# Patient Record
Sex: Female | Born: 1970 | Race: White | Hispanic: No | Smoking: Former smoker
Health system: Southern US, Community
[De-identification: ages and names within clinical notes are randomized; demographics above are authoritative.]

## PROBLEM LIST (undated history)

## (undated) DIAGNOSIS — Z8709 Personal history of other diseases of the respiratory system: Secondary | ICD-10-CM

## (undated) DIAGNOSIS — J309 Allergic rhinitis, unspecified: Secondary | ICD-10-CM

## (undated) DIAGNOSIS — E162 Hypoglycemia, unspecified: Secondary | ICD-10-CM

## (undated) DIAGNOSIS — L309 Dermatitis, unspecified: Secondary | ICD-10-CM

## (undated) DIAGNOSIS — M4310 Spondylolisthesis, site unspecified: Secondary | ICD-10-CM

## (undated) DIAGNOSIS — M1612 Unilateral primary osteoarthritis, left hip: Secondary | ICD-10-CM

## (undated) DIAGNOSIS — M2550 Pain in unspecified joint: Secondary | ICD-10-CM

## (undated) DIAGNOSIS — G4700 Insomnia, unspecified: Secondary | ICD-10-CM

## (undated) DIAGNOSIS — L405 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, unspecified: Secondary | ICD-10-CM

## (undated) DIAGNOSIS — T7840XA Allergy, unspecified, initial encounter: Secondary | ICD-10-CM

## (undated) DIAGNOSIS — M1711 Unilateral primary osteoarthritis, right knee: Secondary | ICD-10-CM

## (undated) DIAGNOSIS — M48061 Spinal stenosis, lumbar region without neurogenic claudication: Secondary | ICD-10-CM

## (undated) DIAGNOSIS — Z973 Presence of spectacles and contact lenses: Secondary | ICD-10-CM

## (undated) DIAGNOSIS — G8929 Other chronic pain: Secondary | ICD-10-CM

## (undated) DIAGNOSIS — F32A Depression, unspecified: Secondary | ICD-10-CM

## (undated) DIAGNOSIS — F410 Panic disorder [episodic paroxysmal anxiety] without agoraphobia: Secondary | ICD-10-CM

## (undated) DIAGNOSIS — S32009K Unspecified fracture of unspecified lumbar vertebra, subsequent encounter for fracture with nonunion: Secondary | ICD-10-CM

## (undated) DIAGNOSIS — I700 Atherosclerosis of aorta: Secondary | ICD-10-CM

## (undated) DIAGNOSIS — Z981 Arthrodesis status: Secondary | ICD-10-CM

## (undated) DIAGNOSIS — J302 Other seasonal allergic rhinitis: Secondary | ICD-10-CM

## (undated) DIAGNOSIS — F332 Major depressive disorder, recurrent severe without psychotic features: Secondary | ICD-10-CM

## (undated) DIAGNOSIS — M51369 Other intervertebral disc degeneration, lumbar region without mention of lumbar back pain or lower extremity pain: Secondary | ICD-10-CM

## (undated) DIAGNOSIS — F329 Major depressive disorder, single episode, unspecified: Secondary | ICD-10-CM

## (undated) DIAGNOSIS — F419 Anxiety disorder, unspecified: Secondary | ICD-10-CM

## (undated) DIAGNOSIS — F411 Generalized anxiety disorder: Secondary | ICD-10-CM

## (undated) DIAGNOSIS — Z9189 Other specified personal risk factors, not elsewhere classified: Secondary | ICD-10-CM

## (undated) DIAGNOSIS — M75100 Unspecified rotator cuff tear or rupture of unspecified shoulder, not specified as traumatic: Secondary | ICD-10-CM

## (undated) DIAGNOSIS — R112 Nausea with vomiting, unspecified: Secondary | ICD-10-CM

## (undated) DIAGNOSIS — M5126 Other intervertebral disc displacement, lumbar region: Secondary | ICD-10-CM

## (undated) DIAGNOSIS — E559 Vitamin D deficiency, unspecified: Secondary | ICD-10-CM

## (undated) DIAGNOSIS — M1990 Unspecified osteoarthritis, unspecified site: Secondary | ICD-10-CM

## (undated) DIAGNOSIS — Z8489 Family history of other specified conditions: Secondary | ICD-10-CM

## (undated) DIAGNOSIS — M5136 Other intervertebral disc degeneration, lumbar region: Secondary | ICD-10-CM

## (undated) DIAGNOSIS — R52 Pain, unspecified: Secondary | ICD-10-CM

## (undated) DIAGNOSIS — M549 Dorsalgia, unspecified: Secondary | ICD-10-CM

## (undated) DIAGNOSIS — Z9889 Other specified postprocedural states: Secondary | ICD-10-CM

## (undated) HISTORY — DX: Other intervertebral disc degeneration, lumbar region: M51.36

## (undated) HISTORY — DX: Allergy, unspecified, initial encounter: T78.40XA

## (undated) HISTORY — PX: BACK SURGERY: SHX140

## (undated) HISTORY — DX: Unspecified rotator cuff tear or rupture of unspecified shoulder, not specified as traumatic: M75.100

## (undated) MED FILL — Medication: Fill #0 | Status: CN

---

## 2013-03-29 ENCOUNTER — Emergency Department: Payer: Self-pay | Admitting: Emergency Medicine

## 2014-07-29 ENCOUNTER — Ambulatory Visit: Payer: Self-pay | Admitting: Neurosurgery

## 2014-08-03 ENCOUNTER — Other Ambulatory Visit (HOSPITAL_COMMUNITY): Payer: Self-pay | Admitting: Neurosurgery

## 2014-08-03 DIAGNOSIS — M4310 Spondylolisthesis, site unspecified: Secondary | ICD-10-CM | POA: Insufficient documentation

## 2014-08-03 DIAGNOSIS — M5126 Other intervertebral disc displacement, lumbar region: Secondary | ICD-10-CM | POA: Insufficient documentation

## 2014-08-26 ENCOUNTER — Encounter (HOSPITAL_COMMUNITY)
Admission: RE | Admit: 2014-08-26 | Discharge: 2014-08-26 | Disposition: A | Discharge status: Home / Self Care | Payer: BLUE CROSS/BLUE SHIELD | Source: Ambulatory Visit | Attending: Hospitalist | Admitting: Hospitalist

## 2014-08-26 ENCOUNTER — Encounter (HOSPITAL_COMMUNITY): Payer: Self-pay

## 2014-08-26 DIAGNOSIS — Z01812 Encounter for preprocedural laboratory examination: Secondary | ICD-10-CM | POA: Diagnosis present

## 2014-08-26 HISTORY — DX: Major depressive disorder, single episode, unspecified: F32.9

## 2014-08-26 HISTORY — DX: Family history of other specified conditions: Z84.89

## 2014-08-26 HISTORY — DX: Hypoglycemia, unspecified: E16.2

## 2014-08-26 HISTORY — DX: Unspecified osteoarthritis, unspecified site: M19.90

## 2014-08-26 HISTORY — DX: Nausea with vomiting, unspecified: R11.2

## 2014-08-26 HISTORY — DX: Insomnia, unspecified: G47.00

## 2014-08-26 HISTORY — DX: Personal history of other diseases of the respiratory system: Z87.09

## 2014-08-26 HISTORY — DX: Vitamin D deficiency, unspecified: E55.9

## 2014-08-26 HISTORY — DX: Dermatitis, unspecified: L30.9

## 2014-08-26 HISTORY — DX: Pain in unspecified joint: M25.50

## 2014-08-26 HISTORY — DX: Anxiety disorder, unspecified: F41.9

## 2014-08-26 HISTORY — DX: Other chronic pain: G89.29

## 2014-08-26 HISTORY — DX: Other seasonal allergic rhinitis: J30.2

## 2014-08-26 HISTORY — DX: Depression, unspecified: F32.A

## 2014-08-26 HISTORY — DX: Other specified postprocedural states: Z98.890

## 2014-08-26 HISTORY — DX: Dorsalgia, unspecified: M54.9

## 2014-08-26 LAB — CBC
HEMATOCRIT: 40.1 % (ref 36.0–46.0)
HEMOGLOBIN: 13.3 g/dL (ref 12.0–15.0)
MCH: 31.9 pg (ref 26.0–34.0)
MCHC: 33.2 g/dL (ref 30.0–36.0)
MCV: 96.2 fL (ref 78.0–100.0)
Platelets: 213 10*3/uL (ref 150–400)
RBC: 4.17 MIL/uL (ref 3.87–5.11)
RDW: 13 % (ref 11.5–15.5)
WBC: 7.9 10*3/uL (ref 4.0–10.5)

## 2014-08-26 LAB — BASIC METABOLIC PANEL
ANION GAP: 8 (ref 5–15)
BUN: 6 mg/dL (ref 6–23)
CHLORIDE: 107 meq/L (ref 96–112)
CO2: 27 mmol/L (ref 19–32)
Calcium: 9.5 mg/dL (ref 8.4–10.5)
Creatinine, Ser: 0.72 mg/dL (ref 0.50–1.10)
GFR calc Af Amer: >90 mL/min (ref >90)
GFR calc non Af Amer: >90 mL/min (ref >90)
Glucose, Bld: 115 mg/dL — ABNORMAL HIGH (ref 70–99)
Potassium: 4.4 mmol/L (ref 3.5–5.1)
Sodium: 142 mmol/L (ref 135–145)

## 2014-08-26 LAB — TYPE AND SCREEN
ABO/RH(D): A POS
ANTIBODY SCREEN: NEGATIVE

## 2014-08-26 LAB — SURGICAL PCR SCREEN
MRSA, PCR: NEGATIVE
STAPHYLOCOCCUS AUREUS: POSITIVE — AB

## 2014-08-26 LAB — HCG, SERUM, QUALITATIVE: PREG SERUM: NEGATIVE

## 2014-08-26 LAB — ABO/RH: ABO/RH(D): A POS

## 2014-08-26 NOTE — Progress Notes (Signed)
Pt doesn't have a cardiologist  Denies ever having an echo/stress test/heart cath  Denies EKG or CXR in past yr  Medical MD is Dr.Mark Jeananne Rama

## 2014-08-26 NOTE — Pre-Procedure Instructions (Signed)
Jamie [name]  08/26/2014   Your procedure is scheduled on:  Wed, Jan 13 @ 9:30 AM  Report to [facility]  at  6:30 AM  Call this number if you have problems the morning of surgery: [phone]   Remember:   Do not eat food or drink liquids after midnight.   Take these medicines the morning of surgery with A SIP OF WATER: Baclofen(Lioresal),Flonase(Fluticasone),Gabapentin(Neurontin),Ativan(Lorazepam),Pain Pill(if needed),and Effexor(Venlafaxine)               No Goody's,BC's,Aleve,Aspirin,Ibuprofen,Fish Oil,or any Herbal Medications   Do not wear jewelry, make-up or nail polish.  Do not wear lotions, powders, or perfumes. You may wear deodorant.  Do not shave 48 hours prior to surgery.   Do not bring valuables to the hospital.  [facility] is not responsible                  for any belongings or valuables.               Contacts, dentures or bridgework may not be worn into surgery.  Leave suitcase in the car. After surgery it may be brought to your room.  For patients admitted to the hospital, discharge time is determined by your                treatment team.                   Special Instructions:  [facility] - Preparing for Surgery  Before surgery, you can play an important role.  Because skin is not sterile, your skin needs to be as free of germs as possible.  You can reduce the number of germs on you skin by washing with CHG (chlorahexidine gluconate) soap before surgery.  CHG is an antiseptic cleaner which kills germs and bonds with the skin to continue killing germs even after washing.  Please DO NOT use if you have an allergy to CHG or antibacterial soaps.  If your skin becomes reddened/irritated stop using the CHG and inform your nurse when you arrive at Short Stay.  Do not shave (including legs and underarms) for at least 48 hours prior to the first CHG shower.  You may shave your face.  Please follow these instructions carefully:   1.  Shower with CHG Soap  the night before surgery and the                                morning of Surgery.  2.  If you choose to wash your hair, wash your hair first as usual with your       normal shampoo.  3.  After you shampoo, rinse your hair and body thoroughly to remove the                      Shampoo.  4.  Use CHG as you would any other liquid soap.  You can apply chg directly       to the skin and wash gently with scrungie or a clean washcloth.  5.  Apply the CHG Soap to your body ONLY FROM THE NECK DOWN.        Do not use on open wounds or open sores.  Avoid contact with your eyes,       ears, mouth and genitals (private parts).  Wash genitals (private parts)  with your normal soap.  6.  Wash thoroughly, paying special attention to the area where your surgery        will be performed.  7.  Thoroughly rinse your body with warm water from the neck down.  8.  DO NOT shower/wash with your normal soap after using and rinsing off       the CHG Soap.  9.  Pat yourself dry with a clean towel.            10.  Wear clean pajamas.            11.  Place clean sheets on your bed the night of your first shower and do not        sleep with pets.  Day of Surgery  Do not apply any lotions/deoderants the morning of surgery.  Please wear clean clothes to the hospital/surgery center.     Please read over the following fact sheets that you were given: Pain Booklet, Coughing and Deep Breathing, Blood Transfusion Information, MRSA Information and Surgical Site Infection Prevention

## 2014-08-31 MED ORDER — CEFAZOLIN SODIUM-DEXTROSE 2-3 GM-% IV SOLR
2.0000 g | INTRAVENOUS | Status: AC
  Administered 2014-09-01 (×2): 2 g via INTRAVENOUS
  Filled 2014-08-31: qty 50

## 2014-08-31 NOTE — Progress Notes (Signed)
Patient notified to arrive at 06:30 verbalized understanding.

## 2014-09-01 ENCOUNTER — Encounter (HOSPITAL_COMMUNITY)
Admission: RE | Disposition: A | Discharge status: Home / Self Care | Payer: BLUE CROSS/BLUE SHIELD | Source: Ambulatory Visit | Attending: Neurosurgery

## 2014-09-01 ENCOUNTER — Inpatient Hospital Stay (HOSPITAL_COMMUNITY): Payer: BLUE CROSS/BLUE SHIELD | Admitting: Anesthesiology

## 2014-09-01 ENCOUNTER — Encounter (HOSPITAL_COMMUNITY): Payer: Self-pay | Admitting: *Deleted

## 2014-09-01 ENCOUNTER — Inpatient Hospital Stay (HOSPITAL_COMMUNITY)
Admission: RE | Admit: 2014-09-01 | Discharge: 2014-09-05 | DRG: 460 | Disposition: A | Discharge status: Home / Self Care | Payer: BLUE CROSS/BLUE SHIELD | Source: Ambulatory Visit | Attending: Neurosurgery | Admitting: Neurosurgery

## 2014-09-01 ENCOUNTER — Inpatient Hospital Stay (HOSPITAL_COMMUNITY): Payer: BLUE CROSS/BLUE SHIELD

## 2014-09-01 DIAGNOSIS — R51 Headache: Secondary | ICD-10-CM | POA: Diagnosis not present

## 2014-09-01 DIAGNOSIS — Z888 Allergy status to other drugs, medicaments and biological substances status: Secondary | ICD-10-CM

## 2014-09-01 DIAGNOSIS — L309 Dermatitis, unspecified: Secondary | ICD-10-CM | POA: Diagnosis present

## 2014-09-01 DIAGNOSIS — I959 Hypotension, unspecified: Secondary | ICD-10-CM | POA: Diagnosis not present

## 2014-09-01 DIAGNOSIS — E559 Vitamin D deficiency, unspecified: Secondary | ICD-10-CM | POA: Diagnosis present

## 2014-09-01 DIAGNOSIS — J302 Other seasonal allergic rhinitis: Secondary | ICD-10-CM | POA: Diagnosis present

## 2014-09-01 DIAGNOSIS — M4807 Spinal stenosis, lumbosacral region: Secondary | ICD-10-CM | POA: Diagnosis present

## 2014-09-01 DIAGNOSIS — M48061 Spinal stenosis, lumbar region without neurogenic claudication: Secondary | ICD-10-CM | POA: Diagnosis present

## 2014-09-01 DIAGNOSIS — F329 Major depressive disorder, single episode, unspecified: Secondary | ICD-10-CM | POA: Diagnosis present

## 2014-09-01 DIAGNOSIS — Z981 Arthrodesis status: Secondary | ICD-10-CM

## 2014-09-01 DIAGNOSIS — M5137 Other intervertebral disc degeneration, lumbosacral region: Secondary | ICD-10-CM | POA: Diagnosis present

## 2014-09-01 DIAGNOSIS — F419 Anxiety disorder, unspecified: Secondary | ICD-10-CM | POA: Diagnosis present

## 2014-09-01 DIAGNOSIS — Z91013 Allergy to seafood: Secondary | ICD-10-CM

## 2014-09-01 DIAGNOSIS — M4727 Other spondylosis with radiculopathy, lumbosacral region: Secondary | ICD-10-CM | POA: Diagnosis present

## 2014-09-01 DIAGNOSIS — M4187 Other forms of scoliosis, lumbosacral region: Secondary | ICD-10-CM | POA: Diagnosis present

## 2014-09-01 DIAGNOSIS — Z79899 Other long term (current) drug therapy: Secondary | ICD-10-CM | POA: Diagnosis not present

## 2014-09-01 DIAGNOSIS — M5136 Other intervertebral disc degeneration, lumbar region: Secondary | ICD-10-CM | POA: Diagnosis present

## 2014-09-01 DIAGNOSIS — Z720 Tobacco use: Secondary | ICD-10-CM | POA: Diagnosis not present

## 2014-09-01 DIAGNOSIS — G4700 Insomnia, unspecified: Secondary | ICD-10-CM | POA: Diagnosis present

## 2014-09-01 DIAGNOSIS — Z419 Encounter for procedure for purposes other than remedying health state, unspecified: Secondary | ICD-10-CM

## 2014-09-01 HISTORY — PX: MAXIMUM ACCESS (MAS)POSTERIOR LUMBAR INTERBODY FUSION (PLIF) 2 LEVEL: SHX6369

## 2014-09-01 SURGERY — FOR MAXIMUM ACCESS (MAS) POSTERIOR LUMBAR INTERBODY FUSION (PLIF) 2 LEVEL
Anesthesia: General | Site: Back

## 2014-09-01 MED ORDER — ACETAMINOPHEN 325 MG PO TABS
650.0000 mg | ORAL_TABLET | ORAL | Status: DC | PRN
  Administered 2014-09-03 – 2014-09-04 (×3): 650 mg via ORAL
  Filled 2014-09-01 (×3): qty 2

## 2014-09-01 MED ORDER — THROMBIN 20000 UNITS EX SOLR
CUTANEOUS | Status: DC | PRN
  Administered 2014-09-01 (×2): via TOPICAL

## 2014-09-01 MED ORDER — GLYCOPYRROLATE 0.2 MG/ML IJ SOLN
INTRAMUSCULAR | Status: AC
  Filled 2014-09-01: qty 3

## 2014-09-01 MED ORDER — ALBUMIN HUMAN 5 % IV SOLN
INTRAVENOUS | Status: DC | PRN
  Administered 2014-09-01: 11:00:00 via INTRAVENOUS

## 2014-09-01 MED ORDER — LIDOCAINE HCL (CARDIAC) 20 MG/ML IV SOLN
INTRAVENOUS | Status: AC
  Filled 2014-09-01: qty 5

## 2014-09-01 MED ORDER — HYDROMORPHONE HCL 1 MG/ML IJ SOLN
INTRAMUSCULAR | Status: AC
  Filled 2014-09-01: qty 1

## 2014-09-01 MED ORDER — MIDAZOLAM HCL 5 MG/5ML IJ SOLN
INTRAMUSCULAR | Status: DC | PRN
  Administered 2014-09-01: 2 mg via INTRAVENOUS

## 2014-09-01 MED ORDER — FENTANYL CITRATE 0.05 MG/ML IJ SOLN
INTRAMUSCULAR | Status: DC | PRN
  Administered 2014-09-01 (×2): 50 ug via INTRAVENOUS
  Administered 2014-09-01 (×2): 100 ug via INTRAVENOUS
  Administered 2014-09-01: 50 ug via INTRAVENOUS
  Administered 2014-09-01: 100 ug via INTRAVENOUS
  Administered 2014-09-01: 50 ug via INTRAVENOUS

## 2014-09-01 MED ORDER — CYCLOBENZAPRINE HCL 10 MG PO TABS: 10.0000 mg | ORAL_TABLET | Freq: Three times a day (TID) | ORAL | Status: DC | PRN

## 2014-09-01 MED ORDER — HYDROMORPHONE HCL 1 MG/ML IJ SOLN
0.2500 mg | INTRAMUSCULAR | Status: DC | PRN
  Administered 2014-09-01 (×4): 0.5 mg via INTRAVENOUS

## 2014-09-01 MED ORDER — NEOSTIGMINE METHYLSULFATE 10 MG/10ML IV SOLN
INTRAVENOUS | Status: AC
  Filled 2014-09-01: qty 1

## 2014-09-01 MED ORDER — HYDROMORPHONE HCL 1 MG/ML IJ SOLN
0.5000 mg | INTRAMUSCULAR | Status: DC | PRN
  Administered 2014-09-01 – 2014-09-04 (×12): 1 mg via INTRAVENOUS
  Filled 2014-09-01 (×12): qty 1

## 2014-09-01 MED ORDER — THROMBIN 5000 UNITS EX SOLR
CUTANEOUS | Status: DC | PRN
  Administered 2014-09-01: 10:00:00 via TOPICAL

## 2014-09-01 MED ORDER — OXYCODONE-ACETAMINOPHEN 5-325 MG PO TABS
1.0000 | ORAL_TABLET | ORAL | Status: DC | PRN
  Administered 2014-09-01 – 2014-09-05 (×5): 2 via ORAL
  Filled 2014-09-01 (×8): qty 2

## 2014-09-01 MED ORDER — GABAPENTIN 800 MG PO TABS
800.0000 mg | ORAL_TABLET | Freq: Three times a day (TID) | ORAL | Status: DC
  Filled 2014-09-01 (×2): qty 1

## 2014-09-01 MED ORDER — GABAPENTIN 400 MG PO CAPS
800.0000 mg | ORAL_CAPSULE | Freq: Three times a day (TID) | ORAL | Status: DC
  Administered 2014-09-01 – 2014-09-05 (×12): 800 mg via ORAL
  Filled 2014-09-01 (×12): qty 2

## 2014-09-01 MED ORDER — ROCURONIUM BROMIDE 100 MG/10ML IV SOLN
INTRAVENOUS | Status: DC | PRN
  Administered 2014-09-01: 5 mg via INTRAVENOUS
  Administered 2014-09-01: 50 mg via INTRAVENOUS
  Administered 2014-09-01 (×2): 5 mg via INTRAVENOUS
  Administered 2014-09-01: 10 mg via INTRAVENOUS
  Administered 2014-09-01: 5 mg via INTRAVENOUS

## 2014-09-01 MED ORDER — ROCURONIUM BROMIDE 50 MG/5ML IV SOLN
INTRAVENOUS | Status: AC
  Filled 2014-09-01: qty 1

## 2014-09-01 MED ORDER — LIDOCAINE-EPINEPHRINE 1 %-1:100000 IJ SOLN
INTRAMUSCULAR | Status: DC | PRN
Start: 2014-09-01 — End: 2014-09-01
  Administered 2014-09-01: 7 mL

## 2014-09-01 MED ORDER — PROPOFOL 10 MG/ML IV BOLUS
INTRAVENOUS | Status: DC | PRN
  Administered 2014-09-01: 200 mg via INTRAVENOUS

## 2014-09-01 MED ORDER — SODIUM CHLORIDE 0.9 % IR SOLN
Status: DC | PRN
  Administered 2014-09-01: 10:00:00

## 2014-09-01 MED ORDER — MORPHINE SULFATE 30 MG PO TABS: 30.0000 mg | ORAL_TABLET | Freq: Two times a day (BID) | ORAL | Status: DC

## 2014-09-01 MED ORDER — CYCLOBENZAPRINE HCL 10 MG PO TABS
ORAL_TABLET | ORAL | Status: AC
  Administered 2014-09-01: 10 mg
  Filled 2014-09-01: qty 1

## 2014-09-01 MED ORDER — OXYCODONE HCL 5 MG PO TABS
15.0000 mg | ORAL_TABLET | ORAL | Status: DC | PRN
  Administered 2014-09-02 – 2014-09-04 (×6): 15 mg via ORAL
  Filled 2014-09-01 (×6): qty 3

## 2014-09-01 MED ORDER — ONDANSETRON HCL 4 MG/2ML IJ SOLN
INTRAMUSCULAR | Status: DC | PRN
  Administered 2014-09-01: 4 mg via INTRAVENOUS

## 2014-09-01 MED ORDER — PNEUMOCOCCAL VAC POLYVALENT 25 MCG/0.5ML IJ INJ: 0.5000 mL | INJECTION | INTRAMUSCULAR | Status: DC | PRN

## 2014-09-01 MED ORDER — MORPHINE SULFATE 15 MG PO TABS
30.0000 mg | ORAL_TABLET | Freq: Two times a day (BID) | ORAL | Status: DC
Start: 2014-09-01 — End: 2014-09-05
  Administered 2014-09-01 – 2014-09-05 (×8): 30 mg via ORAL
  Filled 2014-09-01 (×8): qty 2

## 2014-09-01 MED ORDER — CEFAZOLIN SODIUM 1-5 GM-% IV SOLN
1.0000 g | Freq: Three times a day (TID) | INTRAVENOUS | Status: AC
  Administered 2014-09-01 – 2014-09-04 (×8): 1 g via INTRAVENOUS
  Filled 2014-09-01 (×8): qty 50

## 2014-09-01 MED ORDER — LIDOCAINE HCL (CARDIAC) 20 MG/ML IV SOLN
INTRAVENOUS | Status: DC | PRN
  Administered 2014-09-01: 60 mg via INTRAVENOUS

## 2014-09-01 MED ORDER — OXYCODONE HCL 5 MG PO TABS
15.0000 mg | ORAL_TABLET | ORAL | Status: DC | PRN
  Administered 2014-09-01: 15 mg via ORAL

## 2014-09-01 MED ORDER — FLUTICASONE PROPIONATE 50 MCG/ACT NA SUSP
1.0000 | Freq: Every day | NASAL | Status: DC
  Administered 2014-09-01 – 2014-09-03 (×3): 1 via NASAL
  Filled 2014-09-01: qty 16

## 2014-09-01 MED ORDER — OXYCODONE HCL 5 MG PO TABS
ORAL_TABLET | ORAL | Status: AC
  Filled 2014-09-01: qty 3

## 2014-09-01 MED ORDER — MENTHOL 3 MG MT LOZG: 1.0000 | LOZENGE | OROMUCOSAL | Status: DC | PRN

## 2014-09-01 MED ORDER — NEOSTIGMINE METHYLSULFATE 10 MG/10ML IV SOLN
INTRAVENOUS | Status: DC | PRN
  Administered 2014-09-01: 4 mg via INTRAVENOUS

## 2014-09-01 MED ORDER — PHENOL 1.4 % MT LIQD: 1.0000 | OROMUCOSAL | Status: DC | PRN

## 2014-09-01 MED ORDER — PROPOFOL 10 MG/ML IV BOLUS
INTRAVENOUS | Status: AC
  Filled 2014-09-01: qty 20

## 2014-09-01 MED ORDER — SODIUM CHLORIDE 0.9 % IJ SOLN
3.0000 mL | INTRAMUSCULAR | Status: DC | PRN
Start: 2014-09-01 — End: 2014-09-05

## 2014-09-01 MED ORDER — 0.9 % SODIUM CHLORIDE (POUR BTL) OPTIME
TOPICAL | Status: DC | PRN
  Administered 2014-09-01: 1000 mL

## 2014-09-01 MED ORDER — SODIUM CHLORIDE 0.9 % IV SOLN
10.0000 mg | INTRAVENOUS | Status: DC | PRN
  Administered 2014-09-01: 25 ug/min via INTRAVENOUS

## 2014-09-01 MED ORDER — GLYCOPYRROLATE 0.2 MG/ML IJ SOLN
INTRAMUSCULAR | Status: DC | PRN
  Administered 2014-09-01: .6 mg via INTRAVENOUS

## 2014-09-01 MED ORDER — OXYCODONE-ACETAMINOPHEN 5-325 MG PO TABS: 1.0000 | ORAL_TABLET | ORAL | Status: DC | PRN

## 2014-09-01 MED ORDER — SODIUM CHLORIDE 0.9 % IV SOLN
250.0000 mL | INTRAVENOUS | Status: DC
  Administered 2014-09-01 – 2014-09-04 (×4): 250 mL via INTRAVENOUS

## 2014-09-01 MED ORDER — MIDAZOLAM HCL 2 MG/2ML IJ SOLN
INTRAMUSCULAR | Status: AC
  Filled 2014-09-01: qty 2

## 2014-09-01 MED ORDER — ONDANSETRON HCL 4 MG/2ML IJ SOLN: 4.0000 mg | INTRAMUSCULAR | Status: DC | PRN

## 2014-09-01 MED ORDER — SODIUM CHLORIDE 0.9 % IJ SOLN
3.0000 mL | Freq: Two times a day (BID) | INTRAMUSCULAR | Status: DC
  Administered 2014-09-01 – 2014-09-03 (×4): 3 mL via INTRAVENOUS

## 2014-09-01 MED ORDER — ONDANSETRON HCL 4 MG/2ML IJ SOLN
INTRAMUSCULAR | Status: AC
  Filled 2014-09-01: qty 2

## 2014-09-01 MED ORDER — VENLAFAXINE HCL ER 75 MG PO CP24
225.0000 mg | ORAL_CAPSULE | Freq: Every day | ORAL | Status: DC
  Administered 2014-09-02 – 2014-09-05 (×4): 225 mg via ORAL
  Filled 2014-09-01 (×4): qty 3

## 2014-09-01 MED ORDER — SCOPOLAMINE 1 MG/3DAYS TD PT72
MEDICATED_PATCH | TRANSDERMAL | Status: AC
  Administered 2014-09-01: 1 via TRANSDERMAL
  Filled 2014-09-01: qty 1

## 2014-09-01 MED ORDER — ONDANSETRON HCL 4 MG/2ML IJ SOLN: 4.0000 mg | Freq: Four times a day (QID) | INTRAMUSCULAR | Status: DC | PRN

## 2014-09-01 MED ORDER — FENTANYL CITRATE 0.05 MG/ML IJ SOLN
INTRAMUSCULAR | Status: AC
  Filled 2014-09-01: qty 5

## 2014-09-01 MED ORDER — BACLOFEN 10 MG PO TABS
10.0000 mg | ORAL_TABLET | Freq: Three times a day (TID) | ORAL | Status: DC
  Administered 2014-09-01 – 2014-09-05 (×12): 10 mg via ORAL
  Filled 2014-09-01 (×12): qty 1

## 2014-09-01 MED ORDER — OXYCODONE HCL 5 MG PO TABS: 15.0000 mg | ORAL_TABLET | ORAL | Status: DC | PRN

## 2014-09-01 MED ORDER — VITAMIN D 1000 UNITS PO TABS
1000.0000 [IU] | ORAL_TABLET | Freq: Every day | ORAL | Status: DC
  Administered 2014-09-01 – 2014-09-05 (×5): 1000 [IU] via ORAL
  Filled 2014-09-01 (×5): qty 1

## 2014-09-01 MED ORDER — LORAZEPAM 0.5 MG PO TABS
0.5000 mg | ORAL_TABLET | Freq: Two times a day (BID) | ORAL | Status: DC | PRN
  Administered 2014-09-02 – 2014-09-04 (×3): 0.5 mg via ORAL
  Filled 2014-09-01 (×3): qty 1

## 2014-09-01 MED ORDER — BUPIVACAINE HCL (PF) 0.25 % IJ SOLN
INTRAMUSCULAR | Status: DC | PRN
  Administered 2014-09-01: 9 mL

## 2014-09-01 MED ORDER — ACETAMINOPHEN 650 MG RE SUPP: 650.0000 mg | RECTAL | Status: DC | PRN

## 2014-09-01 MED ORDER — HYDROMORPHONE HCL 1 MG/ML IJ SOLN
0.5000 mg | INTRAMUSCULAR | Status: DC | PRN
  Administered 2014-09-01 (×2): 0.5 mg via INTRAVENOUS

## 2014-09-01 MED ORDER — VENLAFAXINE HCL 37.5 MG PO TABS: 75.0000 mg | ORAL_TABLET | Freq: Every day | ORAL | Status: DC

## 2014-09-01 MED ORDER — DOCUSATE SODIUM 100 MG PO CAPS
100.0000 mg | ORAL_CAPSULE | Freq: Two times a day (BID) | ORAL | Status: DC
Start: 2014-09-01 — End: 2014-09-05
  Administered 2014-09-01 – 2014-09-05 (×9): 100 mg via ORAL
  Filled 2014-09-01 (×9): qty 1

## 2014-09-01 MED ORDER — LACTATED RINGERS IV SOLN
INTRAVENOUS | Status: DC | PRN
  Administered 2014-09-01 (×3): via INTRAVENOUS

## 2014-09-01 SURGICAL SUPPLY — 73 items
ALLOSTEM STRIP 20MMX50MM (Tissue) ×2 IMPLANT
BAG DECANTER FOR FLEXI CONT (MISCELLANEOUS) ×2 IMPLANT
BENZOIN TINCTURE PRP APPL 2/3 (GAUZE/BANDAGES/DRESSINGS) ×2 IMPLANT
BLADE CLIPPER SURG (BLADE) IMPLANT
BLADE SURG 11 STRL SS (BLADE) ×2 IMPLANT
BONE ALLOSTEM MORSELIZED 5CC (Bone Implant) ×2 IMPLANT
BUR MATCHSTICK NEURO 3.0 LAGG (BURR) ×2 IMPLANT
CANISTER SUCT 3000ML (MISCELLANEOUS) ×2 IMPLANT
CAP LOCKING (Cap) ×6 IMPLANT
CAP LOCKING 5.5 CREO (Cap) ×6 IMPLANT
CONT SPEC 4OZ CLIKSEAL STRL BL (MISCELLANEOUS) ×4 IMPLANT
COVER BACK TABLE 24X17X13 BIG (DRAPES) IMPLANT
COVER BACK TABLE 60X90IN (DRAPES) ×2 IMPLANT
DRAPE C-ARM 42X72 X-RAY (DRAPES) ×2 IMPLANT
DRAPE C-ARMOR (DRAPES) ×2 IMPLANT
DRAPE LAPAROTOMY 100X72X124 (DRAPES) ×2 IMPLANT
DRAPE POUCH INSTRU U-SHP 10X18 (DRAPES) ×2 IMPLANT
DRAPE SURG 17X23 STRL (DRAPES) ×2 IMPLANT
DRILL BIT ×2 IMPLANT
DRSG OPSITE 4X5.5 SM (GAUZE/BANDAGES/DRESSINGS) ×2 IMPLANT
DRSG OPSITE POSTOP 4X6 (GAUZE/BANDAGES/DRESSINGS) ×2 IMPLANT
DRSG TELFA 3X8 NADH (GAUZE/BANDAGES/DRESSINGS) IMPLANT
DURAPREP 26ML APPLICATOR (WOUND CARE) ×2 IMPLANT
ELECT REM PT RETURN 9FT ADLT (ELECTROSURGICAL) ×2
ELECTRODE REM PT RTRN 9FT ADLT (ELECTROSURGICAL) ×1 IMPLANT
EVACUATOR 1/8 PVC DRAIN (DRAIN) IMPLANT
EVACUATOR 3/16  PVC DRAIN (DRAIN) ×1
EVACUATOR 3/16 PVC DRAIN (DRAIN) ×1 IMPLANT
GAUZE SPONGE 4X4 12PLY STRL (GAUZE/BANDAGES/DRESSINGS) ×2 IMPLANT
GAUZE SPONGE 4X4 16PLY XRAY LF (GAUZE/BANDAGES/DRESSINGS) IMPLANT
GLOVE BIO SURGEON STRL SZ8 (GLOVE) ×4 IMPLANT
GLOVE BIOGEL PI IND STRL 7.5 (GLOVE) ×3 IMPLANT
GLOVE BIOGEL PI INDICATOR 7.5 (GLOVE) ×3
GLOVE ECLIPSE 6.5 STRL STRAW (GLOVE) ×2 IMPLANT
GLOVE INDICATOR 8.5 STRL (GLOVE) ×4 IMPLANT
GLOVE SURG SS PI 7.0 STRL IVOR (GLOVE) ×6 IMPLANT
GOWN STRL REUS W/ TWL LRG LVL3 (GOWN DISPOSABLE) ×3 IMPLANT
GOWN STRL REUS W/ TWL XL LVL3 (GOWN DISPOSABLE) ×2 IMPLANT
GOWN STRL REUS W/TWL 2XL LVL3 (GOWN DISPOSABLE) IMPLANT
GOWN STRL REUS W/TWL LRG LVL3 (GOWN DISPOSABLE) ×3
GOWN STRL REUS W/TWL XL LVL3 (GOWN DISPOSABLE) ×2
HEMOSTAT POWDER KIT SURGIFOAM (HEMOSTASIS) ×2 IMPLANT
IMPL SPINE MCS 6.5 5.5X35 (Neuro Prosthesis/Implant) ×2 IMPLANT
IMPLANT SPINE MCS 6.5 5.5X35 (Neuro Prosthesis/Implant) ×4 IMPLANT
KIT BASIN OR (CUSTOM PROCEDURE TRAY) ×2 IMPLANT
KIT ROOM TURNOVER OR (KITS) ×2 IMPLANT
LIQUID BAND (GAUZE/BANDAGES/DRESSINGS) ×2 IMPLANT
MILL MEDIUM DISP (BLADE) ×2 IMPLANT
NEEDLE HYPO 25X1 1.5 SAFETY (NEEDLE) ×2 IMPLANT
NS IRRIG 1000ML POUR BTL (IV SOLUTION) ×2 IMPLANT
PACK LAMINECTOMY NEURO (CUSTOM PROCEDURE TRAY) ×2 IMPLANT
PAD ARMBOARD 7.5X6 YLW CONV (MISCELLANEOUS) ×6 IMPLANT
PATTIES SURGICAL 1X1 (DISPOSABLE) ×2 IMPLANT
ROD 70MM SPINAL (Rod) ×4 IMPLANT
SCREW 6.5X5.5 30MM (Screw) ×2 IMPLANT
SCREW MOD 6.0-5.0X35MM (Screw) ×4 IMPLANT
SCREW PREASSEMBLY 6.0-5.0X35 (Screw) ×4 IMPLANT
SPACER RISE 10X22 8-14MM-10 (Spacer) ×4 IMPLANT
SPACER RISE 7-13MM 10X22 (Spacer) ×4 IMPLANT
SPONGE LAP 4X18 X RAY DECT (DISPOSABLE) IMPLANT
SPONGE SURGIFOAM ABS GEL 100 (HEMOSTASIS) ×4 IMPLANT
STRIP CLOSURE SKIN 1/2X4 (GAUZE/BANDAGES/DRESSINGS) ×2 IMPLANT
SUT VIC AB 0 CT1 18XCR BRD8 (SUTURE) ×1 IMPLANT
SUT VIC AB 0 CT1 8-18 (SUTURE) ×1
SUT VIC AB 2-0 CT1 18 (SUTURE) ×2 IMPLANT
SUT VICRYL 4-0 PS2 18IN ABS (SUTURE) ×2 IMPLANT
SYR 20ML ECCENTRIC (SYRINGE) ×2 IMPLANT
TAP BONE CORT 5.0/4.0 (BIT) ×2 IMPLANT
TOWEL OR 17X24 6PK STRL BLUE (TOWEL DISPOSABLE) ×2 IMPLANT
TOWEL OR 17X26 10 PK STRL BLUE (TOWEL DISPOSABLE) ×2 IMPLANT
TRAY FOLEY CATH 14FRSI W/METER (CATHETERS) ×2 IMPLANT
TULIP CREP AMP 5.5MM (Orthopedic Implant) ×4 IMPLANT
WATER STERILE IRR 1000ML POUR (IV SOLUTION) ×2 IMPLANT

## 2014-09-01 NOTE — H&P (Signed)
Jamie [name] is an 44 y.o. female.   Chief Complaint: Back and right leg pain HPI: Patient is a very pleasant 44 year old female who had previously undergone anterior lateral fusion at L3-4 remotely at an outside institution patient did fairly well however last several weeks and months and years has had progressive worsening back and bilateral leg pain worse on the right patient been working with a pain clinic out of we [place] undergone multiple trials of injections physical therapy physician directed therapy without success. 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 Due to the patient's failure conservative treatment imaging findings and progression of clinical syndrome I recommended decompression stabilization procedure at L4-5 and L5-S1. I have extensively reviewed the risks and benefits of the operation the patient as well as perioperative course expectations of outcome and alternatives of surgery and she understood and agreed to proceed forward.  Past Medical History  Diagnosis Date  . PONV (postoperative nausea and vomiting)   . Family history of adverse reaction to anesthesia     pta dad is very hard to wake up;excessive nausea  . Depression     takes Effexor daily  . History of bronchitis     > 52yr ago  . Seasonal allergies     uses Flonase daily  . Arthritis   . Joint pain   . Chronic back pain     herniated disc/stenosis/scoliosis  . Vitamin D deficiency     takes Vit D daily  . Eczema     uses a cream daily as needed  . Anxiety     takes Ativan daily  . Hypoglycemia   . Insomnia     takes Lorazepam nightly    Past Surgical History  Procedure Laterality Date  . Back surgery  2008/2012    laminectomy 1st time and 2nd time laminectomy and fusion    History reviewed. No pertinent family history. Social History:  reports that she has been smoking.  She does not have any smokeless tobacco  history on file. She reports that she does not drink alcohol. Her drug history is not on file.  Allergies:  Allergies  Allergen Reactions  . Shellfish Allergy Hives  . Flexeril [Cyclobenzaprine] Hives    RAPID HEARTBEAT    Medications Prior to Admission  Medication Sig Dispense Refill  . baclofen (LIORESAL) 10 MG tablet Take 10 mg by mouth 3 (three) times daily.    . cholecalciferol (VITAMIN D) 1000 UNITS tablet Take 1,000 Units by mouth daily.    . clobetasol cream (TEMOVATE) 5.17 % Apply 1 application topically 2 (two) times daily.    . fluticasone (FLONASE) 50 MCG/ACT nasal spray Place 1 spray into both nostrils daily.    Marland Kitchen gabapentin (NEURONTIN) 800 MG tablet Take 800 mg by mouth 3 (three) times daily.    Marland Kitchen LORazepam (ATIVAN) 0.5 MG tablet Take 0.5 mg by mouth 2 (two) times daily as needed for anxiety.    Marland Kitchen morphine (MSIR) 30 MG tablet Take 30 mg by mouth 2 (two) times daily.    Marland Kitchen oxyCODONE (ROXICODONE) 15 MG immediate release tablet Take 15 mg by mouth every 4 (four) hours as needed for pain.    Marland Kitchen venlafaxine (EFFEXOR) 75 MG tablet Take 75 mg by mouth daily. Patient takes with 150 mg to equal 225 mg    . venlafaxine XR (EFFEXOR-XR) 150 MG 24 hr capsule Take 150 mg by mouth daily with breakfast. Patient takes with  75 mg to equal 225 mg      No results found for this or any previous visit (from the past 48 hour(s)). No results found.  Review of Systems  Constitutional: Negative.   HENT: Negative.   Eyes: Negative.   Respiratory: Negative.   Cardiovascular: Negative.   Gastrointestinal: Negative.   Genitourinary: Negative.   Musculoskeletal: Positive for back pain and joint pain.  Skin: Negative.   Neurological: Positive for tingling and sensory change.  Psychiatric/Behavioral: Negative.     Blood pressure 116/72, pulse 88, temperature 98.2 F (36.8 C), temperature source Oral, resp. rate 18, height 5\' 7"  (1.702 m), weight 78.331 kg (172 lb 11 oz), last menstrual period  07/02/2014, SpO2 100 %. Physical Exam  Constitutional: She is oriented to person, place, and time. She appears well-developed and well-nourished.  HENT:  Head: Normocephalic.  Eyes: Pupils are equal, round, and reactive to light.  Neck: Normal range of motion.  Respiratory: Effort normal.  GI: Soft. Bowel sounds are normal.  Musculoskeletal: Normal range of motion.  Neurological: She is alert and oriented to person, place, and time. She has normal strength. GCS eye subscore is 4. GCS verbal subscore is 5. GCS motor subscore is 6.  Reflex Scores:      Patellar reflexes are 0 on the right side and 0 on the left side.      Achilles reflexes are 0 on the right side and 0 on the left side. Strength is 5 out of 5 in her iliopsoas, quads, hamstrings, gastrocs, into tibialis, and EHL.     Assessment/Plan 44 year old female presents for decompression stabilization procedure at L4-5 and L5-S1.  Jamie [name] P 09/01/2014, 8:04 AM

## 2014-09-01 NOTE — Transfer of Care (Signed)
Immediate Anesthesia Transfer of Care Note  Patient: Jamie [name]  Procedure(s) Performed: Procedure(s) with comments: Lumbar four-five, Lumbar five-Sacral one Maximum Access Surgery posterior lumbar interbody fusion with interbody prosthesis posterior lateral arthrodesis posterior segmental instrumentation (N/A) - Lumbar four-five, Lumbar five-Sacral one Maximum Access Surgery posterior lumbar interbody fusion with interbody prosthesis posterior lateral arthrodesis posterior segmental instrumentation  Patient Location: PACU  Anesthesia Type:General  Level of Consciousness: awake and alert   Airway & Oxygen Therapy: Patient Spontanous Breathing and Patient connected to nasal cannula oxygen  Post-op Assessment: Report given to PACU RN and Post -op Vital signs reviewed and stable  Post vital signs: Reviewed and stable  Complications: No apparent anesthesia complications

## 2014-09-01 NOTE — Progress Notes (Signed)
1313-receiving report from [place] at this time. All prior documentation done by Noe Gens RN

## 2014-09-01 NOTE — Op Note (Signed)
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 and degenerative scoliosis L4-5 L5-S1 with bilateral L4-L5 and S1 radiculopathies  Postoperative diagnosis: Same  Procedure: Decompressive lumbar laminectomies complete medial facetectomies a radical foraminotomies L4-5 and L5-S1 and excess and requiring more work than would be needed with a standard interbody fusion  #2 posterior lumbar interbody fusion L4-5 L5-S1 using the globus rise expandable peek cages packed with locally harvested autograft mixed withallostem morsels and strips  #3 pedicle screw fixation L4-S1 using the globus Creo modular pedicle screw set  Before placement of a large Hemovac drain  Surgeon Dr. Dominica Severin Talecia Sherlin  Asst.: Dr. Ashok Pall  Anesthesia: Gen.  EBL: Minimal  History of present illness: 44 year old female with long same back pain status post L3-4 anterolateral fusion who presents with progressive deterioration degeneration at L4-5 and L5-S1 with bilateral L4-L5 and S1 radiculopathies worse on the right. Patient failed all forms of conservative treatment with pain management epidural steroid injections physical therapy and positioned directed therapy. And due to her progression of clinical syndrome imaging findings and failure conservative treatment I recommended decompression stabilization procedure at L4-5 and L5-S1. I extensively went over the risks and benefits of the operation with the patient as well as perioperative course expectations of outcome and alternatives of surgery and she understood and agreed to proceed forward.   Operative procedure: Patient brought into the or was induced under general anesthesia positioned prone on flat rolls her back was prepped and draped in routine sterile fashion preoperative x-ray localize the appropriate level so after infiltration of 10 mL lidocaine with epi a midline incision was made and Bovie light cautery was used to take  down the subcutaneous tissues and subperiosteal dissections care lamina of L4-L5 and S1 bilaterally. Interoperative x-ray confirmed the location for level so than under AP and lateral fluoroscopy pilot holes were drilled in the inferomedial aspect both pedicles of 5 and 7:00 positions then hand drilled probed and tapped with a 50 tap probed again and 6050 globus cortical modular screws 35 mm in length were placed and the pedicle at L4 directing medial laterally and superiorly. After both screws were placed and position confirmed with fluoroscopy attention second the decompression spinous processes at L4 and L5 were removed central decompression was begun complete facetectomies were performed under biting of the medial gutter and the lateral lateral aspect of the superior tickling facet allowed and indication of this of the lateral disc space marked facet arthropathy was causing severe hourglass pressure thecal sac and marked foraminal stenosis of the L4 and L5 nerve roots from arthropathy in the superior lateral aspect of the superior tickling facet of both levels. This was all aggressively under been decompress and the 4 and 5 roots S1 root was also unroofed then the disc spaces were identified or veins are coagulated disc spaces were incised bilaterally cleaned out bilaterally first working at L4-5, I placed sequential distraction with a 9 mm distractor on the patient's left side the right side which had a significant scoliotic collapse at L4-5 and cleanout pituitary rongeurs opened up the disc space with the contralateral distractor and after adequate preparation endplate and cleaning of the disc inserted a 7-13 expanded the cage packed with local autograft mixed with allostem. Expanded this approximate 3 turns which opened up that disc space on that side and decompressed the L4 foramina. Detail the distractor in a similar fashion Alfredo Martinez with a 10 distractor in place and this I used an 814 expandable  10  lordotic cage and expanded L and also having been packed with the autograft mixed. Then after all the interbody and prepared on the contralateral side bone graft was packed centrally this also sponge anteriorly the contralateral cage was inserted and eccentric expanded and a similar fashion. Then tensioned taken to the screw placement at L5 and S1 using fluoroscopy to confirm the entry point and trajectory I placed the screws straight directing slightly laterally 30-35 mm in length at L5 and S1 all screws excellent purchase I did replace the L5 screw on the left side with a slightly bigger and slightly shorter screw after AP fluoroscopy confirmed that the screw was a little bit long Her AP and lateral fluoroscopy confirmed good position of all the implants I assembled the head to the L4 modular screw. 70 no matter rods were chosen anchored in place all the foramina were then reinspected to confirm patency no further stenosis on the L4-L5 or S1 nerve roots bilaterally Gelfoam was overlaid top the dura large abductor was placed in thickness and states was maintained the wound scope was irrigated was then closed with interrupted Vicryl and running 4 septic or and the skin Dermabond benzo and Steri-Strips and sterile dressing were applied and patient recovered in stable condition. At the end of case all needle counts sponge counts were correct.

## 2014-09-01 NOTE — Anesthesia Preprocedure Evaluation (Addendum)
Anesthesia Evaluation  Patient identified by MRN, date of birth, ID band Patient awake    Reviewed: Allergy & Precautions, NPO status , Patient's Chart, lab work & pertinent test results  History of Anesthesia Complications (+) PONV and history of anesthetic complications  Airway Mallampati: II   Neck ROM: full    Dental  (+) Dental Advisory Given, Chipped   Pulmonary Current Smoker,          Cardiovascular negative cardio ROS      Neuro/Psych Anxiety Depression    GI/Hepatic   Endo/Other    Renal/GU      Musculoskeletal  (+) Arthritis -,   Abdominal   Peds  Hematology   Anesthesia Other Findings   Reproductive/Obstetrics                            Anesthesia Physical Anesthesia Plan  ASA: II  Anesthesia Plan: General   Post-op Pain Management:    Induction: Intravenous  Airway Management Planned: Oral ETT  Additional Equipment:   Intra-op Plan:   Post-operative Plan: Extubation in OR  Informed Consent: I have reviewed the patients History and Physical, chart, labs and discussed the procedure including the risks, benefits and alternatives for the proposed anesthesia with the patient or authorized representative who has indicated his/her understanding and acceptance.     Plan Discussed with: CRNA, Anesthesiologist and Surgeon  Anesthesia Plan Comments:         Anesthesia Quick Evaluation

## 2014-09-01 NOTE — Anesthesia Postprocedure Evaluation (Signed)
Anesthesia Post Note  Patient: Jamie [name]  Procedure(s) Performed: Procedure(s) (LRB): Lumbar four-five, Lumbar five-Sacral one Maximum Access Surgery posterior lumbar interbody fusion with interbody prosthesis posterior lateral arthrodesis posterior segmental instrumentation (N/A)  Anesthesia type: General  Patient location: PACU  Post pain: Pain level controlled and Adequate analgesia  Post assessment: Post-op Vital signs reviewed, Patient's Cardiovascular Status Stable, Respiratory Function Stable, Patent Airway and Pain level controlled  Last Vitals:  Filed Vitals:   09/01/14 1405  BP: 92/49  Pulse: 100  Temp:   Resp: 20    Post vital signs: Reviewed and stable  Level of consciousness: awake, alert  and oriented  Complications: No apparent anesthesia complications

## 2014-09-01 NOTE — Anesthesia Procedure Notes (Addendum)
Procedure Name: Intubation Date/Time: 09/01/2014 8:33 AM Performed by: Octavio Graves Pre-anesthesia Checklist: Patient identified, Timeout performed, Emergency Drugs available, Suction available and Patient being monitored Patient Re-evaluated:Patient Re-evaluated prior to inductionOxygen Delivery Method: Circle system utilized Preoxygenation: Pre-oxygenation with 100% oxygen Intubation Type: IV induction Ventilation: Mask ventilation without difficulty Laryngoscope Size: Miller and 2 Grade View: Grade I Tube type: Oral Tube size: 7.0 mm Airway Equipment and Method: LTA kit utilized and Stylet Placement Confirmation: ETT inserted through vocal cords under direct vision,  breath sounds checked- equal and bilateral and positive ETCO2 Secured at: 22 cm Tube secured with: Tape Dental Injury: Teeth and Oropharynx as per pre-operative assessment  Comments: IV induction Hodierene- intubation AM CRNA- front teeth with irregular surfaces prior to laryngoscopy- intubation was atraumatic teeth and mouth as preop- bilat BS Hodierene   Performed by: Octavio Graves

## 2014-09-01 NOTE — Progress Notes (Signed)
Pt in room 4N02. Dressing clean, dry, intact. Hemovac, foley catheter intact, draining. VSs stable. Pt, family oriented to room and equipment. Will continue to monitor.

## 2014-09-02 MED FILL — Sodium Chloride IV Soln 0.9%: INTRAVENOUS | Qty: 1000 | Status: AC

## 2014-09-02 MED FILL — Heparin Sodium (Porcine) Inj 1000 Unit/ML: INTRAMUSCULAR | Qty: 30 | Status: AC

## 2014-09-02 NOTE — Evaluation (Signed)
Occupational Therapy Evaluation Patient Details Name: Jamie [name] MRN: [id number] DOB: March 26, 1971 Today's Date: 09/02/2014    History of Present Illness Patient is a very pleasant 44 year old female who had previously undergone anterior lateral fusion at L3-4 remotely at an outside institution patient did fairly well however last several weeks and months and years has had progressive worsening back and bilateral leg pain worse on the right patient been working with a pain clinic out of we [place] undergone multiple trials of injections physical therapy physician directed therapy without success. 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 Due to the patient's failure conservative treatment imaging findings and progression of clinical syndrome . Patient underwent decompression stabilization procedure at L4-5 and L5-S1 09/01/2014.    Clinical Impression   Patient admitted with above. Patient independent PTA. Patient currently functioning at an overall supervision level. Please see OT problem list below. Feel patient will benefit from acute OT to increase overall independence in the areas of ADLs & functional mobility and in order to safely discharge to her parents house.      Follow Up Recommendations  No OT follow up;Supervision/Assistance - 24 hour    Equipment Recommendations  None recommended by OT    Recommendations for Other Services  None at this time     Precautions / Restrictions Precautions Precautions: Fall;Back Precaution Booklet Issued: Yes (comment) Precaution Comments: administerred back precautions handout and educated patient on back precautions Required Braces or Orthoses: Spinal Brace Spinal Brace: Lumbar corset Restrictions Weight Bearing Restrictions: No      Mobility Bed Mobility Overal bed mobility: Needs Assistance Bed Mobility: Rolling;Supine to Sit Rolling: Supervision    Supine to sit: Supervision     General bed mobility comments: using bed rails, patient with increased pain  Transfers Overall transfer level: Needs assistance Equipment used: Rolling walker (2 wheeled) Transfers: Sit to/from Stand Sit to Stand: Supervision    Balance Overall balance assessment: No apparent balance deficits (not formally assessed)     ADL Overall ADL's : Needs assistance/impaired Eating/Feeding: Independent   Grooming: Supervision/safety   Upper Body Bathing: Supervision/ safety   Lower Body Bathing: Supervison/ safety   Upper Body Dressing : Supervision/safety   Lower Body Dressing: Supervision/safety   Toilet Transfer: Supervision/safety   Toileting- Clothing Manipulation and Hygiene: Supervision/safety       Functional mobility during ADLs: Supervision/safety General ADL Comments: Patient overall supervision for ADL tasks and functional mobility. Initially, post discharge, patient will benefit from 24/7 supervision. Believe patient will reach mod I level quickly. No OT follow-up recommended, but clinically think patient will benefit from acute skilled OT to reach mod I goals.      Vision  Patient wears glasses/contacts at all times, no apparent deficits   Perception Perception Perception Tested?: No   Praxis Praxis Praxis tested?: Within functional limits    Pertinent Vitals/Pain Pain Assessment: 0-10 Pain Score: 8  Pain Location: back Pain Descriptors / Indicators: Aching;Burning;Stabbing Pain Intervention(s): Monitored during session;Repositioned;Patient requesting pain meds-RN notified     Hand Dominance Right   Extremity/Trunk Assessment Upper Extremity Assessment Upper Extremity Assessment: Overall WFL for tasks assessed   Lower Extremity Assessment Lower Extremity Assessment: Defer to PT evaluation   Cervical / Trunk Assessment Cervical / Trunk Assessment: Normal   Communication Communication Communication: No  difficulties   Cognition Arousal/Alertness: Awake/alert Behavior During Therapy: WFL for tasks assessed/performed Overall Cognitive Status: Within Functional Limits for tasks assessed  Home Living Family/patient expects to be discharged to:: Private residence Living Arrangements: Parent (mom and dad) Available Help at Discharge: Family;Available 24 hours/day Type of Home: House Home Access: Stairs to enter [employer] of Steps: 2 for front entrance, 4 for back entrance Entrance Stairs-Rails: Can reach both Home Layout: Two level Alternate Level Stairs-Number of Steps: flight Alternate Level Stairs-Rails: Can reach both Bathroom Shower/Tub: Tub/shower unit Shower/tub characteristics: Architectural technologist: Standard Bathroom Accessibility: Yes How Accessible: Accessible via walker Home Equipment: Kasandra Knudsen - single point (patient reports cane is mothers)   Additional Comments: These specifics are based on patient's parents house, patient plans to discharge there.       Prior Functioning/Environment Level of Independence: Independent             OT Diagnosis: Generalized weakness   OT Problem List: Decreased strength;Decreased activity tolerance;Pain;Decreased knowledge of precautions   OT Treatment/Interventions: Self-care/ADL training;Therapeutic exercise;Energy conservation;Therapeutic activities;Patient/family education;Balance training    OT Goals(Current goals can be found in the care plan section) Acute Rehab OT Goals Patient Stated Goal: none stated OT Goal Formulation: With patient Time For Goal Achievement: 09/13/14 Potential to Achieve Goals: Good ADL Goals Pt Will Perform Grooming: Independently;standing (with back brace) Pt Will Perform Upper Body Bathing: Independently;standing Pt Will Perform Lower Body Bathing: with modified independence;sit to/from stand Pt Will Perform Upper Body Dressing: Independently;standing Pt Will Perform  Lower Body Dressing: with modified independence;sit to/from stand Pt Will Transfer to Toilet: ambulating;with modified independence;regular height toilet Pt Will Perform Toileting - Clothing Manipulation and hygiene: with modified independence;sit to/from stand Pt Will Perform Tub/Shower Transfer: with modified independence;Shower transfer;ambulating;shower seat Additional ADL Goal #1: Patient will independently verbalize and adhere to 3/3 back precautions during ADL and functional mobility  OT Frequency: Min 2X/week   Barriers to D/C:   None known at this time          End of Session Equipment Utilized During Treatment: Gait belt;Rolling walker Nurse Communication:  (need for pain medication)  Activity Tolerance: Patient tolerated treatment well Patient left: in chair;with call bell/phone within reach   Time: 3762-8315 OT Time Calculation (min): 35 min Charges:  OT General Charges $OT Visit: 1 Procedure OT Evaluation $Initial OT Evaluation Tier I: 1 Procedure OT Treatments $Self Care/Home Management : 23-37 mins  Mckyla Deckman , MS, OTR/L, CLT  Pager: [phone]  09/02/2014, 9:32 AM

## 2014-09-02 NOTE — Evaluation (Signed)
Physical Therapy Evaluation Patient Details Name: Jamie [name] MRN: [id number] DOB: 1970/09/16 Today's Date: 09/02/2014   History of Present Illness  Patient is a very pleasant 44 year old female who had previously undergone anterior lateral fusion at L3-4 remotely at an outside institution patient did fairly well however last several weeks and months and years has had progressive worsening back and bilateral leg pain worse on the right patient been working with a pain clinic out of we [place] undergone multiple trials of injections physical therapy physician directed therapy without success. 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 Due to the patient's failure conservative treatment imaging findings and progression of clinical syndrome . Patient underwent decompression stabilization procedure at L4-5 and L5-S1 09/01/2014.   Clinical Impression  Pt admitted with above diagnosis. Pt currently with functional limitations due to the deficits listed below (see PT Problem List).  Pt will benefit from skilled PT to increase their independence and safety with mobility to allow discharge to the venue listed below.  Will follow patient acutely, but do not feel she will need any PT follow up.  Discussed stairs and pt states she is probably going to sleep on the first floor for a while.  Pt seems to be primarily limited by pain at this time.     Follow Up Recommendations No PT follow up    Equipment Recommendations  Rolling walker with 5" wheels    Recommendations for Other Services       Precautions / Restrictions Precautions Precautions: Fall;Back Precaution Booklet Issued: Yes (comment) Precaution Comments: Pt with good recall of precautions from earlier OT session Required Braces or Orthoses: Spinal Brace Spinal Brace: Lumbar corset Restrictions Weight Bearing Restrictions: No      Mobility  Bed  Mobility Overal bed mobility: Needs Assistance Bed Mobility: Sit to Sidelying Rolling: Min assist   Supine to sit: Min guard   Sit to sidelying: Min assist General bed mobility comments: Pt requiring MIN A for turning hips with rolling and to get legs up on bed with getting back to bed.  Transfers Overall transfer level: Needs assistance Equipment used: Rolling walker (2 wheeled) Transfers: Sit to/from Stand Sit to Stand: Supervision         General transfer comment: Guarded due to pain. Cues for technique.  Ambulation/Gait Ambulation/Gait assistance: Supervision;Min guard Ambulation Distance (Feet): 90 Feet Assistive device: Rolling walker (2 wheeled) Gait Pattern/deviations: Step-through pattern;Decreased step length - right;Decreased step length - left Gait velocity: decreased   General Gait Details: Pt moving slowly and guarded due to pain.  Nurse gave pain meds prior to treatment and then after gait gave her some IV pain med as well.  Stairs            Wheelchair Mobility    Modified Rankin (Stroke Patients Only)       Balance Overall balance assessment: No apparent balance deficits (not formally assessed)                                           Pertinent Vitals/Pain Pain Assessment: 0-10 Pain Score: 8  Pain Location: back Pain Descriptors / Indicators: Aching;Stabbing Pain Intervention(s): Premedicated before session;Patient requesting pain meds-RN notified;Limited activity within patient's tolerance;Utilized relaxation techniques    Home Living Family/patient expects to be discharged to:: Private residence Living Arrangements: Parent (mom and dad) Available  Help at Discharge: Family;Available 24 hours/day Type of Home: House Home Access: Stairs to enter Entrance Stairs-Rails: Can reach both Entrance Stairs-Number of Steps: 2 for front entrance, 4 for back entrance Home Layout: Two level Home Equipment: Cane - single point  (patient reports cane is mothers) Additional Comments: These specifics are based on patient's parents house, patient plans to discharge there.     Prior Function Level of Independence: Independent               Hand Dominance   Dominant Hand: Right    Extremity/Trunk Assessment   Upper Extremity Assessment: Defer to OT evaluation           Lower Extremity Assessment: Overall WFL for tasks assessed      Cervical / Trunk Assessment: Normal  Communication   Communication: No difficulties  Cognition Arousal/Alertness: Awake/alert Behavior During Therapy: WFL for tasks assessed/performed Overall Cognitive Status: Within Functional Limits for tasks assessed                      General Comments      Exercises        Assessment/Plan    PT Assessment Patient needs continued PT services  PT Diagnosis Acute pain;Difficulty walking   PT Problem List Decreased balance;Decreased mobility;Pain;Decreased knowledge of precautions  PT Treatment Interventions Functional mobility training;Therapeutic activities;Patient/family education;Gait training;Stair training;DME instruction   PT Goals (Current goals can be found in the Care Plan section) Acute Rehab PT Goals Patient Stated Goal: decrease pain PT Goal Formulation: With patient Time For Goal Achievement: 09/16/14 Potential to Achieve Goals: Good    Frequency Min 5X/week   Barriers to discharge        Co-evaluation               End of Session Equipment Utilized During Treatment: Gait belt;Back brace Activity Tolerance: Patient tolerated treatment well;Patient limited by pain Patient left: in bed;with nursing/sitter in room Nurse Communication: Mobility status         Time: 1105-1130 PT Time Calculation (min) (ACUTE ONLY): 25 min   Charges:   PT Evaluation $Initial PT Evaluation Tier I: 1 Procedure PT Treatments $Gait Training: 8-22 mins   PT G Codes:        Jamie [name]  [name] 09/02/2014, 12:38 PM

## 2014-09-02 NOTE — Progress Notes (Signed)
Chaplain refered to pt for advanced directive help. Chaplain offered prayer and emotional support. Pt father, [name], at bedside. Chaplain and pt spoke about multiple facets of pt life and explored the theme of powerlessness and themes of grief. Pt very concerned that she will lose her grandmother during her hospitalization. Chaplain will continue to follow. Chaplain informed pt of chaplain presence should she need services before follow up. Page chaplain as needed.    09/02/14 1000  Clinical Encounter Type  Visited With Patient and family together  Visit Type Initial;Spiritual support  Referral From Nurse  Spiritual Encounters  Spiritual Needs Emotional;Grief support;Prayer  Stress Factors  Patient Stress Factors Loss;Family relationships;Health changes  Jamie [name], Epifanio Lesches 09/02/2014 10:21 AM

## 2014-09-02 NOTE — Progress Notes (Signed)
Subjective: Patient reports Him better with no radicular symptoms back and hip pain bilaterally  Objective: Vital signs in last 24 hours: Temp:  [98.2 F (36.8 C)-99 F (37.2 C)] 98.4 F (36.9 C) (01/14 0529) Pulse Rate:  [84-106] 91 (01/14 0529) Resp:  [6-41] 16 (01/14 0529) BP: (90-114)/(32-55) 109/49 mmHg (01/14 0529) SpO2:  [95 %-100 %] 96 % (01/14 0529)  Intake/Output from previous day: 01/13 0701 - 01/14 0700 In: 2750 [I.V.:2500; IV Piggyback:250] Out: 3280 [Urine:2700; Drains:280; Blood:300] Intake/Output this shift:    Strength out of 5 wound clean dry and intact  Lab Results: No results for input(s): WBC, HGB, HCT, PLT in the last 72 hours. BMET No results for input(s): NA, K, CL, CO2, GLUCOSE, BUN, CREATININE, CALCIUM in the last 72 hours.  Studies/Results: Dg Lumbar Spine 2-3 Views  09/01/2014   CLINICAL DATA:  L4-5 L5-S1 MAS PLIF  EXAM: DG C-ARM 61-120 MIN; LUMBAR SPINE - 2-3 VIEW  COMPARISON:  None.  FINDINGS: Fluoro time reported as 58 seconds. Three fluoro spot films are submitted. The patient has undergone posterior fusion with intradiscal device placement from L4 through S1. Radiographic positioning of the metallic appliances is good. Surgical sponge material is present on one of the images.  IMPRESSION: There is no immediate postprocedure complication following posterior lumbar fusion and intradiscal device placement at L4-5 and L5-S1.   Electronically Signed   By: David  Martinique   On: 09/01/2014 12:48   Dg C-arm 1-60 Min  09/01/2014   CLINICAL DATA:  L4-5 L5-S1 MAS PLIF  EXAM: DG C-ARM 61-120 MIN; LUMBAR SPINE - 2-3 VIEW  COMPARISON:  None.  FINDINGS: Fluoro time reported as 58 seconds. Three fluoro spot films are submitted. The patient has undergone posterior fusion with intradiscal device placement from L4 through S1. Radiographic positioning of the metallic appliances is good. Surgical sponge material is present on one of the images.  IMPRESSION: There is no  immediate postprocedure complication following posterior lumbar fusion and intradiscal device placement at L4-5 and L5-S1.   Electronically Signed   By: David  Martinique   On: 09/01/2014 12:48    Assessment/Plan: Mobilized today with physical and occupational therapy  LOS: 1 day     Jamie [name] P 09/02/2014, 7:45 AM

## 2014-09-02 NOTE — Progress Notes (Signed)
CARE MANAGEMENT NOTE 09/02/2014  Patient:  Jamie [name], Jamie [name]   Account Number:  [id number]  Date Initiated:  09/02/2014  Documentation initiated by:  Olga Coaster  Subjective/Objective Assessment:   ADMITTED FOR SURGERY     Action/Plan:   CM FOLLOWING FOR DCP   Anticipated DC Date:  09/05/2014   Anticipated DC Plan:  AWAITING FOR PT/OT EVALS FOR DISPOSITION NEEDS     DC Planning Services  CM consult         Status of service:  In process, will continue to follow Medicare Important Message given?   (If response is "NO", the following Medicare IM given date fields will be blank)  Per UR Regulation:  Reviewed for med. necessity/level of care/duration of stay  Comments:  1/14/2016Mindi Slicker RN,BSN,MHA [phone]

## 2014-09-03 NOTE — Progress Notes (Signed)
Occupational Therapy Treatment Patient Details Name: Xylia Scherger MRN: [id number] DOB: 03/05/1971 Today's Date: 09/03/2014    History of present illness  Patient underwent decompression stabilization procedure at L4-5 and L5-S1 09/01/2014.    OT comments  Pt making steady progress. Pt stating she wants to go home tonight so she can see her grandmother before she passes away. Nsg notified. Completed all education. Pt overall s to mod I level. Ready for D/C home with intermittent S when medically stable. OT signing off.   Follow Up Recommendations  No OT follow up;Supervision - Intermittent    Equipment Recommendations  None recommended by OT    Recommendations for Other Services      Precautions / Restrictions Precautions Precautions: Back Precaution Booklet Issued: Yes (comment) Precaution Comments: able to recall 3/3 precautions Required Braces or Orthoses: Spinal Brace Spinal Brace: Lumbar corset       Mobility Bed Mobility Overal bed mobility: Modified Independent                Transfers Overall transfer level: Modified independent                    Balance Overall balance assessment: No apparent balance deficits (not formally assessed)                                 ADL                           Toilet Transfer: Modified Independent   Toileting- Clothing Manipulation and Hygiene: Supervision/safety       Functional mobility during ADLs: Modified independent General ADL Comments: Completed education for ADL with compensatory techniques and use of DME                                      Cognition   Behavior During Therapy: WFL for tasks assessed/performed Overall Cognitive Status: Within Functional Limits for tasks assessed                      Maurie Boettcher, OTR/L  [phone] 09/03/2014 Pertinent Vitals/ Pain       Pain Assessment: 0-10 Pain Score: 6  Pain Location: back Pain Descriptors /  Indicators: Aching Pain Intervention(s): Limited activity within patient's tolerance;Monitored during session;Repositioned;Ice applied  Home Living                                          Prior Functioning/Environment              Frequency       Progress Toward Goals  OT Goals(current goals can now be found in the care plan section)  Progress towards OT goals: Goals met/education completed, patient discharged from OT  Acute Rehab OT Goals Patient Stated Goal: to go home and see my grandmother OT Goal Formulation: With patient Time For Goal Achievement: 09/13/14 Potential to Achieve Goals: Good ADL Goals Pt Will Perform Grooming: Independently;standing Pt Will Perform Upper Body Bathing: Independently;standing Pt Will Perform Lower Body Bathing: with modified independence;sit to/from stand Pt Will Perform Upper Body Dressing: Independently;standing Pt Will Perform Lower Body Dressing: with modified independence;sit to/from stand Pt Will Transfer to Toilet:  ambulating;with modified independence;regular height toilet Pt Will Perform Toileting - Clothing Manipulation and hygiene: with modified independence;sit to/from stand Pt Will Perform Tub/Shower Transfer: with modified independence;Shower transfer;ambulating;shower seat Additional ADL Goal #1: Patient will independently verbalize and adhere to 3/3 back precautions during ADL and functional mobility  Plan Discharge plan remains appropriate    Co-evaluation                 End of Session Equipment Utilized During Treatment: Gait belt;Rolling walker;Back brace   Activity Tolerance Patient tolerated treatment well   Patient Left in bed;with call bell/phone within reach   Nurse Communication Mobility status        Time: 2248-2500 OT Time Calculation (min): 29 min  Charges: OT General Charges $OT Visit: 1 Procedure OT Treatments $Self Care/Home Management : 23-37  mins  Alleene Stoy,HILLARY 09/03/2014, 4:09 PM

## 2014-09-03 NOTE — Progress Notes (Signed)
Physical Therapy Treatment Patient Details Name: Jamie [name] MRN: [id number] DOB: [birth year] Today's Date: 09/03/2014    History of Present Illness Patient is a very pleasant 44 year old female who had previously undergone anterior lateral fusion at L3-4 remotely at an outside institution patient did fairly well however last several weeks and months and years has had progressive worsening back and bilateral leg pain worse on the right patient been working with a pain clinic out of we [place] undergone multiple trials of injections physical therapy physician directed therapy without success. 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 Due to the patient's failure conservative treatment imaging findings and progression of clinical syndrome . Patient underwent decompression stabilization procedure at L4-5 and L5-S1 09/01/2014.     PT Comments    Pt. Limited by pain during ambulation and very guarded. Pt. Would benefit from more gait training and stair training to increase speed if pain tolerance allows.   Follow Up Recommendations  No PT follow up     Equipment Recommendations  Rolling walker with 5" wheels    Recommendations for Other Services       Precautions / Restrictions Precautions Precautions: None Precaution Booklet Issued: Yes (comment) Precaution Comments: Pt with good recall of precautions from earlier OT session Required Braces or Orthoses: Spinal Brace Spinal Brace: Lumbar corset Restrictions Weight Bearing Restrictions: No    Mobility  Bed Mobility Overal bed mobility: Needs Assistance Bed Mobility: Sit to Sidelying Rolling: Min assist     Sit to supine: Min assist Sit to sidelying: Min assist General bed mobility comments: pt. adhered to back precautions when going from sit>supine. needed min A for LEs and trunk for alignment.  Transfers Overall transfer level: Needs  assistance Equipment used: Rolling walker (2 wheeled) Transfers: Sit to/from Stand Sit to Stand: Min guard         General transfer comment: Guarded due to pain. Cues for technique.  Ambulation/Gait Ambulation/Gait assistance: Supervision;Min guard Ambulation Distance (Feet): 90 Feet Assistive device: Rolling walker (2 wheeled) Gait Pattern/deviations: Narrow base of support;Step-through pattern;Decreased step length - right;Decreased step length - left Gait velocity: decreased Gait velocity interpretation: Below normal speed for age/gender General Gait Details: Pt. moving slowly and guarded due to pain. Verbal cues to widen BOS and posture   Stairs Stairs: Yes Stairs assistance: Min guard Stair Management: Two rails;Forwards;Alternating pattern Number of Stairs: 6 General stair comments: Pt. moved slowly and verbal cues and demonstration to foot placement and which leg to lead up and down.  Wheelchair Mobility    Modified Rankin (Stroke Patients Only)       Balance Overall balance assessment: No apparent balance deficits (not formally assessed)                                  Cognition Arousal/Alertness: Awake/alert Behavior During Therapy: WFL for tasks assessed/performed Overall Cognitive Status: Within Functional Limits for tasks assessed                      Exercises      General Comments        Pertinent Vitals/Pain Pain Assessment: 0-10 Pain Score: 8  Pain Location: back Pain Descriptors / Indicators: Aching Pain Intervention(s): Limited activity within patient's tolerance;Monitored during session    Home Living  Prior Function            PT Goals (current goals can now be found in the care plan section) Progress towards PT goals: Progressing toward goals    Frequency  Min 5X/week    PT Plan Current plan remains appropriate    Co-evaluation             End of Session Equipment  Utilized During Treatment: Back brace Activity Tolerance: Patient tolerated treatment well;Patient limited by pain Patient left: in bed;with call bell/phone within reach;with bed alarm set     Time: 1042-1100 PT Time Calculation (min) (ACUTE ONLY): 18 min  Charges:                       G Codes:      Jodi Geralds, [place] 09/03/2014, 11:12 AM

## 2014-09-03 NOTE — Progress Notes (Signed)
Subjective: Patient reports Doing okay still a lot of back and hip pain radicular pain seems be improved  Objective: Vital signs in last 24 hours: Temp:  [98.4 F (36.9 C)-100 F (37.8 C)] 98.5 F (36.9 C) (01/15 0630) Pulse Rate:  [93-119] 99 (01/15 0355) Resp:  [16-18] 18 (01/15 0630) BP: (93-113)/(50-61) 99/50 mmHg (01/15 0630) SpO2:  [94 %-99 %] 96 % (01/15 0630) FiO2 (%):  [2 %] 2 % (01/15 0630)  Intake/Output from previous day: 01/14 0701 - 01/15 0700 In: 243 [P.O.:240; I.V.:3] Out: 243 [Drains:243] Intake/Output this shift:    Strength out of 5 wound clean dry and intact  Lab Results: No results for input(s): WBC, HGB, HCT, PLT in the last 72 hours. BMET No results for input(s): NA, K, CL, CO2, GLUCOSE, BUN, CREATININE, CALCIUM in the last 72 hours.  Studies/Results: Dg Lumbar Spine 2-3 Views  09/01/2014   CLINICAL DATA:  L4-5 L5-S1 MAS PLIF  EXAM: DG C-ARM 61-120 MIN; LUMBAR SPINE - 2-3 VIEW  COMPARISON:  None.  FINDINGS: Fluoro time reported as 58 seconds. Three fluoro spot films are submitted. The patient has undergone posterior fusion with intradiscal device placement from L4 through S1. Radiographic positioning of the metallic appliances is good. Surgical sponge material is present on one of the images.  IMPRESSION: There is no immediate postprocedure complication following posterior lumbar fusion and intradiscal device placement at L4-5 and L5-S1.   Electronically Signed   By: David  Martinique   On: 09/01/2014 12:48   Dg C-arm 1-60 Min  09/01/2014   CLINICAL DATA:  L4-5 L5-S1 MAS PLIF  EXAM: DG C-ARM 61-120 MIN; LUMBAR SPINE - 2-3 VIEW  COMPARISON:  None.  FINDINGS: Fluoro time reported as 58 seconds. Three fluoro spot films are submitted. The patient has undergone posterior fusion with intradiscal device placement from L4 through S1. Radiographic positioning of the metallic appliances is good. Surgical sponge material is present on one of the images.  IMPRESSION: There is  no immediate postprocedure complication following posterior lumbar fusion and intradiscal device placement at L4-5 and L5-S1.   Electronically Signed   By: David  Martinique   On: 09/01/2014 12:48    Assessment/Plan: Continue to mobilize with physical and occupational therapy we'll DC the drain later today  LOS: 2 days     Jamie [name] P 09/03/2014, 7:52 AM

## 2014-09-03 NOTE — Progress Notes (Signed)
Hemo Vac has break at Y connection I taped it up and wrapped a wash cloth around it. Seems to have stopped leaking and is holding charge. Will monitor.

## 2014-09-03 NOTE — Progress Notes (Signed)
Chaplain initiated follow up with pt. Chaplain and pt explored feeling around pt grandmother who is actively dying. Pt expressed that she would like chaplain support when she gets the news that her grandmother has passed. Chaplain informed pt to ask her nurse to page chaplain when this time comes. Pt and chaplain explored feelings of anxiety and loss. Pt pastor is at bedside now. Chaplain will continue to follow.   09/03/14 1200  Clinical Encounter Type  Visited With Patient  Visit Type Follow-up;Spiritual support  Spiritual Encounters  Spiritual Needs Emotional  Stress Factors  Patient Stress Factors Loss;Family relationships  Rolly Salter, Chaplain 09/03/2014 12:21 PM

## 2014-09-04 MED ORDER — SODIUM CHLORIDE 0.9 % IV BOLUS (SEPSIS)
500.0000 mL | Freq: Once | INTRAVENOUS | Status: AC
  Administered 2014-09-04: 500 mL via INTRAVENOUS

## 2014-09-04 MED ORDER — SODIUM CHLORIDE 0.9 % IV SOLN
INTRAVENOUS | Status: DC
  Administered 2014-09-04: 09:00:00 via INTRAVENOUS

## 2014-09-04 MED ORDER — KETOROLAC TROMETHAMINE 30 MG/ML IJ SOLN
30.0000 mg | Freq: Four times a day (QID) | INTRAMUSCULAR | Status: DC | PRN
  Administered 2014-09-04 – 2014-09-05 (×3): 30 mg via INTRAVENOUS
  Filled 2014-09-04 (×3): qty 1

## 2014-09-04 NOTE — Progress Notes (Signed)
Patient has complained of a head ache all night Percocet brought the head pain under control but does not help the surgical pain in her back. Patient ambulated this evening about 150 feet and this brought her head ache back. Gave her more Percocet. Temperature has doped back to 99.4 at 0433 was 100.6 at 0200 will continue to monitor.

## 2014-09-04 NOTE — Progress Notes (Signed)
Patient c/o headache unresolved and worse in any position she has tried to rest and prn meds;. notifiied MD on call; orders received for Toradol prn; patient has some relief of headache after first dose; encouraged to return to bed; decrease activity tonight per MD and will monitor relief by AM using toradol prn tonight.  Back dressing remains dry; flat and clean; no drainage.

## 2014-09-04 NOTE — Progress Notes (Signed)
Filed Vitals:   09/04/14 0030 09/04/14 0200 09/04/14 0433 09/04/14 0618  BP: 100/43 97/45 104/42 89/41  Pulse: 107 104 96 97  Temp: 98.4 F (36.9 C) 100.6 F (38.1 C) 99.4 F (37.4 C) 98.6 F (37 C)  TempSrc: Oral Oral Oral Oral  Resp: 19 20 19 20   Height:      Weight:      SpO2: 98% 96% 99% 100%    She is complaining of headache that began on the evening of January 14. She explains that her headache is present whether she is lying flat or is up, but it has been worsening. No nausea or vomiting. Dressing examined, clean and dry, intact, no underlying swelling or drainage. Has been up and ambulating. Nursing staff initiated IV fluids because of low blood pressure. Low-grade fever overnight. SCD in place.  Plan: Operative note reviewed, unclear that this is a low pressure headache, particularly since it does not seem to be necessarily positional. We'll give patient a normal saline bolus (500 mL), and continue with normal saline fusion 125 mL per hour. We'll continue to monitor.  Hosie Spangle, MD 09/04/2014, 8:03 AM

## 2014-09-04 NOTE — Progress Notes (Signed)
Pt ambulated in the hallway appx 100 ft with family, using brace and walker.  Pt tolerated ambulation well, steady gait.

## 2014-09-04 NOTE — Progress Notes (Signed)
PT Cancellation Note  Patient Details Name: Jamie [name] MRN: [id number] DOB: 1971-04-01   Cancelled Treatment:    Reason Eval/Treat Not Completed: Medical issues which prohibited therapy;Other (comment). Attempted to see patient again this afternoon however patient still complaining of increasing headache. Stated she had walked in hall just a while ago and laid back down due headache. Patient also emotional about finding out her grandmother passed. At this time patient requesting to hold on further PT until tomorrow   Jacqualyn Posey 09/04/2014, 12:53 PM

## 2014-09-04 NOTE — Progress Notes (Signed)
PT Cancellation Note  Patient Details Name: Shailey Butterbaugh MRN: [id number] DOB: [birth year]   Cancelled Treatment:    Reason Eval/Treat Not Completed: Medical issues which prohibited therapy. Patient with decreased BP and 10/10 headache occuring since last night. Will hold PT at this time and follow up later today as time allows   [place], Tonia Brooms 09/04/2014, 8:18 AM

## 2014-09-05 NOTE — Discharge Summary (Signed)
Physician Discharge Summary  Patient ID: Jamie [name] MRN: [id number] DOB/AGE: 46-14-1972 46 y.o.  Admit date: 09/01/2014 Discharge date: 09/05/2014  Admission Diagnoses:degenerative lumbar disc disease  Discharge Diagnoses:  Active Problems:   Spinal stenosis of lumbar region   Discharged Condition: ambulating  Hospital Course: surgery  Consults: none  Significant Diagnostic Studies: mri  Treatments: lumbar fusion  Discharge Exam: Blood pressure 95/44, pulse 78, temperature 97.9 F (36.6 C), temperature source Oral, resp. rate 20, height 5\' 7"  (1.702 m), weight 78.331 kg (172 lb 11 oz), last menstrual period 07/02/2014, SpO2 98 %. ambulating  Disposition: home     Medication List    ASK your doctor about these medications        baclofen 10 MG tablet  Commonly known as:  LIORESAL  Take 10 mg by mouth 3 (three) times daily.     cholecalciferol 1000 UNITS tablet  Commonly known as:  VITAMIN D  Take 1,000 Units by mouth daily.     clobetasol cream 0.05 %  Commonly known as:  TEMOVATE  Apply 1 application topically 2 (two) times daily.     fluticasone 50 MCG/ACT nasal spray  Commonly known as:  FLONASE  Place 1 spray into both nostrils daily.     gabapentin 800 MG tablet  Commonly known as:  NEURONTIN  Take 800 mg by mouth 3 (three) times daily.     LORazepam 0.5 MG tablet  Commonly known as:  ATIVAN  Take 0.5 mg by mouth 2 (two) times daily as needed for anxiety.     morphine 30 MG tablet  Commonly known as:  MSIR  Take 30 mg by mouth 2 (two) times daily.     oxyCODONE 15 MG immediate release tablet  Commonly known as:  ROXICODONE  Take 15 mg by mouth every 4 (four) hours as needed for pain.     venlafaxine XR 150 MG 24 hr capsule  Commonly known as:  EFFEXOR-XR  Take 150 mg by mouth daily with breakfast. Patient takes with 75 mg to equal 225 mg     venlafaxine 75 MG tablet  Commonly known as:  EFFEXOR  Take 75 mg by mouth daily. Patient takes  with 150 mg to equal 225 mg         Signed: Kaiven Vester M 09/05/2014, 10:19 AM

## 2014-09-05 NOTE — Progress Notes (Signed)
Discharge orders received, pt for discharge home today, IV D/C with dressing CDI to lower back.  D/C instructions and Rx given with verbalized understanding.  Family at bedside to assist pt with discharge. Staff brought pt downstairs via wheelchair.

## 2014-09-05 NOTE — Progress Notes (Signed)
Pt ambulated in the hallway appx 200 ft with RN, using brace and withOUT walker today.  Pt tolerated ambulation well, steady gait. Pt still c/o H/A this morning, but Toradol gives some relief.

## 2014-09-08 ENCOUNTER — Encounter (HOSPITAL_COMMUNITY): Payer: Self-pay | Admitting: Neurosurgery

## 2014-10-12 ENCOUNTER — Ambulatory Visit: Payer: Self-pay | Admitting: Neurosurgery

## 2015-01-05 DIAGNOSIS — M545 Low back pain, unspecified: Secondary | ICD-10-CM | POA: Insufficient documentation

## 2015-01-05 DIAGNOSIS — G8929 Other chronic pain: Secondary | ICD-10-CM | POA: Insufficient documentation

## 2015-01-20 ENCOUNTER — Other Ambulatory Visit: Payer: Self-pay | Admitting: Neurosurgery

## 2015-01-20 DIAGNOSIS — M4807 Spinal stenosis, lumbosacral region: Secondary | ICD-10-CM

## 2015-01-27 ENCOUNTER — Ambulatory Visit: Payer: BLUE CROSS/BLUE SHIELD | Attending: Neurosurgery

## 2015-02-01 ENCOUNTER — Ambulatory Visit
Admission: RE | Admit: 2015-02-01 | Discharge: 2015-02-01 | Disposition: A | Discharge status: Home / Self Care | Payer: BLUE CROSS/BLUE SHIELD | Source: Ambulatory Visit | Attending: Neurosurgery | Admitting: Neurosurgery

## 2015-02-01 DIAGNOSIS — M4807 Spinal stenosis, lumbosacral region: Secondary | ICD-10-CM

## 2015-02-08 ENCOUNTER — Telehealth: Payer: Self-pay | Admitting: Family Medicine

## 2015-02-08 MED ORDER — LORAZEPAM 0.5 MG PO TABS: 0.5000 mg | ORAL_TABLET | Freq: Two times a day (BID) | ORAL | Status: DC | PRN

## 2015-02-08 NOTE — Telephone Encounter (Signed)
Lorazepam .5mg  1tab BID prn

## 2015-02-08 NOTE — Telephone Encounter (Signed)
Rx called to [facility]

## 2015-02-08 NOTE — Telephone Encounter (Signed)
Pt called stated she needs a refill on Lorazepam. Pharm is [facility].  Thanks.

## 2015-02-16 ENCOUNTER — Telehealth: Payer: Self-pay | Admitting: Family Medicine

## 2015-02-16 NOTE — Telephone Encounter (Signed)
Pt came in to get info about appt because she is going to run out of medicine on the 5th. We dont have any other appts open and she wants to know if there was any way she could get a partial prescription to last until her appt on the 19th. Would like a call back at [phone] and send script to [facility]

## 2015-03-08 ENCOUNTER — Ambulatory Visit (INDEPENDENT_AMBULATORY_CARE_PROVIDER_SITE_OTHER): Payer: BLUE CROSS/BLUE SHIELD | Admitting: Family Medicine

## 2015-03-08 ENCOUNTER — Encounter: Payer: Self-pay | Admitting: Family Medicine

## 2015-03-08 VITALS — BP 108/69 | HR 91 | Temp 98.3°F | Ht 67.0 in | Wt 170.0 lb

## 2015-03-08 DIAGNOSIS — F339 Major depressive disorder, recurrent, unspecified: Secondary | ICD-10-CM | POA: Insufficient documentation

## 2015-03-08 DIAGNOSIS — F329 Major depressive disorder, single episode, unspecified: Secondary | ICD-10-CM

## 2015-03-08 DIAGNOSIS — J018 Other acute sinusitis: Secondary | ICD-10-CM

## 2015-03-08 DIAGNOSIS — F32A Depression, unspecified: Secondary | ICD-10-CM

## 2015-03-08 MED ORDER — HYDROCOD POLST-CPM POLST ER 10-8 MG/5ML PO SUER: 5.0000 mL | Freq: Two times a day (BID) | ORAL | Status: DC

## 2015-03-08 MED ORDER — LORAZEPAM 0.5 MG PO TABS: 0.5000 mg | ORAL_TABLET | Freq: Two times a day (BID) | ORAL | Status: DC | PRN

## 2015-03-08 MED ORDER — AZITHROMYCIN 250 MG PO TABS: ORAL_TABLET | ORAL | Status: DC

## 2015-03-08 NOTE — Progress Notes (Signed)
   BP 108/69 mmHg  Pulse 91  Temp(Src) 98.3 F (36.8 C)  Ht 5\' 7"  (1.702 m)  Wt 170 lb (77.111 kg)  BMI 26.62 kg/m2  SpO2 96%  LMP 03/07/2015 (Exact Date)   Subjective:    Patient ID: Jamie [name], female    DOB: 03-Aug-1971, 44 y.o.   MRN: [id number]  HPI: Jamie [name] is a 44 y.o. female  Chief Complaint  Patient presents with  . Depression  . URI   Patient with marked cough cold fever or chills been ongoing for over 2 weeks dad also with similar symptoms. No nausea vomiting or diarrhea. Has developed a productive cough. Depression nerves and anxiety stable and has appointment with psychiatry later this month. Still going through a very difficult divorce.  Relevant past medical, surgical, family and social history reviewed and updated as indicated. Interim medical history since our last visit reviewed. Allergies and medications reviewed and updated.  Review of Systems  Constitutional: Positive for fatigue. Negative for fever.  HENT: Positive for congestion, ear pain, facial swelling, rhinorrhea and sinus pressure.   Respiratory: Positive for cough.   Cardiovascular: Negative.   Gastrointestinal: Negative.     Per HPI unless specifically indicated above     Objective:    BP 108/69 mmHg  Pulse 91  Temp(Src) 98.3 F (36.8 C)  Ht 5\' 7"  (1.702 m)  Wt 170 lb (77.111 kg)  BMI 26.62 kg/m2  SpO2 96%  LMP 03/07/2015 (Exact Date)  Wt Readings from Last 3 Encounters:  03/08/15 170 lb (77.111 kg)  09/01/14 172 lb 11 oz (78.331 kg)  08/26/14 172 lb 11.2 oz (78.336 kg)    Physical Exam  Constitutional: She is oriented to person, place, and time. She appears well-developed and well-nourished. No distress.  HENT:  Head: Normocephalic and atraumatic.  Right Ear: Hearing and external ear normal.  Left Ear: Hearing and external ear normal.  Nose: Nose normal.  Mouth/Throat: Oropharyngeal exudate present.  Eyes: Conjunctivae and lids are normal. Right eye exhibits no  discharge. Left eye exhibits no discharge. No scleral icterus.  Neck: Normal range of motion. No thyromegaly present.  Cardiovascular: Normal rate, regular rhythm and normal heart sounds.   Pulmonary/Chest: Effort normal. No respiratory distress.  Rhonchi and wheezes  Musculoskeletal: Normal range of motion.  Lymphadenopathy:    She has no cervical adenopathy.  Neurological: She is alert and oriented to person, place, and time.  Skin: Skin is intact. No rash noted.  Psychiatric: She has a normal mood and affect. Her speech is normal and behavior is normal. Judgment and thought content normal. Cognition and memory are normal.        Assessment & Plan:   Problem List Items Addressed This Visit      Other   Depression   Relevant Medications   LORazepam (ATIVAN) 0.5 MG tablet    Other Visit Diagnoses    Other acute sinusitis    -  Primary    Discussed care and treatment of sinusitis use of OTC medications and prescription medications along with cough syrup limitations on using cough syrup and cautio    Relevant Medications    azithromycin (ZITHROMAX) 250 MG tablet    chlorpheniramine-HYDROcodone (TUSSIONEX PENNKINETIC ER) 10-8 MG/5ML SUER        Follow up plan: Return if symptoms worsen or fail to improve, for And regular visit.

## 2015-03-14 ENCOUNTER — Telehealth: Payer: Self-pay

## 2015-03-14 ENCOUNTER — Telehealth: Payer: Self-pay | Admitting: Family Medicine

## 2015-03-14 MED ORDER — AMOXICILLIN-POT CLAVULANATE 875-125 MG PO TABS: 1.0000 | ORAL_TABLET | Freq: Two times a day (BID) | ORAL | Status: DC

## 2015-03-14 NOTE — Telephone Encounter (Signed)
Pt called in and would like to get a refill on tussinex. Pt is aware that this has to be picked but she was unsure if she could get the refill without an appt.

## 2015-03-14 NOTE — Telephone Encounter (Signed)
Pt called stated she still feel horrible. Wants to know if more medication can be called in for her. Pt informed she would most likely need to schedule an appt. Pharm is [facility]. Please call pt with any issues. Thanks.

## 2015-03-15 MED ORDER — HYDROCOD POLST-CPM POLST ER 10-8 MG/5ML PO SUER: 5.0000 mL | Freq: Two times a day (BID) | ORAL | Status: DC

## 2015-03-15 NOTE — Telephone Encounter (Signed)
This request came in after Dr. Jeananne Rama had written another ABX and then left the office. Will you review refill of Tussionex please - [facility]

## 2015-03-15 NOTE — Telephone Encounter (Signed)
Patient will come pick up

## 2015-03-15 NOTE — Telephone Encounter (Signed)
OK for her to come pick up.

## 2015-04-18 ENCOUNTER — Telehealth: Payer: Self-pay

## 2015-04-18 ENCOUNTER — Other Ambulatory Visit: Payer: Self-pay | Admitting: Family Medicine

## 2015-04-18 MED ORDER — LORAZEPAM 0.5 MG PO TABS: 0.5000 mg | ORAL_TABLET | Freq: Two times a day (BID) | ORAL | Status: DC | PRN

## 2015-04-18 NOTE — Telephone Encounter (Signed)
Patient would like refill of Lorazepam She has appointment scheduled for 05/09/15

## 2015-05-09 ENCOUNTER — Encounter: Payer: Self-pay | Admitting: Family Medicine

## 2015-05-09 ENCOUNTER — Ambulatory Visit (INDEPENDENT_AMBULATORY_CARE_PROVIDER_SITE_OTHER): Payer: BLUE CROSS/BLUE SHIELD | Admitting: Family Medicine

## 2015-05-09 VITALS — BP 101/67 | HR 90 | Temp 98.4°F | Ht 67.7 in | Wt 178.0 lb

## 2015-05-09 DIAGNOSIS — F419 Anxiety disorder, unspecified: Secondary | ICD-10-CM | POA: Insufficient documentation

## 2015-05-09 DIAGNOSIS — Z23 Encounter for immunization: Secondary | ICD-10-CM

## 2015-05-09 DIAGNOSIS — L309 Dermatitis, unspecified: Secondary | ICD-10-CM | POA: Diagnosis not present

## 2015-05-09 MED ORDER — CLOBETASOL PROPIONATE 0.05 % EX CREA: 1.0000 "application " | TOPICAL_CREAM | Freq: Two times a day (BID) | CUTANEOUS | Status: DC

## 2015-05-09 NOTE — Progress Notes (Signed)
BP 101/67 mmHg  Pulse 90  Temp(Src) 98.4 F (36.9 C)  Ht 5' 7.7" (1.72 m)  Wt 178 lb (80.74 kg)  BMI 27.29 kg/m2  SpO2 97%  LMP 04/08/2015 (Approximate)   Subjective:    Patient ID: Jamie [name], female    DOB: 1970/12/26, 44 y.o.   MRN: [id number]  HPI: Jamie [name] is a 44 y.o. female  Chief Complaint  Patient presents with  . Follow-up    medication   patient doing much better as working a job at [employer] and doing better pain management is noticing medication taken lorazepam half a milligram twice a day most days.  For dyshidrosis also doing well with Temovate cream and wants a refill.  Relevant past medical, surgical, family and social history reviewed and updated as indicated. Interim medical history since our last visit reviewed. Allergies and medications reviewed and updated.  Review of Systems  Constitutional: Negative.   Respiratory: Negative.   Cardiovascular: Negative.     Per HPI unless specifically indicated above     Objective:    BP 101/67 mmHg  Pulse 90  Temp(Src) 98.4 F (36.9 C)  Ht 5' 7.7" (1.72 m)  Wt 178 lb (80.74 kg)  BMI 27.29 kg/m2  SpO2 97%  LMP 04/08/2015 (Approximate)  Wt Readings from Last 3 Encounters:  05/09/15 178 lb (80.74 kg)  03/08/15 170 lb (77.111 kg)  09/01/14 172 lb 11 oz (78.331 kg)    Physical Exam  Constitutional: She is oriented to person, place, and time. She appears well-developed and well-nourished. No distress.  HENT:  Head: Normocephalic and atraumatic.  Right Ear: Hearing normal.  Left Ear: Hearing normal.  Nose: Nose normal.  Eyes: Conjunctivae and lids are normal. Right eye exhibits no discharge. Left eye exhibits no discharge. No scleral icterus.  Cardiovascular: Normal rate, regular rhythm and normal heart sounds.   Pulmonary/Chest: Effort normal and breath sounds normal. No respiratory distress.  Musculoskeletal: Normal range of motion.  Neurological: She is alert and oriented to person,  place, and time.  Skin: Skin is intact. No rash noted.  Changes of dyshidrosis  Psychiatric: She has a normal mood and affect. Her speech is normal and behavior is normal. Judgment and thought content normal. Cognition and memory are normal.    Results for orders placed or performed during the hospital encounter of 08/26/14  Surgical pcr screen  Result Value Ref Range   MRSA, PCR NEGATIVE NEGATIVE   Staphylococcus aureus POSITIVE (A) NEGATIVE  Basic metabolic panel  Result Value Ref Range   Sodium 142 135 - 145 mmol/L   Potassium 4.4 3.5 - 5.1 mmol/L   Chloride 107 96 - 112 mEq/L   CO2 27 19 - 32 mmol/L   Glucose, Bld 115 (H) 70 - 99 mg/dL   BUN 6 6 - 23 mg/dL   Creatinine, Ser 0.72 0.50 - 1.10 mg/dL   Calcium 9.5 8.4 - 10.5 mg/dL   GFR calc non Af Amer >90 >90 mL/min   GFR calc Af Amer >90 >90 mL/min   Anion gap 8 5 - 15  CBC  Result Value Ref Range   WBC 7.9 4.0 - 10.5 K/uL   RBC 4.17 3.87 - 5.11 MIL/uL   Hemoglobin 13.3 12.0 - 15.0 g/dL   HCT 40.1 36.0 - 46.0 %   MCV 96.2 78.0 - 100.0 fL   MCH 31.9 26.0 - 34.0 pg   MCHC 33.2 30.0 - 36.0 g/dL   RDW 13.0 11.5 -  15.5 %   Platelets 213 150 - 400 K/uL  hCG, serum, qualitative  Result Value Ref Range   Preg, Serum NEGATIVE NEGATIVE  Type and screen  Result Value Ref Range   ABO/RH(D) A POS    Antibody Screen NEG    Sample Expiration 09/09/2014   ABO/Rh  Result Value Ref Range   ABO/RH(D) A POS       Assessment & Plan:   Problem List Items Addressed This Visit      Musculoskeletal and Integument   Eczema - Primary     Other   Anxiety    .The current medical regimen is effective;  continue present plan and medications.       Relevant Medications   citalopram (CELEXA) 40 MG tablet       Follow up plan: Return in about 3 months (around 08/08/2015), or if symptoms worsen or fail to improve, for Physical Exam.

## 2015-05-09 NOTE — Assessment & Plan Note (Signed)
The current medical regimen is effective;  continue present plan and medications.  

## 2015-05-09 NOTE — Addendum Note (Signed)
Addended by: Wynn Maudlin on: 05/09/2015 03:27 PM   Modules accepted: Orders

## 2015-06-16 ENCOUNTER — Telehealth: Payer: Self-pay | Admitting: Family Medicine

## 2015-06-16 ENCOUNTER — Other Ambulatory Visit: Payer: Self-pay | Admitting: Family Medicine

## 2015-06-16 MED ORDER — LORAZEPAM 0.5 MG PO TABS: 0.5000 mg | ORAL_TABLET | Freq: Two times a day (BID) | ORAL | Status: DC | PRN

## 2015-06-16 NOTE — Telephone Encounter (Signed)
rx printed

## 2015-06-16 NOTE — Telephone Encounter (Signed)
Pt came by stated she needs a refill on Lorazepam. Please call when RX is ready for pick up. Thanks.

## 2015-08-04 ENCOUNTER — Other Ambulatory Visit (HOSPITAL_COMMUNITY): Payer: Self-pay | Admitting: Neurosurgery

## 2015-08-05 NOTE — Pre-Procedure Instructions (Addendum)
    Jamie [name]  08/05/2015     Your procedure is scheduled on Thursday, December 22.  Report to [facility] Admitting at 2:00 PM.               Your surgery or procedure is scheduled for 4:00 PM   Call this number if you have problems the morning of surgery:[phone]                  For any other questions, please call [phone], Monday - Friday 8 AM - 4 PM.   Remember:  Do not eat food or drink liquids after midnight Wednesday, December 21.   Take these medicines the morning of surgery with A SIP OF WATER :ARIPiprazole (ABILIFY), citalopram (CELEXA), gabapentin (NEURONTIN), levETIRAcetam (KEPPRA), morphine (KADIAN), venlafaxine XR (EFFEXOR-XR).               STOP taking Vitamins and Herbal Medications, Do Not take any Aspirin, Aspirin Products, Ibuprofen ( Advil), Naproxen (Aleve).             If blood sugar less than 70 may have 1/2 cup clear juice - apple or cranberry.  Recheck CBG in 15 minutes, if it is still less than 70 drink an additional 1/2 cup of clear juice and call pre op - [phone]. Prepare to come to hospital earlier than scheduled if instructed and bring CBG machine and Glucose tablets with you. Note what time you take fluids.   Do not wear jewelry, make-up or nail polish.  Do not wear lotions, powders, or perfumes.    Do not shave 48 hours prior to surgery.    Do not bring valuables to the hospital.  [facility] is not responsible for any belongings or valuables.  Contacts, dentures or bridgework may not be worn into surgery.  Leave your suitcase in the car.  After surgery it may be brought to your room.  For patients admitted to the hospital, discharge time will be determined by your treatment team.  Special instructions:  Review  [facility] - Preparing For Surgery.  Please read over the following fact sheets that you were given. Pain Booklet, Coughing and Deep Breathing, Blood Transfusion Information and Surgical Site Infection  Prevention

## 2015-08-08 ENCOUNTER — Encounter (HOSPITAL_COMMUNITY): Payer: Self-pay

## 2015-08-08 ENCOUNTER — Encounter (HOSPITAL_COMMUNITY)
Admission: RE | Admit: 2015-08-08 | Discharge: 2015-08-08 | Disposition: A | Discharge status: Home / Self Care | Payer: BLUE CROSS/BLUE SHIELD | Source: Ambulatory Visit | Attending: Neurosurgery | Admitting: Neurosurgery

## 2015-08-08 DIAGNOSIS — Z029 Encounter for administrative examinations, unspecified: Secondary | ICD-10-CM | POA: Insufficient documentation

## 2015-08-08 LAB — BASIC METABOLIC PANEL
ANION GAP: 8 (ref 5–15)
BUN: 10 mg/dL (ref 6–20)
CO2: 24 mmol/L (ref 22–32)
Calcium: 9.5 mg/dL (ref 8.9–10.3)
Chloride: 108 mmol/L (ref 101–111)
Creatinine, Ser: 0.64 mg/dL (ref 0.44–1.00)
GFR calc Af Amer: >60 mL/min (ref >60)
GLUCOSE: 103 mg/dL — AB (ref 65–99)
POTASSIUM: 4 mmol/L (ref 3.5–5.1)
SODIUM: 140 mmol/L (ref 135–145)

## 2015-08-08 LAB — CBC
HEMATOCRIT: 43.7 % (ref 36.0–46.0)
HEMOGLOBIN: 14.6 g/dL (ref 12.0–15.0)
MCH: 32.6 pg (ref 26.0–34.0)
MCHC: 33.4 g/dL (ref 30.0–36.0)
MCV: 97.5 fL (ref 78.0–100.0)
Platelets: 293 10*3/uL (ref 150–400)
RBC: 4.48 MIL/uL (ref 3.87–5.11)
RDW: 13.7 % (ref 11.5–15.5)
WBC: 8.4 10*3/uL (ref 4.0–10.5)

## 2015-08-08 LAB — SURGICAL PCR SCREEN
MRSA, PCR: NEGATIVE
STAPHYLOCOCCUS AUREUS: NEGATIVE

## 2015-08-08 LAB — TYPE AND SCREEN
ABO/RH(D): A POS
ANTIBODY SCREEN: NEGATIVE

## 2015-08-08 LAB — HCG, SERUM, QUALITATIVE: Preg, Serum: NEGATIVE

## 2015-08-08 NOTE — Progress Notes (Signed)
Jamie [name] reports a history of Hypoglycemia.  "It hasn't been a problem in a while."  I spoke with [name] PA and was instructed to instruct patient on Hypoglycemia protocol.  Patient has access to a CBG machine and Glucose tablets.  I instructed her to eat a snack high in protein prior to going to bed. Patient states that she has "ymptoms prior to crashing."  I asked patient to check CBG and if it is less than 70 to drink 1/2 cup of apple or cranberry juice, then recheck CBG in 15 minutes.  If  CBG still less than 70 to drink additional 1/2 cup of clear juice and call the pre op number I provided to her and to be prepared to come to the hospital early. ( takes 1 hour to get to the hospital from her home.)  I instructed patient to bring CBG machine and Glucose tablets with her.  I also instructed patient to keep a record of what time she took fluid.  I verbalized understanding of information.

## 2015-08-09 ENCOUNTER — Encounter: Payer: Self-pay | Admitting: Family Medicine

## 2015-08-09 ENCOUNTER — Other Ambulatory Visit: Payer: Self-pay

## 2015-08-09 ENCOUNTER — Other Ambulatory Visit: Payer: Self-pay | Admitting: Family Medicine

## 2015-08-09 ENCOUNTER — Ambulatory Visit (INDEPENDENT_AMBULATORY_CARE_PROVIDER_SITE_OTHER): Payer: BLUE CROSS/BLUE SHIELD | Admitting: Family Medicine

## 2015-08-09 VITALS — BP 117/74 | HR 83 | Temp 98.8°F | Ht 67.0 in | Wt 172.0 lb

## 2015-08-09 DIAGNOSIS — F329 Major depressive disorder, single episode, unspecified: Secondary | ICD-10-CM | POA: Diagnosis not present

## 2015-08-09 DIAGNOSIS — L309 Dermatitis, unspecified: Secondary | ICD-10-CM | POA: Diagnosis not present

## 2015-08-09 DIAGNOSIS — Z0000 Encounter for general adult medical examination without abnormal findings: Secondary | ICD-10-CM

## 2015-08-09 DIAGNOSIS — M4806 Spinal stenosis, lumbar region: Secondary | ICD-10-CM

## 2015-08-09 DIAGNOSIS — Z124 Encounter for screening for malignant neoplasm of cervix: Secondary | ICD-10-CM

## 2015-08-09 DIAGNOSIS — Z1239 Encounter for other screening for malignant neoplasm of breast: Secondary | ICD-10-CM | POA: Diagnosis not present

## 2015-08-09 DIAGNOSIS — F32A Depression, unspecified: Secondary | ICD-10-CM

## 2015-08-09 DIAGNOSIS — M48061 Spinal stenosis, lumbar region without neurogenic claudication: Secondary | ICD-10-CM

## 2015-08-09 LAB — HM PAP SMEAR: HM Pap smear: NEGATIVE

## 2015-08-09 MED ORDER — LORAZEPAM 0.5 MG PO TABS: 0.5000 mg | ORAL_TABLET | Freq: Two times a day (BID) | ORAL | Status: DC | PRN

## 2015-08-09 NOTE — Assessment & Plan Note (Signed)
Surgery pending this week

## 2015-08-09 NOTE — Assessment & Plan Note (Signed)
The current medical regimen is effective;  continue present plan and medications.  

## 2015-08-09 NOTE — Progress Notes (Signed)
BP 117/74 mmHg  Pulse 83  Temp(Src) 98.8 F (37.1 C)  Ht 5\' 7"  (1.702 m)  Wt 172 lb (78.019 kg)  BMI 26.93 kg/m2  SpO2 99%  LMP 07/26/2015 (Approximate)   Subjective:    Patient ID: Jamie [name], female    DOB: 03-29-1971, 44 y.o.   MRN: [id number]  HPI: Jamie [name] is a 44 y.o. female  Chief Complaint  Patient presents with  . 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 Patient's dyshidrosis not getting better in spite of using clobetasol cream patient uses gloves as been to dermatology and given the same medication Nerves with Abilify and lorazepam and trazodone venlafaxine are stable at best [place] stressful with pending surgery and being in the hospital over Christmas Patient's pain control is poor but the best she can get for now especially with pending surgery.  Relevant past medical, surgical, family and social history reviewed and updated as indicated. Interim medical history since our last visit reviewed. Allergies and medications reviewed and updated.  Review of Systems  Constitutional: Negative.   HENT: Negative.   Eyes: Negative.   Respiratory: Negative.   Cardiovascular: Negative.   Gastrointestinal: Negative.   Endocrine: Negative.   Genitourinary: Negative.   Musculoskeletal: Negative.   Skin: Negative.   Allergic/Immunologic: Negative.   Neurological: Negative.   Hematological: Negative.   Psychiatric/Behavioral: Negative.     Per HPI unless specifically indicated above     Objective:    BP 117/74 mmHg  Pulse 83  Temp(Src) 98.8 F (37.1 C)  Ht 5\' 7"  (1.702 m)  Wt 172 lb (78.019 kg)  BMI 26.93 kg/m2  SpO2 99%  LMP 07/26/2015 (Approximate)  Wt Readings from Last 3 Encounters:  08/09/15 172 lb (78.019 kg)  08/08/15 175 lb 9.6 oz (79.652 kg)  05/09/15 178 lb (80.74 kg)    Physical Exam  Constitutional: She is oriented to person, place, and time. She  appears well-developed and well-nourished.  HENT:  Head: Normocephalic and atraumatic.  Right Ear: External ear normal.  Left Ear: External ear normal.  Nose: Nose normal.  Mouth/Throat: Oropharynx is clear and moist.  Eyes: Conjunctivae and EOM are normal. Pupils are equal, round, and reactive to light.  Neck: Normal range of motion. Neck supple. Carotid bruit is not present.  Cardiovascular: Normal rate, regular rhythm and normal heart sounds.   No murmur heard. Pulmonary/Chest: Effort normal and breath sounds normal. She exhibits no mass. Right breast exhibits no mass, no skin change and no tenderness. Left breast exhibits no mass, no skin change and no tenderness. Breasts are symmetrical.  Abdominal: Soft. Bowel sounds are normal. There is no hepatosplenomegaly.  Genitourinary: Vagina normal and uterus normal.  Pap smear done  Musculoskeletal: Normal range of motion.  Back pain and decreased range of motion  Neurological: She is alert and oriented to person, place, and time.  Skin: No rash noted.  Psychiatric: She has a normal mood and affect. Her behavior is normal. Judgment and thought content normal.    Results for orders placed or performed during the hospital encounter of 08/08/15  Surgical pcr screen  Result Value Ref Range   MRSA, PCR NEGATIVE NEGATIVE   Staphylococcus aureus NEGATIVE NEGATIVE  Basic metabolic panel  Result Value Ref Range   Sodium 140 135 - 145 mmol/L   Potassium 4.0 3.5 - 5.1 mmol/L   Chloride 108 101 - 111 mmol/L  CO2 24 22 - 32 mmol/L   Glucose, Bld 103 (H) 65 - 99 mg/dL   BUN 10 6 - 20 mg/dL   Creatinine, Ser 0.64 0.44 - 1.00 mg/dL   Calcium 9.5 8.9 - 10.3 mg/dL   GFR calc non Af Amer >60 >60 mL/min   GFR calc Af Amer >60 >60 mL/min   Anion gap 8 5 - 15  CBC  Result Value Ref Range   WBC 8.4 4.0 - 10.5 K/uL   RBC 4.48 3.87 - 5.11 MIL/uL   Hemoglobin 14.6 12.0 - 15.0 g/dL   HCT 43.7 36.0 - 46.0 %   MCV 97.5 78.0 - 100.0 fL   MCH 32.6  26.0 - 34.0 pg   MCHC 33.4 30.0 - 36.0 g/dL   RDW 13.7 11.5 - 15.5 %   Platelets 293 150 - 400 K/uL  hCG, serum, qualitative  Result Value Ref Range   Preg, Serum NEGATIVE NEGATIVE  Type and screen All Cardiac and thoracic surgeries, spinal fusions, myomectomies, craniotomies, colon & liver resections, total joint revisions, same day c-section with placenta previa or accreta.  Result Value Ref Range   ABO/RH(D) A POS    Antibody Screen NEG    Sample Expiration 08/22/2015    Extend sample reason NO TRANSFUSIONS OR PREGNANCY IN THE PAST 3 MONTHS       Assessment & Plan:   Problem List Items Addressed This Visit      Musculoskeletal and Integument   Eczema    Discussed care and treatment of hands will continue using cream         Other   Spinal stenosis of lumbar region    Surgery pending this week      Depression    The current medical regimen is effective;  continue present plan and medications.       Relevant Medications   LORazepam (ATIVAN) 0.5 MG tablet    Other Visit Diagnoses    Breast cancer screening    -  Primary    Relevant Orders    MM Digital Screening    PE (physical exam), annual          discuss no blood work is patient's had preoperative blood work see copy in chart also had blood work through psychiatry which is not available now but all normal.  Follow up plan: Return in about 6 months (around 02/07/2016), or if symptoms worsen or fail to improve.

## 2015-08-09 NOTE — Assessment & Plan Note (Signed)
Discussed care and treatment of hands will continue using cream

## 2015-08-10 MED ORDER — DEXAMETHASONE SODIUM PHOSPHATE 10 MG/ML IJ SOLN
10.0000 mg | INTRAMUSCULAR | Status: AC
  Administered 2015-08-11: 10 mg via INTRAVENOUS
  Filled 2015-08-10: qty 1

## 2015-08-10 MED ORDER — CEFAZOLIN SODIUM-DEXTROSE 2-3 GM-% IV SOLR
2.0000 g | INTRAVENOUS | Status: AC
  Administered 2015-08-11: 2 g via INTRAVENOUS
  Filled 2015-08-10: qty 50

## 2015-08-11 ENCOUNTER — Encounter (HOSPITAL_COMMUNITY)
Admission: RE | Disposition: A | Discharge status: Home / Self Care | Payer: Self-pay | Source: Ambulatory Visit | Attending: Neurosurgery

## 2015-08-11 ENCOUNTER — Encounter (HOSPITAL_COMMUNITY): Payer: Self-pay | Admitting: *Deleted

## 2015-08-11 ENCOUNTER — Inpatient Hospital Stay (HOSPITAL_COMMUNITY): Payer: BLUE CROSS/BLUE SHIELD

## 2015-08-11 ENCOUNTER — Inpatient Hospital Stay (HOSPITAL_COMMUNITY): Payer: BLUE CROSS/BLUE SHIELD | Admitting: Vascular Surgery

## 2015-08-11 ENCOUNTER — Inpatient Hospital Stay (HOSPITAL_COMMUNITY): Payer: BLUE CROSS/BLUE SHIELD | Admitting: Anesthesiology

## 2015-08-11 ENCOUNTER — Inpatient Hospital Stay (HOSPITAL_COMMUNITY)
Admission: RE | Admit: 2015-08-11 | Discharge: 2015-08-14 | DRG: 460 | Disposition: A | Discharge status: Home / Self Care | Payer: BLUE CROSS/BLUE SHIELD | Source: Ambulatory Visit | Attending: Neurosurgery | Admitting: Neurosurgery

## 2015-08-11 DIAGNOSIS — M960 Pseudarthrosis after fusion or arthrodesis: Secondary | ICD-10-CM | POA: Diagnosis present

## 2015-08-11 DIAGNOSIS — F17200 Nicotine dependence, unspecified, uncomplicated: Secondary | ICD-10-CM | POA: Diagnosis present

## 2015-08-11 DIAGNOSIS — G4700 Insomnia, unspecified: Secondary | ICD-10-CM | POA: Diagnosis present

## 2015-08-11 DIAGNOSIS — M549 Dorsalgia, unspecified: Secondary | ICD-10-CM | POA: Diagnosis present

## 2015-08-11 DIAGNOSIS — Z79899 Other long term (current) drug therapy: Secondary | ICD-10-CM | POA: Diagnosis not present

## 2015-08-11 DIAGNOSIS — L309 Dermatitis, unspecified: Secondary | ICD-10-CM | POA: Diagnosis present

## 2015-08-11 DIAGNOSIS — R402363 Coma scale, best motor response, obeys commands, at hospital admission: Secondary | ICD-10-CM | POA: Diagnosis present

## 2015-08-11 DIAGNOSIS — M419 Scoliosis, unspecified: Secondary | ICD-10-CM | POA: Diagnosis present

## 2015-08-11 DIAGNOSIS — E559 Vitamin D deficiency, unspecified: Secondary | ICD-10-CM | POA: Diagnosis present

## 2015-08-11 DIAGNOSIS — F410 Panic disorder [episodic paroxysmal anxiety] without agoraphobia: Secondary | ICD-10-CM | POA: Diagnosis present

## 2015-08-11 DIAGNOSIS — M1990 Unspecified osteoarthritis, unspecified site: Secondary | ICD-10-CM | POA: Diagnosis present

## 2015-08-11 DIAGNOSIS — R402143 Coma scale, eyes open, spontaneous, at hospital admission: Secondary | ICD-10-CM | POA: Diagnosis present

## 2015-08-11 DIAGNOSIS — Z888 Allergy status to other drugs, medicaments and biological substances status: Secondary | ICD-10-CM

## 2015-08-11 DIAGNOSIS — S32009K Unspecified fracture of unspecified lumbar vertebra, subsequent encounter for fracture with nonunion: Secondary | ICD-10-CM | POA: Diagnosis present

## 2015-08-11 DIAGNOSIS — R402253 Coma scale, best verbal response, oriented, at hospital admission: Secondary | ICD-10-CM | POA: Diagnosis present

## 2015-08-11 DIAGNOSIS — F329 Major depressive disorder, single episode, unspecified: Secondary | ICD-10-CM | POA: Diagnosis present

## 2015-08-11 DIAGNOSIS — Z91013 Allergy to seafood: Secondary | ICD-10-CM

## 2015-08-11 DIAGNOSIS — Z79891 Long term (current) use of opiate analgesic: Secondary | ICD-10-CM | POA: Diagnosis not present

## 2015-08-11 DIAGNOSIS — Z419 Encounter for procedure for purposes other than remedying health state, unspecified: Secondary | ICD-10-CM

## 2015-08-11 SURGERY — POSTERIOR LUMBAR FUSION 1 LEVEL
Anesthesia: General | Site: Spine Lumbar

## 2015-08-11 MED ORDER — THROMBIN 20000 UNITS EX SOLR
CUTANEOUS | Status: DC | PRN
  Administered 2015-08-11 (×2): via TOPICAL

## 2015-08-11 MED ORDER — ONDANSETRON HCL 4 MG/2ML IJ SOLN: 4.0000 mg | Freq: Four times a day (QID) | INTRAMUSCULAR | Status: DC | PRN

## 2015-08-11 MED ORDER — LORAZEPAM 0.5 MG PO TABS: 0.5000 mg | ORAL_TABLET | Freq: Two times a day (BID) | ORAL | Status: DC | PRN

## 2015-08-11 MED ORDER — MORPHINE SULFATE ER 60 MG PO CP24: 30.0000 mg | ORAL_CAPSULE | Freq: Every day | ORAL | Status: DC

## 2015-08-11 MED ORDER — LIDOCAINE HCL (CARDIAC) 20 MG/ML IV SOLN
INTRAVENOUS | Status: DC | PRN
  Administered 2015-08-11: 50 mg via INTRAVENOUS

## 2015-08-11 MED ORDER — GABAPENTIN 400 MG PO CAPS
800.0000 mg | ORAL_CAPSULE | Freq: Three times a day (TID) | ORAL | Status: DC
  Administered 2015-08-11 – 2015-08-14 (×8): 800 mg via ORAL
  Filled 2015-08-11 (×8): qty 2

## 2015-08-11 MED ORDER — BUPIVACAINE LIPOSOME 1.3 % IJ SUSP
20.0000 mL | INTRAMUSCULAR | Status: AC
  Filled 2015-08-11: qty 20

## 2015-08-11 MED ORDER — LEVETIRACETAM 250 MG PO TABS
250.0000 mg | ORAL_TABLET | Freq: Two times a day (BID) | ORAL | Status: DC
  Administered 2015-08-12 – 2015-08-14 (×5): 250 mg via ORAL
  Filled 2015-08-11 (×6): qty 1

## 2015-08-11 MED ORDER — TRAZODONE HCL 100 MG PO TABS
100.0000 mg | ORAL_TABLET | Freq: Every day | ORAL | Status: DC
  Administered 2015-08-12 – 2015-08-13 (×2): 100 mg via ORAL
  Filled 2015-08-11 (×3): qty 1

## 2015-08-11 MED ORDER — NEOSTIGMINE METHYLSULFATE 10 MG/10ML IV SOLN
INTRAVENOUS | Status: DC | PRN
  Administered 2015-08-11: 3 mg via INTRAVENOUS

## 2015-08-11 MED ORDER — VANCOMYCIN HCL 1000 MG IV SOLR
INTRAVENOUS | Status: AC
  Filled 2015-08-11: qty 1000

## 2015-08-11 MED ORDER — ACETAMINOPHEN 325 MG PO TABS: 650.0000 mg | ORAL_TABLET | ORAL | Status: DC | PRN

## 2015-08-11 MED ORDER — MIDAZOLAM HCL 2 MG/2ML IJ SOLN
INTRAMUSCULAR | Status: AC
  Filled 2015-08-11: qty 2

## 2015-08-11 MED ORDER — VENLAFAXINE HCL ER 75 MG PO CP24
150.0000 mg | ORAL_CAPSULE | Freq: Every day | ORAL | Status: DC
  Administered 2015-08-12 – 2015-08-14 (×3): 150 mg via ORAL
  Filled 2015-08-11 (×3): qty 2

## 2015-08-11 MED ORDER — ARIPIPRAZOLE 10 MG PO TABS
5.0000 mg | ORAL_TABLET | Freq: Every day | ORAL | Status: DC
  Administered 2015-08-12 – 2015-08-14 (×3): 5 mg via ORAL
  Filled 2015-08-11 (×4): qty 1

## 2015-08-11 MED ORDER — GLYCOPYRROLATE 0.2 MG/ML IJ SOLN
INTRAMUSCULAR | Status: DC | PRN
  Administered 2015-08-11: 0.6 mg via INTRAVENOUS

## 2015-08-11 MED ORDER — METHOCARBAMOL 750 MG PO TABS
750.0000 mg | ORAL_TABLET | Freq: Three times a day (TID) | ORAL | Status: DC | PRN
  Administered 2015-08-13 – 2015-08-14 (×4): 750 mg via ORAL
  Filled 2015-08-11 (×6): qty 1

## 2015-08-11 MED ORDER — PROPOFOL 10 MG/ML IV BOLUS
INTRAVENOUS | Status: DC | PRN
  Administered 2015-08-11: 40 mg via INTRAVENOUS
  Administered 2015-08-11: 160 mg via INTRAVENOUS

## 2015-08-11 MED ORDER — HYDROMORPHONE HCL 1 MG/ML IJ SOLN
INTRAMUSCULAR | Status: AC
  Filled 2015-08-11: qty 1

## 2015-08-11 MED ORDER — SODIUM CHLORIDE 0.9 % IV SOLN: 250.0000 mL | INTRAVENOUS | Status: DC

## 2015-08-11 MED ORDER — MORPHINE SULFATE ER 15 MG PO TBCR
15.0000 mg | EXTENDED_RELEASE_TABLET | Freq: Two times a day (BID) | ORAL | Status: DC
  Administered 2015-08-11 – 2015-08-14 (×6): 15 mg via ORAL
  Filled 2015-08-11 (×6): qty 1

## 2015-08-11 MED ORDER — ONDANSETRON HCL 4 MG/2ML IJ SOLN: 4.0000 mg | INTRAMUSCULAR | Status: DC | PRN

## 2015-08-11 MED ORDER — HYDROMORPHONE HCL 1 MG/ML IJ SOLN
0.5000 mg | INTRAMUSCULAR | Status: DC | PRN
  Administered 2015-08-12 – 2015-08-14 (×8): 1 mg via INTRAVENOUS
  Filled 2015-08-11 (×9): qty 1

## 2015-08-11 MED ORDER — FENTANYL CITRATE (PF) 250 MCG/5ML IJ SOLN
INTRAMUSCULAR | Status: AC
  Filled 2015-08-11: qty 5

## 2015-08-11 MED ORDER — CEFAZOLIN SODIUM-DEXTROSE 2-3 GM-% IV SOLR
2.0000 g | Freq: Three times a day (TID) | INTRAVENOUS | Status: AC
  Administered 2015-08-11 – 2015-08-13 (×5): 2 g via INTRAVENOUS
  Filled 2015-08-11 (×6): qty 50

## 2015-08-11 MED ORDER — BUPIVACAINE LIPOSOME 1.3 % IJ SUSP
INTRAMUSCULAR | Status: DC | PRN
  Administered 2015-08-11: 20 mL

## 2015-08-11 MED ORDER — OXYCODONE HCL 5 MG PO TABS
15.0000 mg | ORAL_TABLET | ORAL | Status: DC | PRN
  Administered 2015-08-12 – 2015-08-14 (×12): 15 mg via ORAL
  Filled 2015-08-11 (×13): qty 3

## 2015-08-11 MED ORDER — MIDAZOLAM HCL 5 MG/5ML IJ SOLN
INTRAMUSCULAR | Status: DC | PRN
  Administered 2015-08-11: 2 mg via INTRAVENOUS

## 2015-08-11 MED ORDER — MENTHOL 3 MG MT LOZG: 1.0000 | LOZENGE | OROMUCOSAL | Status: DC | PRN

## 2015-08-11 MED ORDER — CYCLOBENZAPRINE HCL 10 MG PO TABS
10.0000 mg | ORAL_TABLET | Freq: Three times a day (TID) | ORAL | Status: DC | PRN
Start: 2015-08-11 — End: 2015-08-14
  Filled 2015-08-11 (×2): qty 1

## 2015-08-11 MED ORDER — FENTANYL CITRATE (PF) 100 MCG/2ML IJ SOLN
INTRAMUSCULAR | Status: DC | PRN
  Administered 2015-08-11: 150 ug via INTRAVENOUS
  Administered 2015-08-11: 100 ug via INTRAVENOUS

## 2015-08-11 MED ORDER — ADULT MULTIVITAMIN W/MINERALS CH
1.0000 | ORAL_TABLET | Freq: Every day | ORAL | Status: DC
  Administered 2015-08-12 – 2015-08-14 (×3): 1 via ORAL
  Filled 2015-08-11 (×4): qty 1

## 2015-08-11 MED ORDER — VANCOMYCIN HCL 1000 MG IV SOLR
INTRAVENOUS | Status: DC | PRN
  Administered 2015-08-11: 1000 mg via TOPICAL

## 2015-08-11 MED ORDER — VITAMIN D 1000 UNITS PO TABS
1000.0000 [IU] | ORAL_TABLET | Freq: Every day | ORAL | Status: DC
  Administered 2015-08-12 – 2015-08-14 (×3): 1000 [IU] via ORAL
  Filled 2015-08-11 (×4): qty 1

## 2015-08-11 MED ORDER — SODIUM CHLORIDE 0.9 % IJ SOLN: 3.0000 mL | INTRAMUSCULAR | Status: DC | PRN

## 2015-08-11 MED ORDER — LIDOCAINE-EPINEPHRINE 1 %-1:100000 IJ SOLN
INTRAMUSCULAR | Status: DC | PRN
Start: 2015-08-11 — End: 2015-08-11
  Administered 2015-08-11: 6 mL
  Administered 2015-08-11: 10 mL

## 2015-08-11 MED ORDER — CITALOPRAM HYDROBROMIDE 40 MG PO TABS
40.0000 mg | ORAL_TABLET | Freq: Every day | ORAL | Status: DC
  Administered 2015-08-12 – 2015-08-14 (×3): 40 mg via ORAL
  Filled 2015-08-11 (×4): qty 1

## 2015-08-11 MED ORDER — SODIUM CHLORIDE 0.9 % IR SOLN
Status: DC | PRN
  Administered 2015-08-11: 18:00:00

## 2015-08-11 MED ORDER — LACTATED RINGERS IV SOLN
INTRAVENOUS | Status: DC | PRN
  Administered 2015-08-11 (×2): via INTRAVENOUS

## 2015-08-11 MED ORDER — ACETAMINOPHEN 650 MG RE SUPP: 650.0000 mg | RECTAL | Status: DC | PRN

## 2015-08-11 MED ORDER — OXYCODONE HCL 5 MG/5ML PO SOLN: 5.0000 mg | Freq: Once | ORAL | Status: AC | PRN

## 2015-08-11 MED ORDER — PHENOL 1.4 % MT LIQD: 1.0000 | OROMUCOSAL | Status: DC | PRN

## 2015-08-11 MED ORDER — FLUTICASONE PROPIONATE 50 MCG/ACT NA SUSP
1.0000 | Freq: Every day | NASAL | Status: DC | PRN
  Filled 2015-08-11: qty 16

## 2015-08-11 MED ORDER — SACCHAROMYCES BOULARDII 250 MG PO CAPS
250.0000 mg | ORAL_CAPSULE | Freq: Every day | ORAL | Status: DC
  Administered 2015-08-12 – 2015-08-14 (×3): 250 mg via ORAL
  Filled 2015-08-11 (×4): qty 1

## 2015-08-11 MED ORDER — LACTATED RINGERS IV SOLN
INTRAVENOUS | Status: DC
  Administered 2015-08-11: 15:00:00 via INTRAVENOUS

## 2015-08-11 MED ORDER — OXYCODONE HCL 5 MG PO TABS
ORAL_TABLET | ORAL | Status: AC
Start: 2015-08-11 — End: 2015-08-12
  Filled 2015-08-11: qty 1

## 2015-08-11 MED ORDER — SODIUM CHLORIDE 0.9 % IJ SOLN
3.0000 mL | Freq: Two times a day (BID) | INTRAMUSCULAR | Status: DC
  Administered 2015-08-12 – 2015-08-14 (×4): 3 mL via INTRAVENOUS

## 2015-08-11 MED ORDER — OXYCODONE HCL 5 MG PO TABS
5.0000 mg | ORAL_TABLET | Freq: Once | ORAL | Status: AC | PRN
  Administered 2015-08-11: 5 mg via ORAL

## 2015-08-11 MED ORDER — ROCURONIUM BROMIDE 100 MG/10ML IV SOLN
INTRAVENOUS | Status: DC | PRN
  Administered 2015-08-11: 50 mg via INTRAVENOUS

## 2015-08-11 MED ORDER — 0.9 % SODIUM CHLORIDE (POUR BTL) OPTIME
TOPICAL | Status: DC | PRN
  Administered 2015-08-11: 1000 mL

## 2015-08-11 MED ORDER — HYDROMORPHONE HCL 1 MG/ML IJ SOLN
INTRAMUSCULAR | Status: AC
Start: 2015-08-11 — End: 2015-08-12
  Filled 2015-08-11: qty 1

## 2015-08-11 MED ORDER — CLOBETASOL PROPIONATE 0.05 % EX CREA
1.0000 "application " | TOPICAL_CREAM | Freq: Two times a day (BID) | CUTANEOUS | Status: DC | PRN
  Filled 2015-08-11: qty 15

## 2015-08-11 MED ORDER — HYDROMORPHONE HCL 1 MG/ML IJ SOLN
0.2500 mg | INTRAMUSCULAR | Status: DC | PRN
  Administered 2015-08-11 (×4): 0.5 mg via INTRAVENOUS

## 2015-08-11 MED ORDER — ONDANSETRON HCL 4 MG/2ML IJ SOLN
INTRAMUSCULAR | Status: DC | PRN
  Administered 2015-08-11: 4 mg via INTRAVENOUS

## 2015-08-11 SURGICAL SUPPLY — 61 items
BAG DECANTER FOR FLEXI CONT (MISCELLANEOUS) ×2 IMPLANT
BENZOIN TINCTURE PRP APPL 2/3 (GAUZE/BANDAGES/DRESSINGS) ×2 IMPLANT
BLADE CLIPPER SURG (BLADE) IMPLANT
BLADE SURG 11 STRL SS (BLADE) ×2 IMPLANT
BONE ALLOSTEM MORSELIZED 5CC (Bone Implant) ×2 IMPLANT
BRUSH SCRUB EZ PLAIN DRY (MISCELLANEOUS) ×2 IMPLANT
BUR MATCHSTICK NEURO 3.0 LAGG (BURR) ×2 IMPLANT
BUR PRECISION FLUTE 6.0 (BURR) ×2 IMPLANT
CANISTER SUCT 3000ML PPV (MISCELLANEOUS) ×2 IMPLANT
CAP LOCKING (Cap) ×4 IMPLANT
CAP LOCKING 5.5 CREO (Cap) ×4 IMPLANT
CONT SPEC 4OZ CLIKSEAL STRL BL (MISCELLANEOUS) ×2 IMPLANT
COVER BACK TABLE 60X90IN (DRAPES) ×2 IMPLANT
DECANTER SPIKE VIAL GLASS SM (MISCELLANEOUS) IMPLANT
DRAPE C-ARM 42X72 X-RAY (DRAPES) ×2 IMPLANT
DRAPE C-ARMOR (DRAPES) ×2 IMPLANT
DRAPE LAPAROTOMY 100X72X124 (DRAPES) ×2 IMPLANT
DRAPE POUCH INSTRU U-SHP 10X18 (DRAPES) ×2 IMPLANT
DRAPE PROXIMA HALF (DRAPES) IMPLANT
DRAPE SURG 17X23 STRL (DRAPES) ×2 IMPLANT
DRSG OPSITE 4X5.5 SM (GAUZE/BANDAGES/DRESSINGS) ×2 IMPLANT
DRSG OPSITE POSTOP 4X6 (GAUZE/BANDAGES/DRESSINGS) ×4 IMPLANT
DURAPREP 26ML APPLICATOR (WOUND CARE) ×2 IMPLANT
ELECT REM PT RETURN 9FT ADLT (ELECTROSURGICAL) ×2
ELECTRODE REM PT RTRN 9FT ADLT (ELECTROSURGICAL) ×1 IMPLANT
EVACUATOR 3/16  PVC DRAIN (DRAIN)
EVACUATOR 3/16 PVC DRAIN (DRAIN) IMPLANT
GAUZE SPONGE 4X4 12PLY STRL (GAUZE/BANDAGES/DRESSINGS) ×2 IMPLANT
GAUZE SPONGE 4X4 16PLY XRAY LF (GAUZE/BANDAGES/DRESSINGS) IMPLANT
GLOVE BIO SURGEON STRL SZ8 (GLOVE) ×4 IMPLANT
GLOVE ECLIPSE 7.5 STRL STRAW (GLOVE) IMPLANT
GLOVE INDICATOR 8.5 STRL (GLOVE) ×4 IMPLANT
GOWN STRL REUS W/ TWL LRG LVL3 (GOWN DISPOSABLE) ×1 IMPLANT
GOWN STRL REUS W/ TWL XL LVL3 (GOWN DISPOSABLE) ×2 IMPLANT
GOWN STRL REUS W/TWL 2XL LVL3 (GOWN DISPOSABLE) IMPLANT
GOWN STRL REUS W/TWL LRG LVL3 (GOWN DISPOSABLE) ×1
GOWN STRL REUS W/TWL XL LVL3 (GOWN DISPOSABLE) ×2
KIT BASIN OR (CUSTOM PROCEDURE TRAY) ×2 IMPLANT
KIT INFUSE X SMALL 1.4CC (Orthopedic Implant) ×2 IMPLANT
KIT ROOM TURNOVER OR (KITS) ×2 IMPLANT
LIQUID BAND (GAUZE/BANDAGES/DRESSINGS) ×2 IMPLANT
NEEDLE HYPO 25X1 1.5 SAFETY (NEEDLE) ×2 IMPLANT
NS IRRIG 1000ML POUR BTL (IV SOLUTION) ×2 IMPLANT
PACK LAMINECTOMY NEURO (CUSTOM PROCEDURE TRAY) ×2 IMPLANT
PAD ARMBOARD 7.5X6 YLW CONV (MISCELLANEOUS) ×6 IMPLANT
ROD CREO 45MM SPINAL (Rod) ×2 IMPLANT
ROD CREO 50MM (Rod) ×2 IMPLANT
SCREW 7.5/6.5X40MM (Screw) ×2 IMPLANT
SCREW MOD 6.0-5.0X35MM (Screw) ×2 IMPLANT
SPONGE LAP 4X18 X RAY DECT (DISPOSABLE) IMPLANT
SPONGE SURGIFOAM ABS GEL 100 (HEMOSTASIS) ×4 IMPLANT
STRIP CLOSURE SKIN 1/2X4 (GAUZE/BANDAGES/DRESSINGS) ×2 IMPLANT
SUT VIC AB 0 CT1 18XCR BRD8 (SUTURE) ×2 IMPLANT
SUT VIC AB 0 CT1 8-18 (SUTURE) ×2
SUT VIC AB 2-0 CT1 18 (SUTURE) ×2 IMPLANT
SUT VIC AB 4-0 PS2 27 (SUTURE) ×2 IMPLANT
TOWEL OR 17X24 6PK STRL BLUE (TOWEL DISPOSABLE) ×2 IMPLANT
TOWEL OR 17X26 10 PK STRL BLUE (TOWEL DISPOSABLE) ×2 IMPLANT
TRAY FOLEY W/METER SILVER 14FR (SET/KITS/TRAYS/PACK) ×2 IMPLANT
TULIP CREP AMP 5.5MM (Orthopedic Implant) ×4 IMPLANT
WATER STERILE IRR 1000ML POUR (IV SOLUTION) ×2 IMPLANT

## 2015-08-11 NOTE — Op Note (Signed)
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  #2 replacement of left S1 cortical screw with a larger 65-75 x 40 mm cortical screw and placement of right-sided sacral alar screw 60/50 x 35  #3 through separate skin incision harvesting of left iliac crest bone graft  #4 posterior lateral arthrodesis L5-S1 utilizing the iliac crest bone graft mixed with allostem and BMP  Surgeon: Dominica Severin Elliot Simoneaux  Asst.: Leeroy Cha  Anesthesia: Gen.  EBL: Minimal  History of present illness: Patient is a very pleasant 14 of them a dizzy undergone L4-S1 fusion back last January this he did very well however over the last several months with progressively worsening low back pain workup revealed loosening of her S1 cortical screws and pseudoarthrosis at L5-S1. Due to her failure of conservative treatment imaging findings and progression of clinical syndrome I recommended reexploration of fusion we will hardware L4-S1 with revision and redo 5-1 posterior lateral fusion. I extensively went over the risks and benefits of the operation with the patient as well as perioperative course expectations of outcome and alternatives of surgery and she understands and agrees to proceed forward.  Operative procedure: Patient brought into the or was induced under general anesthesia positioned prone on the [facility] table her back was prepped and draped in routine sterile fashion the left side of her posterior superior iliac spine was exposed forefinger itself the midline incision was infiltrated and incised dissection was carried out to the posterior superior iliac spine and the posterior iliac crest was dissected free then using accommodation of chisels and gouges cancellus and cortical bone was harvested when an adequate amount of cortical cancellus bone and then harvested  from the iliac crest this was in good position irrigated meticulous in space was maintained the wounds closed in layers with after Vicryl and a running 4 subcuticular in the skin. Attention was then taken a midline incision which was opened up after infiltration of 10 mL lidocaine with epi the OR was identified and exposed the nuts were removed the rods were removed the screws at L4 were removed and the screws at S1 removed the screws at S1 were markedly loose. Then I dissected down posterior laterally exposing TPs L5 the lateral facet complexes at L5-S1 and the sacral alar. Then after I dissected free and exposed bilaterally posterior lateral space, I replaced the cortical screw on the left which had excellent purchase however the screw in the right did not so I placed a sacral alar screw 6050 by 35 mm this did have excellent purchase. I assembled the heads prior to assembling the heads I aggressively decorticated posterior lateral space from L5 down to the sacrum packed the cancellus iliac crest bone graft BMP and allostem morsels. Then placed the drain assembled the rods [place] in similar head tightened down the nuts and closed with interrupted Vicryl running 4 septic or and skin Dermabond benzo and Steri-Strips were applied and patient recovered in stable condition. At the end the case all needle counts sponge counts were correct.

## 2015-08-11 NOTE — Anesthesia Postprocedure Evaluation (Signed)
Anesthesia Post Note  Patient: Jamie [name]  Procedure(s) Performed: Procedure(s) (LRB): Lumbar Five- Sacral One Posterior Lateral Fusion Revision with iliac crest graft (N/A)  Patient location during evaluation: PACU Anesthesia Type: General Level of consciousness: awake, awake and alert, oriented and patient cooperative Pain management: pain level controlled Vital Signs Assessment: post-procedure vital signs reviewed and stable Respiratory status: spontaneous breathing and respiratory function stable Cardiovascular status: blood pressure returned to baseline Anesthetic complications: no    Last Vitals:  Filed Vitals:   08/11/15 1429 08/11/15 1905  BP: 110/60 109/64  Pulse: 97 97  Temp: 36.7 C 36.7 C  Resp: 18 14    Last Pain:  Filed Vitals:   08/11/15 1910  PainSc: 8                  Rhyland Hinderliter EDWARD

## 2015-08-11 NOTE — Anesthesia Procedure Notes (Signed)
Procedure Name: Intubation Date/Time: 08/11/2015 5:07 PM Performed by: Eligha Bridegroom Pre-anesthesia Checklist: Emergency Drugs available, Patient identified, Timeout performed and Suction available Patient Re-evaluated:Patient Re-evaluated prior to inductionOxygen Delivery Method: Circle system utilized Preoxygenation: Pre-oxygenation with 100% oxygen Intubation Type: IV induction Ventilation: Mask ventilation without difficulty Laryngoscope Size: Mac and 3 Grade View: Grade I Tube size: 7.0 mm Number of attempts: 1 Airway Equipment and Method: Stylet Tube secured with: Tape Dental Injury: Teeth and Oropharynx as per pre-operative assessment

## 2015-08-11 NOTE — Anesthesia Preprocedure Evaluation (Signed)
Anesthesia Evaluation  Patient identified by MRN, date of birth, ID band Patient awake    Reviewed: Allergy & Precautions, NPO status , Patient's Chart, lab work & pertinent test results  History of Anesthesia Complications (+) PONV  Airway Mallampati: II   Neck ROM: full    Dental   Pulmonary Current Smoker,    breath sounds clear to auscultation       Cardiovascular negative cardio ROS   Rhythm:regular Rate:Normal     Neuro/Psych Anxiety Depression    GI/Hepatic   Endo/Other    Renal/GU      Musculoskeletal  (+) Arthritis ,   Abdominal   Peds  Hematology   Anesthesia Other Findings   Reproductive/Obstetrics                             Anesthesia Physical Anesthesia Plan  ASA: II  Anesthesia Plan: General   Post-op Pain Management:    Induction: Intravenous  Airway Management Planned: Oral ETT  Additional Equipment:   Intra-op Plan:   Post-operative Plan: Extubation in OR  Informed Consent: I have reviewed the patients History and Physical, chart, labs and discussed the procedure including the risks, benefits and alternatives for the proposed anesthesia with the patient or authorized representative who has indicated his/her understanding and acceptance.     Plan Discussed with: CRNA, Anesthesiologist and Surgeon  Anesthesia Plan Comments:         Anesthesia Quick Evaluation

## 2015-08-11 NOTE — H&P (Signed)
Jamie [name] is an 44 y.o. female.   Chief Complaint: Back pain HPI: Patient is very pleasant 44 year old female has previously undergone L3-S1 fusion initially did very well however has had progressive worsening low back pain and workup has revealed loosening of her S1 screws and pseudoarthrosis at L5-S1. Due to her failure conservative treatment progression of clinical syndrome and imaging findings have recommended revision of S1 fusion utilizing iliac crest bone graft and replacement of S1 cortical screws with pedicle screws. I've extensively gone over the risks and benefits of the procedure the patient as well as perioperative course expectations of outcome and alternatives of surgery she understands and agrees to proceed forward.  Past Medical History  Diagnosis Date  . PONV (postoperative nausea and vomiting)   . Depression     takes Effexor daily  . History of bronchitis     > 64yr ago  . Seasonal allergies     uses Flonase daily  . Arthritis   . Joint pain   . Chronic back pain     herniated disc/stenosis/scoliosis  . Vitamin D deficiency     takes Vit D daily  . Eczema     uses a cream daily as needed  . Hypoglycemia   . Insomnia     takes Lorazepam nightly  . Family history of adverse reaction to anesthesia     pta dad is very hard to wake up;excessive nausea  . Anxiety     takes Ativan daily.  Panic Attack    Past Surgical History  Procedure Laterality Date  . Back surgery  2008/2012    laminectomy 1st time and 2nd time laminectomy and fusion  . Maximum access (mas)posterior lumbar interbody fusion (plif) 2 level N/A 09/01/2014    Procedure: Lumbar four-five, Lumbar five-Sacral one Maximum Access Surgery posterior lumbar interbody fusion with interbody prosthesis posterior lateral arthrodesis posterior segmental instrumentation;  Surgeon: Elaina Hoops, MD;  Location: [place] [facility];  Service: Neurosurgery;  Laterality: N/A;  Lumbar four-five, Lumbar five-Sacral one Maximum  Access Surgery posterior lumbar interbody fusion with i    Family History  Problem Relation Age of Onset  . Arthritis Mother   . Hyperlipidemia Mother   . Migraines Mother   . Arthritis Father   . Diabetes Father   . Heart disease Father   . Diabetes Brother    Social History:  reports that she has been smoking.  She has never used smokeless tobacco. She reports that she does not drink alcohol or use illicit drugs.  Allergies:  Allergies  Allergen Reactions  . Shellfish Allergy Hives  . Flexeril [Cyclobenzaprine] Hives    RAPID HEARTBEAT    Medications Prior to Admission  Medication Sig Dispense Refill  . ARIPiprazole (ABILIFY) 5 MG tablet Take 5 mg by mouth daily.    . cholecalciferol (VITAMIN D) 1000 UNITS tablet Take 1,000 Units by mouth daily.    . citalopram (CELEXA) 40 MG tablet Take 40 mg by mouth daily.   1  . clobetasol cream (TEMOVATE) [id number] % Apply 1 application topically 2 (two) times daily. (Patient taking differently: Apply 1 application topically 2 (two) times daily as needed. ) 30 g 2  . fluticasone (FLONASE) 50 MCG/ACT nasal spray Place 1 spray into both nostrils daily as needed for allergies.     Marland Kitchen gabapentin (NEURONTIN) 800 MG tablet Take 800 mg by mouth 3 (three) times daily.    Marland Kitchen levETIRAcetam (KEPPRA) 250 MG tablet Take 250 mg by mouth 2 (  two) times daily.    Marland Kitchen LORazepam (ATIVAN) 0.5 MG tablet Take 1 tablet (0.5 mg total) by mouth 2 (two) times daily as needed for anxiety. 60 tablet 1  . methocarbamol (ROBAXIN) 750 MG tablet Take 750 mg by mouth every 8 (eight) hours as needed.   1  . morphine (KADIAN) 60 MG 24 hr capsule Take 30 mg by mouth daily.     . Multiple Vitamins-Minerals (MULTIVITAMIN WITH MINERALS) tablet Take 1 tablet by mouth daily.    Marland Kitchen oxyCODONE (ROXICODONE) 15 MG immediate release tablet Take 15 mg by mouth every 4 (four) hours as needed for pain.    . Probiotic Product (PROBIOTIC DAILY PO) Take 1 tablet by mouth daily.    . traZODone  (DESYREL) 100 MG tablet Take 100-150 mg by mouth at bedtime.    Marland Kitchen venlafaxine XR (EFFEXOR-XR) 150 MG 24 hr capsule Take 150 mg by mouth daily with breakfast.       No results found for this or any previous visit (from the past 48 hour(s)). No results found.  Review of Systems  Constitutional: Negative.   HENT: Negative.   Eyes: Negative.   Respiratory: Negative.   Cardiovascular: Negative.   Gastrointestinal: Negative.   Genitourinary: Negative.   Musculoskeletal: Positive for myalgias, back pain and joint pain.  Skin: Negative.   Neurological: Negative.   Endo/Heme/Allergies: Negative.   Psychiatric/Behavioral: Negative.     Blood pressure 110/60, pulse 97, temperature 98 F (36.7 C), temperature source Oral, resp. rate 18, height 5\' 7"  (1.702 m), weight 78.019 kg (172 lb), last menstrual period 07/21/2015, SpO2 98 %. Physical Exam  Constitutional: She is oriented to person, place, and time. She appears well-developed.  HENT:  Head: Normocephalic.  Eyes: Pupils are equal, round, and reactive to light.  Neck: Normal range of motion.  Respiratory: Effort normal.  GI: Soft.  Neurological: She is alert and oriented to person, place, and time. She has normal strength. GCS eye subscore is 4. GCS verbal subscore is 5. GCS motor subscore is 6.  This 5 of 5 in her iliopsoas, quads, and she's, gastrocs, and tibialis, and EHL.  Skin: Skin is warm and dry.     Assessment/Plan 44 year old female presents for L5-S1 revision of fusion  Jamie [name] P 08/11/2015, 4:09 PM

## 2015-08-11 NOTE — Transfer of Care (Signed)
Immediate Anesthesia Transfer of Care Note  Patient: Jamie [name]  Procedure(s) Performed: Procedure(s): Lumbar Five- Sacral One Posterior Lateral Fusion Revision with iliac crest graft (N/A)  Patient Location: PACU  Anesthesia Type:General  Level of Consciousness: awake, alert  and oriented  Airway & Oxygen Therapy: Patient Spontanous Breathing and Patient connected to face mask oxygen  Post-op Assessment: Report given to RN and Post -op Vital signs reviewed and stable  Post vital signs: Reviewed and stable  Last Vitals:  Filed Vitals:   08/11/15 1429  BP: 110/60  Pulse: 97  Temp: 36.7 C  Resp: 18    Complications: No apparent anesthesia complications

## 2015-08-12 LAB — IGP, APTIMA HPV, RFX 16/18,45: PAP Smear Comment: 0

## 2015-08-12 NOTE — Evaluation (Signed)
Physical Therapy Evaluation Patient Details Name: Jamie [name] MRN: [id number] DOB: 04-03-1971 Today's Date: 08/12/2015   History of Present Illness  Adm for Pseudoarthrosis L5-S1 with loose S1 screws; underwent removal of hardware with L3-S1 fusion and bil sacral screws PMHx- 2 back surgeries '08, '12    Clinical Impression  Patient is s/p above surgery resulting in the deficits listed below (see PT Problem List). Despite previous back surgeries, pt can benefit from education re: safe use of DME. Patient will benefit from skilled PT to increase their independence and safety with mobility (while adhering to their precautions) to allow discharge to the venue listed below.     Follow Up Recommendations No PT follow up    Equipment Recommendations  Rolling walker with 5" wheels    Recommendations for Other Services OT consult     Precautions / Restrictions Precautions Precautions: Back Precaution Booklet Issued: Yes (comment) Required Braces or Orthoses: Spinal Brace Spinal Brace: Applied in sitting position (family to bring in)      Mobility  Bed Mobility Overal bed mobility: Needs Assistance Bed Mobility: Rolling;Sidelying to Sit Rolling: Supervision Sidelying to sit: Supervision       General bed mobility comments: + rail, vc for technique  Transfers Overall transfer level: Needs assistance Equipment used: Rolling walker (2 wheeled) Transfers: Sit to/from Stand Sit to Stand: Min guard         General transfer comment: for safety  Ambulation/Gait Ambulation/Gait assistance: Min guard Ambulation Distance (Feet): 12 Feet Assistive device: Rolling walker (2 wheeled) Gait Pattern/deviations: Step-through pattern;Decreased stride length   Gait velocity interpretation: Below normal speed for age/gender General Gait Details: slow, cautious  Stairs            Wheelchair Mobility    Modified Rankin (Stroke Patients Only)       Balance Overall balance  assessment: No apparent balance deficits (not formally assessed)                                           Pertinent Vitals/Pain Pain Assessment: 0-10 Pain Score: 8  Pain Location: Lt>Rt sacral region Pain Descriptors / Indicators: Operative site guarding Pain Intervention(s): Limited activity within patient's tolerance;Monitored during session;Premedicated before session;Repositioned;Relaxation    Home Living Family/patient expects to be discharged to:: Private residence Living Arrangements: Parent Available Help at Discharge: Family;Available 24 hours/day Type of Home: House Home Access: Stairs to enter Entrance Stairs-Rails: None (post) Entrance Stairs-Number of Steps: 2 Home Layout: Two level Home Equipment: Cane - single point Additional Comments: borrowed deceased grandmother's RW previously, not available    Prior Function Level of Independence: Independent               Hand Dominance        Extremity/Trunk Assessment   Upper Extremity Assessment: Overall WFL for tasks assessed           Lower Extremity Assessment: Overall WFL for tasks assessed      Cervical / Trunk Assessment: Normal  Communication   Communication: No difficulties  Cognition Arousal/Alertness: Awake/alert Behavior During Therapy: WFL for tasks assessed/performed Overall Cognitive Status: Within Functional Limits for tasks assessed                      General Comments      Exercises        Assessment/Plan    PT Assessment  Patient needs continued PT services  PT Diagnosis Difficulty walking;Acute pain   PT Problem List Decreased activity tolerance;Decreased mobility;Decreased knowledge of use of DME;Decreased knowledge of precautions;Pain  PT Treatment Interventions DME instruction;Gait training;Functional mobility training;Therapeutic activities;Patient/family education   PT Goals (Current goals can be found in the Care Plan section) Acute  Rehab PT Goals Patient Stated Goal: not overdo as probably did before PT Goal Formulation: With patient Time For Goal Achievement: 08/16/15 Potential to Achieve Goals: Good    Frequency Min 5X/week   Barriers to discharge        Co-evaluation               End of Session Equipment Utilized During Treatment:  (brace has not arrived, educated pt limited activity ) Activity Tolerance: Patient limited by pain Patient left: in chair;with call bell/phone within reach;with chair alarm set Nurse Communication: Mobility status;Other (comment) (no brace, limit time sitting to at most 1 hr)         Time: [id number] PT Time Calculation (min) (ACUTE ONLY): 28 min   Charges:   PT Evaluation $Initial PT Evaluation Tier I: 1 Procedure PT Treatments $Gait Training: 8-22 mins   PT G Codes:        Jamie [name] 09-03-2015, 10:11 AM Pager [phone]

## 2015-08-12 NOTE — Progress Notes (Signed)
Patient ID: Jamie [name], female   DOB: 1971-07-02, 44 y.o.   MRN: [id number] BP 102/53 mmHg  Pulse 74  Temp(Src) 98.2 F (36.8 C) (Oral)  Resp 20  Ht 5\' 7"  (1.702 m)  Wt 80.786 kg (178 lb 1.6 oz)  BMI 27.89 kg/m2  SpO2 99%  LMP 07/21/2015 Alert and oriented x 4 Walking well Awaiting placement

## 2015-08-12 NOTE — Evaluation (Signed)
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  This is the second back sx this calendar year.  Pt educated in back precautions related to ADL and IADL.  Pt does not wish to have a 3 in 1, has grab bars around her toilet at home.  Pt undecided as to whether she will use her parent's walk in shower with built in seat or tub in which she will stand.  Pt is aware of AE for LB ADL, but feels she will be able to cross her foot over opposite knee once her pain decreases. Limited session today due to lack of back brace here and 10/10 pain reported. Will follow.    Follow Up Recommendations  No OT follow up    Equipment Recommendations       Recommendations for Other Services       Precautions / Restrictions Precautions Precautions: Back;Fall Precaution Booklet Issued: Yes (comment) Precaution Comments: reinforced back precautions related to ADL and IADL Required Braces or Orthoses: Spinal Brace Spinal Brace: Applied in sitting position (family to bring in) Restrictions Weight Bearing Restrictions: No      Mobility Bed Mobility Overal bed mobility: Needs Assistance Bed Mobility: Sit to Sidelying Rolling: Supervision Sidelying to sit: Supervision     Sit to sidelying: Min assist (assisted LEs back into bed) General bed mobility comments: + rail, vc for technique  Transfers Overall transfer level: Needs assistance Equipment used: Rolling walker (2 wheeled) Transfers: Sit to/from Stand Sit to Stand: Min guard         General transfer comment: for safety    Balance Overall balance assessment: No apparent balance deficits  (not formally assessed)                                          ADL Overall ADL's : Needs assistance/impaired Eating/Feeding: Independent;Sitting   Grooming: Wash/dry hands;Standing;Min guard   Upper Body Bathing: Set up;Sitting   Lower Body Bathing: Moderate assistance;Sit to/from stand   Upper Body Dressing : Set up;Sitting   Lower Body Dressing: Moderate assistance;Sit to/from stand   Toilet Transfer: Min guard;RW   Toileting- Water quality scientist and Hygiene: Min guard;Sit to/from stand         General ADL Comments: Pt is aware of AE for LB bathing and dressing. Pt feels that she will be able to cross her foot over opposite knee as she did prior to admission when pain decreases.  Pt may plan to use her parent's walk in shower with built in seat initially.     Vision     Perception     Praxis      Pertinent Vitals/Pain Pain Assessment: 0-10 Pain Score: 10-Worst pain ever Pain Location: back Pain Descriptors / Indicators: Crying;Guarding;Grimacing;Moaning Pain Intervention(s): Limited activity within patient's tolerance;Monitored during session;Premedicated before session;Repositioned     Hand Dominance Right   Extremity/Trunk Assessment Upper Extremity Assessment Upper Extremity Assessment: Overall WFL for tasks assessed   Lower Extremity Assessment Lower Extremity Assessment: Defer to PT evaluation   Cervical / Trunk Assessment Cervical / Trunk Assessment: Normal  Communication Communication Communication: No difficulties   Cognition Arousal/Alertness: Awake/alert Behavior During Therapy: WFL for tasks assessed/performed Overall Cognitive Status: Within Functional Limits for tasks assessed                     General Comments       Exercises       Shoulder Instructions      Home Living Family/patient expects to be discharged to:: Private residence Living Arrangements: Parent Available Help at Discharge:  Family;Available 24 hours/day Type of Home: House Home Access: Stairs to enter [employer] of Steps: 2 Entrance Stairs-Rails: None Home Layout: Two level Alternate Level Stairs-Number of Steps: flight Alternate Level Stairs-Rails: Can reach both Bathroom Shower/Tub: Tub/shower unit;Walk-in shower (will likely use parent's walk in with built in seat )   Bathroom Toilet: Standard Bathroom Accessibility: Yes   Home Equipment: Cane - single point;Shower seat - built in;Grab bars - toilet   Additional Comments: borrowed deceased grandmother's RW previously, not available      Prior Functioning/Environment Level of Independence: Independent             OT Diagnosis: Generalized weakness;Acute pain   OT Problem List: Decreased strength;Decreased activity tolerance;Impaired balance (sitting and/or standing);Decreased knowledge of use of DME or AE;Pain   OT Treatment/Interventions: Self-care/ADL training;DME and/or AE instruction;Patient/family education    OT Goals(Current goals can be found in the care plan section) Acute Rehab OT Goals Patient Stated Goal: not overdo as probably did before OT Goal Formulation: With patient Time For Goal Achievement: 08/19/15 Potential to Achieve Goals: Good ADL Goals Pt Will Perform Grooming: with modified independence;standing Pt Will Transfer to Toilet: with modified independence;ambulating;regular height toilet;grab bars Pt Will Perform Toileting - Clothing Manipulation and hygiene: with modified independence;sit to/from stand Pt Will Perform Tub/Shower Transfer: Tub transfer;Shower transfer;ambulating;shower seat;rolling walker  OT Frequency: Min 2X/week   Barriers to D/C:            Co-evaluation              End of Session Equipment Utilized During Treatment: Gait belt  Activity Tolerance: Patient limited by pain Patient left: in bed;with call bell/phone within reach;with bed alarm set   Time: [id number] OT  Time Calculation (min): 16 min Charges:  OT General Charges $OT Visit: 1 Procedure OT Evaluation $Initial OT Evaluation Tier I: 1 Procedure G-Codes:    Malka So 08/12/2015, 10:54 AM [phone]

## 2015-08-12 NOTE — Progress Notes (Signed)
Patient ID: Jamie [name], female   DOB: 08/28/1970, 44 y.o.   MRN: [id number] C/o incisional pain, no weakness. Ambulated by PT. Still draining .

## 2015-08-12 NOTE — Care Management Note (Signed)
Case Management Note  Patient Details  Name: Jamie [name] MRN: [id number] Date of Birth: [birth year]  Subjective/Objective:    Patient admitted for L5-S1 PLIF. Patient is from home with family.               Action/Plan: Awaiting PT/OT recommendations. CM will continue to follow for discharge needs.   Expected Discharge Date:                  Expected Discharge Plan:     In-House Referral:     Discharge planning Services     Post Acute Care Choice:    Choice offered to:     DME Arranged:    DME Agency:     HH Arranged:    HH Agency:     Status of Service:  In process, will continue to follow  Medicare Important Message Given:    Date Medicare IM Given:    Medicare IM give by:    Date Additional Medicare IM Given:    Additional Medicare Important Message give by:     If discussed at [place] of Stay Meetings, dates discussed:    Additional Comments:  Pollie Friar, RN 08/12/2015, 11:10 AM

## 2015-08-13 NOTE — Progress Notes (Signed)
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  Follow Up Recommendations  No OT follow up    Equipment Recommendations  None recommended by OT       Precautions / Restrictions Precautions Precautions: Back;Fall Precaution Comments: reinforced back precautions related to ADL and IADL Required Braces or Orthoses: Spinal Brace Spinal Brace: Applied in sitting position (pt able to apply independently) Restrictions Weight Bearing Restrictions: No       Mobility Bed Mobility Overal bed mobility: Modified Independent Bed Mobility: Rolling;Sidelying to Sit Rolling: Modified independent (Device/Increase time) (with rail) Sidelying to sit: Modified independent (Device/Increase time)          Transfers Overall transfer level: Modified independent Equipment used: Rolling walker (2 wheeled) Transfers: Sit to/from Stand Sit to Stand: Modified independent (Device/Increase time)                  ADL Overall ADL's : Needs assistance/impaired     Grooming: Oral care;Modified independent;Standing Grooming Details (indicate cue type and reason): pt used two cups (one to spit and one to rinse) to avoid bending over sink                 Toilet Transfer: Modified Independent;Ambulation;RW;Regular Toilet;Grab bars. Made pt aware that it is easier to use wet wipes for back peri care to get cleaner quicker and with increased ease to avoid twisting.   Toileting- Clothing Manipulation and Hygiene: Modified independent;Sit to/from  Nurse, children's Details (indicate cue type and reason): Pt declines need to practice tub transfer                    Cognition   Behavior During Therapy: [facility] for tasks assessed/performed Overall Cognitive Status: Within Functional Limits for tasks assessed                                    Pertinent Vitals/ Pain       Pain Assessment: 0-10 Pain Score: 7  Pain Location: left SI area to half way down back of thigh Pain Descriptors / Indicators: Sharp;Sore Pain Intervention(s): Monitored during session;Repositioned (made RN aware)         Frequency Min 2X/week     Progress Toward Goals  OT Goals(current goals can now be found in the care plan section)  Progress towards OT goals: Goals met/education completed, patient discharged from [facility] Discharge plan remains appropriate       End of Session Equipment Utilized During Treatment: Rolling walker;Back brace   Activity Tolerance Patient tolerated treatment well   Patient Left in chair;with call bell/phone within reach;with chair alarm set   Nurse Communication  (pt would like to get washed up and dressed )        Time: 0349-1791 OT Time Calculation (min): 36 min  Charges: OT General Charges $OT Visit: 1 Procedure OT Treatments $Self Care/Home Management : 23-37 mins  Almon Register  [phone]  08/13/2015, 10:49 AM

## 2015-08-13 NOTE — Progress Notes (Signed)
Filed Vitals:   08/12/15 2217 08/13/15 0128 08/13/15 0545 08/13/15 1010  BP: 103/51 105/58 105/55 92/51  Pulse: 98 104 96 86  Temp: 98.4 F (36.9 C) 98.7 F (37.1 C) 98.6 F (37 C) 98.7 F (37.1 C)  TempSrc: Oral Oral Oral Oral  Resp: 20 20 20 20   Height:      Weight:      SpO2: 99% 96% 97% 100%    Patient resting in bed, significant amount of pain particularly with internal left side down to the buttock thigh and leg. Moderate drainage into Hemovac drain (90 mL with last drainage). Ambulating once in the halls yesterday and so far once today. Mobility remains somewhat limited, using a rolling walker. Continuing PT and OT.  Plan: Nursing staff to monitor Hemovac drainage, may be able to be DC'd tomorrow. Encouraged to ambulate at least another 2 or 3 more times today in the halls. Continuing PT and OT.  Hosie Spangle, MD 08/13/2015, 12:02 PM

## 2015-08-13 NOTE — Progress Notes (Signed)
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  Good recall and adherence to spine precautions, moves cautiously.  Supervision assist, except for stairs with MIN Guard.  Pain is greatest limitation, still with hemovac drain in place today.  Only able to perform 6 steps today, has full flight at home.  Patient will benefit from further PT services while in hospital, but is ok to transition home as indicated by medical status and pain.  Follow Up Recommendations  No PT follow up     Equipment Recommendations  Rolling walker with 5" wheels    Recommendations for Other Services       Precautions / Restrictions Precautions Precautions: Back;Fall Precaution Comments: reinforced back precautions Required Braces or Orthoses: Spinal Brace Spinal Brace: Applied in sitting position (pt able to apply independently) Restrictions Weight Bearing Restrictions: No    Mobility  Bed Mobility Overal bed mobility: Modified Independent Bed Mobility: Rolling;Sidelying to Sit Rolling: Modified independent (Device/Increase time) (with rail) Sidelying to sit: Modified independent (Device/Increase time)     Sit to sidelying: Min assist (assisted LEs back into bed) General bed mobility comments: + rail, vc for technique  Transfers Overall transfer level: Modified independent Equipment used: Rolling walker (2 wheeled) Transfers: Sit to/from Stand Sit to Stand: Modified independent (Device/Increase time)         General transfer comment: for safety  Ambulation/Gait Ambulation/Gait assistance: Supervision Ambulation Distance (Feet): 80 Feet Assistive  device: Rolling walker (2 wheeled) Gait Pattern/deviations: Step-through pattern   Gait velocity interpretation: Below normal speed for age/gender General Gait Details: slow, cautious   Stairs Stairs: Yes Stairs assistance: Min guard Stair Management: Two rails;Step to pattern Number of Stairs: 6 General stair comments: Needs full flight of stairs at home  Wheelchair Mobility    Modified Rankin (Stroke Patients Only)       Balance Overall balance assessment: No apparent balance deficits (not formally assessed)                                  Cognition Arousal/Alertness: Awake/alert Behavior During Therapy: WFL for tasks assessed/performed Overall Cognitive Status: Within Functional Limits for tasks assessed                      Exercises      General Comments        Pertinent Vitals/Pain Pain Assessment: 0-10 Pain Score: 8  Pain Location: Low back, and down back of Lt leg to mid thigh Pain Descriptors / Indicators: Constant;Sharp;Sore Pain Intervention(s): Premedicated before session;Limited activity within patient's tolerance;Monitored during session    Home Living                      Prior Function            PT Goals (current goals can now be found in the care plan section) Acute Rehab PT Goals Patient Stated Goal: Get home, stop pain PT Goal Formulation: With patient Time For Goal Achievement: 08/16/15 Potential to Achieve Goals: Good Progress towards PT goals: Progressing toward goals  Frequency  Min 5X/week    PT Plan Current plan remains appropriate    Co-evaluation             End of Session Equipment Utilized During Treatment: Gait belt;Back brace Activity Tolerance: Patient limited by pain;Patient tolerated treatment well;No increased pain Patient left: in chair;with call bell/phone within reach;with chair alarm set     Time: 1200-1230 PT Time Calculation (min) (ACUTE ONLY): 30 min  Charges:   $Gait Training: 8-22 mins $Therapeutic Activity: 8-22 mins                    G Codes:      Mang Hazelrigg L September 11, 2015, 1:01 PM

## 2015-08-14 MED ORDER — HYDROMORPHONE HCL 4 MG PO TABS: 4.0000 mg | ORAL_TABLET | ORAL | Status: DC | PRN

## 2015-08-14 NOTE — Discharge Summary (Signed)
Physician Discharge Summary  Patient ID: Jamie [name] MRN: [id number] DOB/AGE: 19-Oct-1970 44 y.o.  Admit date: 08/11/2015 Discharge date: 08/14/2015  Admission Diagnoses:  Discharge Diagnoses:  Active Problems:   Pseudoarthrosis of lumbar spine   Discharged Condition: good  Hospital Course: Surgery 3 days ago for pseudoarthrosis L5 S1. Did well. Slowly increrased activity. Wound clean and dry. Home pod 3, specific instruxctions given.  Consults: None  Significant Diagnostic Studies: none  Treatments: surgery: L5 S1 fusion  Discharge Exam: Blood pressure 101/50, pulse 83, temperature 98.3 F (36.8 C), temperature source Oral, resp. rate 18, height 5\' 7"  (1.702 m), weight 80.786 kg (178 lb 1.6 oz), last menstrual period 07/21/2015, SpO2 98 %. Incision/Wound:clean and dry; no new neuro issues  Disposition: 01-Home or Self Care     Medication List    ASK your doctor about these medications        ARIPiprazole 5 MG tablet  Commonly known as:  ABILIFY  Take 5 mg by mouth daily.     cholecalciferol 1000 UNITS tablet  Commonly known as:  VITAMIN D  Take 1,000 Units by mouth daily.     citalopram 40 MG tablet  Commonly known as:  CELEXA  Take 40 mg by mouth daily.     clobetasol cream 0.05 %  Commonly known as:  TEMOVATE  Apply 1 application topically 2 (two) times daily.     fluticasone 50 MCG/ACT nasal spray  Commonly known as:  FLONASE  Place 1 spray into both nostrils daily as needed for allergies.     gabapentin 800 MG tablet  Commonly known as:  NEURONTIN  Take 800 mg by mouth 3 (three) times daily.     levETIRAcetam 250 MG tablet  Commonly known as:  KEPPRA  Take 250 mg by mouth 2 (two) times daily.     LORazepam 0.5 MG tablet  Commonly known as:  ATIVAN  Take 1 tablet (0.5 mg total) by mouth 2 (two) times daily as needed for anxiety.     methocarbamol 750 MG tablet  Commonly known as:  ROBAXIN  Take 750 mg by mouth every 8 (eight) hours as  needed.     morphine 60 MG 24 hr capsule  Commonly known as:  KADIAN  Take 30 mg by mouth daily.     multivitamin with minerals tablet  Take 1 tablet by mouth daily.     oxyCODONE 15 MG immediate release tablet  Commonly known as:  ROXICODONE  Take 15 mg by mouth every 4 (four) hours as needed for pain.     PROBIOTIC DAILY PO  Take 1 tablet by mouth daily.     traZODone 100 MG tablet  Commonly known as:  DESYREL  Take 100-150 mg by mouth at bedtime.     venlafaxine XR 150 MG 24 hr capsule  Commonly known as:  EFFEXOR-XR  Take 150 mg by mouth daily with breakfast.         At home rest most of the time. Get up 9 or 10 times each day and take a 15 or 20 minute walk. No riding in the car and to your first postoperative appointment. If you have neck surgery you may shower from the chest down starting on the third postoperative day. If you had back surgery he may start showering on the third postoperative day with saran wrap wrapped around your incisional area 3 times. After the shower remove the saran wrap. Take pain medicine as needed and other medications as  instructed. Call my office for an appointment.  SignedFaythe Ghee, MD 08/14/2015, 10:52 AM

## 2015-08-14 NOTE — Progress Notes (Signed)
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 Verbalizes understanding of back precautions. Pt with no further mobility concerns at this time. Adequate for d/c from PT standpoint.  Follow Up Recommendations  No PT follow up     Equipment Recommendations  Rolling walker with 5" wheels    Recommendations for Other Services       Precautions / Restrictions Precautions Precautions: Back;Fall Precaution Comments: recalls 3/3 precautions Required Braces or Orthoses: Spinal Brace Spinal Brace: Applied in sitting position (pt able to apply independently) Restrictions Weight Bearing Restrictions: No    Mobility  Bed Mobility     Rolling:  (with rail)       Sit to sidelying:  (assisted LEs back into bed) General bed mobility comments: Standing when PT entered room  Transfers Overall transfer level: Modified independent Equipment used: None Transfers: Sit to/from Stand Sit to Stand: Modified independent (Device/Increase time)         General transfer comment: Standing when PT entered room. Good control with descent into chair.  Ambulation/Gait Ambulation/Gait assistance: Supervision Ambulation Distance (Feet): 150 Feet Assistive device: Rolling walker (2 wheeled);None Gait Pattern/deviations: Step-through pattern;Decreased stride length (knees flexed throughout gait cycle) Gait velocity: decreased Gait velocity interpretation: Below normal speed for age/gender General Gait Details: Guarded but steady without buckling or loss of balance. More confident with use of RW for support. maintains precautions. Supervision for safety.   Stairs Stairs:  Yes Stairs assistance: Supervision Stair Management: Two rails;Step to pattern;Forwards;Alternating pattern Number of Stairs: 12 General stair comments: Supervision for safety. VC for sequencing with step-to pattern for safety. no buckling or loss of balance. Uses rails appropriately. Feels confident with this task.  Wheelchair Mobility    Modified Rankin (Stroke Patients Only)       Balance                                    Cognition Arousal/Alertness: Awake/alert Behavior During Therapy: WFL for tasks assessed/performed Overall Cognitive Status: Within Functional Limits for tasks assessed                      Exercises      General Comments        Pertinent Vitals/Pain Pain Assessment: Faces Faces Pain Scale: Hurts a little bit Pain Location: back Pain Descriptors / Indicators: Constant;Tingling Pain Intervention(s): Monitored during session;Repositioned    Home Living                      Prior Function            PT Goals (current goals can now be found in the care plan section) Acute Rehab PT Goals Patient Stated Goal: Get home, stop pain PT Goal Formulation: With patient Time For Goal Achievement: 08/16/15 Potential to Achieve Goals: Good Progress towards PT goals: Progressing toward goals    Frequency  Min 5X/week    PT Plan Current plan remains appropriate    Co-evaluation             End of Session Equipment Utilized During Treatment: Back brace  Activity Tolerance: Patient tolerated treatment well;No increased pain Patient left: in chair;with call bell/phone within reach     Time: 1144-1156 PT Time Calculation (min) (ACUTE ONLY): 12 min  Charges:  $Gait Training: 8-22 mins                    G Codes:      Ellouise Newer Aug 29, 2015, 1:22 PM  Camille Bal [place], [place]

## 2015-08-14 NOTE — Progress Notes (Signed)
Discharge instructions reviewed with patient/family. RX given to patient. All questions answered at this time. Awaiting for transport from family.   Ave Filter, RN

## 2015-08-16 ENCOUNTER — Encounter: Payer: Self-pay | Admitting: Family Medicine

## 2015-08-30 ENCOUNTER — Encounter (HOSPITAL_COMMUNITY): Payer: Self-pay | Admitting: Neurosurgery

## 2015-09-12 ENCOUNTER — Other Ambulatory Visit: Payer: Self-pay | Admitting: Neurosurgery

## 2015-09-12 ENCOUNTER — Other Ambulatory Visit (HOSPITAL_COMMUNITY): Payer: Self-pay | Admitting: Neurosurgery

## 2015-09-12 DIAGNOSIS — M5137 Other intervertebral disc degeneration, lumbosacral region: Secondary | ICD-10-CM

## 2015-09-15 ENCOUNTER — Ambulatory Visit
Admission: RE | Admit: 2015-09-15 | Discharge: 2015-09-15 | Disposition: A | Discharge status: Home / Self Care | Payer: BLUE CROSS/BLUE SHIELD | Source: Ambulatory Visit

## 2015-09-15 ENCOUNTER — Ambulatory Visit
Admission: RE | Admit: 2015-09-15 | Discharge: 2015-09-15 | Disposition: A | Discharge status: Home / Self Care | Payer: BLUE CROSS/BLUE SHIELD | Source: Ambulatory Visit | Attending: Neurosurgery | Admitting: Neurosurgery

## 2015-09-15 DIAGNOSIS — Z981 Arthrodesis status: Secondary | ICD-10-CM | POA: Diagnosis not present

## 2015-09-15 DIAGNOSIS — M5137 Other intervertebral disc degeneration, lumbosacral region: Secondary | ICD-10-CM | POA: Diagnosis not present

## 2015-10-07 ENCOUNTER — Other Ambulatory Visit: Payer: Self-pay | Admitting: Family Medicine

## 2015-10-10 NOTE — Telephone Encounter (Signed)
Call in rx plz

## 2015-11-01 ENCOUNTER — Other Ambulatory Visit: Payer: Self-pay | Admitting: Neurosurgery

## 2015-11-01 DIAGNOSIS — M5137 Other intervertebral disc degeneration, lumbosacral region: Secondary | ICD-10-CM

## 2015-11-04 ENCOUNTER — Other Ambulatory Visit: Payer: Self-pay | Admitting: Family Medicine

## 2015-11-07 ENCOUNTER — Ambulatory Visit
Admission: RE | Admit: 2015-11-07 | Discharge: 2015-11-07 | Disposition: A | Discharge status: Home / Self Care | Payer: BLUE CROSS/BLUE SHIELD | Source: Ambulatory Visit | Attending: Neurosurgery | Admitting: Neurosurgery

## 2015-11-07 ENCOUNTER — Ambulatory Visit: Admission: RE | Admit: 2015-11-07 | Payer: BLUE CROSS/BLUE SHIELD | Source: Ambulatory Visit

## 2015-11-07 ENCOUNTER — Telehealth: Payer: Self-pay

## 2015-11-07 DIAGNOSIS — M5137 Other intervertebral disc degeneration, lumbosacral region: Secondary | ICD-10-CM | POA: Insufficient documentation

## 2015-11-07 DIAGNOSIS — Z9889 Other specified postprocedural states: Secondary | ICD-10-CM | POA: Insufficient documentation

## 2015-11-07 DIAGNOSIS — I700 Atherosclerosis of aorta: Secondary | ICD-10-CM | POA: Diagnosis not present

## 2015-11-07 NOTE — Telephone Encounter (Signed)
rx Lorazepam called in

## 2016-01-02 ENCOUNTER — Other Ambulatory Visit: Payer: Self-pay | Admitting: Family Medicine

## 2016-02-08 ENCOUNTER — Ambulatory Visit: Payer: Self-pay | Admitting: Family Medicine

## 2016-03-05 ENCOUNTER — Other Ambulatory Visit: Payer: Self-pay | Admitting: Family Medicine

## 2016-03-05 ENCOUNTER — Telehealth: Payer: Self-pay | Admitting: Family Medicine

## 2016-03-05 NOTE — Telephone Encounter (Signed)
Lorazepam printed and ready to fax to pharmacy

## 2016-03-05 NOTE — Telephone Encounter (Signed)
RX called into [address] [facility].  

## 2016-03-12 ENCOUNTER — Telehealth: Payer: Self-pay | Admitting: Family Medicine

## 2016-03-12 DIAGNOSIS — M545 Low back pain: Secondary | ICD-10-CM

## 2016-03-12 NOTE — Telephone Encounter (Signed)
Pt requests referral for back pain to Dr Wylene Men at [facility].

## 2016-03-12 NOTE — Telephone Encounter (Signed)
Referral placed.

## 2016-03-26 ENCOUNTER — Ambulatory Visit (INDEPENDENT_AMBULATORY_CARE_PROVIDER_SITE_OTHER): Payer: BLUE CROSS/BLUE SHIELD | Admitting: Family Medicine

## 2016-03-26 ENCOUNTER — Encounter: Payer: Self-pay | Admitting: Family Medicine

## 2016-03-26 DIAGNOSIS — F329 Major depressive disorder, single episode, unspecified: Secondary | ICD-10-CM | POA: Diagnosis not present

## 2016-03-26 DIAGNOSIS — F419 Anxiety disorder, unspecified: Secondary | ICD-10-CM

## 2016-03-26 DIAGNOSIS — F32A Depression, unspecified: Secondary | ICD-10-CM

## 2016-03-26 MED ORDER — LORAZEPAM 0.5 MG PO TABS: 0.5000 mg | ORAL_TABLET | Freq: Two times a day (BID) | ORAL | 5 refills | Status: DC

## 2016-03-26 NOTE — Assessment & Plan Note (Signed)
Stable on Ativan long-term without escalating dosing or changing doses will continue lorazepam 0.5  1 twice a day refills for 6 months.

## 2016-03-26 NOTE — Progress Notes (Signed)
BP 107/72 (BP Location: Left Arm, Patient Position: Sitting, Cuff Size: Normal)   Pulse 73   Temp 98.1 F (36.7 C)   Ht 5\' 8"  (1.727 m)   Wt 187 lb (84.8 kg)   LMP 03/12/2016 (Approximate)   SpO2 97%   BMI 28.43 kg/m    Subjective:    Patient ID: Jamie [name], female    DOB: 1971/04/09, 45 y.o.   MRN: [id number]  HPI: Jamie [name] is a 45 y.o. female  Chief Complaint  Patient presents with  . med check  Patient with a lot of issues going on. Biggest issue is continued pain after surgery is dealing with pain clinic and has appointment tomorrow. Patient's depression nerves maybe a little bit better has been able to stop Effexor without problems still having anxiety issues and taking lorazepam 0.51 twice a day without increasing dose and is been on this dose long-term without problems. Still has trazodone when necessary sleep which does okay when she takes that. Mainly needs lorazepam today.  Relevant past medical, surgical, family and social history reviewed and updated as indicated. Interim medical history since our last visit reviewed. Allergies and medications reviewed and updated.  Review of Systems  Constitutional: Negative.   Respiratory: Negative.   Cardiovascular: Negative.     Per HPI unless specifically indicated above     Objective:    BP 107/72 (BP Location: Left Arm, Patient Position: Sitting, Cuff Size: Normal)   Pulse 73   Temp 98.1 F (36.7 C)   Ht 5\' 8"  (1.727 m)   Wt 187 lb (84.8 kg)   LMP 03/12/2016 (Approximate)   SpO2 97%   BMI 28.43 kg/m   Wt Readings from Last 3 Encounters:  03/26/16 187 lb (84.8 kg)  08/11/15 178 lb 1.6 oz (80.8 kg)  08/09/15 172 lb (78 kg)    Physical Exam  Constitutional: She is oriented to person, place, and time. She appears well-developed and well-nourished. No distress.  HENT:  Head: Normocephalic and atraumatic.  Right Ear: Hearing normal.  Left Ear: Hearing normal.  Nose: Nose normal.  Eyes: Conjunctivae  and lids are normal. Right eye exhibits no discharge. Left eye exhibits no discharge. No scleral icterus.  Pulmonary/Chest: Effort normal. No respiratory distress.  Musculoskeletal: Normal range of motion.  Neurological: She is alert and oriented to person, place, and time.  Skin: Skin is intact. No rash noted.  Psychiatric: She has a normal mood and affect. Her speech is normal and behavior is normal. Judgment and thought content normal. Cognition and memory are normal.    Results for orders placed or performed in visit on 08/16/15  HM PAP SMEAR  Result Value Ref Range   HM Pap smear Negative with Neg HPV       Assessment & Plan:   Problem List Items Addressed This Visit      Other   Depression    Improved and is stopped Effexor has remained doing well with depression.      Relevant Medications   LORazepam (ATIVAN) 0.5 MG tablet   Anxiety    Stable on Ativan long-term without escalating dosing or changing doses will continue lorazepam 0.5  1 twice a day refills for 6 months.      Relevant Medications   LORazepam (ATIVAN) 0.5 MG tablet    Other Visit Diagnoses   None.      Follow up plan: Return in about 6 months (around 09/26/2016) for Physical Exam.

## 2016-03-26 NOTE — Assessment & Plan Note (Signed)
Improved and is stopped Effexor has remained doing well with depression.

## 2016-04-02 ENCOUNTER — Other Ambulatory Visit: Payer: Self-pay | Admitting: Neurosurgery

## 2016-04-02 DIAGNOSIS — S32009K Unspecified fracture of unspecified lumbar vertebra, subsequent encounter for fracture with nonunion: Secondary | ICD-10-CM

## 2016-04-06 ENCOUNTER — Ambulatory Visit
Admission: RE | Admit: 2016-04-06 | Discharge: 2016-04-06 | Disposition: A | Discharge status: Home / Self Care | Payer: BLUE CROSS/BLUE SHIELD | Source: Ambulatory Visit | Attending: Neurosurgery | Admitting: Neurosurgery

## 2016-04-06 ENCOUNTER — Ambulatory Visit: Admission: RE | Admit: 2016-04-06 | Payer: BLUE CROSS/BLUE SHIELD | Source: Ambulatory Visit

## 2016-04-06 DIAGNOSIS — S32009K Unspecified fracture of unspecified lumbar vertebra, subsequent encounter for fracture with nonunion: Secondary | ICD-10-CM

## 2016-04-06 DIAGNOSIS — Z981 Arthrodesis status: Secondary | ICD-10-CM | POA: Diagnosis not present

## 2016-04-06 DIAGNOSIS — X58XXXD Exposure to other specified factors, subsequent encounter: Secondary | ICD-10-CM | POA: Insufficient documentation

## 2016-04-06 DIAGNOSIS — I700 Atherosclerosis of aorta: Secondary | ICD-10-CM | POA: Insufficient documentation

## 2016-06-27 ENCOUNTER — Encounter: Payer: Self-pay | Admitting: Family Medicine

## 2016-06-27 ENCOUNTER — Other Ambulatory Visit: Payer: Self-pay | Admitting: Family Medicine

## 2016-06-27 ENCOUNTER — Ambulatory Visit (INDEPENDENT_AMBULATORY_CARE_PROVIDER_SITE_OTHER): Payer: BLUE CROSS/BLUE SHIELD | Admitting: Family Medicine

## 2016-06-27 VITALS — BP 100/66 | HR 86 | Temp 98.3°F | Wt 184.0 lb

## 2016-06-27 DIAGNOSIS — B9789 Other viral agents as the cause of diseases classified elsewhere: Secondary | ICD-10-CM | POA: Diagnosis not present

## 2016-06-27 DIAGNOSIS — J069 Acute upper respiratory infection, unspecified: Secondary | ICD-10-CM

## 2016-06-27 DIAGNOSIS — F17210 Nicotine dependence, cigarettes, uncomplicated: Secondary | ICD-10-CM

## 2016-06-27 MED ORDER — LIDOCAINE VISCOUS 2 % MT SOLN: 5.0000 mL | OROMUCOSAL | 0 refills | Status: DC | PRN

## 2016-06-27 MED ORDER — HYDROCOD POLST-CPM POLST ER 10-8 MG/5ML PO SUER: 5.0000 mL | Freq: Two times a day (BID) | ORAL | 0 refills | Status: DC | PRN

## 2016-06-27 MED ORDER — BENZONATATE 100 MG PO CAPS: 200.0000 mg | ORAL_CAPSULE | Freq: Three times a day (TID) | ORAL | 0 refills | Status: DC | PRN

## 2016-06-27 MED ORDER — ALBUTEROL SULFATE HFA 108 (90 BASE) MCG/ACT IN AERS: 2.0000 | INHALATION_SPRAY | Freq: Four times a day (QID) | RESPIRATORY_TRACT | 0 refills | Status: DC | PRN

## 2016-06-27 MED ORDER — AZITHROMYCIN 250 MG PO TABS: ORAL_TABLET | ORAL | 0 refills | Status: DC

## 2016-06-27 NOTE — Patient Instructions (Signed)
Follow up as needed

## 2016-06-27 NOTE — Progress Notes (Signed)
BP 100/66 (BP Location: Right Arm, Patient Position: Sitting, Cuff Size: Normal)   Pulse 86   Temp 98.3 F (36.8 C)   Wt 184 lb (83.5 kg)   LMP 06/20/2016 (Approximate)   SpO2 99%   BMI 27.98 kg/m    Subjective:    Patient ID: Jamie [name], female    DOB: 12-Sep-1970, 45 y.o.   MRN: [id number]  HPI: Jamie [name] is a 45 y.o. female  Chief Complaint  Patient presents with  . URI    pt states she has congestion, runny nose, cough, and teeth pain. states symptoms started about 2 days ago and states she feels awful   Patient presents with several days of congestion, rhinorrhea, productive cough, and sinus pain going down to her teeth. Had a fever and malaise yesterday, and today is wheezy and SOB. States she has had several back surgeries and all of the coughing has been incredibly painful. Has been trying mucinex, essential oils, and many OTC cough syrups with no relief. No sick contacts. Current every day smoker, trying to cut back.   Past Medical History:  Diagnosis Date  . Anxiety    takes Ativan daily.  Panic Attack  . Arthritis   . Chronic back pain    herniated disc/stenosis/scoliosis  . Depression    takes Effexor daily  . Eczema    uses a cream daily as needed  . Family history of adverse reaction to anesthesia    pta dad is very hard to wake up;excessive nausea  . History of bronchitis    > 40yr ago  . Hypoglycemia   . Insomnia    takes Lorazepam nightly  . Joint pain   . PONV (postoperative nausea and vomiting)   . Seasonal allergies    uses Flonase daily  . Vitamin D deficiency    takes Vit D daily   Social History   Social History  . Marital status: Legally Separated    Spouse name: N/A  . Number of children: N/A  . Years of education: N/A   Occupational History  . Not on file.   Social History Main Topics  . Smoking status: Current Every Day Smoker    Packs/day: 0.50    Years: 20.00  . Smokeless tobacco: Never Used  . Alcohol use No  .  Drug use: No  . Sexual activity: Not Currently   Other Topics Concern  . Not on file   Social History Narrative  . No narrative on file    Relevant past medical, surgical, family and social history reviewed and updated as indicated. Interim medical history since our last visit reviewed. Allergies and medications reviewed and updated.  Review of Systems  Constitutional: Positive for fever.  HENT: Positive for congestion, ear pain, postnasal drip, rhinorrhea, sinus pain, sinus pressure and sore throat.   Eyes: Negative.   Respiratory: Positive for cough and wheezing.   Cardiovascular: Negative.   Gastrointestinal: Negative.   Genitourinary: Negative.   Musculoskeletal: Positive for back pain.  Skin: Negative.   Neurological: Negative.   Psychiatric/Behavioral: Negative.     Per HPI unless specifically indicated above     Objective:    BP 100/66 (BP Location: Right Arm, Patient Position: Sitting, Cuff Size: Normal)   Pulse 86   Temp 98.3 F (36.8 C)   Wt 184 lb (83.5 kg)   LMP 06/20/2016 (Approximate)   SpO2 99%   BMI 27.98 kg/m   Wt Readings from Last 3 Encounters:  06/27/16  184 lb (83.5 kg)  03/26/16 187 lb (84.8 kg)  08/11/15 178 lb 1.6 oz (80.8 kg)    Physical Exam  Constitutional: She is oriented to person, place, and time. She appears well-developed and well-nourished. No distress.  HENT:  Head: Atraumatic.  Right Ear: External ear normal.  Left Ear: External ear normal.  Mouth/Throat: No oropharyngeal exudate.  Oropharynx moderately erythematous B/l nasal mucosa injected   Eyes: Conjunctivae are normal. No scleral icterus.  Neck: Normal range of motion. Neck supple.  Cardiovascular: Normal rate, regular rhythm and normal heart sounds.   Pulmonary/Chest: Effort normal. No respiratory distress.  Musculoskeletal: Normal range of motion.  Neurological: She is alert and oriented to person, place, and time.  Skin: Skin is warm and dry.  Psychiatric: She  has a normal mood and affect. Her behavior is normal.  Nursing note and vitals reviewed.     Assessment & Plan:   Problem List Items Addressed This Visit    None    Visit Diagnoses    Viral URI with cough    -  Primary   Tessalon, tussionex, and albuterol inhaler sent. Continue flonase, mucinex, and sinus rinses. Z-pak sent if worsening over weekend.   Relevant Medications   azithromycin (ZITHROMAX) 250 MG tablet   Cigarette nicotine dependence without complication       Discussed importance of smoking cessation to overall health but specifically to lessening severity of these URI's. Pt working on cutting back.     Pt not interested in any medication assistance for smoking cessation at this time. Will work on tapering off number of cigarettes per day.    Follow up plan: Return if symptoms worsen or fail to improve.

## 2016-07-17 ENCOUNTER — Other Ambulatory Visit: Payer: Self-pay | Admitting: Neurosurgery

## 2016-07-17 DIAGNOSIS — S32009K Unspecified fracture of unspecified lumbar vertebra, subsequent encounter for fracture with nonunion: Secondary | ICD-10-CM

## 2016-08-02 ENCOUNTER — Ambulatory Visit
Admission: RE | Admit: 2016-08-02 | Discharge: 2016-08-02 | Disposition: A | Discharge status: Home / Self Care | Payer: BLUE CROSS/BLUE SHIELD | Source: Ambulatory Visit | Attending: Neurosurgery | Admitting: Neurosurgery

## 2016-08-02 DIAGNOSIS — S32009K Unspecified fracture of unspecified lumbar vertebra, subsequent encounter for fracture with nonunion: Secondary | ICD-10-CM

## 2016-09-17 ENCOUNTER — Other Ambulatory Visit: Payer: Self-pay | Admitting: Family Medicine

## 2016-09-17 MED ORDER — LORAZEPAM 0.5 MG PO TABS: 0.5000 mg | ORAL_TABLET | Freq: Two times a day (BID) | ORAL | 0 refills | Status: DC

## 2016-09-17 NOTE — Telephone Encounter (Signed)
Patient has an appt with Dr Jeananne Rama in Feb but needs a refill on her lorazepam sent to her [place] now.  Thanks Santiago Glad

## 2016-09-17 NOTE — Telephone Encounter (Signed)
Last (acute) OV: 06/27/16 Last routine OV: 03/26/16 Next OV: 10/16/16

## 2016-09-27 ENCOUNTER — Other Ambulatory Visit: Payer: Self-pay | Admitting: Family Medicine

## 2016-10-16 ENCOUNTER — Ambulatory Visit (INDEPENDENT_AMBULATORY_CARE_PROVIDER_SITE_OTHER): Payer: BLUE CROSS/BLUE SHIELD | Admitting: Family Medicine

## 2016-10-16 ENCOUNTER — Encounter: Payer: Self-pay | Admitting: Family Medicine

## 2016-10-16 VITALS — BP 119/82 | HR 82 | Ht 67.32 in | Wt 178.0 lb

## 2016-10-16 DIAGNOSIS — M5441 Lumbago with sciatica, right side: Secondary | ICD-10-CM

## 2016-10-16 DIAGNOSIS — M5442 Lumbago with sciatica, left side: Secondary | ICD-10-CM | POA: Diagnosis not present

## 2016-10-16 DIAGNOSIS — Z114 Encounter for screening for human immunodeficiency virus [HIV]: Secondary | ICD-10-CM

## 2016-10-16 DIAGNOSIS — F419 Anxiety disorder, unspecified: Secondary | ICD-10-CM

## 2016-10-16 DIAGNOSIS — Z13220 Encounter for screening for lipoid disorders: Secondary | ICD-10-CM | POA: Diagnosis not present

## 2016-10-16 DIAGNOSIS — F3341 Major depressive disorder, recurrent, in partial remission: Secondary | ICD-10-CM | POA: Diagnosis not present

## 2016-10-16 DIAGNOSIS — Z23 Encounter for immunization: Secondary | ICD-10-CM | POA: Diagnosis not present

## 2016-10-16 DIAGNOSIS — G8929 Other chronic pain: Secondary | ICD-10-CM | POA: Insufficient documentation

## 2016-10-16 DIAGNOSIS — L309 Dermatitis, unspecified: Secondary | ICD-10-CM

## 2016-10-16 DIAGNOSIS — M549 Dorsalgia, unspecified: Secondary | ICD-10-CM

## 2016-10-16 DIAGNOSIS — Z0000 Encounter for general adult medical examination without abnormal findings: Secondary | ICD-10-CM | POA: Diagnosis not present

## 2016-10-16 DIAGNOSIS — Z1329 Encounter for screening for other suspected endocrine disorder: Secondary | ICD-10-CM | POA: Diagnosis not present

## 2016-10-16 LAB — URINALYSIS, ROUTINE W REFLEX MICROSCOPIC
Bilirubin, UA: NEGATIVE
Glucose, UA: NEGATIVE
KETONES UA: NEGATIVE
LEUKOCYTES UA: NEGATIVE
NITRITE UA: NEGATIVE
PH UA: 5 (ref 5.0–7.5)
Protein, UA: NEGATIVE
RBC UA: NEGATIVE
UUROB: 0.2 mg/dL (ref 0.2–1.0)

## 2016-10-16 MED ORDER — LORAZEPAM 0.5 MG PO TABS: 0.5000 mg | ORAL_TABLET | Freq: Two times a day (BID) | ORAL | 1 refills | Status: DC

## 2016-10-16 NOTE — Assessment & Plan Note (Signed)
The current medical regimen is effective;  continue present plan and medications.  

## 2016-10-16 NOTE — Progress Notes (Signed)
BP 119/82   Pulse 82   Ht 5' 7.32" (1.71 m)   Wt 178 lb (80.7 kg)   SpO2 98%   BMI 27.61 kg/m    Subjective:    Patient ID: Jamie [name], female    DOB: Mar 25, 1971, 46 y.o.   MRN: [id number]  HPI: Jamie [name] is a 46 y.o. female  Chief Complaint  Patient presents with  . Annual Exam  Patient all in all doing well nerve medicines have been discontinued had problems with weight gain and is been doing very good with weight loss. Patient's nerves been okay at best. Patient's chronic back pain is still problematic with chronic pain has tried the final cord stimulator with no real relief of pain. Going through considerations for removal of hardware. Working with pain clinic. Anxiety panic treated with lorazepam takes 0.5 mg most every night sometimes takes one during the day. Her gabapentin are prescribed for her legs and stable at best. Takes Temovate for hand eczema  Relevant past medical, surgical, family and social history reviewed and updated as indicated. Interim medical history since our last visit reviewed. Allergies and medications reviewed and updated.  Review of Systems  Constitutional: Negative.   HENT: Negative.   Eyes: Negative.   Respiratory: Negative.   Cardiovascular: Negative.   Gastrointestinal: Negative.   Endocrine: Negative.   Genitourinary: Negative.   Musculoskeletal: Negative.   Skin: Negative.   Allergic/Immunologic: Negative.   Neurological: Negative.   Hematological: Negative.   Psychiatric/Behavioral: Negative.     Per HPI unless specifically indicated above     Objective:    BP 119/82   Pulse 82   Ht 5' 7.32" (1.71 m)   Wt 178 lb (80.7 kg)   SpO2 98%   BMI 27.61 kg/m   Wt Readings from Last 3 Encounters:  10/16/16 178 lb (80.7 kg)  06/27/16 184 lb (83.5 kg)  03/26/16 187 lb (84.8 kg)    Physical Exam  Constitutional: She is oriented to person, place, and time. She appears well-developed and well-nourished.  HENT:  Head:  Normocephalic and atraumatic.  Right Ear: External ear normal.  Left Ear: External ear normal.  Nose: Nose normal.  Mouth/Throat: Oropharynx is clear and moist.  Eyes: Conjunctivae and EOM are normal. Pupils are equal, round, and reactive to light.  Neck: Normal range of motion. Neck supple. Carotid bruit is not present.  Cardiovascular: Normal rate, regular rhythm and normal heart sounds.   No murmur heard. Pulmonary/Chest: Effort normal and breath sounds normal. She exhibits no mass. Right breast exhibits no mass, no skin change and no tenderness. Left breast exhibits no mass, no skin change and no tenderness. Breasts are symmetrical.  Abdominal: Soft. Bowel sounds are normal. There is no hepatosplenomegaly.  Musculoskeletal: Normal range of motion.  Neurological: She is alert and oriented to person, place, and time.  Skin: No rash noted.  Psychiatric: She has a normal mood and affect. Her behavior is normal. Judgment and thought content normal.    Results for orders placed or performed in visit on 08/16/15  HM PAP SMEAR  Result Value Ref Range   HM Pap smear Negative with Neg HPV       Assessment & Plan:   Problem List Items Addressed This Visit      Musculoskeletal and Integument   Eczema    The current medical regimen is effective;  continue present plan and medications.         Other   Depression  The current medical regimen is effective;  continue present plan and medications.       Relevant Medications   LORazepam (ATIVAN) 0.5 MG tablet   Anxiety    The current medical regimen is effective;  continue present plan and medications.       Relevant Medications   LORazepam (ATIVAN) 0.5 MG tablet   Chronic back pain    Working with pain clinic and multiple options are still pending.       Other Visit Diagnoses    Annual physical exam    -  Primary   Relevant Orders   CBC with Differential/Platelet   Comprehensive metabolic panel   Lipid panel   TSH    Urinalysis, Routine w reflex microscopic   Screening cholesterol level       Relevant Orders   Lipid panel   Thyroid disorder screen       Relevant Orders   TSH   Need for influenza vaccination       Relevant Orders   Flu Vaccine QUAD 36+ mos PF IM (Fluarix & Fluzone Quad PF) (Completed)   Encounter for screening for HIV       Relevant Orders   HIV antibody       Follow up plan: Return for Follow-up anxiety lorazepam.

## 2016-10-16 NOTE — Assessment & Plan Note (Signed)
Working with pain clinic and multiple options are still pending.

## 2016-10-17 ENCOUNTER — Encounter: Payer: Self-pay | Admitting: Family Medicine

## 2016-10-17 LAB — CBC WITH DIFFERENTIAL/PLATELET
BASOS: 0 %
Basophils Absolute: 0 10*3/uL (ref 0.0–0.2)
EOS (ABSOLUTE): 0 10*3/uL (ref 0.0–0.4)
Eos: 0 %
Hematocrit: 40.5 % (ref 34.0–46.6)
Hemoglobin: 13.7 g/dL (ref 11.1–15.9)
Immature Grans (Abs): 0 10*3/uL (ref 0.0–0.1)
Immature Granulocytes: 0 %
Lymphocytes Absolute: 2.4 10*3/uL (ref 0.7–3.1)
Lymphs: 42 %
MCH: 32.1 pg (ref 26.6–33.0)
MCHC: 33.8 g/dL (ref 31.5–35.7)
MCV: 95 fL (ref 79–97)
MONOS ABS: 0.4 10*3/uL (ref 0.1–0.9)
Monocytes: 7 %
Neutrophils Absolute: 2.9 10*3/uL (ref 1.4–7.0)
Neutrophils: 51 %
PLATELETS: 225 10*3/uL (ref 150–379)
RBC: 4.27 x10E6/uL (ref 3.77–5.28)
RDW: 14 % (ref 12.3–15.4)
WBC: 5.6 10*3/uL (ref 3.4–10.8)

## 2016-10-17 LAB — COMPREHENSIVE METABOLIC PANEL
A/G RATIO: 1.8 (ref 1.2–2.2)
ALT: 17 IU/L (ref 0–32)
AST: 16 IU/L (ref 0–40)
Albumin: 4.4 g/dL (ref 3.5–5.5)
Alkaline Phosphatase: 55 IU/L (ref 39–117)
BILIRUBIN TOTAL: 0.3 mg/dL (ref 0.0–1.2)
BUN/Creatinine Ratio: 16 (ref 9–23)
BUN: 11 mg/dL (ref 6–24)
CHLORIDE: 102 mmol/L (ref 96–106)
CO2: 25 mmol/L (ref 18–29)
Calcium: 9.6 mg/dL (ref 8.7–10.2)
Creatinine, Ser: 0.67 mg/dL (ref 0.57–1.00)
GFR calc non Af Amer: 105 mL/min/{1.73_m2} (ref >59)
GFR, EST AFRICAN AMERICAN: 121 mL/min/{1.73_m2} (ref >59)
GLOBULIN, TOTAL: 2.5 g/dL (ref 1.5–4.5)
Glucose: 109 mg/dL — ABNORMAL HIGH (ref 65–99)
POTASSIUM: 4.8 mmol/L (ref 3.5–5.2)
SODIUM: 140 mmol/L (ref 134–144)
TOTAL PROTEIN: 6.9 g/dL (ref 6.0–8.5)

## 2016-10-17 LAB — LIPID PANEL
CHOL/HDL RATIO: 3.6 ratio (ref 0.0–4.4)
Cholesterol, Total: 180 mg/dL (ref 100–199)
HDL: 50 mg/dL (ref >39)
LDL Calculated: 114 mg/dL — ABNORMAL HIGH (ref 0–99)
Triglycerides: 78 mg/dL (ref 0–149)
VLDL Cholesterol Cal: 16 mg/dL (ref 5–40)

## 2016-10-17 LAB — HIV ANTIBODY (ROUTINE TESTING W REFLEX): HIV SCREEN 4TH GENERATION: NONREACTIVE

## 2016-10-17 LAB — TSH: TSH: 0.943 u[IU]/mL (ref 0.450–4.500)

## 2016-12-21 ENCOUNTER — Other Ambulatory Visit: Payer: Self-pay | Admitting: Family Medicine

## 2016-12-21 NOTE — Telephone Encounter (Signed)
  Last routine OV: 10/16/16  Next OV: 8/23//18

## 2016-12-24 ENCOUNTER — Other Ambulatory Visit: Payer: Self-pay | Admitting: Family Medicine

## 2016-12-24 MED ORDER — LORAZEPAM 0.5 MG PO TABS: ORAL_TABLET | ORAL | 0 refills | Status: DC

## 2017-01-07 ENCOUNTER — Ambulatory Visit (INDEPENDENT_AMBULATORY_CARE_PROVIDER_SITE_OTHER): Payer: BLUE CROSS/BLUE SHIELD | Admitting: Family Medicine

## 2017-01-07 VITALS — BP 101/65 | HR 70 | Ht 68.0 in | Wt 176.0 lb

## 2017-01-07 DIAGNOSIS — F419 Anxiety disorder, unspecified: Secondary | ICD-10-CM | POA: Diagnosis not present

## 2017-01-07 DIAGNOSIS — L03113 Cellulitis of right upper limb: Secondary | ICD-10-CM | POA: Diagnosis not present

## 2017-01-07 MED ORDER — MUPIROCIN 2 % EX OINT: 1.0000 "application " | TOPICAL_OINTMENT | Freq: Two times a day (BID) | CUTANEOUS | 0 refills | Status: DC

## 2017-01-07 MED ORDER — LORAZEPAM 0.5 MG PO TABS: ORAL_TABLET | ORAL | 0 refills | Status: DC

## 2017-01-07 NOTE — Assessment & Plan Note (Signed)
We will continue lorazepam

## 2017-01-07 NOTE — Progress Notes (Signed)
BP 101/65   Pulse 70   Ht 5\' 8"  (1.727 m)   Wt 176 lb (79.8 kg)   SpO2 99%   BMI 26.76 kg/m    Subjective:    Patient ID: Jamie [name], female    DOB: 23-Oct-1970, 45 y.o.   MRN: [id number]  HPI: Jamie [name] is a 46 y.o. female  Chief Complaint  Patient presents with  . Insect Bite    Ichy, Painful Noticed on Friday.    No known trauma irritation no red streaks going up arm is black in the center about 4 mm with redness in the skin around it. No fever or chills or other systemic effects. No other people with similar lesions no known tick bites.  Patient's anxiety also controlled taken lorazepam half a milligram will need another refill sooner than later will go ahead with refill today  Relevant past medical, surgical, family and social history reviewed and updated as indicated. Interim medical history since our last visit reviewed. Allergies and medications reviewed and updated.  Review of Systems  Constitutional: Negative.   Respiratory: Negative.   Cardiovascular: Negative.     Per HPI unless specifically indicated above     Objective:    BP 101/65   Pulse 70   Ht 5\' 8"  (1.727 m)   Wt 176 lb (79.8 kg)   SpO2 99%   BMI 26.76 kg/m   Wt Readings from Last 3 Encounters:  01/07/17 176 lb (79.8 kg)  10/16/16 178 lb (80.7 kg)  06/27/16 184 lb (83.5 kg)    Physical Exam  Constitutional: She is oriented to person, place, and time. She appears well-developed and well-nourished.  HENT:  Head: Normocephalic and atraumatic.  Eyes: Conjunctivae and EOM are normal.  Neck: Normal range of motion.  Cardiovascular: Normal rate, regular rhythm and normal heart sounds.   Pulmonary/Chest: Effort normal and breath sounds normal.  Musculoskeletal: Normal range of motion.  Neurological: She is alert and oriented to person, place, and time.  Skin: No erythema.  Skin lesion right superior elbow area as noted above  Psychiatric: She has a normal mood and affect. Her behavior  is normal. Judgment and thought content normal.    Results for orders placed or performed in visit on 10/16/16  CBC with Differential/Platelet  Result Value Ref Range   WBC 5.6 3.4 - 10.8 x10E3/uL   RBC 4.27 3.77 - 5.28 x10E6/uL   Hemoglobin 13.7 11.1 - 15.9 g/dL   Hematocrit 40.5 34.0 - 46.6 %   MCV 95 79 - 97 fL   MCH 32.1 26.6 - 33.0 pg   MCHC 33.8 31.5 - 35.7 g/dL   RDW 14.0 12.3 - 15.4 %   Platelets 225 150 - 379 x10E3/uL   Neutrophils 51 Not Estab. %   Lymphs 42 Not Estab. %   Monocytes 7 Not Estab. %   Eos 0 Not Estab. %   Basos 0 Not Estab. %   Neutrophils Absolute 2.9 1.4 - 7.0 x10E3/uL   Lymphocytes Absolute 2.4 0.7 - 3.1 x10E3/uL   Monocytes Absolute 0.4 0.1 - 0.9 x10E3/uL   EOS (ABSOLUTE) 0.0 0.0 - 0.4 x10E3/uL   Basophils Absolute 0.0 0.0 - 0.2 x10E3/uL   Immature Granulocytes 0 Not Estab. %   Immature Grans (Abs) 0.0 0.0 - 0.1 x10E3/uL  Comprehensive metabolic panel  Result Value Ref Range   Glucose 109 (H) 65 - 99 mg/dL   BUN 11 6 - 24 mg/dL   Creatinine, Ser 0.67 0.57 -  1.00 mg/dL   GFR calc non Af Amer 105 >59 mL/min/1.73   GFR calc Af Amer 121 >59 mL/min/1.73   BUN/Creatinine Ratio 16 9 - 23   Sodium 140 134 - 144 mmol/L   Potassium 4.8 3.5 - 5.2 mmol/L   Chloride 102 96 - 106 mmol/L   CO2 25 18 - 29 mmol/L   Calcium 9.6 8.7 - 10.2 mg/dL   Total Protein 6.9 6.0 - 8.5 g/dL   Albumin 4.4 3.5 - 5.5 g/dL   Globulin, Total 2.5 1.5 - 4.5 g/dL   Albumin/Globulin Ratio 1.8 1.2 - 2.2   Bilirubin Total 0.3 0.0 - 1.2 mg/dL   Alkaline Phosphatase 55 39 - 117 IU/L   AST 16 0 - 40 IU/L   ALT 17 0 - 32 IU/L  Lipid panel  Result Value Ref Range   Cholesterol, Total 180 100 - 199 mg/dL   Triglycerides 78 0 - 149 mg/dL   HDL 50 >39 mg/dL   VLDL Cholesterol Cal 16 5 - 40 mg/dL   LDL Calculated 114 (H) 0 - 99 mg/dL   Chol/HDL Ratio 3.6 0.0 - 4.4 ratio units  TSH  Result Value Ref Range   TSH 0.943 0.450 - 4.500 uIU/mL  Urinalysis, Routine w reflex microscopic    Result Value Ref Range   Specific Gravity, UA >1.030 (H) 1.005 - 1.030   pH, UA 5.0 5.0 - 7.5   Color, UA Yellow Yellow   Appearance Ur Cloudy (A) Clear   Leukocytes, UA Negative Negative   Protein, UA Negative Negative/Trace   Glucose, UA Negative Negative   Ketones, UA Negative Negative   RBC, UA Negative Negative   Bilirubin, UA Negative Negative   Urobilinogen, Ur 0.2 0.2 - 1.0 mg/dL   Nitrite, UA Negative Negative  HIV antibody  Result Value Ref Range   HIV Screen 4th Generation wRfx Non Reactive Non Reactive      Assessment & Plan:   Problem List Items Addressed This Visit      Other   Anxiety    We will continue lorazepam      Relevant Medications   LORazepam (ATIVAN) 0.5 MG tablet    Other Visit Diagnoses    Cellulitis of right upper extremity    -  Primary       Discuss cellulitis care and treatment use of Bactroban and protection if getting worse will notify us Follow up plan: Return for As scheduled.

## 2017-02-21 ENCOUNTER — Other Ambulatory Visit: Payer: Self-pay | Admitting: Family Medicine

## 2017-02-21 NOTE — Telephone Encounter (Signed)
Patient needs her lorazepam script sent in to [facility] [phone]

## 2017-02-22 ENCOUNTER — Other Ambulatory Visit: Payer: Self-pay | Admitting: Family Medicine

## 2017-02-22 NOTE — Telephone Encounter (Signed)
Routing to provider  

## 2017-02-25 MED ORDER — LORAZEPAM 0.5 MG PO TABS: ORAL_TABLET | ORAL | 0 refills | Status: DC

## 2017-02-25 NOTE — Telephone Encounter (Signed)
Last OV: 01/07/17 Next OV: 01/09/17   No results found for: HGBA1C

## 2017-02-25 NOTE — Telephone Encounter (Signed)
Call pt 

## 2017-02-25 NOTE — Telephone Encounter (Signed)
Patient was transferred to provider for telephone conversation.   

## 2017-02-28 ENCOUNTER — Other Ambulatory Visit: Payer: Self-pay | Admitting: Unknown Physician Specialty

## 2017-02-28 DIAGNOSIS — M25561 Pain in right knee: Secondary | ICD-10-CM

## 2017-03-12 ENCOUNTER — Ambulatory Visit: Payer: BLUE CROSS/BLUE SHIELD

## 2017-04-11 ENCOUNTER — Ambulatory Visit: Payer: BLUE CROSS/BLUE SHIELD | Admitting: Family Medicine

## 2017-04-18 ENCOUNTER — Other Ambulatory Visit: Payer: Self-pay | Admitting: Family Medicine

## 2017-04-18 MED ORDER — LORAZEPAM 0.5 MG PO TABS: ORAL_TABLET | ORAL | 0 refills | Status: DC

## 2017-04-18 NOTE — Telephone Encounter (Signed)
Pt called and would like a refill for her lorazepam to [facility].

## 2017-04-18 NOTE — Telephone Encounter (Signed)
Rx printed and ready to fax

## 2017-04-19 NOTE — Telephone Encounter (Signed)
Called and left patient a VM letting her know that her prescription has been faxed to the pharmacy for her.

## 2017-05-24 ENCOUNTER — Other Ambulatory Visit: Payer: Self-pay | Admitting: Family Medicine

## 2017-05-24 NOTE — Telephone Encounter (Signed)
Patient requesting a refill on medication lorazepam sent to [facility].  Patient will call to schedule follow-up apt in November due to not knowing her schedule and getting a new job in [employer].   Please Advise.  Thank you

## 2017-05-27 MED ORDER — LORAZEPAM 0.5 MG PO TABS: ORAL_TABLET | ORAL | 0 refills | Status: DC

## 2017-05-27 NOTE — Telephone Encounter (Signed)
Please see message from front desk staff 

## 2017-05-28 NOTE — Telephone Encounter (Signed)
Faxed script for Ativan to [facility]

## 2017-05-29 DIAGNOSIS — H5213 Myopia, bilateral: Secondary | ICD-10-CM | POA: Diagnosis not present

## 2017-07-10 ENCOUNTER — Telehealth: Payer: Self-pay | Admitting: Family Medicine

## 2017-07-10 NOTE — Telephone Encounter (Signed)
Copied from [place]. Topic: Quick Communication - See Telephone Encounter >> Jul 10, 2017  4:36 PM Corie Chiquito, [place] wrote: CRM for notification. See Telephone encounter for: Patient needs a refill on her Lorazepam. Patient does have an appointment to be seen on August 21, 2017  07/10/17.

## 2017-07-10 NOTE — Telephone Encounter (Signed)
Rx  Request  For Lorazepam refill

## 2017-07-15 MED ORDER — LORAZEPAM 0.5 MG PO TABS: ORAL_TABLET | ORAL | 1 refills | Status: DC

## 2017-07-18 DIAGNOSIS — Z6827 Body mass index (BMI) 27.0-27.9, adult: Secondary | ICD-10-CM | POA: Diagnosis not present

## 2017-07-18 DIAGNOSIS — M5442 Lumbago with sciatica, left side: Secondary | ICD-10-CM | POA: Diagnosis not present

## 2017-07-18 DIAGNOSIS — M5136 Other intervertebral disc degeneration, lumbar region: Secondary | ICD-10-CM | POA: Diagnosis not present

## 2017-07-18 DIAGNOSIS — R030 Elevated blood-pressure reading, without diagnosis of hypertension: Secondary | ICD-10-CM | POA: Diagnosis not present

## 2017-07-18 DIAGNOSIS — I10 Essential (primary) hypertension: Secondary | ICD-10-CM | POA: Diagnosis not present

## 2017-07-18 DIAGNOSIS — M961 Postlaminectomy syndrome, not elsewhere classified: Secondary | ICD-10-CM | POA: Diagnosis not present

## 2017-07-29 ENCOUNTER — Telehealth: Payer: BLUE CROSS/BLUE SHIELD | Admitting: Family

## 2017-07-29 DIAGNOSIS — J028 Acute pharyngitis due to other specified organisms: Secondary | ICD-10-CM

## 2017-07-29 DIAGNOSIS — B9689 Other specified bacterial agents as the cause of diseases classified elsewhere: Secondary | ICD-10-CM

## 2017-07-29 MED ORDER — AZITHROMYCIN 250 MG PO TABS: ORAL_TABLET | ORAL | 0 refills | Status: DC

## 2017-07-29 MED ORDER — BENZONATATE 100 MG PO CAPS: 100.0000 mg | ORAL_CAPSULE | Freq: Three times a day (TID) | ORAL | 0 refills | Status: DC | PRN

## 2017-07-29 MED ORDER — PREDNISONE 5 MG PO TABS: 5.0000 mg | ORAL_TABLET | ORAL | 0 refills | Status: DC

## 2017-07-29 NOTE — Progress Notes (Signed)
Thank you for the details you included in the comment boxes. Those details are very helpful in determining the best course of treatment for you and help [place] to provide the best care. The only cough syrup we offer in this program is recommending over-the-counter. The reason is actually [facility] regulations and E-Visit policies. That being said, we can absolutely treat your conditions with medications that will help you.  We are sorry that you are not feeling well.  Here is how we plan to help!  Based on your presentation I believe you most likely have A cough due to bacteria.  When patients have a fever and a productive cough with a change in color or increased sputum production, we are concerned about bacterial bronchitis.  If left untreated it can progress to pneumonia.  If your symptoms do not improve with your treatment plan it is important that you contact your provider.   I have prescribed Azithromyin 250 mg: two tablets now and then one tablet daily for 4 additonal days    In addition you may use A non-prescription cough medication called Mucinex DM: take 2 tablets every 12 hours. and A prescription cough medication called Tessalon Perles 100mg . You may take 1-2 capsules every 8 hours as needed for your cough.  Sterapred 5 mg dosepak  From your responses in the eVisit questionnaire you describe inflammation in the upper respiratory tract which is causing a significant cough.  This is commonly called Bronchitis and has four common causes:    Allergies  Viral Infections  Acid Reflux  Bacterial Infection Allergies, viruses and acid reflux are treated by controlling symptoms or eliminating the cause. An example might be a cough caused by taking certain blood pressure medications. You stop the cough by changing the medication. Another example might be a cough caused by acid reflux. Controlling the reflux helps control the cough.  USE OF BRONCHODILATOR ("RESCUE") INHALERS: There is a risk from using  your bronchodilator too frequently.  The risk is that over-reliance on a medication which only relaxes the muscles surrounding the breathing tubes can reduce the effectiveness of medications prescribed to reduce swelling and congestion of the tubes themselves.  Although you feel brief relief from the bronchodilator inhaler, your asthma may actually be worsening with the tubes becoming more swollen and filled with mucus.  This can delay other crucial treatments, such as oral steroid medications. If you need to use a bronchodilator inhaler daily, several times per day, you should discuss this with your provider.  There are probably better treatments that could be used to keep your asthma under control.     HOME CARE . Only take medications as instructed by your medical team. . Complete the entire course of an antibiotic. . Drink plenty of fluids and get plenty of rest. . Avoid close contacts especially the very young and the elderly . Cover your mouth if you cough or cough into your sleeve. . Always remember to wash your hands . A steam or ultrasonic humidifier can help congestion.   GET HELP RIGHT AWAY IF: . You develop worsening fever. . You become short of breath . You cough up blood. . Your symptoms persist after you have completed your treatment plan MAKE SURE YOU   Understand these instructions.  Will watch your condition.  Will get help right away if you are not doing well or get worse.  Your e-visit answers were reviewed by a board certified advanced clinical practitioner to complete your personal care  plan.  Depending on the condition, your plan could have included both over the counter or prescription medications. If there is a problem please reply  once you have received a response from your provider. Your safety is important to [place].  If you have drug allergies check your prescription carefully.    You can use MyChart to ask questions about today's visit, request a non-urgent call  back, or ask for a work or school excuse for 24 hours related to this e-Visit. If it has been greater than 24 hours you will need to follow up with your provider, or enter a new e-Visit to address those concerns. You will get an e-mail in the next two days asking about your experience.  I hope that your e-visit has been valuable and will speed your recovery. Thank you for using e-visits.

## 2017-08-21 ENCOUNTER — Ambulatory Visit (INDEPENDENT_AMBULATORY_CARE_PROVIDER_SITE_OTHER): Payer: 59 | Admitting: Family Medicine

## 2017-08-21 ENCOUNTER — Ambulatory Visit: Payer: BLUE CROSS/BLUE SHIELD | Admitting: Family Medicine

## 2017-08-21 VITALS — Wt 169.0 lb

## 2017-08-21 DIAGNOSIS — G4700 Insomnia, unspecified: Secondary | ICD-10-CM

## 2017-08-21 DIAGNOSIS — F419 Anxiety disorder, unspecified: Secondary | ICD-10-CM | POA: Diagnosis not present

## 2017-08-21 DIAGNOSIS — F3341 Major depressive disorder, recurrent, in partial remission: Secondary | ICD-10-CM

## 2017-08-21 MED ORDER — QUETIAPINE FUMARATE 50 MG PO TABS: 50.0000 mg | ORAL_TABLET | Freq: Every day | ORAL | 0 refills | Status: DC

## 2017-08-21 MED ORDER — CLONAZEPAM 0.5 MG PO TABS: 0.5000 mg | ORAL_TABLET | Freq: Two times a day (BID) | ORAL | 0 refills | Status: DC | PRN

## 2017-08-21 NOTE — Progress Notes (Signed)
Wt 169 lb (76.7 kg)   BMI 25.70 kg/m    Subjective:    Patient ID: Jamie [name], female    DOB: 09-28-1970, 47 y.o.   MRN: [id number]  HPI: Jamie [name] is a 47 y.o. female  Chief Complaint  Patient presents with  . Follow-up  . Anxiety   Patient presents today for anxiety follow up. Has a lot of significant life stress right now and is having frequent panic episodes, crying spells, and overall dysphoric moods. Sleeping very poorly. Used to see a Social worker, stopped a while back. Used to be followed by Dr. Nicolasa Ducking but didn't feel that was very helpful. Tried trazodone with no relief in the past. Has been taking ativan for quite some time which she states now barely takes the edge off of her anxiety. Denies SI/HI, hallucinations.   GAD 7 : Generalized Anxiety Score 08/21/2017  Nervous, Anxious, on Edge 3  Control/stop worrying 3  Worry too much - different things 3  Trouble relaxing 3  Restless 3  Easily annoyed or irritable 3  Afraid - awful might happen 2  Total GAD 7 Score 20  Anxiety Difficulty Somewhat difficult   Depression screen PHQ 2/9 10/16/2016  Decreased Interest 2  Down, Depressed, Hopeless 2  PHQ - 2 Score 4  Altered sleeping 3  Tired, decreased energy 2  Change in appetite 2  Feeling bad or failure about yourself  2  Trouble concentrating 2  Moving slowly or fidgety/restless 1  Suicidal thoughts 0  PHQ-9 Score 16   Past Medical History:  Diagnosis Date  . Anxiety    takes Ativan daily.  Panic Attack  . Arthritis   . Chronic back pain    herniated disc/stenosis/scoliosis  . Depression    takes Effexor daily  . Eczema    uses a cream daily as needed  . Family history of adverse reaction to anesthesia    pta dad is very hard to wake up;excessive nausea  . History of bronchitis    > 34yr ago  . Hypoglycemia   . Insomnia    takes Lorazepam nightly  . Joint pain   . PONV (postoperative nausea and vomiting)   . Seasonal allergies    uses Flonase  daily  . Vitamin D deficiency    takes Vit D daily   Social History   Socioeconomic History  . Marital status: Legally Separated    Spouse name: Not on file  . Number of children: Not on file  . Years of education: Not on file  . Highest education level: Not on file  Social Needs  . Financial resource strain: Not on file  . Food insecurity - worry: Not on file  . Food insecurity - inability: Not on file  . Transportation needs - medical: Not on file  . Transportation needs - non-medical: Not on file  Occupational History  . Not on file  Tobacco Use  . Smoking status: Current Every Day Smoker    Packs/day: 0.50    Years: 20.00    Pack years: 10.00  . Smokeless tobacco: Never Used  Substance and Sexual Activity  . Alcohol use: No  . Drug use: No  . Sexual activity: Not Currently  Other Topics Concern  . Not on file  Social History Narrative  . Not on file   Relevant past medical, surgical, family and social history reviewed and updated as indicated. Interim medical history since our last visit reviewed. Allergies and medications reviewed  and updated.  Review of Systems  Constitutional: Positive for fatigue.  Eyes: Negative.   Respiratory: Negative.   Cardiovascular: Negative.   Musculoskeletal: Negative.   Skin: Negative.   Neurological: Negative.   Psychiatric/Behavioral: Positive for dysphoric mood and sleep disturbance. The patient is nervous/anxious.     Per HPI unless specifically indicated above     Objective:    Wt 169 lb (76.7 kg)   BMI 25.70 kg/m   Wt Readings from Last 3 Encounters:  08/21/17 169 lb (76.7 kg)  01/07/17 176 lb (79.8 kg)  10/16/16 178 lb (80.7 kg)    Physical Exam  Constitutional: She is oriented to person, place, and time. She appears well-developed and well-nourished. No distress.  HENT:  Head: Atraumatic.  Eyes: Conjunctivae are normal. Pupils are equal, round, and reactive to light.  Neck: Normal range of motion. Neck  supple.  Cardiovascular: Normal rate and normal heart sounds.  Pulmonary/Chest: Effort normal and breath sounds normal.  Musculoskeletal: Normal range of motion.  Neurological: She is alert and oriented to person, place, and time.  Skin: Skin is warm and dry.  Psychiatric: Judgment and thought content normal.  tearful  Nursing note and vitals reviewed.     Assessment & Plan:   Problem List Items Addressed This Visit      Other   Depression    Will try seroquel for sleep as well as her depression sxs. Start at 25 mg (half tab) and increase as tolerated. Will recheck in 1 month. Risks and benefits reviewed. New list of counselors given, pt agreeable to reaching out and establishing with someone      Anxiety - Primary    Ativan not helpful at this point, will switch to klonopin 1-2 times daily prn for severe anxiety. Pt to start counseling sessions as well for further benefit. F/u in 1 month      Insomnia    Will try seroquel for sleep as well as her depression sxs. Start at 25 mg (half tab) and increase as tolerated. Will recheck in 1 month. Risks and benefits reviewed          Follow up plan: Return in about 4 weeks (around 09/18/2017) for Anxiety.

## 2017-08-24 ENCOUNTER — Encounter: Payer: Self-pay | Admitting: Family Medicine

## 2017-08-24 DIAGNOSIS — G4700 Insomnia, unspecified: Secondary | ICD-10-CM | POA: Insufficient documentation

## 2017-08-24 NOTE — Patient Instructions (Signed)
Follow up in 1 month   

## 2017-08-24 NOTE — Assessment & Plan Note (Signed)
Will try seroquel for sleep as well as her depression sxs. Start at 25 mg (half tab) and increase as tolerated. Will recheck in 1 month. Risks and benefits reviewed. New list of counselors given, pt agreeable to reaching out and establishing with someone

## 2017-08-24 NOTE — Assessment & Plan Note (Signed)
Will try seroquel for sleep as well as her depression sxs. Start at 25 mg (half tab) and increase as tolerated. Will recheck in 1 month. Risks and benefits reviewed

## 2017-08-24 NOTE — Assessment & Plan Note (Signed)
Ativan not helpful at this point, will switch to klonopin 1-2 times daily prn for severe anxiety. Pt to start counseling sessions as well for further benefit. F/u in 1 month

## 2017-09-12 ENCOUNTER — Other Ambulatory Visit: Payer: Self-pay | Admitting: Unknown Physician Specialty

## 2017-09-12 DIAGNOSIS — M25561 Pain in right knee: Secondary | ICD-10-CM

## 2017-09-16 ENCOUNTER — Encounter: Payer: Self-pay | Admitting: Family Medicine

## 2017-09-17 ENCOUNTER — Other Ambulatory Visit: Payer: Self-pay | Admitting: Family Medicine

## 2017-09-17 MED ORDER — CLONAZEPAM 0.5 MG PO TABS: 0.5000 mg | ORAL_TABLET | Freq: Two times a day (BID) | ORAL | 0 refills | Status: DC | PRN

## 2017-09-18 ENCOUNTER — Ambulatory Visit
Admission: RE | Admit: 2017-09-18 | Discharge: 2017-09-18 | Disposition: A | Discharge status: Home / Self Care | Payer: 59 | Source: Ambulatory Visit | Attending: Unknown Physician Specialty | Admitting: Unknown Physician Specialty

## 2017-09-18 DIAGNOSIS — M25561 Pain in right knee: Secondary | ICD-10-CM | POA: Diagnosis not present

## 2017-10-15 ENCOUNTER — Encounter: Payer: Self-pay | Admitting: Family Medicine

## 2017-10-15 ENCOUNTER — Ambulatory Visit (INDEPENDENT_AMBULATORY_CARE_PROVIDER_SITE_OTHER): Payer: 59 | Admitting: Family Medicine

## 2017-10-15 VITALS — BP 89/61 | HR 99 | Temp 98.8°F | Wt 169.5 lb

## 2017-10-15 DIAGNOSIS — Z6841 Body Mass Index (BMI) 40.0 and over, adult: Secondary | ICD-10-CM | POA: Diagnosis not present

## 2017-10-15 DIAGNOSIS — F3341 Major depressive disorder, recurrent, in partial remission: Secondary | ICD-10-CM | POA: Diagnosis not present

## 2017-10-15 DIAGNOSIS — F419 Anxiety disorder, unspecified: Secondary | ICD-10-CM | POA: Diagnosis not present

## 2017-10-15 DIAGNOSIS — G4700 Insomnia, unspecified: Secondary | ICD-10-CM | POA: Diagnosis not present

## 2017-10-15 DIAGNOSIS — Z6826 Body mass index (BMI) 26.0-26.9, adult: Secondary | ICD-10-CM | POA: Diagnosis not present

## 2017-10-15 DIAGNOSIS — L309 Dermatitis, unspecified: Secondary | ICD-10-CM | POA: Diagnosis not present

## 2017-10-15 DIAGNOSIS — M5136 Other intervertebral disc degeneration, lumbar region: Secondary | ICD-10-CM | POA: Diagnosis not present

## 2017-10-15 DIAGNOSIS — M961 Postlaminectomy syndrome, not elsewhere classified: Secondary | ICD-10-CM | POA: Diagnosis not present

## 2017-10-15 DIAGNOSIS — R030 Elevated blood-pressure reading, without diagnosis of hypertension: Secondary | ICD-10-CM | POA: Diagnosis not present

## 2017-10-15 MED ORDER — CRISABOROLE 2 % EX OINT: 1.0000 "application " | TOPICAL_OINTMENT | Freq: Two times a day (BID) | CUTANEOUS | 3 refills | Status: DC | PRN

## 2017-10-15 MED ORDER — SERTRALINE HCL 50 MG PO TABS: 50.0000 mg | ORAL_TABLET | Freq: Every day | ORAL | 1 refills | Status: DC

## 2017-10-15 MED ORDER — CLONAZEPAM 0.5 MG PO TABS: 0.5000 mg | ORAL_TABLET | Freq: Two times a day (BID) | ORAL | 0 refills | Status: DC | PRN

## 2017-10-15 MED ORDER — QUETIAPINE FUMARATE 50 MG PO TABS: 50.0000 mg | ORAL_TABLET | Freq: Every day | ORAL | 0 refills | Status: DC

## 2017-10-15 NOTE — Assessment & Plan Note (Signed)
Pt reports great sleep with 1/4-1/2 tab of seroquel. Wants to continue current regimen, declined smaller dose tabs and prefers to keep cutting them into sections depending upon need. Continue current regimen

## 2017-10-15 NOTE — Patient Instructions (Signed)
Follow up for CPE 

## 2017-10-15 NOTE — Progress Notes (Signed)
BP (!) 89/61 (BP Location: Left Arm, Patient Position: Sitting, Cuff Size: Normal)   Pulse 99   Temp 98.8 F (37.1 C) (Tympanic)   Wt 169 lb 8 oz (76.9 kg)   SpO2 99%   BMI 25.77 kg/m    Subjective:    Patient ID: Jamie [name], female    DOB: Jan 02, 1971, 47 y.o.   MRN: [id number]  HPI: Skie Vitrano is a 47 y.o. female  Chief Complaint  Patient presents with  . Anxiety    Patient states no complaints  . Medication Management   Pt here for anxiety and insomnia f/u. Was started on seroquel at bedtime at previous visit to help with sleep and moods. Taking 1/4 - 1/2 tab nightly depending on how early she needs to get up in the morning. Sometimes feels groggy if taking 1/2 tab if she isn't able to get a full 8 hours of sleep. Cannot take full 50 mg tab. Taking 2 klonopin daily, which does help with her anxiety though she is still having frequent significant sxs with panic episodes. Has a lot of social stress currently that is exacerbating things for her. Denies SI/HI, hallucinations, severe mood swings. Failed effexor and several SSRIs in the past  Also notes her hand dermatitis from eczema is severe currently, with flaking, cracking, and peeling of both palms. 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 Has tried and failed several steroid creams in the past. Moisturizes BID and sleeps with mittens on the help moisturizer penetration in hands.   Relevant past medical, surgical, family and social history reviewed and updated as indicated. Interim medical history since our last visit reviewed. Allergies and medications reviewed and updated.  Review of Systems  Per HPI unless specifically indicated above     Objective:    BP (!) 89/61 (BP Location: Left Arm, Patient Position: Sitting, Cuff Size: Normal)   Pulse 99   Temp 98.8 F (37.1 C) (Tympanic)   Wt 169 lb 8 oz (76.9 kg)   SpO2 99%   BMI 25.77 kg/m   Wt Readings from Last 3 Encounters:    10/15/17 169 lb 8 oz (76.9 kg)  08/21/17 169 lb (76.7 kg)  01/07/17 176 lb (79.8 kg)    Physical Exam  Constitutional: She is oriented to person, place, and time. She appears well-developed and well-nourished. No distress.  HENT:  Head: Atraumatic.  Eyes: Conjunctivae are normal. Pupils are equal, round, and reactive to light.  Neck: Normal range of motion. Neck supple.  Cardiovascular: Normal rate and normal heart sounds.  Pulmonary/Chest: Effort normal and breath sounds normal. No respiratory distress.  Musculoskeletal: Normal range of motion.  Neurological: She is alert and oriented to person, place, and time.  Skin: Skin is warm and dry.  Psychiatric: Judgment and thought content normal.  tearful  Nursing note and vitals reviewed.  Results for orders placed or performed in visit on 10/16/16  CBC with Differential/Platelet  Result Value Ref Range   WBC 5.6 3.4 - 10.8 x10E3/uL   RBC 4.27 3.77 - 5.28 x10E6/uL   Hemoglobin 13.7 11.1 - 15.9 g/dL   Hematocrit 40.5 34.0 - 46.6 %   MCV 95 79 - 97 fL   MCH 32.1 26.6 - 33.0 pg   MCHC 33.8 31.5 - 35.7 g/dL   RDW 14.0 12.3 - 15.4 %   Platelets 225 150 - 379 x10E3/uL   Neutrophils 51 Not Estab. %   Lymphs 42 Not Estab. %  Monocytes 7 Not Estab. %   Eos 0 Not Estab. %   Basos 0 Not Estab. %   Neutrophils Absolute 2.9 1.4 - 7.0 x10E3/uL   Lymphocytes Absolute 2.4 0.7 - 3.1 x10E3/uL   Monocytes Absolute 0.4 0.1 - 0.9 x10E3/uL   EOS (ABSOLUTE) 0.0 0.0 - 0.4 x10E3/uL   Basophils Absolute 0.0 0.0 - 0.2 x10E3/uL   Immature Granulocytes 0 Not Estab. %   Immature Grans (Abs) 0.0 0.0 - 0.1 x10E3/uL  Comprehensive metabolic panel  Result Value Ref Range   Glucose 109 (H) 65 - 99 mg/dL   BUN 11 6 - 24 mg/dL   Creatinine, Ser 0.67 0.57 - 1.00 mg/dL   GFR calc non Af Amer 105 >59 mL/min/1.73   GFR calc Af Amer 121 >59 mL/min/1.73   BUN/Creatinine Ratio 16 9 - 23   Sodium 140 134 - 144 mmol/L   Potassium 4.8 3.5 - 5.2 mmol/L    Chloride 102 96 - 106 mmol/L   CO2 25 18 - 29 mmol/L   Calcium 9.6 8.7 - 10.2 mg/dL   Total Protein 6.9 6.0 - 8.5 g/dL   Albumin 4.4 3.5 - 5.5 g/dL   Globulin, Total 2.5 1.5 - 4.5 g/dL   Albumin/Globulin Ratio 1.8 1.2 - 2.2   Bilirubin Total 0.3 0.0 - 1.2 mg/dL   Alkaline Phosphatase 55 39 - 117 IU/L   AST 16 0 - 40 IU/L   ALT 17 0 - 32 IU/L  Lipid panel  Result Value Ref Range   Cholesterol, Total 180 100 - 199 mg/dL   Triglycerides 78 0 - 149 mg/dL   HDL 50 >39 mg/dL   VLDL Cholesterol Cal 16 5 - 40 mg/dL   LDL Calculated 114 (H) 0 - 99 mg/dL   Chol/HDL Ratio 3.6 0.0 - 4.4 ratio units  TSH  Result Value Ref Range   TSH 0.943 0.450 - 4.500 uIU/mL  Urinalysis, Routine w reflex microscopic  Result Value Ref Range   Specific Gravity, UA >1.030 (H) 1.005 - 1.030   pH, UA 5.0 5.0 - 7.5   Color, UA Yellow Yellow   Appearance Ur Cloudy (A) Clear   Leukocytes, UA Negative Negative   Protein, UA Negative Negative/Trace   Glucose, UA Negative Negative   Ketones, UA Negative Negative   RBC, UA Negative Negative   Bilirubin, UA Negative Negative   Urobilinogen, Ur 0.2 0.2 - 1.0 mg/dL   Nitrite, UA Negative Negative  HIV antibody  Result Value Ref Range   HIV Screen 4th Generation wRfx Non Reactive Non Reactive      Assessment & Plan:   Problem List Items Addressed This Visit      Musculoskeletal and Integument   Eczema    Will trial eucrisa as she's failed steroid creams in the past. Continue good moisturizer regimen, will also try goat soap        Other   Depression    Will add zoloft and monitor closely for mood benefit. Pt is also starting counseling soon with a new counselor and hoping that will help.       Relevant Medications   sertraline (ZOLOFT) 50 MG tablet   Anxiety - Primary    Worsened lately by social stressors. Doing fairly well on the klonopin BID prn but still having breakthrough sxs and panic episodes. Will try adding zoloft to help achieve a more  stable baseline. Risks and benefits reviewed. Pt states she's always tolerated SSRIs fine, just hasn't found them  beneficial in the past. Will monitor closely for benefit.       Relevant Medications   sertraline (ZOLOFT) 50 MG tablet   Insomnia    Pt reports great sleep with 1/4-1/2 tab of seroquel. Wants to continue current regimen, declined smaller dose tabs and prefers to keep cutting them into sections depending upon need. Continue current regimen          Follow up plan: Return in about 6 weeks (around 11/26/2017) for CPE, anxiety f/u.

## 2017-10-15 NOTE — Assessment & Plan Note (Signed)
Will trial eucrisa as she's failed steroid creams in the past. Continue good moisturizer regimen, will also try goat soap

## 2017-10-15 NOTE — Assessment & Plan Note (Signed)
Will add zoloft and monitor closely for mood benefit. Pt is also starting counseling soon with a new counselor and hoping that will help.

## 2017-10-15 NOTE — Assessment & Plan Note (Signed)
Worsened lately by social stressors. Doing fairly well on the klonopin BID prn but still having breakthrough sxs and panic episodes. Will try adding zoloft to help achieve a more stable baseline. Risks and benefits reviewed. Pt states she's always tolerated SSRIs fine, just hasn't found them beneficial in the past. Will monitor closely for benefit.

## 2017-10-16 ENCOUNTER — Telehealth: Payer: Self-pay

## 2017-10-16 NOTE — Telephone Encounter (Signed)
Jamie Skains, Do you have this paper?

## 2017-10-16 NOTE — Telephone Encounter (Signed)
Has tried and failed triamcinolone and clobetasol in the past

## 2017-10-16 NOTE — Telephone Encounter (Signed)
Received fax from [place] about carisoprodol 2% ointment. States, "must use step therapy - trial of corticosteroid required for coverage"

## 2017-10-17 NOTE — Telephone Encounter (Signed)
Paper was just RX of carisoprodol 2% on it. W/ hand written note on it typed exactly as it was written.

## 2017-10-17 NOTE — Telephone Encounter (Signed)
PA submitted for medication.  Step therapies along with progress notes noted and submitted. Pending review.   KEY: [id number]

## 2017-10-23 NOTE — Telephone Encounter (Signed)
P.A. For EUCRISA 2% was approved for 12 fills from 10/15/17 - 10/14/2018.

## 2017-10-24 DIAGNOSIS — M545 Low back pain: Secondary | ICD-10-CM | POA: Diagnosis not present

## 2017-11-06 ENCOUNTER — Encounter: Payer: Self-pay | Admitting: Family Medicine

## 2017-11-07 ENCOUNTER — Other Ambulatory Visit: Payer: Self-pay | Admitting: Family Medicine

## 2017-11-07 MED ORDER — CLONAZEPAM 0.5 MG PO TABS: 0.5000 mg | ORAL_TABLET | Freq: Two times a day (BID) | ORAL | 0 refills | Status: DC | PRN

## 2017-11-21 ENCOUNTER — Other Ambulatory Visit (HOSPITAL_COMMUNITY): Payer: Self-pay | Admitting: Neurosurgery

## 2017-11-21 DIAGNOSIS — S32009K Unspecified fracture of unspecified lumbar vertebra, subsequent encounter for fracture with nonunion: Secondary | ICD-10-CM | POA: Diagnosis not present

## 2017-11-21 DIAGNOSIS — M961 Postlaminectomy syndrome, not elsewhere classified: Secondary | ICD-10-CM | POA: Diagnosis not present

## 2017-11-21 DIAGNOSIS — R030 Elevated blood-pressure reading, without diagnosis of hypertension: Secondary | ICD-10-CM | POA: Diagnosis not present

## 2017-11-21 DIAGNOSIS — Z6826 Body mass index (BMI) 26.0-26.9, adult: Secondary | ICD-10-CM | POA: Diagnosis not present

## 2017-12-09 ENCOUNTER — Other Ambulatory Visit: Payer: Self-pay | Admitting: Family Medicine

## 2017-12-09 NOTE — Telephone Encounter (Signed)
Copied from [name] [id number]. Topic: Quick Communication - See Telephone Encounter >> Dec 09, 2017  4:15 PM Conception Chancy, NT wrote: CRM for notification. See Telephone encounter for: 12/09/17.  Patient is requesting a refill on clonazePAM (KLONOPIN) 0.5 MG tablet. She has a physical with Apolonio Schneiders on 01/08/18. Please advise  [place] [place] [place] [address] [place] [address] Phone: [phone] Fax: [phone]

## 2017-12-10 MED ORDER — CLONAZEPAM 0.5 MG PO TABS: 0.5000 mg | ORAL_TABLET | Freq: Two times a day (BID) | ORAL | 1 refills | Status: DC | PRN

## 2017-12-10 NOTE — Telephone Encounter (Signed)
Klonopin refill Last OV: 10/15/17; upcoming visit 01/08/18 Last Refill:11/07/17 Pharmacy: [facility], [place] [phone] (Phone) [phone] (Fax)

## 2017-12-11 ENCOUNTER — Encounter: Payer: Self-pay | Admitting: Family Medicine

## 2018-01-02 DIAGNOSIS — M961 Postlaminectomy syndrome, not elsewhere classified: Secondary | ICD-10-CM | POA: Diagnosis not present

## 2018-01-02 DIAGNOSIS — M5136 Other intervertebral disc degeneration, lumbar region: Secondary | ICD-10-CM | POA: Diagnosis not present

## 2018-01-02 DIAGNOSIS — Z6826 Body mass index (BMI) 26.0-26.9, adult: Secondary | ICD-10-CM | POA: Diagnosis not present

## 2018-01-08 ENCOUNTER — Telehealth: Payer: Self-pay | Admitting: Family Medicine

## 2018-01-08 ENCOUNTER — Encounter: Payer: 59 | Admitting: Family Medicine

## 2018-01-08 NOTE — Telephone Encounter (Signed)
Pt called back and will contact pharmacy

## 2018-01-08 NOTE — Telephone Encounter (Signed)
Got 30 days with a refill 1 month ago. Not due. She should have one on file at [facility].

## 2018-01-08 NOTE — Telephone Encounter (Signed)
Patient needs a refill on her anxiety medication--she was unsure of the name of the medication. She stopped by the office and requested this medication  Thank You

## 2018-01-08 NOTE — Telephone Encounter (Signed)
LVM for patient to return phone call.  

## 2018-02-06 ENCOUNTER — Ambulatory Visit (INDEPENDENT_AMBULATORY_CARE_PROVIDER_SITE_OTHER): Payer: 59 | Admitting: Family Medicine

## 2018-02-06 ENCOUNTER — Encounter: Payer: Self-pay | Admitting: Family Medicine

## 2018-02-06 VITALS — BP 107/70 | HR 96 | Ht 66.3 in | Wt 165.1 lb

## 2018-02-06 DIAGNOSIS — Z1239 Encounter for other screening for malignant neoplasm of breast: Secondary | ICD-10-CM

## 2018-02-06 DIAGNOSIS — Z1231 Encounter for screening mammogram for malignant neoplasm of breast: Secondary | ICD-10-CM

## 2018-02-06 DIAGNOSIS — F3341 Major depressive disorder, recurrent, in partial remission: Secondary | ICD-10-CM | POA: Diagnosis not present

## 2018-02-06 DIAGNOSIS — F419 Anxiety disorder, unspecified: Secondary | ICD-10-CM | POA: Diagnosis not present

## 2018-02-06 DIAGNOSIS — Z0000 Encounter for general adult medical examination without abnormal findings: Secondary | ICD-10-CM

## 2018-02-06 DIAGNOSIS — Z0001 Encounter for general adult medical examination with abnormal findings: Secondary | ICD-10-CM | POA: Diagnosis not present

## 2018-02-06 LAB — MICROSCOPIC EXAMINATION: BACTERIA UA: NONE SEEN

## 2018-02-06 LAB — UA/M W/RFLX CULTURE, ROUTINE
BILIRUBIN UA: NEGATIVE
GLUCOSE, UA: NEGATIVE
Ketones, UA: NEGATIVE
LEUKOCYTES UA: NEGATIVE
Nitrite, UA: NEGATIVE
PH UA: 5.5 (ref 5.0–7.5)
Protein, UA: NEGATIVE
Specific Gravity, UA: 1.01 (ref 1.005–1.030)
UUROB: 0.2 mg/dL (ref 0.2–1.0)

## 2018-02-06 MED ORDER — VORTIOXETINE HBR 10 MG PO TABS: 10.0000 mg | ORAL_TABLET | Freq: Every day | ORAL | 3 refills | Status: DC

## 2018-02-06 MED ORDER — QUETIAPINE FUMARATE 50 MG PO TABS: 50.0000 mg | ORAL_TABLET | Freq: Every day | ORAL | 1 refills | Status: DC

## 2018-02-06 MED ORDER — CLONAZEPAM 0.5 MG PO TABS: 0.5000 mg | ORAL_TABLET | Freq: Two times a day (BID) | ORAL | 0 refills | Status: DC | PRN

## 2018-02-06 MED ORDER — CRISABOROLE 2 % EX OINT: 1.0000 "application " | TOPICAL_OINTMENT | Freq: Two times a day (BID) | CUTANEOUS | 6 refills | Status: DC | PRN

## 2018-02-06 NOTE — Addendum Note (Signed)
Addended by: Valerie Roys on: 02/06/2018 09:18 AM   Modules accepted: Orders

## 2018-02-06 NOTE — Assessment & Plan Note (Signed)
Not under good control. To be going through a divorce shortly. Has failed several medications in the past. Will start trintellex and recheck 1 month. Continue klonopin for now. Call with any concerns.

## 2018-02-06 NOTE — Progress Notes (Signed)
BP 107/70 (BP Location: Right Arm, Patient Position: Sitting, Cuff Size: Normal)   Pulse 96   Ht 5' 6.3" (1.684 m)   Wt 165 lb 2 oz (74.9 kg)   SpO2 100%   BMI 26.41 kg/m    Subjective:    Patient ID: Jamie [name], female    DOB: 1970-10-29, 47 y.o.   MRN: [id number]  HPI: Jamie [name] is a 47 y.o. female presenting on 02/06/2018 for comprehensive medical examination. Current medical complaints include:  Waiting to have back surgery. Continuing with pain. Has issues with her knee- following with orthopedics.   DEPRESSION/ANXIETY- stopped taking her zoloft. She notes that it was making her feel more anxious. She notes that her anxiety still seems to be not under good control. She feels like she's doing a bit better.  Mood status: better Satisfied with current treatment?: no Symptom severity: severe  Duration of current treatment : chronic Side effects: no Medication compliance: poor compliance Psychotherapy/counseling: no  Previous psychiatric medications: zoloft, effexor, wellbutrin, cymbalta- none worked particularly well. Depressed mood: yes Anxious mood: yes Anhedonia: no Significant weight loss or gain: no Insomnia: yes hard to fall asleep Fatigue: yes Feelings of worthlessness or guilt: yes Impaired concentration/indecisiveness: yes Suicidal ideations: no Hopelessness: no Crying spells: no Depression screen [facility] 2/9 02/06/2018 10/16/2016  Decreased Interest 2 2  Down, Depressed, Hopeless 2 2  PHQ - 2 Score 4 4  Altered sleeping 3 3  Tired, decreased energy 2 2  Change in appetite 2 2  Feeling bad or failure about yourself  3 2  Trouble concentrating 2 2  Moving slowly or fidgety/restless 2 1  Suicidal thoughts 0 0  PHQ-9 Score 18 16  Difficult doing work/chores Very difficult -   GAD 7 : Generalized Anxiety Score 02/06/2018 08/21/2017  Nervous, Anxious, on Edge 2 3  Control/stop worrying 2 3  Worry too much - different things 2 3  Trouble relaxing 3 3    Restless 3 3  Easily annoyed or irritable 1 3  Afraid - awful might happen 2 2  Total GAD 7 Score 15 20  Anxiety Difficulty Somewhat difficult Somewhat difficult   She currently lives with: parents Menopausal Symptoms: yes- hot flashes  Depression Screen done today and results listed below:  Depression screen [facility] 2/9 02/06/2018 10/16/2016  Decreased Interest 2 2  Down, Depressed, Hopeless 2 2  PHQ - 2 Score 4 4  Altered sleeping 3 3  Tired, decreased energy 2 2  Change in appetite 2 2  Feeling bad or failure about yourself  3 2  Trouble concentrating 2 2  Moving slowly or fidgety/restless 2 1  Suicidal thoughts 0 0  PHQ-9 Score 18 16  Difficult doing work/chores Very difficult -    Past Medical History:  Past Medical History:  Diagnosis Date  . Anxiety    takes Ativan daily.  Panic Attack  . Arthritis   . Chronic back pain    herniated disc/stenosis/scoliosis  . Depression    takes Effexor daily  . Eczema    uses a cream daily as needed  . Family history of adverse reaction to anesthesia    pta dad is very hard to wake up;excessive nausea  . History of bronchitis    > 60yr ago  . Hypoglycemia   . Insomnia    takes Lorazepam nightly  . Joint pain   . PONV (postoperative nausea and vomiting)   . Seasonal allergies    uses Flonase daily  .  Vitamin D deficiency    takes Vit D daily    Surgical History:  Past Surgical History:  Procedure Laterality Date  . BACK SURGERY  2008/2012   laminectomy 1st time and 2nd time laminectomy and fusion  . MAXIMUM ACCESS (MAS)POSTERIOR LUMBAR INTERBODY FUSION (PLIF) 2 LEVEL N/A 09/01/2014   Procedure: Lumbar four-five, Lumbar five-Sacral one Maximum Access Surgery posterior lumbar interbody fusion with interbody prosthesis posterior lateral arthrodesis posterior segmental instrumentation;  Surgeon: Elaina Hoops, MD;  Location: [facility];  Service: Neurosurgery;  Laterality: N/A;  Lumbar four-five, Lumbar five-Sacral one Maximum  Access Surgery posterior lumbar interbody fusion with i    Medications:  Current Outpatient Medications on File Prior to Visit  Medication Sig  . cholecalciferol (VITAMIN D) 1000 UNITS tablet Take 1,000 Units by mouth daily.  Stasia Cavalier (EUCRISA) 2 % OINT Apply 1 application topically 2 (two) times daily as needed.  . fluticasone (FLONASE) 50 MCG/ACT nasal spray Place 1 spray into both nostrils daily as needed for allergies.   Marland Kitchen gabapentin (NEURONTIN) 800 MG tablet Take 800 mg by mouth 3 (three) times daily.  . meloxicam (MOBIC) 7.5 MG tablet Take 7.5 mg by mouth daily.  Marland Kitchen morphine (KADIAN) 30 MG 24 hr capsule Take 30 mg by mouth daily.  Marland Kitchen oxyCODONE (ROXICODONE) 15 MG immediate release tablet Take 15 mg by mouth every 6 (six) hours as needed.    No current facility-administered medications on file prior to visit.     Allergies:  Allergies  Allergen Reactions  . Shellfish Allergy Hives  . Flexeril [Cyclobenzaprine] Hives    RAPID HEARTBEAT    Social History:  Social History   Socioeconomic History  . Marital status: Legally Separated    Spouse name: Not on file  . Number of children: Not on file  . Years of education: Not on file  . Highest education level: Not on file  Occupational History  . Not on file  Social Needs  . Financial resource strain: Not on file  . Food insecurity:    Worry: Not on file    Inability: Not on file  . Transportation needs:    Medical: Not on file    Non-medical: Not on file  Tobacco Use  . Smoking status: Former Smoker    Packs/day: 0.50    Years: 20.00    Pack years: 10.00    Last attempt to quit: 10/18/2017    Years since quitting: 0.3  . Smokeless tobacco: Never Used  Substance and Sexual Activity  . Alcohol use: No  . Drug use: No  . Sexual activity: Not Currently  Lifestyle  . Physical activity:    Days per week: Not on file    Minutes per session: Not on file  . Stress: Not on file  Relationships  . Social connections:      Talks on phone: Not on file    Gets together: Not on file    Attends religious service: Not on file    Active member of club or organization: Not on file    Attends meetings of clubs or organizations: Not on file    Relationship status: Not on file  . Intimate partner violence:    Fear of current or ex partner: Not on file    Emotionally abused: Not on file    Physically abused: Not on file    Forced sexual activity: Not on file  Other Topics Concern  . Not on file  Social History  Narrative  . Not on file   Social History   Tobacco Use  Smoking Status Former Smoker  . Packs/day: 0.50  . Years: 20.00  . Pack years: 10.00  . Last attempt to quit: 10/18/2017  . Years since quitting: 0.3  Smokeless Tobacco Never Used   Social History   Substance and Sexual Activity  Alcohol Use No    Family History:  Family History  Problem Relation Age of Onset  . Arthritis Mother   . Hyperlipidemia Mother   . Migraines Mother   . Arthritis Father   . Diabetes Father   . Heart disease Father   . Diabetes Brother     Past medical history, surgical history, medications, allergies, family history and social history reviewed with patient today and changes made to appropriate areas of the chart.   Review of Systems  Constitutional: Negative.   HENT: Negative.   Eyes: Negative.   Respiratory: Negative.   Cardiovascular: Negative.   Gastrointestinal: Negative.   Genitourinary: Negative.   Musculoskeletal: Positive for back pain, joint pain and myalgias. Negative for falls and neck pain.  Skin: Negative.   Neurological: Positive for tingling. Negative for dizziness, tremors, sensory change, speech change, focal weakness, seizures, loss of consciousness, weakness and headaches.  Endo/Heme/Allergies: Positive for environmental allergies. Negative for polydipsia. Does not bruise/bleed easily.  Psychiatric/Behavioral: Positive for depression. Negative for hallucinations, memory loss,  substance abuse and suicidal ideas. The patient is nervous/anxious and has insomnia.     All other ROS negative except what is listed above and in the HPI.      Objective:    BP 107/70 (BP Location: Right Arm, Patient Position: Sitting, Cuff Size: Normal)   Pulse 96   Ht 5' 6.3" (1.684 m)   Wt 165 lb 2 oz (74.9 kg)   SpO2 100%   BMI 26.41 kg/m   Wt Readings from Last 3 Encounters:  02/06/18 165 lb 2 oz (74.9 kg)  10/15/17 169 lb 8 oz (76.9 kg)  08/21/17 169 lb (76.7 kg)    Physical Exam  Constitutional: She is oriented to person, place, and time. She appears well-developed and well-nourished. No distress.  HENT:  Head: Normocephalic and atraumatic.  Right Ear: Hearing, tympanic membrane, external ear and ear canal normal.  Left Ear: Hearing, tympanic membrane, external ear and ear canal normal.  Nose: Nose normal.  Mouth/Throat: Uvula is midline, oropharynx is clear and moist and mucous membranes are normal. No oropharyngeal exudate.  Eyes: Pupils are equal, round, and reactive to light. Conjunctivae, EOM and lids are normal. Right eye exhibits no discharge. Left eye exhibits no discharge. No scleral icterus.  Neck: Normal range of motion. Neck supple. No JVD present. No tracheal deviation present. No thyromegaly present.  Cardiovascular: Normal rate, regular rhythm, normal heart sounds and intact distal pulses. Exam reveals no gallop and no friction rub.  No murmur heard. Pulmonary/Chest: Effort normal and breath sounds normal. No stridor. No respiratory distress. She has no wheezes. She has no rales. She exhibits no tenderness. Right breast exhibits inverted nipple. Right breast exhibits no mass, no nipple discharge, no skin change and no tenderness. Left breast exhibits no inverted nipple, no mass, no nipple discharge, no skin change and no tenderness. No breast swelling, tenderness, discharge or bleeding. Breasts are symmetrical.  Abdominal: Soft. Bowel sounds are normal. She  exhibits no distension and no mass. There is no tenderness. There is no rebound and no guarding. No hernia.  Genitourinary:  Genitourinary Comments:  Pelvic exam deferred with shared decision making  Musculoskeletal: Normal range of motion. She exhibits no edema, tenderness or deformity.  Lymphadenopathy:    She has no cervical adenopathy.  Neurological: She is alert and oriented to person, place, and time. She displays normal reflexes. A sensory deficit is present. No cranial nerve deficit. She exhibits normal muscle tone. Coordination normal.  Skin: Skin is warm, dry and intact. Capillary refill takes less than 2 seconds. No rash noted. She is not diaphoretic. No erythema. No pallor.  Psychiatric: She has a normal mood and affect. Her speech is normal and behavior is normal. Judgment and thought content normal. Cognition and memory are normal.  Nursing note and vitals reviewed.   Results for orders placed or performed in visit on 10/16/16  CBC with Differential/Platelet  Result Value Ref Range   WBC 5.6 3.4 - 10.8 x10E3/uL   RBC 4.27 3.77 - 5.28 x10E6/uL   Hemoglobin 13.7 11.1 - 15.9 g/dL   Hematocrit 40.5 34.0 - 46.6 %   MCV 95 79 - 97 fL   MCH 32.1 26.6 - 33.0 pg   MCHC 33.8 31.5 - 35.7 g/dL   RDW 14.0 12.3 - 15.4 %   Platelets 225 150 - 379 x10E3/uL   Neutrophils 51 Not Estab. %   Lymphs 42 Not Estab. %   Monocytes 7 Not Estab. %   Eos 0 Not Estab. %   Basos 0 Not Estab. %   Neutrophils Absolute 2.9 1.4 - 7.0 x10E3/uL   Lymphocytes Absolute 2.4 0.7 - 3.1 x10E3/uL   Monocytes Absolute 0.4 0.1 - 0.9 x10E3/uL   EOS (ABSOLUTE) 0.0 0.0 - 0.4 x10E3/uL   Basophils Absolute 0.0 0.0 - 0.2 x10E3/uL   Immature Granulocytes 0 Not Estab. %   Immature Grans (Abs) 0.0 0.0 - 0.1 x10E3/uL  Comprehensive metabolic panel  Result Value Ref Range   Glucose 109 (H) 65 - 99 mg/dL   BUN 11 6 - 24 mg/dL   Creatinine, Ser 0.67 0.57 - 1.00 mg/dL   GFR calc non Af Amer 105 >59 mL/min/1.73   GFR  calc Af Amer 121 >59 mL/min/1.73   BUN/Creatinine Ratio 16 9 - 23   Sodium 140 134 - 144 mmol/L   Potassium 4.8 3.5 - 5.2 mmol/L   Chloride 102 96 - 106 mmol/L   CO2 25 18 - 29 mmol/L   Calcium 9.6 8.7 - 10.2 mg/dL   Total Protein 6.9 6.0 - 8.5 g/dL   Albumin 4.4 3.5 - 5.5 g/dL   Globulin, Total 2.5 1.5 - 4.5 g/dL   Albumin/Globulin Ratio 1.8 1.2 - 2.2   Bilirubin Total 0.3 0.0 - 1.2 mg/dL   Alkaline Phosphatase 55 39 - 117 IU/L   AST 16 0 - 40 IU/L   ALT 17 0 - 32 IU/L  Lipid panel  Result Value Ref Range   Cholesterol, Total 180 100 - 199 mg/dL   Triglycerides 78 0 - 149 mg/dL   HDL 50 >39 mg/dL   VLDL Cholesterol Cal 16 5 - 40 mg/dL   LDL Calculated 114 (H) 0 - 99 mg/dL   Chol/HDL Ratio 3.6 0.0 - 4.4 ratio units  TSH  Result Value Ref Range   TSH 0.943 0.450 - 4.500 uIU/mL  Urinalysis, Routine w reflex microscopic  Result Value Ref Range   Specific Gravity, UA >1.030 (H) 1.005 - 1.030   pH, UA 5.0 5.0 - 7.5   Color, UA Yellow Yellow   Appearance Ur Cloudy (A)  Clear   Leukocytes, UA Negative Negative   Protein, UA Negative Negative/Trace   Glucose, UA Negative Negative   Ketones, UA Negative Negative   RBC, UA Negative Negative   Bilirubin, UA Negative Negative   Urobilinogen, Ur 0.2 0.2 - 1.0 mg/dL   Nitrite, UA Negative Negative  HIV antibody  Result Value Ref Range   HIV Screen 4th Generation wRfx Non Reactive Non Reactive      Assessment & Plan:   Problem List Items Addressed This Visit      Other   Depression    Not under good control. To be going through a divorce shortly. Has failed several medications in the past. Will start trintellex and recheck 1 month. Continue klonopin for now. Call with any concerns.       Relevant Medications   vortioxetine HBr (TRINTELLIX) 10 MG TABS tablet   Anxiety    Not under good control. To be going through a divorce shortly. Has failed several medications in the past. Will start trintellex and recheck 1 month. Continue  klonopin for now. Call with any concerns.       Relevant Medications   vortioxetine HBr (TRINTELLIX) 10 MG TABS tablet    Other Visit Diagnoses    Routine general medical examination at a health care facility    -  Primary   Vaccines up to date. Screening labs checked today. Mammogram ordered. Pap up to date. Continue diet and exercise. Call with any concerns.    Relevant Orders   CBC with Differential/Platelet   Comprehensive metabolic panel   Lipid Panel w/o Chol/HDL Ratio   TSH   UA/M w/rflx Culture, Routine   Screening for breast cancer       Mammogram ordered today.   Relevant Orders   MM DIGITAL SCREENING BILATERAL       Follow up plan: Return in about 1 month (around 03/06/2018) for Follow up mood.   LABORATORY TESTING:  - Pap smear: up to date  IMMUNIZATIONS:   - Tdap: Tetanus vaccination status reviewed: last tetanus booster within 10 years. - Influenza: Up to date  SCREENING: -Mammogram: Ordered today   PATIENT COUNSELING:   Advised to take 1 mg of folate supplement per day if capable of pregnancy.   Sexuality: Discussed sexually transmitted diseases, partner selection, use of condoms, avoidance of unintended pregnancy  and contraceptive alternatives.   Advised to avoid cigarette smoking.  I discussed with the patient that most people either abstain from alcohol or drink within safe limits (<=14/week and <=4 drinks/occasion for males, <=7/weeks and <= 3 drinks/occasion for females) and that the risk for alcohol disorders and other health effects rises proportionally with the number of drinks per week and how often a drinker exceeds daily limits.  Discussed cessation/primary prevention of drug use and availability of treatment for abuse.   Diet: Encouraged to adjust caloric intake to maintain  or achieve ideal body weight, to reduce intake of dietary saturated fat and total fat, to limit sodium intake by avoiding high sodium foods and not adding table salt, and  to maintain adequate dietary potassium and calcium preferably from fresh fruits, vegetables, and low-fat dairy products.    stressed the importance of regular exercise  Injury prevention: Discussed safety belts, safety helmets, smoke detector, smoking near bedding or upholstery.   Dental health: Discussed importance of regular tooth brushing, flossing, and dental visits.    NEXT PREVENTATIVE PHYSICAL DUE IN 1 YEAR. Return in about 1 month (around 03/06/2018) for  Follow up mood.

## 2018-02-06 NOTE — Patient Instructions (Addendum)
[facility] at [facility]  Address: [address] [place], [place], [place] [address]  Phone: [phone]  Health Maintenance, Female Adopting a healthy lifestyle and getting preventive care can go a long way to promote health and wellness. Talk with your health care provider about what schedule of regular examinations is right for you. This is a good chance for you to check in with your provider about disease prevention and staying healthy. In between checkups, there are plenty of things you can do on your own. Experts have done a lot of research about which lifestyle changes and preventive measures are most likely to keep you healthy. Ask your health care provider for more information. Weight and diet Eat a healthy diet  Be sure to include plenty of vegetables, fruits, low-fat dairy products, and lean protein.  Do not eat a lot of foods high in solid fats, added sugars, or salt.  Get regular exercise. This is one of the most important things you can do for your health. ? Most adults should exercise for at least 150 minutes each week. The exercise should increase your heart rate and make you sweat (moderate-intensity exercise). ? Most adults should also do strengthening exercises at least twice a week. This is in addition to the moderate-intensity exercise.  Maintain a healthy weight  Body mass index (BMI) is a measurement that can be used to identify possible weight problems. It estimates body fat based on height and weight. Your health care provider can help determine your BMI and help you achieve or maintain a healthy weight.  For females 75 years of age and older: ? A BMI below 18.5 is considered underweight. ? A BMI of 18.5 to 24.9 is normal. ? A BMI of 25 to 29.9 is considered overweight. ? A BMI of 30 and above is considered obese.  Watch levels of cholesterol and blood lipids  You should start having your blood tested for lipids and cholesterol at 49 years of  age, then have this test every 5 years.  You may need to have your cholesterol levels checked more often if: ? Your lipid or cholesterol levels are high. ? You are older than 49 years of age. ? You are at high risk for heart disease.  Cancer screening Lung Cancer  Lung cancer screening is recommended for adults 52-5 years old who are at high risk for lung cancer because of a history of smoking.  A yearly low-dose CT scan of the lungs is recommended for people who: ? Currently smoke. ? Have quit within the past 15 years. ? Have at least a 30-pack-year history of smoking. A pack year is smoking an average of one pack of cigarettes a day for 1 year.  Yearly screening should continue until it has been 15 years since you quit.  Yearly screening should stop if you develop a health problem that would prevent you from having lung cancer treatment.  Breast Cancer  Practice breast self-awareness. This means understanding how your breasts normally appear and feel.  It also means doing regular breast self-exams. Let your health care provider know about any changes, no matter how small.  If you are in your 20s or 30s, you should have a clinical breast exam (CBE) by a health care provider every 1-3 years as part of a regular health exam.  If you are 9 or older, have a CBE every year. Also consider having a breast X-ray (mammogram) every year.  If you have a  family history of breast cancer, talk to your health care provider about genetic screening.  If you are at high risk for breast cancer, talk to your health care provider about having an MRI and a mammogram every year.  Breast cancer gene (BRCA) assessment is recommended for women who have family members with BRCA-related cancers. BRCA-related cancers include: ? Breast. ? Ovarian. ? Tubal. ? Peritoneal cancers.  Results of the assessment will determine the need for genetic counseling and BRCA1 and BRCA2 testing.  Cervical  Cancer Your health care provider may recommend that you be screened regularly for cancer of the pelvic organs (ovaries, uterus, and vagina). This screening involves a pelvic examination, including checking for microscopic changes to the surface of your cervix (Pap test). You may be encouraged to have this screening done every 3 years, beginning at age 21.  For women ages 30-65, health care providers may recommend pelvic exams and Pap testing every 3 years, or they may recommend the Pap and pelvic exam, combined with testing for human papilloma virus (HPV), every 5 years. Some types of HPV increase your risk of cervical cancer. Testing for HPV may also be done on women of any age with unclear Pap test results.  Other health care providers may not recommend any screening for nonpregnant women who are considered low risk for pelvic cancer and who do not have symptoms. Ask your health care provider if a screening pelvic exam is right for you.  If you have had past treatment for cervical cancer or a condition that could lead to cancer, you need Pap tests and screening for cancer for at least 20 years after your treatment. If Pap tests have been discontinued, your risk factors (such as having a new sexual partner) need to be reassessed to determine if screening should resume. Some women have medical problems that increase the chance of getting cervical cancer. In these cases, your health care provider may recommend more frequent screening and Pap tests.  Colorectal Cancer  This type of cancer can be detected and often prevented.  Routine colorectal cancer screening usually begins at 50 years of age and continues through 49 years of age.  Your health care provider may recommend screening at an earlier age if you have risk factors for colon cancer.  Your health care provider may also recommend using home test kits to check for hidden blood in the stool.  A small camera at the end of a tube can be used to  examine your colon directly (sigmoidoscopy or colonoscopy). This is done to check for the earliest forms of colorectal cancer.  Routine screening usually begins at age 50.  Direct examination of the colon should be repeated every 5-10 years through 49 years of age. However, you may need to be screened more often if early forms of precancerous polyps or small growths are found.  Skin Cancer  Check your skin from head to toe regularly.  Tell your health care provider about any new moles or changes in moles, especially if there is a change in a mole's shape or color.  Also tell your health care provider if you have a mole that is larger than the size of a pencil eraser.  Always use sunscreen. Apply sunscreen liberally and repeatedly throughout the day.  Protect yourself by wearing long sleeves, pants, a wide-brimmed hat, and sunglasses whenever you are outside.  Heart disease, diabetes, and high blood pressure  High blood pressure causes heart disease and increases the risk of   stroke. High blood pressure is more likely to develop in: ? People who have blood pressure in the high end of the normal range (130-139/85-89 mm Hg). ? People who are overweight or obese. ? People who are African American.  If you are 18-39 years of age, have your blood pressure checked every 3-5 years. If you are 40 years of age or older, have your blood pressure checked every year. You should have your blood pressure measured twice-once when you are at a hospital or clinic, and once when you are not at a hospital or clinic. Record the average of the two measurements. To check your blood pressure when you are not at a hospital or clinic, you can use: ? An automated blood pressure machine at a pharmacy. ? A home blood pressure monitor.  If you are between 55 years and 79 years old, ask your health care provider if you should take aspirin to prevent strokes.  Have regular diabetes screenings. This involves taking a  blood sample to check your fasting blood sugar level. ? If you are at a normal weight and have a low risk for diabetes, have this test once every three years after 49 years of age. ? If you are overweight and have a high risk for diabetes, consider being tested at a younger age or more often. Preventing infection Hepatitis B  If you have a higher risk for hepatitis B, you should be screened for this virus. You are considered at high risk for hepatitis B if: ? You were born in a country where hepatitis B is common. Ask your health care provider which countries are considered high risk. ? Your parents were born in a high-risk country, and you have not been immunized against hepatitis B (hepatitis B vaccine). ? You have HIV or AIDS. ? You use needles to inject street drugs. ? You live with someone who has hepatitis B. ? You have had sex with someone who has hepatitis B. ? You get hemodialysis treatment. ? You take certain medicines for conditions, including cancer, organ transplantation, and autoimmune conditions.  Hepatitis C  Blood testing is recommended for: ? Everyone born from 1945 through 1965. ? Anyone with known risk factors for hepatitis C.  Sexually transmitted infections (STIs)  You should be screened for sexually transmitted infections (STIs) including gonorrhea and chlamydia if: ? You are sexually active and are younger than 49 years of age. ? You are older than 49 years of age and your health care provider tells you that you are at risk for this type of infection. ? Your sexual activity has changed since you were last screened and you are at an increased risk for chlamydia or gonorrhea. Ask your health care provider if you are at risk.  If you do not have HIV, but are at risk, it may be recommended that you take a prescription medicine daily to prevent HIV infection. This is called pre-exposure prophylaxis (PrEP). You are considered at risk if: ? You are sexually active and  do not regularly use condoms or know the HIV status of your partner(s). ? You take drugs by injection. ? You are sexually active with a partner who has HIV.  Talk with your health care provider about whether you are at high risk of being infected with HIV. If you choose to begin PrEP, you should first be tested for HIV. You should then be tested every 3 months for as long as you are taking PrEP. Pregnancy  If   you are premenopausal and you may become pregnant, ask your health care provider about preconception counseling.  If you may become pregnant, take 400 to 800 micrograms (mcg) of folic acid every day.  If you want to prevent pregnancy, talk to your health care provider about birth control (contraception). Osteoporosis and menopause  Osteoporosis is a disease in which the bones lose minerals and strength with aging. This can result in serious bone fractures. Your risk for osteoporosis can be identified using a bone density scan.  If you are 23 years of age or older, or if you are at risk for osteoporosis and fractures, ask your health care provider if you should be screened.  Ask your health care provider whether you should take a calcium or vitamin D supplement to lower your risk for osteoporosis.  Menopause may have certain physical symptoms and risks.  Hormone replacement therapy may reduce some of these symptoms and risks. Talk to your health care provider about whether hormone replacement therapy is right for you. Follow these instructions at home:  Schedule regular health, dental, and eye exams.  Stay current with your immunizations.  Do not use any tobacco products including cigarettes, chewing tobacco, or electronic cigarettes.  If you are pregnant, do not drink alcohol.  If you are breastfeeding, limit how much and how often you drink alcohol.  Limit alcohol intake to no more than 1 drink per day for nonpregnant women. One drink equals 12 ounces of beer, 5 ounces of  wine, or 1 ounces of hard liquor.  Do not use street drugs.  Do not share needles.  Ask your health care provider for help if you need support or information about quitting drugs.  Tell your health care provider if you often feel depressed.  Tell your health care provider if you have ever been abused or do not feel safe at home. This information is not intended to replace advice given to you by your health care provider. Make sure you discuss any questions you have with your health care provider. Document Released: 02/19/2011 Document Revised: 01/12/2016 Document Reviewed: 05/10/2015 Elsevier Interactive Patient Education  [employer].

## 2018-02-07 LAB — COMPREHENSIVE METABOLIC PANEL
ALT: 16 IU/L (ref 0–32)
AST: 15 IU/L (ref 0–40)
Albumin/Globulin Ratio: 1.9 (ref 1.2–2.2)
Albumin: 4.5 g/dL (ref 3.5–5.5)
Alkaline Phosphatase: 74 IU/L (ref 39–117)
BUN/Creatinine Ratio: 13 (ref 9–23)
BUN: 10 mg/dL (ref 6–24)
Bilirubin Total: 0.3 mg/dL (ref 0.0–1.2)
CALCIUM: 9.6 mg/dL (ref 8.7–10.2)
CO2: 23 mmol/L (ref 20–29)
Chloride: 105 mmol/L (ref 96–106)
Creatinine, Ser: 0.77 mg/dL (ref 0.57–1.00)
GFR, EST AFRICAN AMERICAN: 106 mL/min/{1.73_m2} (ref >59)
GFR, EST NON AFRICAN AMERICAN: 92 mL/min/{1.73_m2} (ref >59)
GLUCOSE: 94 mg/dL (ref 65–99)
Globulin, Total: 2.4 g/dL (ref 1.5–4.5)
Potassium: 4.8 mmol/L (ref 3.5–5.2)
Sodium: 141 mmol/L (ref 134–144)
TOTAL PROTEIN: 6.9 g/dL (ref 6.0–8.5)

## 2018-02-07 LAB — LIPID PANEL W/O CHOL/HDL RATIO
Cholesterol, Total: 171 mg/dL (ref 100–199)
HDL: 51 mg/dL (ref >39)
LDL CALC: 107 mg/dL — AB (ref 0–99)
TRIGLYCERIDES: 65 mg/dL (ref 0–149)
VLDL CHOLESTEROL CAL: 13 mg/dL (ref 5–40)

## 2018-02-07 LAB — CBC WITH DIFFERENTIAL/PLATELET
BASOS ABS: 0 10*3/uL (ref 0.0–0.2)
BASOS: 0 %
EOS (ABSOLUTE): 0 10*3/uL (ref 0.0–0.4)
Eos: 1 %
Hematocrit: 42.4 % (ref 34.0–46.6)
Hemoglobin: 13.8 g/dL (ref 11.1–15.9)
IMMATURE GRANS (ABS): 0 10*3/uL (ref 0.0–0.1)
IMMATURE GRANULOCYTES: 0 %
LYMPHS: 45 %
Lymphocytes Absolute: 3.1 10*3/uL (ref 0.7–3.1)
MCH: 31.5 pg (ref 26.6–33.0)
MCHC: 32.5 g/dL (ref 31.5–35.7)
MCV: 97 fL (ref 79–97)
Monocytes Absolute: 0.5 10*3/uL (ref 0.1–0.9)
Monocytes: 7 %
NEUTROS PCT: 47 %
Neutrophils Absolute: 3.3 10*3/uL (ref 1.4–7.0)
PLATELETS: 220 10*3/uL (ref 150–450)
RBC: 4.38 x10E6/uL (ref 3.77–5.28)
RDW: 13.5 % (ref 12.3–15.4)
WBC: 7 10*3/uL (ref 3.4–10.8)

## 2018-02-07 LAB — TSH: TSH: 1.29 u[IU]/mL (ref 0.450–4.500)

## 2018-02-12 ENCOUNTER — Telehealth: Payer: Self-pay

## 2018-02-12 NOTE — Telephone Encounter (Signed)
P.A. For Trintellix was submitted via covermymeds. If denied preferred medications are: Citalopram, escitalopram, Fluoxetine, Paroxetine, Sertraline, Duloxetine, Venlafaxine

## 2018-03-03 ENCOUNTER — Telehealth: Payer: Self-pay | Admitting: Family Medicine

## 2018-03-03 NOTE — Telephone Encounter (Signed)
Copied from [place] [id number]. Topic: Quick Communication - See Telephone Encounter >> Mar 03, 2018  4:46 PM Vernona Rieger wrote: CRM for notification. See Telephone encounter for: 03/03/18.  Patient would like to know what is going on with the PA for vortioxetine HBr (TRINTELLIX) 10 MG TABS tablet. Pharmacy sent something and they have not heard back. Please advise.  Patient said she was suppose to have a mammogram as well and has not heard from anyone about it.

## 2018-03-03 NOTE — Telephone Encounter (Signed)
Anything you can check on for this patient?

## 2018-03-03 NOTE — Telephone Encounter (Signed)
Copied from [place] [id number]. Topic: Quick Communication - See Telephone Encounter >> Mar 03, 2018  4:50 PM Vernona Rieger wrote: CRM for notification. See Telephone encounter for: 03/03/18.  clonazePAM (KLONOPIN) 0.5 MG tablet  [facility] - [place], [place] - [address]

## 2018-03-04 MED ORDER — CLONAZEPAM 0.5 MG PO TABS: 0.5000 mg | ORAL_TABLET | Freq: Two times a day (BID) | ORAL | 1 refills | Status: DC | PRN

## 2018-03-04 NOTE — Telephone Encounter (Signed)
Patient was transferred to provider for telephone conversation.   

## 2018-03-04 NOTE — Telephone Encounter (Signed)
Incoming call from patient requesting refill on Klonopin 0.5   LOV  10/15/17 with Merrie Roof     Last refill:  02/06/18 started   Pharmacy :  [facility]

## 2018-03-04 NOTE — Telephone Encounter (Signed)
P.A. Was denied. Per encounter from 02/12/18 "P.A. For Trintellix was submitted via covermymeds. If denied preferred medications are: Citalopram, escitalopram, Fluoxetine, Paroxetine, Sertraline, Duloxetine, Venlafaxine" Will contact patient with number for mammogram scheduling. Dr. Wynetta Emery does have order placed.

## 2018-03-04 NOTE — Telephone Encounter (Signed)
Call pt 

## 2018-03-04 NOTE — Addendum Note (Signed)
Addended by: Golden Pop A on: 03/04/2018 04:13 PM   Modules accepted: Orders

## 2018-03-04 NOTE — Telephone Encounter (Signed)
Left message on machine for pt to return call to the office.  

## 2018-03-04 NOTE — Telephone Encounter (Signed)
Discussed with patient complications with medications will address at next office visit make an appointment

## 2018-03-04 NOTE — Telephone Encounter (Signed)
Mychart message sent to patient with contact information of [place].

## 2018-03-05 ENCOUNTER — Ambulatory Visit: Payer: 59 | Admitting: Family Medicine

## 2018-03-05 DIAGNOSIS — M961 Postlaminectomy syndrome, not elsewhere classified: Secondary | ICD-10-CM | POA: Diagnosis not present

## 2018-03-05 DIAGNOSIS — Z6827 Body mass index (BMI) 27.0-27.9, adult: Secondary | ICD-10-CM | POA: Diagnosis not present

## 2018-03-05 DIAGNOSIS — R030 Elevated blood-pressure reading, without diagnosis of hypertension: Secondary | ICD-10-CM | POA: Diagnosis not present

## 2018-03-05 DIAGNOSIS — M5136 Other intervertebral disc degeneration, lumbar region: Secondary | ICD-10-CM | POA: Diagnosis not present

## 2018-03-20 DIAGNOSIS — M1711 Unilateral primary osteoarthritis, right knee: Secondary | ICD-10-CM | POA: Diagnosis not present

## 2018-04-02 ENCOUNTER — Encounter: Payer: Self-pay | Admitting: Physician Assistant

## 2018-04-02 ENCOUNTER — Ambulatory Visit
Admission: RE | Admit: 2018-04-02 | Discharge: 2018-04-02 | Disposition: A | Discharge status: Home / Self Care | Payer: 59 | Source: Ambulatory Visit | Attending: Family Medicine | Admitting: Family Medicine

## 2018-04-02 ENCOUNTER — Ambulatory Visit (INDEPENDENT_AMBULATORY_CARE_PROVIDER_SITE_OTHER): Payer: 59 | Admitting: Physician Assistant

## 2018-04-02 ENCOUNTER — Other Ambulatory Visit: Payer: Self-pay | Admitting: Family Medicine

## 2018-04-02 VITALS — BP 116/75 | HR 75 | Temp 98.4°F | Ht 66.3 in | Wt 166.3 lb

## 2018-04-02 DIAGNOSIS — R928 Other abnormal and inconclusive findings on diagnostic imaging of breast: Secondary | ICD-10-CM

## 2018-04-02 DIAGNOSIS — F3341 Major depressive disorder, recurrent, in partial remission: Secondary | ICD-10-CM

## 2018-04-02 DIAGNOSIS — Z1239 Encounter for other screening for malignant neoplasm of breast: Secondary | ICD-10-CM

## 2018-04-02 DIAGNOSIS — Z1231 Encounter for screening mammogram for malignant neoplasm of breast: Secondary | ICD-10-CM | POA: Insufficient documentation

## 2018-04-02 DIAGNOSIS — F419 Anxiety disorder, unspecified: Secondary | ICD-10-CM | POA: Diagnosis not present

## 2018-04-02 DIAGNOSIS — N6320 Unspecified lump in the left breast, unspecified quadrant: Secondary | ICD-10-CM

## 2018-04-02 MED ORDER — VORTIOXETINE HBR 10 MG PO TABS: 10.0000 mg | ORAL_TABLET | Freq: Every day | ORAL | 0 refills | Status: AC

## 2018-04-02 NOTE — Patient Instructions (Signed)

## 2018-04-02 NOTE — Progress Notes (Signed)
Subjective:    Patient ID: Jamie [name], female    DOB: 1971-04-20, 47 y.o.   MRN: [id number]  Jamie [name] is a 47 y.o. female presenting on 04/02/2018 for Depression   HPI   She is following up for depression and anxiety. She is having increased work stress. She is currently taking Seroquel for sleep. She was previously on zoloft, effexor, wellbutrin and cymbalta without success. A PA was recently performed for Trintellix. She denies SI/HI today.     Social History   Tobacco Use  . Smoking status: Former Smoker    Packs/day: 0.50    Years: 20.00    Pack years: 10.00    Last attempt to quit: 10/18/2017    Years since quitting: 0.4  . Smokeless tobacco: Never Used  Substance Use Topics  . Alcohol use: No  . Drug use: No    Review of Systems Per HPI unless specifically indicated above     Objective:    BP 116/75   Pulse 75   Temp 98.4 F (36.9 C) (Oral)   Ht 5' 6.3" (1.684 m)   Wt 166 lb 4.8 oz (75.4 kg)   SpO2 98%   BMI 26.60 kg/m   Wt Readings from Last 3 Encounters:  04/02/18 166 lb 4.8 oz (75.4 kg)  02/06/18 165 lb 2 oz (74.9 kg)  10/15/17 169 lb 8 oz (76.9 kg)    Physical Exam  Constitutional: She is oriented to person, place, and time. She appears well-developed and well-nourished.  Cardiovascular: Normal rate and regular rhythm.  Pulmonary/Chest: Effort normal and breath sounds normal.  Neurological: She is alert and oriented to person, place, and time.  Psychiatric: She has a normal mood and affect. Her behavior is normal.     Office Visit from 04/02/2018 in [facility] Total Score  19      Results for orders placed or performed in visit on 02/06/18  Microscopic Examination  Result Value Ref Range   WBC, UA 0-5 0 - 5 /hpf   RBC, UA 0-2 0 - 2 /hpf   Epithelial Cells (non renal) 0-10 0 - 10 /hpf   Bacteria, UA None seen None seen/Few  CBC with Differential/Platelet  Result Value Ref Range   WBC 7.0 3.4 - 10.8 x10E3/uL   RBC 4.38 3.77 - 5.28 x10E6/uL   Hemoglobin 13.8 11.1 - 15.9 g/dL   Hematocrit 42.4 34.0 - 46.6 %   MCV 97 79 - 97 fL   MCH 31.5 26.6 - 33.0 pg   MCHC 32.5 31.5 - 35.7 g/dL   RDW 13.5 12.3 - 15.4 %   Platelets 220 150 - 450 x10E3/uL   Neutrophils 47 Not Estab. %   Lymphs 45 Not Estab. %   Monocytes 7 Not Estab. %   Eos 1 Not Estab. %   Basos 0 Not Estab. %   Neutrophils Absolute 3.3 1.4 - 7.0 x10E3/uL   Lymphocytes Absolute 3.1 0.7 - 3.1 x10E3/uL   Monocytes Absolute 0.5 0.1 - 0.9 x10E3/uL   EOS (ABSOLUTE) 0.0 0.0 - 0.4 x10E3/uL   Basophils Absolute 0.0 0.0 - 0.2 x10E3/uL   Immature Granulocytes 0 Not Estab. %   Immature Grans (Abs) 0.0 0.0 - 0.1 x10E3/uL  Comprehensive metabolic panel  Result Value Ref Range   Glucose 94 65 - 99 mg/dL   BUN 10 6 - 24 mg/dL   Creatinine, Ser 0.77 0.57 - 1.00 mg/dL   GFR calc non Af Amer 92 >59  mL/min/1.73   GFR calc Af Amer 106 >59 mL/min/1.73   BUN/Creatinine Ratio 13 9 - 23   Sodium 141 134 - 144 mmol/L   Potassium 4.8 3.5 - 5.2 mmol/L   Chloride 105 96 - 106 mmol/L   CO2 23 20 - 29 mmol/L   Calcium 9.6 8.7 - 10.2 mg/dL   Total Protein 6.9 6.0 - 8.5 g/dL   Albumin 4.5 3.5 - 5.5 g/dL   Globulin, Total 2.4 1.5 - 4.5 g/dL   Albumin/Globulin Ratio 1.9 1.2 - 2.2   Bilirubin Total 0.3 0.0 - 1.2 mg/dL   Alkaline Phosphatase 74 39 - 117 IU/L   AST 15 0 - 40 IU/L   ALT 16 0 - 32 IU/L  Lipid Panel w/o Chol/HDL Ratio  Result Value Ref Range   Cholesterol, Total 171 100 - 199 mg/dL   Triglycerides 65 0 - 149 mg/dL   HDL 51 >39 mg/dL   VLDL Cholesterol Cal 13 5 - 40 mg/dL   LDL Calculated 107 (H) 0 - 99 mg/dL  TSH  Result Value Ref Range   TSH 1.290 0.450 - 4.500 uIU/mL  UA/M w/rflx Culture, Routine  Result Value Ref Range   Specific Gravity, UA 1.010 1.005 - 1.030   pH, UA 5.5 5.0 - 7.5   Color, UA Yellow Yellow   Appearance Ur Clear Clear   Leukocytes, UA Negative Negative   Protein, UA Negative Negative/Trace   Glucose, UA Negative  Negative   Ketones, UA Negative Negative   RBC, UA Trace (A) Negative   Bilirubin, UA Negative Negative   Urobilinogen, Ur 0.2 0.2 - 1.0 mg/dL   Nitrite, UA Negative Negative   Microscopic Examination See below:       Assessment & Plan:  1. Anxiety  Trintellix has been approved per 02/14/2018 scan in [facility]. Start as below and follow up in one month.   - vortioxetine HBr (TRINTELLIX) 10 MG TABS tablet; Take 1 tablet (10 mg total) by mouth daily.  Dispense: 90 tablet; Refill: 0  2. Recurrent major depressive disorder, in partial remission (HCC)  - vortioxetine HBr (TRINTELLIX) 10 MG TABS tablet; Take 1 tablet (10 mg total) by mouth daily.  Dispense: 90 tablet; Refill: 0     Follow up plan: Return in about 1 month (around 05/03/2018) for depression and anxiety .  Carles Collet, PA-C  [facility] 04/02/2018, 10:39 AM

## 2018-04-09 ENCOUNTER — Ambulatory Visit: Payer: 59

## 2018-04-15 ENCOUNTER — Other Ambulatory Visit: Payer: Self-pay | Admitting: Family Medicine

## 2018-04-15 ENCOUNTER — Other Ambulatory Visit: Payer: Self-pay | Admitting: *Deleted

## 2018-04-15 ENCOUNTER — Inpatient Hospital Stay
Admission: RE | Admit: 2018-04-15 | Discharge: 2018-04-15 | Disposition: A | Discharge status: Home / Self Care | Payer: Self-pay | Source: Ambulatory Visit | Attending: *Deleted | Admitting: *Deleted

## 2018-04-15 DIAGNOSIS — Z9289 Personal history of other medical treatment: Secondary | ICD-10-CM

## 2018-04-15 DIAGNOSIS — N6320 Unspecified lump in the left breast, unspecified quadrant: Secondary | ICD-10-CM

## 2018-04-15 DIAGNOSIS — R928 Other abnormal and inconclusive findings on diagnostic imaging of breast: Secondary | ICD-10-CM

## 2018-04-22 ENCOUNTER — Ambulatory Visit
Admission: RE | Admit: 2018-04-22 | Discharge: 2018-04-22 | Disposition: A | Discharge status: Home / Self Care | Payer: 59 | Source: Ambulatory Visit | Attending: Physician Assistant | Admitting: Physician Assistant

## 2018-04-22 DIAGNOSIS — N6320 Unspecified lump in the left breast, unspecified quadrant: Secondary | ICD-10-CM | POA: Diagnosis not present

## 2018-04-22 DIAGNOSIS — R928 Other abnormal and inconclusive findings on diagnostic imaging of breast: Secondary | ICD-10-CM | POA: Insufficient documentation

## 2018-04-22 DIAGNOSIS — N6002 Solitary cyst of left breast: Secondary | ICD-10-CM | POA: Diagnosis not present

## 2018-05-05 ENCOUNTER — Other Ambulatory Visit: Payer: Self-pay | Admitting: Family Medicine

## 2018-05-05 ENCOUNTER — Encounter: Payer: Self-pay | Admitting: Family Medicine

## 2018-05-05 MED ORDER — CLONAZEPAM 0.5 MG PO TABS: 0.5000 mg | ORAL_TABLET | Freq: Every day | ORAL | 0 refills | Status: DC | PRN

## 2018-05-22 DIAGNOSIS — Z6826 Body mass index (BMI) 26.0-26.9, adult: Secondary | ICD-10-CM | POA: Diagnosis not present

## 2018-05-22 DIAGNOSIS — M961 Postlaminectomy syndrome, not elsewhere classified: Secondary | ICD-10-CM | POA: Diagnosis not present

## 2018-05-22 DIAGNOSIS — M5136 Other intervertebral disc degeneration, lumbar region: Secondary | ICD-10-CM | POA: Diagnosis not present

## 2018-05-22 DIAGNOSIS — M25561 Pain in right knee: Secondary | ICD-10-CM | POA: Diagnosis not present

## 2018-06-03 ENCOUNTER — Other Ambulatory Visit: Payer: Self-pay | Admitting: Family Medicine

## 2018-06-04 MED ORDER — CLONAZEPAM 0.5 MG PO TABS: 0.5000 mg | ORAL_TABLET | Freq: Every day | ORAL | 0 refills | Status: DC | PRN

## 2018-07-14 ENCOUNTER — Encounter: Payer: Self-pay | Admitting: Family Medicine

## 2018-07-14 ENCOUNTER — Other Ambulatory Visit: Payer: Self-pay | Admitting: Family Medicine

## 2018-07-14 NOTE — Telephone Encounter (Signed)
Routing to provider  

## 2018-07-16 ENCOUNTER — Other Ambulatory Visit: Payer: Self-pay | Admitting: Family Medicine

## 2018-07-16 MED ORDER — CLONAZEPAM 0.5 MG PO TABS: 0.5000 mg | ORAL_TABLET | Freq: Every day | ORAL | 0 refills | Status: DC | PRN

## 2018-07-21 ENCOUNTER — Ambulatory Visit (INDEPENDENT_AMBULATORY_CARE_PROVIDER_SITE_OTHER): Payer: 59 | Admitting: Family Medicine

## 2018-07-21 ENCOUNTER — Encounter: Payer: Self-pay | Admitting: Family Medicine

## 2018-07-21 VITALS — BP 123/71 | HR 74 | Temp 98.3°F | Ht 66.3 in | Wt 165.1 lb

## 2018-07-21 DIAGNOSIS — G8929 Other chronic pain: Secondary | ICD-10-CM | POA: Diagnosis not present

## 2018-07-21 DIAGNOSIS — M5441 Lumbago with sciatica, right side: Secondary | ICD-10-CM | POA: Diagnosis not present

## 2018-07-21 DIAGNOSIS — G4700 Insomnia, unspecified: Secondary | ICD-10-CM | POA: Diagnosis not present

## 2018-07-21 DIAGNOSIS — M5442 Lumbago with sciatica, left side: Secondary | ICD-10-CM

## 2018-07-21 DIAGNOSIS — F419 Anxiety disorder, unspecified: Secondary | ICD-10-CM | POA: Diagnosis not present

## 2018-07-21 DIAGNOSIS — L309 Dermatitis, unspecified: Secondary | ICD-10-CM

## 2018-07-21 DIAGNOSIS — F331 Major depressive disorder, recurrent, moderate: Secondary | ICD-10-CM | POA: Diagnosis not present

## 2018-07-21 MED ORDER — BUSPIRONE HCL 10 MG PO TABS: 10.0000 mg | ORAL_TABLET | Freq: Three times a day (TID) | ORAL | 0 refills | Status: DC

## 2018-07-21 MED ORDER — SUVOREXANT 10 MG PO TABS: 10.0000 mg | ORAL_TABLET | Freq: Every evening | ORAL | 0 refills | Status: DC | PRN

## 2018-07-21 MED ORDER — CLONAZEPAM 0.5 MG PO TABS: 0.5000 mg | ORAL_TABLET | Freq: Every day | ORAL | 0 refills | Status: DC | PRN

## 2018-07-21 MED ORDER — CRISABOROLE 2 % EX OINT: 1.0000 "application " | TOPICAL_OINTMENT | Freq: Two times a day (BID) | CUTANEOUS | 6 refills | Status: DC | PRN

## 2018-07-21 MED ORDER — TRIAMCINOLONE ACETONIDE 0.1 % EX CREA: 1.0000 "application " | TOPICAL_CREAM | Freq: Two times a day (BID) | CUTANEOUS | 6 refills | Status: DC

## 2018-07-21 NOTE — Progress Notes (Signed)
BP 123/71 (BP Location: Left Arm, Patient Position: Sitting, Cuff Size: Normal)   Pulse 74   Temp 98.3 F (36.8 C)   Ht 5' 6.3" (1.684 m)   Wt 165 lb 2 oz (74.9 kg)   LMP 06/20/2016 (Approximate)   SpO2 100%   BMI 26.41 kg/m    Subjective:    Patient ID: Jamie [name], female    DOB: 05/20/1971, 47 y.o.   MRN: [id number]  HPI: Jamie [name] is a 47 y.o. female  Chief Complaint  Patient presents with  . Anxiety  . Depression   Trintellix was too costly. Taking 1/2 tab of seroquel on days she doesn't have to be at work early, taking 1/4 tab on days she has to be at work early. Also taking klonopin as needed. Not sleeping well both due to anxiety and her chronic back pain. Unable to get her back surgery until both of her parents have their own orthopedic surgeries. Has a significant amount of life stress right now with work issues, taking care of ill parents, and going through a divorce. Crying all the time, down and depressed, and having frequent panic attacks. Has tried multiple antidepressants in the past including wellbutrin, cymbalta, zoloft, and effexor without improvement.   Still struggling with significant eczema on b/l palms right worse than left. Using eucrisa faithfully. Steroid creams minimally beneficial in the past. Bleeding, peeling, very painful with hand washing. Using a sock and thick cream at bedtime and a good hand cream during the day.   Depression screen [facility] 2/9 07/21/2018 04/02/2018 02/06/2018  Decreased Interest 3 2 2   Down, Depressed, Hopeless 3 2 2   PHQ - 2 Score 6 4 4   Altered sleeping 3 3 3   Tired, decreased energy 3 3 2   Change in appetite 3 2 2   Feeling bad or failure about yourself  3 2 3   Trouble concentrating 3 2 2   Moving slowly or fidgety/restless 2 3 2   Suicidal thoughts 0 0 0  PHQ-9 Score 23 19 18   Difficult doing work/chores Extremely dIfficult - Very difficult   GAD 7 : Generalized Anxiety Score 07/21/2018 02/06/2018 08/21/2017  Nervous,  Anxious, on Edge 3 2 3   Control/stop worrying 3 2 3   Worry too much - different things 3 2 3   Trouble relaxing 3 3 3   Restless 3 3 3   Easily annoyed or irritable 3 1 3   Afraid - awful might happen 1 2 2   Total GAD 7 Score 19 15 20   Anxiety Difficulty Very difficult Somewhat difficult Somewhat difficult   Relevant past medical, surgical, family and social history reviewed and updated as indicated. Interim medical history since our last visit reviewed. Allergies and medications reviewed and updated.  Review of Systems  Per HPI unless specifically indicated above     Objective:    BP 123/71 (BP Location: Left Arm, Patient Position: Sitting, Cuff Size: Normal)   Pulse 74   Temp 98.3 F (36.8 C)   Ht 5' 6.3" (1.684 m)   Wt 165 lb 2 oz (74.9 kg)   LMP 06/20/2016 (Approximate)   SpO2 100%   BMI 26.41 kg/m   Wt Readings from Last 3 Encounters:  07/21/18 165 lb 2 oz (74.9 kg)  04/02/18 166 lb 4.8 oz (75.4 kg)  02/06/18 165 lb 2 oz (74.9 kg)    Physical Exam  Constitutional: She is oriented to person, place, and time. She appears well-developed and well-nourished.  HENT:  Head: Atraumatic.  Eyes: Pupils are  equal, round, and reactive to light. Conjunctivae and EOM are normal.  Neck: Normal range of motion. Neck supple.  Cardiovascular: Normal rate and regular rhythm.  Pulmonary/Chest: Effort normal and breath sounds normal.  Musculoskeletal: Normal range of motion.  Neurological: She is alert and oriented to person, place, and time.  Skin: Skin is warm and dry. Rash (right palm significantly dry and peeling, left less so) noted.  Psychiatric: Thought content normal.  Tearful throughout visit, appears very anxious and down  Nursing note and vitals reviewed.   Results for orders placed or performed in visit on 02/06/18  Microscopic Examination  Result Value Ref Range   WBC, UA 0-5 0 - 5 /hpf   RBC, UA 0-2 0 - 2 /hpf   Epithelial Cells (non renal) 0-10 0 - 10 /hpf    Bacteria, UA None seen None seen/Few  CBC with Differential/Platelet  Result Value Ref Range   WBC 7.0 3.4 - 10.8 x10E3/uL   RBC 4.38 3.77 - 5.28 x10E6/uL   Hemoglobin 13.8 11.1 - 15.9 g/dL   Hematocrit 42.4 34.0 - 46.6 %   MCV 97 79 - 97 fL   MCH 31.5 26.6 - 33.0 pg   MCHC 32.5 31.5 - 35.7 g/dL   RDW 13.5 12.3 - 15.4 %   Platelets 220 150 - 450 x10E3/uL   Neutrophils 47 Not Estab. %   Lymphs 45 Not Estab. %   Monocytes 7 Not Estab. %   Eos 1 Not Estab. %   Basos 0 Not Estab. %   Neutrophils Absolute 3.3 1.4 - 7.0 x10E3/uL   Lymphocytes Absolute 3.1 0.7 - 3.1 x10E3/uL   Monocytes Absolute 0.5 0.1 - 0.9 x10E3/uL   EOS (ABSOLUTE) 0.0 0.0 - 0.4 x10E3/uL   Basophils Absolute 0.0 0.0 - 0.2 x10E3/uL   Immature Granulocytes 0 Not Estab. %   Immature Grans (Abs) 0.0 0.0 - 0.1 x10E3/uL  Comprehensive metabolic panel  Result Value Ref Range   Glucose 94 65 - 99 mg/dL   BUN 10 6 - 24 mg/dL   Creatinine, Ser 0.77 0.57 - 1.00 mg/dL   GFR calc non Af Amer 92 >59 mL/min/1.73   GFR calc Af Amer 106 >59 mL/min/1.73   BUN/Creatinine Ratio 13 9 - 23   Sodium 141 134 - 144 mmol/L   Potassium 4.8 3.5 - 5.2 mmol/L   Chloride 105 96 - 106 mmol/L   CO2 23 20 - 29 mmol/L   Calcium 9.6 8.7 - 10.2 mg/dL   Total Protein 6.9 6.0 - 8.5 g/dL   Albumin 4.5 3.5 - 5.5 g/dL   Globulin, Total 2.4 1.5 - 4.5 g/dL   Albumin/Globulin Ratio 1.9 1.2 - 2.2   Bilirubin Total 0.3 0.0 - 1.2 mg/dL   Alkaline Phosphatase 74 39 - 117 IU/L   AST 15 0 - 40 IU/L   ALT 16 0 - 32 IU/L  Lipid Panel w/o Chol/HDL Ratio  Result Value Ref Range   Cholesterol, Total 171 100 - 199 mg/dL   Triglycerides 65 0 - 149 mg/dL   HDL 51 >39 mg/dL   VLDL Cholesterol Cal 13 5 - 40 mg/dL   LDL Calculated 107 (H) 0 - 99 mg/dL  TSH  Result Value Ref Range   TSH 1.290 0.450 - 4.500 uIU/mL  UA/M w/rflx Culture, Routine  Result Value Ref Range   Specific Gravity, UA 1.010 1.005 - 1.030   pH, UA 5.5 5.0 - 7.5   Color, UA Yellow Yellow  Appearance Ur Clear Clear   Leukocytes, UA Negative Negative   Protein, UA Negative Negative/Trace   Glucose, UA Negative Negative   Ketones, UA Negative Negative   RBC, UA Trace (A) Negative   Bilirubin, UA Negative Negative   Urobilinogen, Ur 0.2 0.2 - 1.0 mg/dL   Nitrite, UA Negative Negative   Microscopic Examination See below:       Assessment & Plan:   Problem List Items Addressed This Visit      Musculoskeletal and Integument   Eczema    Eucrisa not working great for her despite faithful use. Will add back triamcinolone in addition for better coverage. Continue good moisturizing cream        Other   Depression    Start buspar and monitor closely, continue other regimen as it is. Psychiatry referral placed for further management and counseling recommendations      Relevant Medications   busPIRone (BUSPAR) 10 MG tablet   Anxiety - Primary    Will start buspar 10 mg TID and monitor closely for benefit. Continue seroquel at bedtime and klonopin prn. Referral to psychiatry placed today. Pt at a place where she's ready to start counseling in addition to her medications      Relevant Medications   busPIRone (BUSPAR) 10 MG tablet   Other Relevant Orders   Ambulatory referral to Psychiatry   Chronic back pain    Awaiting surgery in the next 6 or so months. Continue current medications      Insomnia    Will trial belsomra for her significant sleep issues. Discussed taking without the bedtime seroquel for the first night and then adding 1/4 tab if needed, titrating up until good benefit.           Follow up plan: Return in about 4 weeks (around 08/18/2018) for Anxiety, insomnia f/u.

## 2018-07-22 ENCOUNTER — Telehealth: Payer: Self-pay

## 2018-07-22 NOTE — Assessment & Plan Note (Signed)
Eucrisa not working great for her despite faithful use. Will add back triamcinolone in addition for better coverage. Continue good moisturizing cream

## 2018-07-22 NOTE — Assessment & Plan Note (Signed)
Will start buspar 10 mg TID and monitor closely for benefit. Continue seroquel at bedtime and klonopin prn. Referral to psychiatry placed today. Pt at a place where she's ready to start counseling in addition to her medications

## 2018-07-22 NOTE — Assessment & Plan Note (Signed)
Start buspar and monitor closely, continue other regimen as it is. Psychiatry referral placed for further management and counseling recommendations

## 2018-07-22 NOTE — Assessment & Plan Note (Signed)
Will trial belsomra for her significant sleep issues. Discussed taking without the bedtime seroquel for the first night and then adding 1/4 tab if needed, titrating up until good benefit.

## 2018-07-22 NOTE — Patient Instructions (Signed)
Follow up in 1 month   

## 2018-07-22 NOTE — Telephone Encounter (Signed)
Prior authorization for Belsomra was initiated via [email or web address]. Key: [id number]

## 2018-07-22 NOTE — Assessment & Plan Note (Signed)
Awaiting surgery in the next 6 or so months. Continue current medications

## 2018-07-25 ENCOUNTER — Telehealth: Payer: Self-pay | Admitting: Family Medicine

## 2018-07-25 MED ORDER — ZOLPIDEM TARTRATE 5 MG PO TABS: 5.0000 mg | ORAL_TABLET | Freq: Every evening | ORAL | 0 refills | Status: DC | PRN

## 2018-07-25 NOTE — Telephone Encounter (Signed)
Received insurance denial, they want her to try [place] first - sent over [place] for her to take as needed. If having any issues with it let us know and we can re-try sending over belsomra. Caution on sedation and vivid dreams risks

## 2018-07-25 NOTE — Telephone Encounter (Signed)
Patient notified of Rachel's message. Patient states that she does not want to try Ambien because she is terrified of the side effects. Patient wanted to let Apolonio Schneiders know this and see what she suggests. I told the patient that Apolonio Schneiders was already out of the office and that we would get back with her Monday. Patient states that is fine.

## 2018-07-28 NOTE — Telephone Encounter (Signed)
Called and left VM to return call to discuss sleep medication

## 2018-08-21 DIAGNOSIS — Z6826 Body mass index (BMI) 26.0-26.9, adult: Secondary | ICD-10-CM | POA: Diagnosis not present

## 2018-08-21 DIAGNOSIS — M961 Postlaminectomy syndrome, not elsewhere classified: Secondary | ICD-10-CM | POA: Diagnosis not present

## 2018-08-21 DIAGNOSIS — R030 Elevated blood-pressure reading, without diagnosis of hypertension: Secondary | ICD-10-CM | POA: Diagnosis not present

## 2018-08-21 MED FILL — oxyCODONE HCL 15 MG TABS: 15 | 30 days supply | Qty: 120 | Fill #0

## 2018-08-28 DIAGNOSIS — Z6826 Body mass index (BMI) 26.0-26.9, adult: Secondary | ICD-10-CM | POA: Diagnosis not present

## 2018-08-28 DIAGNOSIS — R030 Elevated blood-pressure reading, without diagnosis of hypertension: Secondary | ICD-10-CM | POA: Diagnosis not present

## 2018-08-28 DIAGNOSIS — M5440 Lumbago with sciatica, unspecified side: Secondary | ICD-10-CM | POA: Diagnosis not present

## 2018-08-28 DIAGNOSIS — S32009K Unspecified fracture of unspecified lumbar vertebra, subsequent encounter for fracture with nonunion: Secondary | ICD-10-CM | POA: Diagnosis not present

## 2018-09-03 ENCOUNTER — Other Ambulatory Visit: Payer: Self-pay | Admitting: Family Medicine

## 2018-09-03 MED ORDER — CLONAZEPAM 0.5 MG PO TABS: 0.5000 mg | ORAL_TABLET | Freq: Every day | ORAL | 0 refills | Status: DC | PRN

## 2018-09-08 ENCOUNTER — Other Ambulatory Visit: Payer: Self-pay | Admitting: Neurosurgery

## 2018-09-08 DIAGNOSIS — S32009K Unspecified fracture of unspecified lumbar vertebra, subsequent encounter for fracture with nonunion: Secondary | ICD-10-CM

## 2018-09-12 ENCOUNTER — Other Ambulatory Visit: Payer: 59

## 2018-09-15 ENCOUNTER — Other Ambulatory Visit: Payer: Self-pay | Admitting: Family Medicine

## 2018-09-16 MED ORDER — BUSPIRONE HCL 10 MG PO TABS: 10.0000 mg | ORAL_TABLET | Freq: Three times a day (TID) | ORAL | 0 refills | Status: DC

## 2018-09-16 NOTE — Telephone Encounter (Signed)
Pt was due for f/u a month ago and did not come. I see she's scheduled for 2/7, we can discuss the ambien at that visit.

## 2018-09-26 ENCOUNTER — Ambulatory Visit: Payer: 59 | Admitting: Family Medicine

## 2018-09-26 ENCOUNTER — Encounter: Payer: Self-pay | Admitting: Family Medicine

## 2018-09-26 VITALS — BP 116/76 | HR 84 | Temp 99.1°F | Wt 169.5 lb

## 2018-09-26 DIAGNOSIS — F419 Anxiety disorder, unspecified: Secondary | ICD-10-CM

## 2018-09-26 DIAGNOSIS — G4700 Insomnia, unspecified: Secondary | ICD-10-CM

## 2018-09-26 MED ORDER — BUSPIRONE HCL 15 MG PO TABS: 15.0000 mg | ORAL_TABLET | Freq: Three times a day (TID) | ORAL | 1 refills | Status: DC

## 2018-09-26 MED ORDER — QUETIAPINE FUMARATE 50 MG PO TABS: 50.0000 mg | ORAL_TABLET | Freq: Every day | ORAL | 1 refills | Status: DC

## 2018-09-26 NOTE — Progress Notes (Signed)
BP 116/76   Pulse 84   Temp 99.1 F (37.3 C) (Oral)   Wt 169 lb 8 oz (76.9 kg)   LMP 06/20/2016 (Approximate)   SpO2 98%   BMI 27.11 kg/m    Subjective:    Patient ID: Jamie [name], female    DOB: [birth year], 48 y.o.   MRN: [id number]  HPI: Jamie [name] is a 48 y.o. female  Chief Complaint  Patient presents with  . Anxiety  . Insomnia   Here today for anxiety and sleep f/u.  Had to delay Psychiatry establish visit as she just lost her job, which is a major added stress on top of caregiving for sick parents, a nasty divorce, and dealing with her own health issues. Currently interviewing for other positions.  Very happy about how the buspar is going but hoping to increase some. No major side effects, SI/HI.   Using the [place] rarely as needed, still has plenty left. 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 Still using the seroquel at bedtime as well, typically a half or quarter tab.   Depression screen [facility] 2/9 09/26/2018 07/21/2018 04/02/2018  Decreased Interest - 3 2  Down, Depressed, Hopeless - 3 2  PHQ - 2 Score - 6 4  Altered sleeping - 3 3  Tired, decreased energy - 3 3  Change in appetite - 3 2  Feeling bad or failure about yourself  - 3 2  Trouble concentrating - 3 2  Moving slowly or fidgety/restless - 2 3  Suicidal thoughts - 0 0  PHQ-9 Score - 23 19  Difficult doing work/chores (No Data) Extremely dIfficult -   GAD 7 : Generalized Anxiety Score 07/21/2018 02/06/2018 08/21/2017  Nervous, Anxious, on Edge 3 2 3   Control/stop worrying 3 2 3   Worry too much - different things 3 2 3   Trouble relaxing 3 3 3   Restless 3 3 3   Easily annoyed or irritable 3 1 3   Afraid - awful might happen 1 2 2   Total GAD 7 Score 19 15 20   Anxiety Difficulty Very difficult Somewhat difficult Somewhat difficult     Relevant past medical, surgical, family and social history reviewed and updated as indicated. Interim  medical history since our last visit reviewed. Allergies and medications reviewed and updated.  Review of Systems  Per HPI unless specifically indicated above     Objective:    BP 116/76   Pulse 84   Temp 99.1 F (37.3 C) (Oral)   Wt 169 lb 8 oz (76.9 kg)   LMP 06/20/2016 (Approximate)   SpO2 98%   BMI 27.11 kg/m   Wt Readings from Last 3 Encounters:  09/26/18 169 lb 8 oz (76.9 kg)  07/21/18 165 lb 2 oz (74.9 kg)  04/02/18 166 lb 4.8 oz (75.4 kg)    Physical Exam Vitals signs and nursing note reviewed.  Constitutional:      Appearance: Normal appearance. She is not ill-appearing.  HENT:     Head: Atraumatic.  Eyes:     Extraocular Movements: Extraocular movements intact.     Conjunctiva/sclera: Conjunctivae normal.  Neck:     Musculoskeletal: Normal range of motion and neck supple.  Cardiovascular:     Rate and Rhythm: Normal rate and regular rhythm.     Heart sounds: Normal heart sounds.  Pulmonary:     Effort: Pulmonary effort is normal.     Breath sounds:  Normal breath sounds.  Musculoskeletal: Normal range of motion.  Skin:    General: Skin is warm and dry.  Neurological:     Mental Status: She is alert and oriented to person, place, and time.  Psychiatric:        Thought Content: Thought content normal.        Judgment: Judgment normal.     Comments: tearful     Results for orders placed or performed in visit on 02/06/18  Microscopic Examination  Result Value Ref Range   WBC, UA 0-5 0 - 5 /hpf   RBC, UA 0-2 0 - 2 /hpf   Epithelial Cells (non renal) 0-10 0 - 10 /hpf   Bacteria, UA None seen None seen/Few  CBC with Differential/Platelet  Result Value Ref Range   WBC 7.0 3.4 - 10.8 x10E3/uL   RBC 4.38 3.77 - 5.28 x10E6/uL   Hemoglobin 13.8 11.1 - 15.9 g/dL   Hematocrit 42.4 34.0 - 46.6 %   MCV 97 79 - 97 fL   MCH 31.5 26.6 - 33.0 pg   MCHC 32.5 31.5 - 35.7 g/dL   RDW 13.5 12.3 - 15.4 %   Platelets 220 150 - 450 x10E3/uL   Neutrophils 47 Not  Estab. %   Lymphs 45 Not Estab. %   Monocytes 7 Not Estab. %   Eos 1 Not Estab. %   Basos 0 Not Estab. %   Neutrophils Absolute 3.3 1.4 - 7.0 x10E3/uL   Lymphocytes Absolute 3.1 0.7 - 3.1 x10E3/uL   Monocytes Absolute 0.5 0.1 - 0.9 x10E3/uL   EOS (ABSOLUTE) 0.0 0.0 - 0.4 x10E3/uL   Basophils Absolute 0.0 0.0 - 0.2 x10E3/uL   Immature Granulocytes 0 Not Estab. %   Immature Grans (Abs) 0.0 0.0 - 0.1 x10E3/uL  Comprehensive metabolic panel  Result Value Ref Range   Glucose 94 65 - 99 mg/dL   BUN 10 6 - 24 mg/dL   Creatinine, Ser 0.77 0.57 - 1.00 mg/dL   GFR calc non Af Amer 92 >59 mL/min/1.73   GFR calc Af Amer 106 >59 mL/min/1.73   BUN/Creatinine Ratio 13 9 - 23   Sodium 141 134 - 144 mmol/L   Potassium 4.8 3.5 - 5.2 mmol/L   Chloride 105 96 - 106 mmol/L   CO2 23 20 - 29 mmol/L   Calcium 9.6 8.7 - 10.2 mg/dL   Total Protein 6.9 6.0 - 8.5 g/dL   Albumin 4.5 3.5 - 5.5 g/dL   Globulin, Total 2.4 1.5 - 4.5 g/dL   Albumin/Globulin Ratio 1.9 1.2 - 2.2   Bilirubin Total 0.3 0.0 - 1.2 mg/dL   Alkaline Phosphatase 74 39 - 117 IU/L   AST 15 0 - 40 IU/L   ALT 16 0 - 32 IU/L  Lipid Panel w/o Chol/HDL Ratio  Result Value Ref Range   Cholesterol, Total 171 100 - 199 mg/dL   Triglycerides 65 0 - 149 mg/dL   HDL 51 >39 mg/dL   VLDL Cholesterol Cal 13 5 - 40 mg/dL   LDL Calculated 107 (H) 0 - 99 mg/dL  TSH  Result Value Ref Range   TSH 1.290 0.450 - 4.500 uIU/mL  UA/M w/rflx Culture, Routine  Result Value Ref Range   Specific Gravity, UA 1.010 1.005 - 1.030   pH, UA 5.5 5.0 - 7.5   Color, UA Yellow Yellow   Appearance Ur Clear Clear   Leukocytes, UA Negative Negative   Protein, UA Negative Negative/Trace   Glucose, UA  Negative Negative   Ketones, UA Negative Negative   RBC, UA Trace (A) Negative   Bilirubin, UA Negative Negative   Urobilinogen, Ur 0.2 0.2 - 1.0 mg/dL   Nitrite, UA Negative Negative   Microscopic Examination See below:       Assessment & Plan:   Problem List  Items Addressed This Visit      Other   Anxiety - Primary    Notes great improvement with addition of buspar. Increase dose to 15 mg TID and monitor for benefit. Will est with Psychiatry as soon as her job situation resolves      Relevant Medications   busPIRone (BUSPAR) 15 MG tablet   Insomnia    Doing very well on prn ambien and nightly seroquel. Continue current regimen          Follow up plan: Return in about 6 weeks (around 11/07/2018) for Anxiety.

## 2018-09-30 NOTE — Assessment & Plan Note (Signed)
Doing very well on prn ambien and nightly seroquel. Continue current regimen

## 2018-09-30 NOTE — Assessment & Plan Note (Signed)
Notes great improvement with addition of buspar. Increase dose to 15 mg TID and monitor for benefit. Will est with Psychiatry as soon as her job situation resolves

## 2018-10-01 ENCOUNTER — Other Ambulatory Visit: Payer: Self-pay | Admitting: Family Medicine

## 2018-10-01 MED ORDER — CLONAZEPAM 0.5 MG PO TABS: 0.5000 mg | ORAL_TABLET | Freq: Every day | ORAL | 0 refills | Status: DC | PRN

## 2018-10-16 ENCOUNTER — Other Ambulatory Visit: Payer: Self-pay

## 2018-10-16 ENCOUNTER — Encounter: Payer: Self-pay | Admitting: Family Medicine

## 2018-10-16 ENCOUNTER — Ambulatory Visit: Payer: 59 | Admitting: Family Medicine

## 2018-10-16 VITALS — BP 94/67 | HR 82 | Temp 98.2°F | Ht 66.5 in | Wt 167.0 lb

## 2018-10-16 DIAGNOSIS — J111 Influenza due to unidentified influenza virus with other respiratory manifestations: Secondary | ICD-10-CM | POA: Diagnosis not present

## 2018-10-16 DIAGNOSIS — R52 Pain, unspecified: Secondary | ICD-10-CM | POA: Diagnosis not present

## 2018-10-16 MED ORDER — BALOXAVIR MARBOXIL(80 MG DOSE) 2 X 40 MG PO TBPK: 40.0000 mg | ORAL_TABLET | Freq: Once | ORAL | 0 refills | Status: AC

## 2018-10-16 MED ORDER — PROMETHAZINE-DM 6.25-15 MG/5ML PO SYRP: 5.0000 mL | ORAL_SOLUTION | Freq: Four times a day (QID) | ORAL | 0 refills | Status: DC | PRN

## 2018-10-16 MED ORDER — BALOXAVIR MARBOXIL(40 MG DOSE) 2 X 20 MG PO TBPK: 40.0000 mg | ORAL_TABLET | Freq: Once | ORAL | 0 refills | Status: DC

## 2018-10-16 NOTE — Progress Notes (Signed)
BP 94/67   Pulse 82   Temp 98.2 F (36.8 C) (Oral)   Ht 5' 6.5" (1.689 m)   Wt 167 lb (75.8 kg)   LMP 06/20/2016 (Approximate)   SpO2 99%   BMI 26.55 kg/m    Subjective:    Patient ID: Jamie [name], female    DOB: 04-10-1971, 48 y.o.   MRN: [id number]  HPI: Jamie [name] is a 48 y.o. female  Chief Complaint  Patient presents with  . Cough    since yesterday. tried OTC mucinex. left over Rx tessalon  . Generalized Body Aches  . Sore Throat  . Ear Pain    bilateral   Severe cough, body aches, fever, sore throat, b/l ear pain, N/V x 1 day. Denies diarrhea, CP, SOB. Taking OTC cold and flu medicines without relief. Lots of sick contacts. No hx of allergies or pulmonary dz. Non smoker.   Relevant past medical, surgical, family and social history reviewed and updated as indicated. Interim medical history since our last visit reviewed. Allergies and medications reviewed and updated.  Review of Systems  Per HPI unless specifically indicated above     Objective:    BP 94/67   Pulse 82   Temp 98.2 F (36.8 C) (Oral)   Ht 5' 6.5" (1.689 m)   Wt 167 lb (75.8 kg)   LMP 06/20/2016 (Approximate)   SpO2 99%   BMI 26.55 kg/m   Wt Readings from Last 3 Encounters:  10/16/18 167 lb (75.8 kg)  09/26/18 169 lb 8 oz (76.9 kg)  07/21/18 165 lb 2 oz (74.9 kg)    Physical Exam Vitals signs and nursing note reviewed.  Constitutional:      Appearance: Normal appearance. She is ill-appearing.  HENT:     Head: Atraumatic.     Right Ear: Tympanic membrane and external ear normal.     Left Ear: Tympanic membrane and external ear normal.     Nose: Rhinorrhea present.     Mouth/Throat:     Mouth: Mucous membranes are moist.     Pharynx: Posterior oropharyngeal erythema present.  Eyes:     Extraocular Movements: Extraocular movements intact.     Conjunctiva/sclera: Conjunctivae normal.  Neck:     Musculoskeletal: Normal range of motion and neck supple.  Cardiovascular:   Rate and Rhythm: Normal rate and regular rhythm.     Heart sounds: Normal heart sounds.  Pulmonary:     Effort: Pulmonary effort is normal.     Breath sounds: Normal breath sounds. No wheezing.  Musculoskeletal: Normal range of motion.  Skin:    General: Skin is warm and dry.  Neurological:     Mental Status: She is alert and oriented to person, place, and time.  Psychiatric:        Mood and Affect: Mood normal.        Thought Content: Thought content normal.     Results for orders placed or performed in visit on 02/06/18  Microscopic Examination  Result Value Ref Range   WBC, UA 0-5 0 - 5 /hpf   RBC, UA 0-2 0 - 2 /hpf   Epithelial Cells (non renal) 0-10 0 - 10 /hpf   Bacteria, UA None seen None seen/Few  CBC with Differential/Platelet  Result Value Ref Range   WBC 7.0 3.4 - 10.8 x10E3/uL   RBC 4.38 3.77 - 5.28 x10E6/uL   Hemoglobin 13.8 11.1 - 15.9 g/dL   Hematocrit 42.4 34.0 - 46.6 %   MCV  97 79 - 97 fL   MCH 31.5 26.6 - 33.0 pg   MCHC 32.5 31.5 - 35.7 g/dL   RDW 13.5 12.3 - 15.4 %   Platelets 220 150 - 450 x10E3/uL   Neutrophils 47 Not Estab. %   Lymphs 45 Not Estab. %   Monocytes 7 Not Estab. %   Eos 1 Not Estab. %   Basos 0 Not Estab. %   Neutrophils Absolute 3.3 1.4 - 7.0 x10E3/uL   Lymphocytes Absolute 3.1 0.7 - 3.1 x10E3/uL   Monocytes Absolute 0.5 0.1 - 0.9 x10E3/uL   EOS (ABSOLUTE) 0.0 0.0 - 0.4 x10E3/uL   Basophils Absolute 0.0 0.0 - 0.2 x10E3/uL   Immature Granulocytes 0 Not Estab. %   Immature Grans (Abs) 0.0 0.0 - 0.1 x10E3/uL  Comprehensive metabolic panel  Result Value Ref Range   Glucose 94 65 - 99 mg/dL   BUN 10 6 - 24 mg/dL   Creatinine, Ser 0.77 0.57 - 1.00 mg/dL   GFR calc non Af Amer 92 >59 mL/min/1.73   GFR calc Af Amer 106 >59 mL/min/1.73   BUN/Creatinine Ratio 13 9 - 23   Sodium 141 134 - 144 mmol/L   Potassium 4.8 3.5 - 5.2 mmol/L   Chloride 105 96 - 106 mmol/L   CO2 23 20 - 29 mmol/L   Calcium 9.6 8.7 - 10.2 mg/dL   Total Protein  6.9 6.0 - 8.5 g/dL   Albumin 4.5 3.5 - 5.5 g/dL   Globulin, Total 2.4 1.5 - 4.5 g/dL   Albumin/Globulin Ratio 1.9 1.2 - 2.2   Bilirubin Total 0.3 0.0 - 1.2 mg/dL   Alkaline Phosphatase 74 39 - 117 IU/L   AST 15 0 - 40 IU/L   ALT 16 0 - 32 IU/L  Lipid Panel w/o Chol/HDL Ratio  Result Value Ref Range   Cholesterol, Total 171 100 - 199 mg/dL   Triglycerides 65 0 - 149 mg/dL   HDL 51 >39 mg/dL   VLDL Cholesterol Cal 13 5 - 40 mg/dL   LDL Calculated 107 (H) 0 - 99 mg/dL  TSH  Result Value Ref Range   TSH 1.290 0.450 - 4.500 uIU/mL  UA/M w/rflx Culture, Routine  Result Value Ref Range   Specific Gravity, UA 1.010 1.005 - 1.030   pH, UA 5.5 5.0 - 7.5   Color, UA Yellow Yellow   Appearance Ur Clear Clear   Leukocytes, UA Negative Negative   Protein, UA Negative Negative/Trace   Glucose, UA Negative Negative   Ketones, UA Negative Negative   RBC, UA Trace (A) Negative   Bilirubin, UA Negative Negative   Urobilinogen, Ur 0.2 0.2 - 1.0 mg/dL   Nitrite, UA Negative Negative   Microscopic Examination See below:       Assessment & Plan:   Problem List Items Addressed This Visit    None    Visit Diagnoses    Influenza    -  Primary   Rapid flu neg but sxs consistent. Tx with xofluza, phenergan DM, OTC remedies and supportive care. Return precautions reviewed   Relevant Medications   Baloxavir Marboxil,80 MG Dose, (XOFLUZA) 2 x 40 MG TBPK   Other Relevant Orders   Veritor Flu A/B Waived       Follow up plan: Return if symptoms worsen or fail to improve.

## 2018-10-17 LAB — VERITOR FLU A/B WAIVED
Influenza A: NEGATIVE
Influenza B: NEGATIVE

## 2018-11-07 ENCOUNTER — Other Ambulatory Visit: Payer: Self-pay | Admitting: Family Medicine

## 2018-11-07 MED ORDER — CLONAZEPAM 0.5 MG PO TABS: 0.5000 mg | ORAL_TABLET | Freq: Every day | ORAL | 0 refills | Status: DC | PRN

## 2018-11-18 MED FILL — oxyCODONE HCL 15 MG TABS: 15 | 30 days supply | Qty: 120 | Fill #0

## 2018-11-24 DIAGNOSIS — M961 Postlaminectomy syndrome, not elsewhere classified: Secondary | ICD-10-CM | POA: Diagnosis not present

## 2018-11-24 MED FILL — MELOXICAM 7.5 MG TABLET: 7.5 | 30 days supply | Qty: 60 | Fill #0

## 2018-11-24 MED FILL — METAXALONE 800 MG TABLET: 800 | 30 days supply | Qty: 90 | Fill #0

## 2018-11-24 MED FILL — GABAPENTIN 600 MG TABLET: 600 | 30 days supply | Qty: 90 | Fill #0

## 2018-12-06 ENCOUNTER — Other Ambulatory Visit: Payer: Self-pay

## 2018-12-08 ENCOUNTER — Other Ambulatory Visit: Payer: Self-pay | Admitting: Nurse Practitioner

## 2018-12-08 MED ORDER — CLONAZEPAM 0.5 MG PO TABS: 0.5000 mg | ORAL_TABLET | Freq: Every day | ORAL | 0 refills | Status: DC | PRN

## 2018-12-08 NOTE — Progress Notes (Signed)
Last seen February 2020 for anxiety.  For further refills on this medication she will need to see her primary care provider.

## 2018-12-08 NOTE — Telephone Encounter (Signed)
Klonopin refill sent for further refills she will need to see her primary care provider in office.  Last visit for anxiety February 2020.

## 2018-12-09 ENCOUNTER — Telehealth: Payer: Self-pay | Admitting: Family Medicine

## 2018-12-09 NOTE — Telephone Encounter (Signed)
Copied from [place] [id number]. Topic: Quick Communication - Rx Refill/Question >> Dec 09, 2018 12:10 PM Virl Axe D wrote: Medication: clonazePAM (KLONOPIN) 0.5 MG tablet / Pt stated that she contacted [facility] pharmacy where rx was sent and they did not have rx. She is asking if rx can be resent to [facility] where she stated she originally asked for it to go. Please advise pt once this is done. Okay to leave secure VM. [phone]   Has the patient contacted their pharmacy? Yes.   (Agent: If no, request that the patient contact the pharmacy for the refill.) (Agent: If yes, when and what did the pharmacy advise?)  Preferred Pharmacy (with phone number or street name): [place], [place] - [address] [phone] (Phone) [phone] (Fax)    Agent: Please be advised that RX refills may take up to 3 business days. We ask that you follow-up with your pharmacy.

## 2018-12-10 MED ORDER — CLONAZEPAM 0.5 MG PO TABS: 0.5000 mg | ORAL_TABLET | Freq: Every day | ORAL | 1 refills | Status: DC | PRN

## 2018-12-10 MED FILL — clonazePAM 0.5 MG TABS: 0.5 | 30 days supply | Qty: 30 | Fill #0

## 2018-12-11 MED FILL — MORPHINE SULFATE ER 30 MG C: 30 | 30 days supply | Qty: 30 | Fill #0

## 2018-12-18 MED FILL — oxyCODONE HCL 15 MG TABS: 15 | 30 days supply | Qty: 120 | Fill #0

## 2019-01-08 MED FILL — METAXALONE 800 MG TABLET: 800 | 30 days supply | Qty: 90 | Fill #1

## 2019-01-08 MED FILL — clonazePAM 0.5 MG TABS: 0.5 | 30 days supply | Qty: 30 | Fill #1

## 2019-01-08 MED FILL — GABAPENTIN 600 MG TABLET: 600 | 30 days supply | Qty: 90 | Fill #1

## 2019-01-09 MED FILL — MORPHINE SULFATE ER 30 MG C: 30 | 30 days supply | Qty: 30 | Fill #0

## 2019-01-16 MED FILL — oxyCODONE HCL 15 MG TABS: 15 | 30 days supply | Qty: 120 | Fill #0

## 2019-01-27 ENCOUNTER — Ambulatory Visit: Payer: Self-pay | Admitting: Family Medicine

## 2019-01-27 ENCOUNTER — Telehealth: Payer: Self-pay | Admitting: Family Medicine

## 2019-01-27 NOTE — Telephone Encounter (Signed)
Pt has an appt scheduled for Monday, however she states she runs out of her buspar on sat. Pt would like refill sent to [facility]. Please advise.

## 2019-01-28 DIAGNOSIS — M5440 Lumbago with sciatica, unspecified side: Secondary | ICD-10-CM | POA: Diagnosis not present

## 2019-01-28 DIAGNOSIS — M961 Postlaminectomy syndrome, not elsewhere classified: Secondary | ICD-10-CM | POA: Diagnosis not present

## 2019-01-28 DIAGNOSIS — M5136 Other intervertebral disc degeneration, lumbar region: Secondary | ICD-10-CM | POA: Diagnosis not present

## 2019-01-28 MED ORDER — BUSPIRONE HCL 15 MG PO TABS: 15.0000 mg | ORAL_TABLET | Freq: Three times a day (TID) | ORAL | 1 refills | Status: DC

## 2019-01-28 NOTE — Addendum Note (Signed)
Addended by: Golden Pop A on: 01/28/2019 01:32 PM   Modules accepted: Orders

## 2019-02-02 ENCOUNTER — Telehealth: Payer: Self-pay | Admitting: Family Medicine

## 2019-02-02 ENCOUNTER — Other Ambulatory Visit: Payer: Self-pay

## 2019-02-02 ENCOUNTER — Encounter: Payer: Self-pay | Admitting: Family Medicine

## 2019-02-02 ENCOUNTER — Ambulatory Visit (INDEPENDENT_AMBULATORY_CARE_PROVIDER_SITE_OTHER): Payer: 59 | Admitting: Family Medicine

## 2019-02-02 DIAGNOSIS — M48061 Spinal stenosis, lumbar region without neurogenic claudication: Secondary | ICD-10-CM | POA: Diagnosis not present

## 2019-02-02 DIAGNOSIS — F419 Anxiety disorder, unspecified: Secondary | ICD-10-CM | POA: Diagnosis not present

## 2019-02-02 DIAGNOSIS — F339 Major depressive disorder, recurrent, unspecified: Secondary | ICD-10-CM | POA: Diagnosis not present

## 2019-02-02 MED ORDER — ARIPIPRAZOLE 10 MG PO TABS: 10.0000 mg | ORAL_TABLET | Freq: Every day | ORAL | 1 refills | Status: DC

## 2019-02-02 MED ORDER — CLONAZEPAM 0.5 MG PO TABS: 0.5000 mg | ORAL_TABLET | Freq: Two times a day (BID) | ORAL | 1 refills | Status: DC

## 2019-02-02 MED FILL — ARIPIPRAZOLE 10 MG TABS: 10 | 30 days supply | Qty: 30 | Fill #0

## 2019-02-02 NOTE — Progress Notes (Signed)
   Ht 5\' 7"  (1.702 m)   LMP 06/20/2016 (Approximate)   BMI 26.16 kg/m    Subjective:    Patient ID: Jamie [name], female    DOB: [birth year], 48 y.o.   MRN: [id number]  HPI: Jamie [name] is a 48 y.o. female  Chief Complaint  Patient presents with  . Anxiety    medication refill  . Depression  . Eczema    eucrisa refill  Discussed with patient great deal of anxiety with parents both in the hospital recently, divorce and commuting out of town for work along with other multiple other stressors.  Patient afraid to take Seroquel because does not want to be called in the middle of the night and cannot wake up or function.  Is taking clonazepam wants a refill and we will to take it twice a day.  Relevant past medical, surgical, family and social history reviewed and updated as indicated. Interim medical history since our last visit reviewed. Allergies and medications reviewed and updated.  Review of Systems  Constitutional: Negative.   Respiratory: Negative.   Cardiovascular: Negative.     Per HPI unless specifically indicated above     Objective:    Ht 5\' 7"  (1.702 m)   LMP 06/20/2016 (Approximate)   BMI 26.16 kg/m   Wt Readings from Last 3 Encounters:  10/16/18 167 lb (75.8 kg)  09/26/18 169 lb 8 oz (76.9 kg)  07/21/18 165 lb 2 oz (74.9 kg)    Physical Exam  Results for orders placed or performed in visit on 10/16/18  Veritor Flu A/B Waived  Result Value Ref Range   Influenza A Negative Negative   Influenza B Negative Negative      Assessment & Plan:   Problem List Items Addressed This Visit      Other   Spinal stenosis of lumbar region    Managed by pain clinic      Depression, recurrent ([place])    Discussed recurrent worsening depression will discontinue Seroquel start Abilify 10 mg 1 a day Okayed and refilled clonazepam 0.51 tablet twice a day. Recheck 1 week      Anxiety    Increase clonazepam temporarily          Follow up plan: Return in  about 1 week (around 02/09/2019).

## 2019-02-02 NOTE — Telephone Encounter (Signed)
Copied from [place] [id number]. Topic: Quick Communication - See Telephone Encounter >> Feb 02, 2019  3:59 PM Nils Flack wrote: CRM for notification. See Telephone encounter for: 02/02/19. Pt was put on abilify and needs to know if she is supposed to stop taking buspar.  Cb is [phone]

## 2019-02-02 NOTE — Assessment & Plan Note (Signed)
Increase clonazepam temporarily

## 2019-02-02 NOTE — Telephone Encounter (Signed)
Patient notified and verbalized understanding. 

## 2019-02-02 NOTE — Telephone Encounter (Signed)
Call pt Continue BuSpar

## 2019-02-02 NOTE — Assessment & Plan Note (Signed)
Managed by pain clinic 

## 2019-02-02 NOTE — Assessment & Plan Note (Signed)
Discussed recurrent worsening depression will discontinue Seroquel start Abilify 10 mg 1 a day Okayed and refilled clonazepam 0.51 tablet twice a day. Recheck 1 week

## 2019-02-04 MED FILL — clonazePAM 0.5 MG TABS: 0.5 | 30 days supply | Qty: 60 | Fill #0

## 2019-02-10 MED FILL — MORPHINE SULFATE ER 30 MG C: 30 | 30 days supply | Qty: 30 | Fill #0

## 2019-02-14 MED FILL — oxyCODONE HCL 15 MG TABS: 15 | 30 days supply | Qty: 120 | Fill #0

## 2019-03-05 MED FILL — clonazePAM 0.5 MG TABS: 0.5 | 30 days supply | Qty: 60 | Fill #1

## 2019-03-05 MED FILL — METAXALONE 800 MG TABS: 800 | 30 days supply | Qty: 90 | Fill #2

## 2019-03-11 MED FILL — MORPHINE SULF ER 30 MG TAB: 30 | 30 days supply | Qty: 30 | Fill #0

## 2019-03-16 MED FILL — oxyCODONE HCL 15 MG TABS: 15 | 30 days supply | Qty: 120 | Fill #0

## 2019-03-31 ENCOUNTER — Encounter: Payer: Self-pay | Admitting: Family Medicine

## 2019-03-31 ENCOUNTER — Other Ambulatory Visit: Payer: Self-pay | Admitting: Family Medicine

## 2019-03-31 MED ORDER — CLONAZEPAM 0.5 MG PO TABS: 0.5000 mg | ORAL_TABLET | Freq: Two times a day (BID) | ORAL | 1 refills | Status: DC

## 2019-04-06 MED FILL — METAXALONE 800 MG TABS: 800 | 30 days supply | Qty: 90 | Fill #0

## 2019-04-07 DIAGNOSIS — M5136 Other intervertebral disc degeneration, lumbar region: Secondary | ICD-10-CM | POA: Diagnosis not present

## 2019-04-07 DIAGNOSIS — M5440 Lumbago with sciatica, unspecified side: Secondary | ICD-10-CM | POA: Diagnosis not present

## 2019-04-07 DIAGNOSIS — Z6825 Body mass index (BMI) 25.0-25.9, adult: Secondary | ICD-10-CM | POA: Diagnosis not present

## 2019-04-07 MED FILL — MELOXICAM 7.5 MG TABLET: 7.5 | 30 days supply | Qty: 60 | Fill #0

## 2019-04-07 MED FILL — GABAPENTIN 600 MG TABLET: 600 | 30 days supply | Qty: 90 | Fill #0

## 2019-04-13 MED FILL — MORPHINE SULF ER 30 MG TAB: 30 | 30 days supply | Qty: 30 | Fill #0

## 2019-04-15 MED FILL — oxyCODONE HCL 15 MG TABS: 15 | 30 days supply | Qty: 120 | Fill #0

## 2019-05-11 ENCOUNTER — Other Ambulatory Visit: Payer: Self-pay | Admitting: Family Medicine

## 2019-05-11 ENCOUNTER — Encounter: Payer: Self-pay | Admitting: Family Medicine

## 2019-05-11 ENCOUNTER — Other Ambulatory Visit: Payer: Self-pay

## 2019-05-11 ENCOUNTER — Ambulatory Visit (INDEPENDENT_AMBULATORY_CARE_PROVIDER_SITE_OTHER): Payer: 59 | Admitting: Family Medicine

## 2019-05-11 DIAGNOSIS — M5441 Lumbago with sciatica, right side: Secondary | ICD-10-CM

## 2019-05-11 DIAGNOSIS — J309 Allergic rhinitis, unspecified: Secondary | ICD-10-CM | POA: Insufficient documentation

## 2019-05-11 DIAGNOSIS — T7840XA Allergy, unspecified, initial encounter: Secondary | ICD-10-CM | POA: Diagnosis not present

## 2019-05-11 DIAGNOSIS — M5442 Lumbago with sciatica, left side: Secondary | ICD-10-CM | POA: Diagnosis not present

## 2019-05-11 DIAGNOSIS — G8929 Other chronic pain: Secondary | ICD-10-CM

## 2019-05-11 MED FILL — MORPHINE SULF ER 30 MG TAB: 30 | 30 days supply | Qty: 30 | Fill #0

## 2019-05-11 NOTE — Assessment & Plan Note (Signed)
Worsening chronic back pain has work-up scheduled with her surgeon Dr. Saintclair Halsted.

## 2019-05-11 NOTE — Assessment & Plan Note (Signed)
Reviewed care and treatment of allergies

## 2019-05-11 NOTE — Progress Notes (Signed)
   LMP 06/20/2016 (Approximate)    Subjective:    Patient ID: Jamie [name], female    DOB: 06/21/1971, 48 y.o.   MRN: [id number]  HPI: Jamie [name] is a 48 y.o. female  Med check Patient has history of fall allergies which have been exacerbated.  As a consequence she has had a runny nose and as a consequence of the times had a COVID-19 test which was negative.  Patient needs a note to return to work regarding her allergies. Reviewed allergy care and treatment. Patient is cleared to go back to work regarding her allergies. However: Patient fell at work exacerbating her chronic back pain.  She has a CT scheduled for evaluating her back and is unable to return to work due to back pain.  Relevant past medical, surgical, family and social history reviewed and updated as indicated. Interim medical history since our last visit reviewed. Allergies and medications reviewed and updated.  Review of Systems  Constitutional: Negative.   Respiratory: Negative.   Cardiovascular: Negative.     Per HPI unless specifically indicated above     Objective:    LMP 06/20/2016 (Approximate)   Wt Readings from Last 3 Encounters:  10/16/18 167 lb (75.8 kg)  09/26/18 169 lb 8 oz (76.9 kg)  07/21/18 165 lb 2 oz (74.9 kg)    Physical Exam      Assessment & Plan:   Problem List Items Addressed This Visit      Other   Chronic back pain    Worsening chronic back pain has work-up scheduled with her surgeon Dr. Saintclair Halsted.      Allergies    Reviewed care and treatment of allergies          Follow up plan: Return if symptoms worsen or fail to improve.

## 2019-05-12 ENCOUNTER — Other Ambulatory Visit: Payer: Self-pay

## 2019-05-12 ENCOUNTER — Encounter: Payer: Self-pay | Admitting: Emergency Medicine

## 2019-05-12 ENCOUNTER — Emergency Department
Admission: EM | Admit: 2019-05-12 | Discharge: 2019-05-12 | Disposition: A | Discharge status: Home / Self Care | Payer: PRIVATE HEALTH INSURANCE | Attending: Emergency Medicine | Admitting: Emergency Medicine

## 2019-05-12 ENCOUNTER — Emergency Department: Payer: PRIVATE HEALTH INSURANCE

## 2019-05-12 DIAGNOSIS — M5136 Other intervertebral disc degeneration, lumbar region: Secondary | ICD-10-CM | POA: Insufficient documentation

## 2019-05-12 DIAGNOSIS — Y9302 Activity, running: Secondary | ICD-10-CM | POA: Diagnosis not present

## 2019-05-12 DIAGNOSIS — W108XXA Fall (on) (from) other stairs and steps, initial encounter: Secondary | ICD-10-CM | POA: Insufficient documentation

## 2019-05-12 DIAGNOSIS — Z79899 Other long term (current) drug therapy: Secondary | ICD-10-CM | POA: Insufficient documentation

## 2019-05-12 DIAGNOSIS — Y990 Civilian activity done for income or pay: Secondary | ICD-10-CM | POA: Diagnosis not present

## 2019-05-12 DIAGNOSIS — Z87891 Personal history of nicotine dependence: Secondary | ICD-10-CM | POA: Diagnosis not present

## 2019-05-12 DIAGNOSIS — M5440 Lumbago with sciatica, unspecified side: Secondary | ICD-10-CM | POA: Diagnosis not present

## 2019-05-12 DIAGNOSIS — Y9289 Other specified places as the place of occurrence of the external cause: Secondary | ICD-10-CM | POA: Diagnosis not present

## 2019-05-12 DIAGNOSIS — W19XXXA Unspecified fall, initial encounter: Secondary | ICD-10-CM

## 2019-05-12 DIAGNOSIS — M51369 Other intervertebral disc degeneration, lumbar region without mention of lumbar back pain or lower extremity pain: Secondary | ICD-10-CM | POA: Insufficient documentation

## 2019-05-12 DIAGNOSIS — M545 Low back pain: Secondary | ICD-10-CM | POA: Diagnosis present

## 2019-05-12 MED ORDER — HYDROMORPHONE HCL 2 MG PO TABS: 1.0000 mg | ORAL_TABLET | Freq: Two times a day (BID) | ORAL | 0 refills | Status: AC | PRN

## 2019-05-12 MED ORDER — ONDANSETRON 8 MG PO TBDP
8.0000 mg | ORAL_TABLET | Freq: Once | ORAL | Status: AC
  Administered 2019-05-12: 8 mg via ORAL
  Filled 2019-05-12: qty 1

## 2019-05-12 MED ORDER — HYDROMORPHONE HCL 1 MG/ML IJ SOLN
1.0000 mg | Freq: Once | INTRAMUSCULAR | Status: AC
  Administered 2019-05-12: 1 mg via INTRAMUSCULAR
  Filled 2019-05-12: qty 1

## 2019-05-12 NOTE — Discharge Instructions (Signed)
Follow-up with either your regular doctor Dr. Saintclair Halsted, Dr. Cari Caraway, or return to the emergency department if worsening.  Take the Dilaudid only if you are regular doctor that prescribes her narcotics will allow you to have this due to your pain contract.  Let them know that I feel you need a higher dose for a few days due to the recent trauma.  Do not take Percocet with this medication.

## 2019-05-12 NOTE — ED Triage Notes (Signed)
Patient states she fell at work on Friday and since, she has had pain in lower back. Reports previous back surgeries with "hardware" placement.

## 2019-05-12 NOTE — ED Notes (Signed)
See triage note  Presents s/p fall on Friday  States she fell up the steps  Having pain to lower back  Pain is mainly to right side

## 2019-05-12 NOTE — ED Provider Notes (Signed)
[facility] Emergency Department Provider Note  ____________________________________________   First MD Initiated Contact with Patient 05/12/19 1624     (approximate)  I have reviewed the triage vital signs and the nursing notes.   HISTORY  Chief Complaint Back Pain    HPI Jamie [name] is a 48 y.o. female presents emergency department complaining of severe lower back pain.  Patient states that she was running up the steps and fell forward at work.  She states that she has had several back surgeries and something "just does not".  She states the pain has increased over the past few days.  She does take chronic pain medications regularly due to the number of back surgeries.  She states she has been consistent with these medications.  No new neurologic symptoms.  No change in bowel habits.  Patient states pain is radiating to the legs.  She also states that there were a couple of screws that were trying to come out of the hardware that her neurosurgeon was addressing.    Past Medical History:  Diagnosis Date  . Anxiety    takes Ativan daily.  Panic Attack  . Arthritis   . Chronic back pain    herniated disc/stenosis/scoliosis  . Depression    takes Effexor daily  . Eczema    uses a cream daily as needed  . Family history of adverse reaction to anesthesia    pta dad is very hard to wake up;excessive nausea  . History of bronchitis    > 5yr ago  . Hypoglycemia   . Insomnia    takes Lorazepam nightly  . Joint pain   . PONV (postoperative nausea and vomiting)   . Seasonal allergies    uses Flonase daily  . Vitamin D deficiency    takes Vit D daily    Patient Active Problem List   Diagnosis Date Noted  . Allergies 05/11/2019  . Insomnia 08/24/2017  . Chronic back pain 10/16/2016  . 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 Eczema 05/09/2015  . Anxiety 05/09/2015  . Depression, recurrent ([place]) 03/08/2015  . Spinal stenosis of lumbar region  09/01/2014    Past Surgical History:  Procedure Laterality Date  . BACK SURGERY  2008/2012   laminectomy 1st time and 2nd time laminectomy and fusion  . MAXIMUM ACCESS (MAS)POSTERIOR LUMBAR INTERBODY FUSION (PLIF) 2 LEVEL N/A 09/01/2014   Procedure: Lumbar four-five, Lumbar five-Sacral one Maximum Access Surgery posterior lumbar interbody fusion with interbody prosthesis posterior lateral arthrodesis posterior segmental instrumentation;  Surgeon: Elaina Hoops, MD;  Location: [place] [facility];  Service: Neurosurgery;  Laterality: N/A;  Lumbar four-five, Lumbar five-Sacral one Maximum Access Surgery posterior lumbar interbody fusion with i    Prior to Admission medications   Medication Sig Start Date End Date Taking? Authorizing Provider  ARIPiprazole (ABILIFY) 10 MG tablet Take 1 tablet (10 mg total) by mouth daily. 02/02/19   Guadalupe Maple, MD  busPIRone (BUSPAR) 15 MG tablet Take 1 tablet (15 mg total) by mouth 3 (three) times daily. 05/11/19   Guadalupe Maple, MD  cholecalciferol (VITAMIN D) 1000 UNITS tablet Take 1,000 Units by mouth daily.    [provider]  clonazePAM (KLONOPIN) 0.5 MG tablet Take 1 tablet (0.5 mg total) by mouth 2 (two) times daily. For further refills on this medication will need to see your provider 03/31/19   Guadalupe Maple, MD  Crisaborole (EUCRISA) 2 % OINT Apply 1 application topically 2 (two) times  daily as needed. 07/21/18   Volney American, PA-C  fluticasone [facility]) 50 MCG/ACT nasal spray Place 1 spray into both nostrils daily as needed for allergies.     [provider]  gabapentin (NEURONTIN) 600 MG tablet  10/03/18   [provider]  HYDROmorphone (DILAUDID) 2 MG tablet Take 0.5 tablets (1 mg total) by mouth every 12 (twelve) hours as needed for up to 5 days for severe pain. 05/12/19 05/17/19  Lothar Prehn, Linden Dolin, PA-C  metaxalone (SKELAXIN) 800 MG tablet Take 800 mg by mouth 3 (three) times daily. 09/06/18   [provider]   morphine (KADIAN) 30 MG 24 hr capsule Take 30 mg by mouth daily.    [provider]  oxyCODONE (ROXICODONE) 15 MG immediate release tablet Take 15 mg by mouth every 6 (six) hours as needed.  03/09/16   [provider]  triamcinolone cream (KENALOG) 0.1 % Apply 1 application topically 2 (two) times daily. 07/21/18   Volney American, PA-C  zolpidem (AMBIEN) 5 MG tablet Take 1 tablet (5 mg total) by mouth at bedtime as needed for sleep. 07/25/18   Volney American, PA-C    Allergies Shellfish allergy and Flexeril [cyclobenzaprine]  Family History  Problem Relation Age of Onset  . Arthritis Mother   . Hyperlipidemia Mother   . Migraines Mother   . Arthritis Father   . Diabetes Father   . Heart disease Father   . Diabetes Brother   . Breast cancer Maternal Grandmother     Social History Social History   Tobacco Use  . Smoking status: Former Smoker    Packs/day: 0.50    Years: 20.00    Pack years: 10.00    Quit date: 10/18/2017    Years since quitting: 1.5  . Smokeless tobacco: Never Used  Substance Use Topics  . Alcohol use: No  . Drug use: No    Review of Systems  Constitutional: No fever/chills Eyes: No visual changes. ENT: No sore throat. Respiratory: Denies cough Genitourinary: Negative for dysuria. Musculoskeletal: Positive for back pain. Skin: Negative for rash.    ____________________________________________   PHYSICAL EXAM:  VITAL SIGNS: ED Triage Vitals  Enc Vitals Group     BP 05/12/19 1615 (!) 98/54     Pulse Rate 05/12/19 1613 92     Resp 05/12/19 1615 16     Temp 05/12/19 1613 98.9 F (37.2 C)     Temp Source 05/12/19 1613 Oral     SpO2 05/12/19 1613 100 %     Weight 05/12/19 1615 160 lb (72.6 kg)     Height 05/12/19 1615 5\' 7"  (1.702 m)     Head Circumference --      Peak Flow --      Pain Score 05/12/19 1615 8     Pain Loc --      Pain Edu? --      Excl. in [place]? --     Constitutional: Alert and oriented.  Well appearing and in mild acute distress secondary to pain. Eyes: Conjunctivae are normal.  Head: Atraumatic. Nose: No congestion/rhinnorhea. Mouth/Throat: Mucous membranes are moist.   Neck:  supple no lymphadenopathy noted Cardiovascular: Normal rate, regular rhythm.  Respiratory: Normal respiratory effort.  No retractions GU: deferred Musculoskeletal: FROM all extremities, warm and well perfused, patient does move slowly secondary to pain.  Lumbar spine is tender to palpation.  Neurovascular is intact. Neurologic:  Normal speech and language.  Skin:  Skin is warm, dry  and intact. No rash noted. Psychiatric: Mood and affect are normal. Speech and behavior are normal.  ____________________________________________   LABS (all labs ordered are listed, but only abnormal results are displayed)  Labs Reviewed - No data to display ____________________________________________   ____________________________________________  RADIOLOGY  CT of the lumbar spine is stable.  No acute changes  ____________________________________________   PROCEDURES  Procedure(s) performed: Dilaudid 1 mg IM, Zofran 4 mg ODT   Procedures    ____________________________________________   INITIAL IMPRESSION / ASSESSMENT AND PLAN / ED COURSE  Pertinent labs & imaging results that were available during my care of the patient were reviewed by me and considered in my medical decision making (see chart for details).   Patient is 48 year old female presents emergency department with back pain.  See HPI  Physical exam shows lumbar spine to be tender.  Patient sent fair amount of pain.  Dilaudid 1 mg IM, Zofran 4 mg ODT CT of the lumbar spine due to the amount of hardware and chronic changes I do not feel plain x-ray will give [place] information we need.   Patient states she does feel a little better.  Pain is not completely gone.  We had a long discussion about follow-up.  Explained to her that she does  need to see her regular neurosurgeon or either see Dr. Cari Caraway.  She states she will.  I gave her a work note which essentially is putting her out of work for 5 days.  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  We also had a long discussion about whether or not to get the Dilaudid prescription refilled.  She is to call the physician who has her pain contract prior to filling this prescription.  Explained to her if she does get the prescription filled she is not to take her Percocet.  Explained to her that could possibly cause her to overdose.  She states she understands will comply with my instructions.  She is to use wet heat on her muscles and ice along with spine.  Follow-up with health at work for the [employer] Instructions.  Wray Hibma was evaluated in Emergency Department on 05/12/2019 for the symptoms described in the history of present illness. She was evaluated in the context of the global COVID-19 pandemic, which necessitated consideration that the patient might be at risk for infection with the SARS-CoV-2 virus that causes COVID-19. Institutional protocols and algorithms that pertain to the evaluation of patients at risk for COVID-19 are in a state of rapid change based on information released by regulatory bodies including the CDC and federal and state organizations. These policies and algorithms were followed during the patient's care in the ED.   As part of my medical decision making, I reviewed the following data within the [place] notes reviewed and incorporated, Old chart reviewed, Radiograph reviewed CT does not show any changes, Notes from prior ED visits and [place] Controlled Substance Database  ____________________________________________   FINAL CLINICAL IMPRESSION(S) / ED DIAGNOSES  Final diagnoses:  Acute midline low back pain with sciatica, sciatica laterality unspecified   Fall, initial encounter      NEW MEDICATIONS STARTED DURING THIS VISIT:  New Prescriptions   HYDROMORPHONE (DILAUDID) 2 MG TABLET    Take 0.5 tablets (1 mg total) by mouth every 12 (twelve) hours as needed for up to 5 days for severe pain.  Note:  This document was prepared using Dragon voice recognition software and may include unintentional dictation errors.    Versie Starks, PA-C 05/12/19 1807    Vanessa Osseo, MD 05/12/19 [phone]

## 2019-05-13 ENCOUNTER — Telehealth: Payer: Self-pay

## 2019-05-13 NOTE — Telephone Encounter (Signed)
PA for Eucrisa initiated and submitted via Cover My Meds. Key:  [id number]

## 2019-05-14 ENCOUNTER — Other Ambulatory Visit: Payer: Self-pay | Admitting: Neurosurgery

## 2019-05-15 MED FILL — METAXALONE 800 MG TABS: 800 | 30 days supply | Qty: 90 | Fill #1

## 2019-05-15 MED FILL — oxyCODONE HCL 15 MG TABS: 15 | 30 days supply | Qty: 120 | Fill #0

## 2019-05-19 NOTE — Telephone Encounter (Signed)
PA approved.

## 2019-05-21 DIAGNOSIS — M5441 Lumbago with sciatica, right side: Secondary | ICD-10-CM | POA: Diagnosis not present

## 2019-05-21 DIAGNOSIS — G8929 Other chronic pain: Secondary | ICD-10-CM | POA: Diagnosis not present

## 2019-05-21 DIAGNOSIS — M47816 Spondylosis without myelopathy or radiculopathy, lumbar region: Secondary | ICD-10-CM | POA: Diagnosis not present

## 2019-05-21 DIAGNOSIS — M4807 Spinal stenosis, lumbosacral region: Secondary | ICD-10-CM | POA: Diagnosis not present

## 2019-05-21 DIAGNOSIS — M960 Pseudarthrosis after fusion or arthrodesis: Secondary | ICD-10-CM | POA: Diagnosis not present

## 2019-05-25 ENCOUNTER — Other Ambulatory Visit: Payer: Self-pay | Admitting: Neurosurgery

## 2019-05-25 DIAGNOSIS — G8929 Other chronic pain: Secondary | ICD-10-CM

## 2019-05-25 NOTE — Progress Notes (Signed)
[facility] about procedure.

## 2019-05-31 ENCOUNTER — Encounter: Payer: Self-pay | Admitting: Family Medicine

## 2019-06-01 ENCOUNTER — Telehealth: Payer: Self-pay | Admitting: Family Medicine

## 2019-06-01 MED ORDER — CLONAZEPAM 0.5 MG PO TABS: 0.5000 mg | ORAL_TABLET | Freq: Two times a day (BID) | ORAL | 0 refills | Status: DC

## 2019-06-01 NOTE — Telephone Encounter (Signed)
Looks like Dr. Wynetta Emery had already sent through some pills this morning - she should check with her pharmacy on that

## 2019-06-01 NOTE — Telephone Encounter (Signed)
Called pt advised her to check pharmacy. She states that she will

## 2019-06-01 NOTE — Telephone Encounter (Signed)
LVM for pt to call back.

## 2019-06-01 NOTE — Telephone Encounter (Signed)
Pt.called regarding anxiety medication for tomorrow morning before procedure.

## 2019-06-02 ENCOUNTER — Ambulatory Visit
Admission: RE | Admit: 2019-06-02 | Discharge: 2019-06-02 | Disposition: A | Discharge status: Home / Self Care | Payer: 59 | Source: Ambulatory Visit | Attending: Neurosurgery | Admitting: Neurosurgery

## 2019-06-02 ENCOUNTER — Ambulatory Visit: Payer: 59 | Admitting: Family Medicine

## 2019-06-02 ENCOUNTER — Other Ambulatory Visit: Payer: Self-pay

## 2019-06-02 DIAGNOSIS — G8929 Other chronic pain: Secondary | ICD-10-CM

## 2019-06-02 DIAGNOSIS — M545 Low back pain: Secondary | ICD-10-CM | POA: Diagnosis not present

## 2019-06-02 DIAGNOSIS — M4326 Fusion of spine, lumbar region: Secondary | ICD-10-CM | POA: Insufficient documentation

## 2019-06-02 DIAGNOSIS — M5116 Intervertebral disc disorders with radiculopathy, lumbar region: Secondary | ICD-10-CM | POA: Diagnosis not present

## 2019-06-02 DIAGNOSIS — M5126 Other intervertebral disc displacement, lumbar region: Secondary | ICD-10-CM | POA: Diagnosis not present

## 2019-06-02 LAB — CBC
HCT: 40.6 % (ref 36.0–46.0)
Hemoglobin: 13.7 g/dL (ref 12.0–15.0)
MCH: 32.2 pg (ref 26.0–34.0)
MCHC: 33.7 g/dL (ref 30.0–36.0)
MCV: 95.3 fL (ref 80.0–100.0)
Platelets: 232 10*3/uL (ref 150–400)
RBC: 4.26 MIL/uL (ref 3.87–5.11)
RDW: 12.8 % (ref 11.5–15.5)
WBC: 8.3 10*3/uL (ref 4.0–10.5)
nRBC: 0 % (ref 0.0–0.2)

## 2019-06-02 LAB — PROTIME-INR
INR: 0.9 (ref 0.8–1.2)
Prothrombin Time: 12.5 seconds (ref 11.4–15.2)

## 2019-06-02 LAB — APTT: aPTT: 29 seconds (ref 24–36)

## 2019-06-02 MED ORDER — IOHEXOL 180 MG/ML  SOLN
20.0000 mL | Freq: Once | INTRAMUSCULAR | Status: AC | PRN
  Administered 2019-06-02: 15 mL via INTRATHECAL

## 2019-06-02 MED ORDER — ACETAMINOPHEN 500 MG PO TABS
1000.0000 mg | ORAL_TABLET | Freq: Four times a day (QID) | ORAL | Status: DC | PRN
  Filled 2019-06-02: qty 2

## 2019-06-02 NOTE — Progress Notes (Signed)
Patient given discharge instructions.  No bending or lifting for 48 hrs, lie flat as much as possible, drink plenty of fluids. Transportation contacted. Touched base with Radiologist to confirm discharge time.

## 2019-06-02 NOTE — Progress Notes (Signed)
Patient transported via stretcher to Ambulatory Surgery Room 22 from Radiology at [facility]. Informed patient to lie flat for 4 hours and she can drink clear liquids.

## 2019-06-02 NOTE — Progress Notes (Signed)
Transported patient to Radiology via stretcher.

## 2019-06-03 ENCOUNTER — Ambulatory Visit (INDEPENDENT_AMBULATORY_CARE_PROVIDER_SITE_OTHER): Payer: PRIVATE HEALTH INSURANCE | Admitting: Family Medicine

## 2019-06-03 ENCOUNTER — Encounter: Payer: Self-pay | Admitting: Family Medicine

## 2019-06-03 ENCOUNTER — Other Ambulatory Visit: Payer: Self-pay | Admitting: Family Medicine

## 2019-06-03 DIAGNOSIS — G4700 Insomnia, unspecified: Secondary | ICD-10-CM | POA: Diagnosis not present

## 2019-06-03 DIAGNOSIS — J3089 Other allergic rhinitis: Secondary | ICD-10-CM

## 2019-06-03 DIAGNOSIS — F419 Anxiety disorder, unspecified: Secondary | ICD-10-CM | POA: Diagnosis not present

## 2019-06-03 DIAGNOSIS — F339 Major depressive disorder, recurrent, unspecified: Secondary | ICD-10-CM | POA: Diagnosis not present

## 2019-06-03 DIAGNOSIS — Z1231 Encounter for screening mammogram for malignant neoplasm of breast: Secondary | ICD-10-CM

## 2019-06-03 MED ORDER — MONTELUKAST SODIUM 10 MG PO TABS: 10.0000 mg | ORAL_TABLET | Freq: Every day | ORAL | 3 refills | Status: DC

## 2019-06-03 MED ORDER — CLONAZEPAM 0.5 MG PO TABS: 0.5000 mg | ORAL_TABLET | Freq: Two times a day (BID) | ORAL | 0 refills | Status: DC

## 2019-06-03 MED FILL — MONTELUKAST SOD 10 MG TAB: 10 | 30 days supply | Qty: 30 | Fill #0

## 2019-06-03 MED FILL — clonazePAM 0.5 MG TABS: 0.5 | 30 days supply | Qty: 60 | Fill #0

## 2019-06-03 NOTE — Progress Notes (Signed)
LMP 06/20/2016 (Approximate)    Subjective:    Patient ID: Jamie [name], female    DOB: [birth year], 47 y.o.   MRN: [id number]  HPI: Jamie [name] is a 48 y.o. female  Chief Complaint  Patient presents with  . Depression  . Anxiety    . This visit was completed via WebEx due to the restrictions of the COVID-19 pandemic. All issues as above were discussed and addressed. Physical exam was done as above through visual confirmation on WebEx. If it was felt that the patient should be evaluated in the office, they were directed there. The patient verbally consented to this visit. . Location of the patient: in parked car . Location of the provider: work . Those involved with this call:  . Provider: Merrie Roof, PA-C . CMA: Tiffany Reel, CMA . Front Desk/Registration: Jill Side  . Time spent on call: 30 minutes with patient face to face via video conference. More than 50% of this time was spent in counseling and coordination of care. 10 minutes total spent in review of patient's record and preparation of their chart. I verified patient identity using two factors (patient name and date of birth). Patient consents verbally to being seen via telemedicine visit today.   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 Also having issues with her job. Also having issues with her own back problems and going through workup for that. Does feel like her buspirone helps and taking klonopin BID prn. Interested in counseling at some point but for now does not feel she has the time. Was given Abilify earlier this year but never started on it. Denies sI/HI but does have frequent crying spells.   Also dealing with scratchy throat, drainage, sinus pressure the past day or so. Known hx of allergies, not consistent with flonase. States she does not tolerate antihistamines due to palpitations. Not trying anything OTC for sxs. No fevers, chills, sick  contacts, N/V/D, CP, SOB. Had to get COVID 19 tested today for work and is out until the results come back.   Depression screen [facility] 2/9 06/03/2019 02/02/2019 09/26/2018  Decreased Interest 3 2 -  Down, Depressed, Hopeless 3 3 -  PHQ - 2 Score 6 5 -  Altered sleeping 3 3 -  Tired, decreased energy 3 3 -  Change in appetite 3 3 -  Feeling bad or failure about yourself  1 2 -  Trouble concentrating 1 3 -  Moving slowly or fidgety/restless 3 3 -  Suicidal thoughts 0 0 -  PHQ-9 Score 20 22 -  Difficult doing work/chores Somewhat difficult Not difficult at all (No Data)   GAD 7 : Generalized Anxiety Score 06/03/2019 02/02/2019 07/21/2018 02/06/2018  Nervous, Anxious, on Edge 3 3 3 2   Control/stop worrying 3 3 3 2   Worry too much - different things 3 3 3 2   Trouble relaxing 3 3 3 3   Restless 3 3 3 3   Easily annoyed or irritable 2 3 3 1   Afraid - awful might happen 2 3 1 2   Total GAD 7 Score 19 21 19 15   Anxiety Difficulty Somewhat difficult Not difficult at all Very difficult Somewhat difficult    Relevant past medical, surgical, family and social history reviewed and updated as indicated. Interim medical history since our last visit reviewed. Allergies and medications reviewed and updated.  Review of Systems  Per HPI unless specifically indicated above  Objective:    LMP 06/20/2016 (Approximate)   Wt Readings from Last 3 Encounters:  05/12/19 160 lb (72.6 kg)  10/16/18 167 lb (75.8 kg)  09/26/18 169 lb 8 oz (76.9 kg)    Physical Exam Vitals signs and nursing note reviewed.  Constitutional:      General: She is not in acute distress.    Appearance: Normal appearance.  HENT:     Head: Atraumatic.     Right Ear: External ear normal.     Left Ear: External ear normal.     Nose: Rhinorrhea present.     Mouth/Throat:     Mouth: Mucous membranes are moist.     Pharynx: Oropharynx is clear. Posterior oropharyngeal erythema present.  Eyes:     Extraocular Movements:  Extraocular movements intact.     Conjunctiva/sclera: Conjunctivae normal.  Neck:     Musculoskeletal: Normal range of motion.  Cardiovascular:     Comments: Unable to assess via virtual visit Pulmonary:     Effort: Pulmonary effort is normal. No respiratory distress.  Musculoskeletal: Normal range of motion.  Skin:    General: Skin is dry.     Findings: No erythema.  Neurological:     Mental Status: She is alert and oriented to person, place, and time.  Psychiatric:        Thought Content: Thought content normal.        Judgment: Judgment normal.     Comments: Tearful, incredibly anxious appearing     Results for orders placed or performed during the hospital encounter of 06/02/19  APTT  Result Value Ref Range   aPTT 29 24 - 36 seconds  CBC  Result Value Ref Range   WBC 8.3 4.0 - 10.5 K/uL   RBC 4.26 3.87 - 5.11 MIL/uL   Hemoglobin 13.7 12.0 - 15.0 g/dL   HCT 40.6 36.0 - 46.0 %   MCV 95.3 80.0 - 100.0 fL   MCH 32.2 26.0 - 34.0 pg   MCHC 33.7 30.0 - 36.0 g/dL   RDW 12.8 11.5 - 15.5 %   Platelets 232 150 - 400 K/uL   nRBC 0.0 0.0 - 0.2 %  Protime-INR  Result Value Ref Range   Prothrombin Time 12.5 11.4 - 15.2 seconds   INR 0.9 0.8 - 1.2      Assessment & Plan:   Problem List Items Addressed This Visit      Respiratory   Allergic rhinitis    Start singulair and BID use of flonase, mucinex and sinus rinses prn. Call if worsening or not improving        Other   Depression, recurrent ([place])    Continue buspar regimen, add abilify and monitor for benefit. Interested in counseling in the future when schedule allows      Anxiety - Primary    On buspar TID and klonopin BID prn which do help take the edge off but still having significant breakthrough because of stressors. Will add abilify and continue current regimen otherwise and monitor for benefit. F/u in 1 month for recheck      Insomnia    Has not been taking anything (seroquel or ambien) because she is  caregiver for her parents and fears being too sedated for them. Declines any medicines at this point. Work on sleep hygiene, stress reduction as able          Follow up plan: Return in about 4 weeks (around 07/01/2019) for Anxiety, mood f/u.

## 2019-06-03 NOTE — Assessment & Plan Note (Signed)
On buspar TID and klonopin BID prn which do help take the edge off but still having significant breakthrough because of stressors. Will add abilify and continue current regimen otherwise and monitor for benefit. F/u in 1 month for recheck

## 2019-06-03 NOTE — Assessment & Plan Note (Signed)
Continue buspar regimen, add abilify and monitor for benefit. Interested in counseling in the future when schedule allows

## 2019-06-03 NOTE — Assessment & Plan Note (Signed)
Start singulair and BID use of flonase, mucinex and sinus rinses prn. Call if worsening or not improving

## 2019-06-03 NOTE — Assessment & Plan Note (Signed)
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 Declines any medicines at this point. Work on sleep hygiene, stress reduction as able

## 2019-06-11 ENCOUNTER — Other Ambulatory Visit: Payer: Self-pay | Admitting: Orthopedic Surgery

## 2019-06-11 DIAGNOSIS — M545 Low back pain, unspecified: Secondary | ICD-10-CM

## 2019-06-27 ENCOUNTER — Ambulatory Visit
Admission: RE | Admit: 2019-06-27 | Discharge: 2019-06-27 | Disposition: A | Discharge status: Home / Self Care | Payer: PRIVATE HEALTH INSURANCE | Source: Ambulatory Visit | Attending: Orthopedic Surgery | Admitting: Orthopedic Surgery

## 2019-06-27 ENCOUNTER — Other Ambulatory Visit: Payer: Self-pay

## 2019-06-27 DIAGNOSIS — M545 Low back pain, unspecified: Secondary | ICD-10-CM

## 2019-06-30 ENCOUNTER — Ambulatory Visit (INDEPENDENT_AMBULATORY_CARE_PROVIDER_SITE_OTHER): Payer: PRIVATE HEALTH INSURANCE | Admitting: Family Medicine

## 2019-06-30 ENCOUNTER — Encounter: Payer: Self-pay | Admitting: Family Medicine

## 2019-06-30 ENCOUNTER — Other Ambulatory Visit: Payer: Self-pay

## 2019-06-30 VITALS — Temp 98.3°F

## 2019-06-30 DIAGNOSIS — F419 Anxiety disorder, unspecified: Secondary | ICD-10-CM | POA: Diagnosis not present

## 2019-06-30 DIAGNOSIS — F339 Major depressive disorder, recurrent, unspecified: Secondary | ICD-10-CM | POA: Diagnosis not present

## 2019-06-30 DIAGNOSIS — M79604 Pain in right leg: Secondary | ICD-10-CM | POA: Diagnosis not present

## 2019-06-30 DIAGNOSIS — M79605 Pain in left leg: Secondary | ICD-10-CM | POA: Diagnosis not present

## 2019-06-30 MED ORDER — CLONAZEPAM 0.5 MG PO TABS: 0.5000 mg | ORAL_TABLET | Freq: Two times a day (BID) | ORAL | 0 refills | Status: DC

## 2019-06-30 NOTE — Progress Notes (Signed)
Temp 98.3 F (36.8 C)   LMP 06/20/2016 (Approximate)    Subjective:    Patient ID: Jamie [name], female    DOB: [birth year], 48 y.o.   MRN: [id number]  HPI: Jamie [name] is a 48 y.o. female  Chief Complaint  Patient presents with  . Depression    4 week f/up   ANXIETY/DEPRESSION- did not take the abilify because she notes that it made her really sleepy. She notes that she took it during the day and it made her very tired. She has been under a lot of stress. 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 She is looking for another job. She is till caring for her parents, but they have not been doing particularly well. She also lost her dog. She is feeling out of control and notes that it has been a really bad month.  Duration:stable Anxious mood: yes  Excessive worrying: yes Irritability: yes  Sweating: no Nausea: no Palpitations:no Hyperventilation: no Panic attacks: yes Agoraphobia: yes  Obscessions/compulsions: yes Depressed mood: yes Depression screen [facility] 2/9 06/30/2019 06/03/2019 02/02/2019 09/26/2018 07/21/2018  Decreased Interest 2 3 2  - 3  Down, Depressed, Hopeless 3 3 3  - 3  PHQ - 2 Score 5 6 5  - 6  Altered sleeping 3 3 3  - 3  Tired, decreased energy 3 3 3  - 3  Change in appetite 3 3 3  - 3  Feeling bad or failure about yourself  1 1 2  - 3  Trouble concentrating 3 1 3  - 3  Moving slowly or fidgety/restless 1 3 3  - 2  Suicidal thoughts 0 0 0 - 0  PHQ-9 Score 19 20 22  - 23  Difficult doing work/chores Somewhat difficult Somewhat difficult Not difficult at all (No Data) Extremely dIfficult   GAD 7 : Generalized Anxiety Score 06/30/2019 06/03/2019 02/02/2019 07/21/2018  Nervous, Anxious, on Edge 3 3 3 3   Control/stop worrying 3 3 3 3   Worry too much - different things 3 3 3 3   Trouble relaxing 3 3 3 3   Restless 3 3 3 3   Easily annoyed or irritable 2 2 3 3   Afraid - awful might happen 2 2 3 1   Total GAD 7 Score 19 19 21 19   Anxiety Difficulty Somewhat difficult  Somewhat difficult Not difficult at all Very difficult   Anhedonia: no Weight changes: no Insomnia: yes   Hypersomnia: no Fatigue/loss of energy: yes Feelings of worthlessness: yes Feelings of guilt: yes Impaired concentration/indecisiveness: yes Suicidal ideations: no  Crying spells: yes Recent Stressors/Life Changes: yes   Relationship problems: no   Family stress: yes     Financial stress: yes    Job stress: yes    Recent death/loss: no  Relevant past medical, surgical, family and social history reviewed and updated as indicated. Interim medical history since our last visit reviewed. Allergies and medications reviewed and updated.  Review of Systems  Constitutional: Positive for fatigue. Negative for activity change, appetite change, chills, diaphoresis, fever and unexpected weight change.  HENT: Negative.   Respiratory: Negative.   Cardiovascular: Negative.   Gastrointestinal: Negative.   Musculoskeletal: Negative.   Skin: Negative.   Psychiatric/Behavioral: Positive for dysphoric mood. Negative for agitation, behavioral problems, confusion, decreased concentration, hallucinations, self-injury, sleep disturbance and suicidal ideas. The patient is nervous/anxious. The patient is not hyperactive.     Per HPI unless specifically indicated above     Objective:    Temp 98.3 F (36.8 C)   LMP 06/20/2016 (Approximate)  Wt Readings from Last 3 Encounters:  05/12/19 160 lb (72.6 kg)  10/16/18 167 lb (75.8 kg)  09/26/18 169 lb 8 oz (76.9 kg)    Physical Exam Vitals signs and nursing note reviewed.  Pulmonary:     Effort: Pulmonary effort is normal. No respiratory distress.     Comments: Speaking in full sentences Neurological:     Mental Status: She is alert.  Psychiatric:        Mood and Affect: Mood is anxious and depressed. Affect is tearful.        Behavior: Behavior normal.        Thought Content: Thought content normal.        Judgment: Judgment normal.      Results for orders placed or performed during the hospital encounter of 06/02/19  APTT  Result Value Ref Range   aPTT 29 24 - 36 seconds  CBC  Result Value Ref Range   WBC 8.3 4.0 - 10.5 K/uL   RBC 4.26 3.87 - 5.11 MIL/uL   Hemoglobin 13.7 12.0 - 15.0 g/dL   HCT 40.6 36.0 - 46.0 %   MCV 95.3 80.0 - 100.0 fL   MCH 32.2 26.0 - 34.0 pg   MCHC 33.7 30.0 - 36.0 g/dL   RDW 12.8 11.5 - 15.5 %   Platelets 232 150 - 400 K/uL   nRBC 0.0 0.0 - 0.2 %  Protime-INR  Result Value Ref Range   Prothrombin Time 12.5 11.4 - 15.2 seconds   INR 0.9 0.8 - 1.2      Assessment & Plan:   Problem List Items Addressed This Visit      Other   Depression, recurrent ([place])    About to start counseling. Will continue medication at this time. Will try taking 5mg  Abilify in the AM or 10mg  at bedtime. Continue to monitor. Recheck 1 month- she may benefit from psychiatry as she has tried many medications in the past.       Anxiety - Primary    About to start counseling. Will continue medication at this time. Will try taking 5mg  Abilify in the AM or 10mg  at bedtime. Continue to monitor. Recheck 1 month- she may benefit from psychiatry as she has tried many medications in the past.           Follow up plan: Return in about 4 weeks (around 07/28/2019).   . This visit was completed via telephone due to the restrictions of the COVID-19 pandemic. All issues as above were discussed and addressed but no physical exam was performed. If it was felt that the patient should be evaluated in the office, they were directed there. The patient verbally consented to this visit. Patient was unable to complete an audio/visual visit due to Lack of equipment. Due to the catastrophic nature of the COVID-19 pandemic, this visit was done through audio contact only. . Location of the patient: home . Location of the provider: home . Those involved with this call:  . Provider: Park Liter, DO . CMA: Tiffany Reel, CMA . Front  Desk/Registration: Don Perking  . Time spent on call: 21 minutes on the phone discussing health concerns. 23 minutes total spent in review of patient's record and preparation of their chart.

## 2019-06-30 NOTE — Assessment & Plan Note (Signed)
About to start counseling. Will continue medication at this time. Will try taking 5mg  Abilify in the AM or 10mg  at bedtime. Continue to monitor. Recheck 1 month- she may benefit from psychiatry as she has tried many medications in the past.

## 2019-07-02 DIAGNOSIS — M960 Pseudarthrosis after fusion or arthrodesis: Secondary | ICD-10-CM | POA: Diagnosis not present

## 2019-07-08 ENCOUNTER — Other Ambulatory Visit: Payer: Self-pay | Admitting: Orthopedic Surgery

## 2019-07-08 DIAGNOSIS — M533 Sacrococcygeal disorders, not elsewhere classified: Secondary | ICD-10-CM

## 2019-07-08 MED FILL — MORPHINE SULF ER 30 MG TAB: 30 | 15 days supply | Qty: 15 | Fill #0

## 2019-07-10 DIAGNOSIS — M5136 Other intervertebral disc degeneration, lumbar region: Secondary | ICD-10-CM | POA: Diagnosis not present

## 2019-07-10 DIAGNOSIS — M5416 Radiculopathy, lumbar region: Secondary | ICD-10-CM | POA: Diagnosis not present

## 2019-07-13 MED FILL — oxyCODONE HCL 15 MG TABS: 15 | 10 days supply | Qty: 40 | Fill #0

## 2019-07-23 ENCOUNTER — Other Ambulatory Visit: Payer: Self-pay | Admitting: Family Medicine

## 2019-07-23 DIAGNOSIS — M5136 Other intervertebral disc degeneration, lumbar region: Secondary | ICD-10-CM | POA: Diagnosis not present

## 2019-07-23 DIAGNOSIS — M5440 Lumbago with sciatica, unspecified side: Secondary | ICD-10-CM | POA: Diagnosis not present

## 2019-07-23 MED FILL — GABAPENTIN 600 MG TABLET: 600 | 30 days supply | Qty: 90 | Fill #0

## 2019-07-23 MED FILL — METAXALONE 800 MG TABLET: 800 | 30 days supply | Qty: 90 | Fill #0

## 2019-07-23 MED FILL — oxyCODONE HCL 15 MG TABS: 15 | 30 days supply | Qty: 120 | Fill #0

## 2019-07-23 MED FILL — MORPHINE SULF ER 30 MG TAB: 30 | 30 days supply | Qty: 30 | Fill #0

## 2019-07-23 MED FILL — MELOXICAM 7.5 MG TABLET: 7.5 | 30 days supply | Qty: 60 | Fill #0

## 2019-07-23 NOTE — Telephone Encounter (Signed)
Routing to provider  

## 2019-07-23 NOTE — Telephone Encounter (Signed)
Needs appointment

## 2019-07-23 NOTE — Telephone Encounter (Signed)
Scheduled appt with Jamie [name] tomorrow due to her schedule

## 2019-07-24 ENCOUNTER — Ambulatory Visit (INDEPENDENT_AMBULATORY_CARE_PROVIDER_SITE_OTHER): Payer: PRIVATE HEALTH INSURANCE | Admitting: Family Medicine

## 2019-07-24 ENCOUNTER — Ambulatory Visit
Admission: RE | Admit: 2019-07-24 | Discharge: 2019-07-24 | Disposition: A | Discharge status: Home / Self Care | Payer: PRIVATE HEALTH INSURANCE | Source: Ambulatory Visit | Attending: Orthopedic Surgery | Admitting: Orthopedic Surgery

## 2019-07-24 ENCOUNTER — Encounter: Payer: Self-pay | Admitting: Family Medicine

## 2019-07-24 ENCOUNTER — Other Ambulatory Visit: Payer: Self-pay

## 2019-07-24 VITALS — Wt 155.0 lb

## 2019-07-24 DIAGNOSIS — M533 Sacrococcygeal disorders, not elsewhere classified: Secondary | ICD-10-CM

## 2019-07-24 DIAGNOSIS — F419 Anxiety disorder, unspecified: Secondary | ICD-10-CM

## 2019-07-24 DIAGNOSIS — F339 Major depressive disorder, recurrent, unspecified: Secondary | ICD-10-CM

## 2019-07-24 DIAGNOSIS — G4700 Insomnia, unspecified: Secondary | ICD-10-CM

## 2019-07-24 NOTE — Progress Notes (Signed)
Wt 155 lb (70.3 kg)   LMP 06/20/2016 (Approximate)   BMI 24.28 kg/m    Subjective:    Patient ID: Jamie [name], female    DOB: 01-26-1971, 48 y.o.   MRN: [id number]  HPI: Jamie [name] is a 48 y.o. female  Chief Complaint  Patient presents with  . Anxiety    clonazepam refill  . Depression    . This visit was completed via WebEx due to the restrictions of the COVID-19 pandemic. All issues as above were discussed and addressed. Physical exam was done as above through visual confirmation on WebEx. If it was felt that the patient should be evaluated in the office, they were directed there. The patient verbally consented to this visit. . Location of the patient: home . Location of the provider: work . Those involved with this call:  . Provider: Merrie Roof, PA-C . CMA: Lesle Chris, [place] . Front Desk/Registration: Jill Side  . Time spent on call: 30 minutes with patient face to face via video conference. More than 50% of this time was spent in counseling and coordination of care. 10 minutes total spent in review of patient's record and preparation of their chart. I verified patient identity using two factors (patient name and date of birth). Patient consents verbally to being seen via telemedicine visit today.   Here today for anxiety and depression follow up. Taking the 5 mg abilify at bedtime which seems to be working well for her. Only taking the seroquel rarely as needed, mainly if her parents shouldn't need anything that night as she is primary caregiver and the medicine makes her sleep so hard. Taking the buspar TID which helps quite a bit. Klonopin seems to be helping some but having to take BID every day and still having constant panic feelings. Fredrich Birks has been mean to her, parents health declining, her own healthy issues are all weighing on her heavily. Denies SI/HI but often finds herself crying, feeling significant down.   Depression screen [facility] 2/9 07/24/2019 06/30/2019  06/03/2019  Decreased Interest 2 2 3   Down, Depressed, Hopeless 3 3 3   PHQ - 2 Score 5 5 6   Altered sleeping 3 3 3   Tired, decreased energy 2 3 3   Change in appetite 3 3 3   Feeling bad or failure about yourself  2 1 1   Trouble concentrating 3 3 1   Moving slowly or fidgety/restless 1 1 3   Suicidal thoughts 0 0 0  PHQ-9 Score 19 19 20   Difficult doing work/chores Somewhat difficult Somewhat difficult Somewhat difficult   GAD 7 : Generalized Anxiety Score 07/24/2019 06/30/2019 06/03/2019 02/02/2019  Nervous, Anxious, on Edge 3 3 3 3   Control/stop worrying 3 3 3 3   Worry too much - different things 3 3 3 3   Trouble relaxing 3 3 3 3   Restless 3 3 3 3   Easily annoyed or irritable 3 2 2 3   Afraid - awful might happen 3 2 2 3   Total GAD 7 Score 21 19 19 21   Anxiety Difficulty Very difficult Somewhat difficult Somewhat difficult Not difficult at all   Relevant past medical, surgical, family and social history reviewed and updated as indicated. Interim medical history since our last visit reviewed. Allergies and medications reviewed and updated.  Review of Systems  Per HPI unless specifically indicated above     Objective:    Wt 155 lb (70.3 kg)   LMP 06/20/2016 (Approximate)   BMI 24.28 kg/m   Wt Readings from Last 3  Encounters:  07/24/19 155 lb (70.3 kg)  05/12/19 160 lb (72.6 kg)  10/16/18 167 lb (75.8 kg)    Physical Exam Vitals signs and nursing note reviewed.  Constitutional:      General: She is not in acute distress.    Appearance: Normal appearance.  HENT:     Head: Atraumatic.     Right Ear: External ear normal.     Left Ear: External ear normal.     Nose: Nose normal. No congestion.     Mouth/Throat:     Mouth: Mucous membranes are moist.     Pharynx: Oropharynx is clear. No posterior oropharyngeal erythema.  Eyes:     Extraocular Movements: Extraocular movements intact.     Conjunctiva/sclera: Conjunctivae normal.  Neck:     Musculoskeletal: Normal range  of motion.  Cardiovascular:     Comments: Unable to assess via virtual visit Pulmonary:     Effort: Pulmonary effort is normal. No respiratory distress.  Musculoskeletal: Normal range of motion.  Skin:    General: Skin is dry.     Findings: No erythema.  Neurological:     Mental Status: She is alert and oriented to person, place, and time.  Psychiatric:        Thought Content: Thought content normal.        Judgment: Judgment normal.     Comments: Significantly tearful during visit     Results for orders placed or performed during the hospital encounter of 06/02/19  APTT  Result Value Ref Range   aPTT 29 24 - 36 seconds  CBC  Result Value Ref Range   WBC 8.3 4.0 - 10.5 K/uL   RBC 4.26 3.87 - 5.11 MIL/uL   Hemoglobin 13.7 12.0 - 15.0 g/dL   HCT 40.6 36.0 - 46.0 %   MCV 95.3 80.0 - 100.0 fL   MCH 32.2 26.0 - 34.0 pg   MCHC 33.7 30.0 - 36.0 g/dL   RDW 12.8 11.5 - 15.5 %   Platelets 232 150 - 400 K/uL   nRBC 0.0 0.0 - 0.2 %  Protime-INR  Result Value Ref Range   Prothrombin Time 12.5 11.4 - 15.2 seconds   INR 0.9 0.8 - 1.2      Assessment & Plan:   Problem List Items Addressed This Visit      Other   Depression, recurrent ([place]) - Primary    Significantly depressed despite numerous medications tried. Will refer to Psychiatry for counseling and med management. Continue current regimen      Relevant Medications   busPIRone (BUSPAR) 15 MG tablet   Other Relevant Orders   Ambulatory referral to Psychiatry   Anxiety    Significantly exacerbated due to stressors. Referral to Psychiatry placed for further evaluation and management. Continue current regimen in meantime.       Relevant Medications   busPIRone (BUSPAR) 15 MG tablet   Other Relevant Orders   Ambulatory referral to Psychiatry   Insomnia    Doing fairly well on abilify and prn seroquel at bedtime. Continue current regimen      Relevant Orders   Ambulatory referral to Psychiatry      Greater than 25  minutes spent today in direct care and counseling with patient  Follow up plan: Return in about 3 months (around 10/22/2019) for Anxiety f/u if not in with Psychiatry.

## 2019-07-27 ENCOUNTER — Other Ambulatory Visit: Payer: Self-pay | Admitting: Family Medicine

## 2019-07-27 NOTE — Telephone Encounter (Signed)
Requested medication (s) are due for refill today: Due 12/10  Requested medication (s) are on the active medication list: yes     Last refill: 06/30/2019 #60 0 refills  Future visit scheduled No   Notes to clinic:not delegated  Requested Prescriptions  Pending Prescriptions Disp Refills   clonazePAM (KLONOPIN) 0.5 MG tablet 60 tablet 0    Sig: Take 1 tablet (0.5 mg total) by mouth 2 (two) times daily.     Not Delegated - Psychiatry:  Anxiolytics/Hypnotics Failed - 07/27/2019  5:44 PM      Failed - This refill cannot be delegated      Failed - Urine Drug Screen completed in last 360 days.      Passed - Valid encounter within last 6 months    Recent Outpatient Visits          3 days ago Depression, recurrent [facility])   [facility] Volney American, [place]   3 weeks ago [place], [place], DO   1 month ago [place], [place]   2 months ago Chronic bilateral low back pain with bilateral sciatica   [facility] Jeananne Rama, Jeannette How, MD   5 months ago Depression, recurrent [facility])   [facility] [place], Jeannette How, MD

## 2019-07-27 NOTE — Telephone Encounter (Signed)
Medication Refill - Medication: clonazePAM (KLONOPIN) 0.5 MG tablet    Has the patient contacted their pharmacy? Yes.   (Agent: If no, request that the patient contact the pharmacy for the refill.) (Agent: If yes, when and what did the pharmacy advise?)  Preferred Pharmacy (with phone number or street name):  [facility] [place], [place] - [address]  [address] [place] [address]  Phone: [phone] Fax: [phone]     Agent: Please be advised that RX refills may take up to 3 business days. We ask that you follow-up with your pharmacy.  

## 2019-07-28 NOTE — Telephone Encounter (Signed)
Routing to provider  

## 2019-07-29 ENCOUNTER — Encounter: Payer: Self-pay | Admitting: Family Medicine

## 2019-07-29 MED ORDER — CLONAZEPAM 0.5 MG PO TABS: 0.5000 mg | ORAL_TABLET | Freq: Two times a day (BID) | ORAL | 0 refills | Status: DC

## 2019-07-29 MED ORDER — BUSPIRONE HCL 15 MG PO TABS: 15.0000 mg | ORAL_TABLET | Freq: Three times a day (TID) | ORAL | 1 refills | Status: DC

## 2019-07-29 NOTE — Assessment & Plan Note (Signed)
Significantly exacerbated due to stressors. Referral to Psychiatry placed for further evaluation and management. Continue current regimen in meantime.

## 2019-07-29 NOTE — Assessment & Plan Note (Signed)
Doing fairly well on abilify and prn seroquel at bedtime. Continue current regimen

## 2019-07-29 NOTE — Assessment & Plan Note (Signed)
Significantly depressed despite numerous medications tried. Will refer to Psychiatry for counseling and med management. Continue current regimen

## 2019-08-17 DIAGNOSIS — H5213 Myopia, bilateral: Secondary | ICD-10-CM | POA: Diagnosis not present

## 2019-08-20 MED FILL — MELOXICAM 7.5 MG TABLET: 7.5 | 30 days supply | Qty: 60 | Fill #1

## 2019-08-20 MED FILL — METAXALONE 800 MG TABS: 800 | 30 days supply | Qty: 90 | Fill #1

## 2019-08-22 MED FILL — MORPHINE SULF ER 30 MG TAB: 30 | 30 days supply | Qty: 30 | Fill #0

## 2019-08-22 MED FILL — oxyCODONE HCL 15 MG TABS: 15 | 30 days supply | Qty: 120 | Fill #0

## 2019-08-23 DIAGNOSIS — S62346A Nondisplaced fracture of base of fifth metacarpal bone, right hand, initial encounter for closed fracture: Secondary | ICD-10-CM | POA: Diagnosis not present

## 2019-08-23 DIAGNOSIS — S62616A Displaced fracture of proximal phalanx of right little finger, initial encounter for closed fracture: Secondary | ICD-10-CM | POA: Diagnosis not present

## 2019-08-23 DIAGNOSIS — S62646A Nondisplaced fracture of proximal phalanx of right little finger, initial encounter for closed fracture: Secondary | ICD-10-CM | POA: Diagnosis not present

## 2019-08-23 DIAGNOSIS — S6991XA Unspecified injury of right wrist, hand and finger(s), initial encounter: Secondary | ICD-10-CM | POA: Diagnosis not present

## 2019-08-23 DIAGNOSIS — W548XXA Other contact with dog, initial encounter: Secondary | ICD-10-CM | POA: Diagnosis not present

## 2019-08-24 ENCOUNTER — Ambulatory Visit: Payer: Self-pay

## 2019-08-24 ENCOUNTER — Other Ambulatory Visit: Payer: Self-pay

## 2019-08-24 ENCOUNTER — Ambulatory Visit: Payer: PRIVATE HEALTH INSURANCE

## 2019-08-24 DIAGNOSIS — G8929 Other chronic pain: Secondary | ICD-10-CM | POA: Insufficient documentation

## 2019-08-24 DIAGNOSIS — M79604 Pain in right leg: Secondary | ICD-10-CM | POA: Diagnosis not present

## 2019-08-24 DIAGNOSIS — M6281 Muscle weakness (generalized): Secondary | ICD-10-CM | POA: Insufficient documentation

## 2019-08-24 DIAGNOSIS — R252 Cramp and spasm: Secondary | ICD-10-CM

## 2019-08-24 DIAGNOSIS — M5441 Lumbago with sciatica, right side: Secondary | ICD-10-CM | POA: Insufficient documentation

## 2019-08-24 DIAGNOSIS — S62619D Displaced fracture of proximal phalanx of unspecified finger, subsequent encounter for fracture with routine healing: Secondary | ICD-10-CM | POA: Diagnosis not present

## 2019-08-24 NOTE — Patient Instructions (Signed)
Access Code: [email or web address]  URL: [email or web address]  Date: 08/24/2019  Prepared by: Sigurd Sos   Exercises Seated Hamstring Stretch - 3 reps - 20 hold - 3x daily - 7x weekly Seated Piriformis Stretch with Trunk Bend - 3 reps - 20 hold - 3x daily - 7x weekly

## 2019-08-24 NOTE — Therapy (Signed)
[facility] [address] [place], [place], [address] Phone: [phone]   Fax:  [phone]  Physical Therapy Evaluation  Patient Details  Name: Jamie [name] MRN: [id number] Date of Birth: Dec 04, 1970 Referring Provider (PT): Phylliss Bob, MD   Encounter Date: 08/24/2019  PT End of Session - 08/24/19 1231    Visit Number  1    Date for PT Re-Evaluation  10/19/19    Authorization Type  Workers Comp    Authorization - Visit Number  1    Authorization - Number of Visits  8    PT Start Time  1149    PT Stop Time  1231    PT Time Calculation (min)  42 min    Activity Tolerance  Patient tolerated treatment well    Behavior During Therapy  [facility] for tasks assessed/performed       Past Medical History:  Diagnosis Date  . Anxiety    takes Ativan daily.  Panic Attack  . Arthritis   . Chronic back pain    herniated disc/stenosis/scoliosis  . Depression    takes Effexor daily  . Eczema    uses a cream daily as needed  . Family history of adverse reaction to anesthesia    pta dad is very hard to wake up;excessive nausea  . History of bronchitis    > 19yr ago  . Hypoglycemia   . Insomnia    takes Lorazepam nightly  . Joint pain   . PONV (postoperative nausea and vomiting)   . Seasonal allergies    uses Flonase daily  . Vitamin D deficiency    takes Vit D daily    Past Surgical History:  Procedure Laterality Date  . BACK SURGERY  2008/2012   laminectomy 1st time and 2nd time laminectomy and fusion  . MAXIMUM ACCESS (MAS)POSTERIOR LUMBAR INTERBODY FUSION (PLIF) 2 LEVEL N/A 09/01/2014   Procedure: Lumbar four-five, Lumbar five-Sacral one Maximum Access Surgery posterior lumbar interbody fusion with interbody prosthesis posterior lateral arthrodesis posterior segmental instrumentation;  Surgeon: Elaina Hoops, MD;  Location: [facility];  Service: Neurosurgery;  Laterality: N/A;  Lumbar four-five, Lumbar five-Sacral  one Maximum Access Surgery posterior lumbar interbody fusion with i    There were no vitals filed for this visit.   Subjective Assessment - 08/24/19 1154    Subjective  Pt presents to PT with complaints of LBP and Rt LE radiculopathy that began with a fall at work 04/2019. Pt has history of 4 lumbar surgeries with hardware placement.  Pt had an epidural shot with improvement in symptoms. Pt is not sure if her old fusion is fused and has had a 2nd opinion to determine.    Pertinent History  Lumbar surgery: 2008 & 2012 lumbar laminectomy, PLIF 08/2014 & 07/2015.  Fall at work on steps: 04/2019.    How long can you sit comfortably?  limited to 30 minutes- baseline for this patient    How long can you stand comfortably?  45 minutes to 1 hour    Diagnostic tests  x-ray: unsure of lumbar fusion status    Patient Stated Goals  reduce lumbar pain, reduce Rt LE radiculopathy    Currently in Pain?  Yes    Pain Score  5    up to 7-8/10   Pain Location  Back    Pain Orientation  Right    Pain Descriptors / Indicators  Aching;Burning;Shooting;Sore    Pain Type  Chronic pain  Pain Radiating Towards  Rt LE    Pain Onset  More than a month ago    Pain Frequency  Constant    Aggravating Factors   sleep at night, sitting, walking    Pain Relieving Factors  change of position, heat/ice, pain medication         [facility] PT Assessment - 08/24/19 0001      Assessment   Medical Diagnosis  Lumbago, SI joint dysfunction    Referring Provider (PT)  Phylliss Bob, MD    Onset Date/Surgical Date  05/08/19    Prior Therapy  none      Precautions   Precautions  None      Restrictions   Weight Bearing Restrictions  No      Balance Screen   Has the patient fallen in the past 6 months  Yes    How many times?  1   walking up steps at work- no significant balance deficits   Has the patient had a decrease in activity level because of a fear of falling?   No    Is the patient reluctant to leave their home  because of a fear of falling?   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      Cognition   Overall Cognitive Status  Within Functional Limits for tasks assessed      Observation/Other Assessments   Focus on Therapeutic Outcomes (FOTO)   52% limitation      Posture/Postural Control   Posture/Postural Control  No significant limitations      ROM / Strength   AROM / PROM / Strength  AROM;PROM;Strength      AROM   Overall AROM   Within functional limits for tasks performed    Overall AROM Comments  lumbar A/ROM is WFLs s/p fusion with pain on the Rt with end range sidebending bilaterally'      PROM   Overall PROM   Deficits    Overall PROM Comments  bil hip flexibility limited by 25% with pain on the Rt at end range with overpressure      Strength   Overall Strength  Deficits    Overall Strength Comments  Lt LE 4/5 throughout (chronic weakness per pt report), Rt LE 4/5 with pain upon testing throughout      Palpation   Spinal mobility  fusion in lumbar spine.  Reduced thoracic segmental mobility    Palpation comment  tension and trigger points in bil quadratus Rt>Lt, lumbar paraspinals and Rt>Lt gluteals      Special Tests    Special Tests  Lumbar    Lumbar Tests  Slump Test      Slump test   Findings  Negative    Side  Right      Transfers   Transfers  Sit to Stand;Stand to Sit    Sit to Stand  With upper extremity assist    Stand to Sit  With upper extremity assist      Ambulation/Gait   Ambulation/Gait  Yes    Gait Pattern  Step-through pattern;Decreased stance time - right;Decreased  stride length                Objective measurements completed on examination: See above  findings.              PT Education - 08/24/19 1227    Education Details  Access Code: [email or web address]    Person(s) Educated  Patient    Methods  Explanation;Handout    Comprehension  Verbalized understanding;Returned demonstration       PT Short Term Goals - 08/24/19 1153      PT SHORT TERM GOAL #1   Title  be indpendent in initial HEP    Time  4    Period  Weeks    Status  New    Target Date  09/21/19      PT SHORT TERM GOAL #2   Title  report a 25% improvement in the quality and quantity of sleep    Time  4    Period  Weeks    Status  New    Target Date  09/21/19      PT SHORT TERM GOAL #3   Title  report a 25% reduction in LBP and Rt LE radiculopathy with home and work tasks    Time  4    Period  Weeks    Status  New    Target Date  09/21/19        PT Long Term Goals - 08/24/19 1153      PT LONG TERM GOAL #1   Title  be independent in advanced HEP    Time  8    Period  Weeks    Status  New    Target Date  10/19/19      PT LONG TERM GOAL #2   Title  reduce FOTO to < or = to 45% limitation    Time  8    Period  Weeks    Status  New    Target Date  10/19/19      PT LONG TERM GOAL #3   Title  reduce pain to allow pt to walk for > or = to 1 hour without limitation in the community    Time  8    Period  Weeks    Status  New    Target Date  10/19/19      PT LONG TERM GOAL #4   Title  report a 50% improvement in the quality and quantity of sleep due to reduce pain at night    Time  8    Period  Weeks    Status  New    Target Date  10/19/19      PT LONG TERM GOAL #5   Title  report a 50% reduction in LBP and Rt LE pain with home and work tasks    Time  8    Period  Weeks    Status  New    Target Date  10/19/19             Plan - 08/24/19 1317    Clinical Impression Statement  Pt presents to PT with complaints of LBP with Rt LE radiculopathy that was exacerbated by a fall up the steps at work on 05/08/2019.  Pt has history of 4 lumbar  surgeries including PLIF x 2 in 2016.  Pt reports 5-8/10 LBP with Rt LE radiculopathy.  Pain is worse with sleep at night and pt is waking every hour with pain.  Pt demonstrates  Rt and Lt LE weakness, poor core activation and antalgic gait.  Pt with trigger points and tension over Rt>Lt quadratus, lumbar paraspinals and gluteals.  Pt will benefit from skilled PT to address flexibility, LE and core strength and manual/modalities to address pain.    Personal Factors and Comorbidities  Comorbidity 2    Comorbidities  history of 4 lumbar surgeries, Rt hand injury this week    Examination-Activity Limitations  Bed Mobility;Locomotion Level;Sit;Stand    Examination-Participation Restrictions  Community Activity;Shop;Meal Prep    Stability/Clinical Decision Making  Evolving/Moderate complexity    Clinical Decision Making  Moderate    Rehab Potential  Good    PT Frequency  2x / week    PT Duration  8 weeks    PT Treatment/Interventions  ADLs/Self Care Home Management;Cryotherapy;Electrical Stimulation;Traction;Moist Heat;Iontophoresis 4mg /ml Dexamethasone;Stair training;Gait training;Functional mobility training;Therapeutic activities;Therapeutic exercise;Neuromuscular re-education;Manual techniques;Patient/family education;Passive range of motion;Dry needling;Taping    PT Next Visit Plan  review HEP, work on core and hip strength, dry needling to Rt gluteals and quadratus if pt agrees    PT Home Exercise Plan  Access Code: [email or web address]    Recommended Other Services  Home TENS unit for pain management       Patient will benefit from skilled therapeutic intervention in order to improve the following deficits and impairments:  Abnormal gait, Decreased activity tolerance, Decreased mobility, Decreased endurance, Decreased range of motion, Decreased strength, Difficulty walking, Increased muscle spasms, Impaired flexibility  Visit Diagnosis: Chronic right-sided low back pain with right-sided sciatica - Plan: PT  plan of care cert/re-cert  Cramp and spasm - Plan: PT plan of care cert/re-cert  Muscle weakness (generalized) - Plan: PT plan of care cert/re-cert     Problem List Patient Active Problem List   Diagnosis Date Noted  . Allergic rhinitis 05/11/2019  . Insomnia 08/24/2017  . Chronic back pain 10/16/2016  . 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 Eczema 05/09/2015  . Anxiety 05/09/2015  . Depression, recurrent ([place]) 03/08/2015  . Spinal stenosis of lumbar region 09/01/2014     Sigurd Sos, PT 08/24/19 1:24 PM  [facility] [address], [place], [place], [address] Phone: [phone]   Fax:  [phone]  Name: Jamie [name] MRN: [id number] Date of Birth: [birth year]

## 2019-08-26 ENCOUNTER — Other Ambulatory Visit: Payer: Self-pay

## 2019-08-26 ENCOUNTER — Ambulatory Visit: Payer: PRIVATE HEALTH INSURANCE

## 2019-08-26 DIAGNOSIS — R252 Cramp and spasm: Secondary | ICD-10-CM

## 2019-08-26 DIAGNOSIS — M6281 Muscle weakness (generalized): Secondary | ICD-10-CM

## 2019-08-26 DIAGNOSIS — M79604 Pain in right leg: Secondary | ICD-10-CM | POA: Diagnosis not present

## 2019-08-26 DIAGNOSIS — G8929 Other chronic pain: Secondary | ICD-10-CM

## 2019-08-26 NOTE — Therapy (Signed)
[facility] [address], [place], [place], [address] Phone: [phone]   Fax:  [phone]  Physical Therapy Treatment  Patient Details  Name: Jamie [name] MRN: [id number] Date of Birth: 1970/09/27 Referring Provider (PT): Phylliss Bob, MD   Encounter Date: 08/26/2019  PT End of Session - 08/26/19 1229    Visit Number  2    Date for PT Re-Evaluation  10/19/19    Authorization Type  Workers Comp    Authorization - Visit Number  2    Authorization - Number of Visits  8    PT Start Time  1147    PT Stop Time  1242    PT Time Calculation (min)  55 min    Activity Tolerance  Patient tolerated treatment well    Behavior During Therapy  [facility] for tasks assessed/performed       Past Medical History:  Diagnosis Date  . Anxiety    takes Ativan daily.  Panic Attack  . Arthritis   . Chronic back pain    herniated disc/stenosis/scoliosis  . Depression    takes Effexor daily  . Eczema    uses a cream daily as needed  . Family history of adverse reaction to anesthesia    pta dad is very hard to wake up;excessive nausea  . History of bronchitis    > 105yr ago  . Hypoglycemia   . Insomnia    takes Lorazepam nightly  . Joint pain   . PONV (postoperative nausea and vomiting)   . Seasonal allergies    uses Flonase daily  . Vitamin D deficiency    takes Vit D daily    Past Surgical History:  Procedure Laterality Date  . BACK SURGERY  2008/2012   laminectomy 1st time and 2nd time laminectomy and fusion  . MAXIMUM ACCESS (MAS)POSTERIOR LUMBAR INTERBODY FUSION (PLIF) 2 LEVEL N/A 09/01/2014   Procedure: Lumbar four-five, Lumbar five-Sacral one Maximum Access Surgery posterior lumbar interbody fusion with interbody prosthesis posterior lateral arthrodesis posterior segmental instrumentation;  Surgeon: Elaina Hoops, MD;  Location: [facility];  Service: Neurosurgery;  Laterality: N/A;  Lumbar four-five, Lumbar five-Sacral  one Maximum Access Surgery posterior lumbar interbody fusion with i    There were no vitals filed for this visit.  Subjective Assessment - 08/26/19 1148    Subjective  I was hurting more earlier this morning.  My Lt side is hurting today.    Currently in Pain?  Yes    Pain Score  5     Pain Location  Back    Pain Orientation  Right;Left    Pain Descriptors / Indicators  Aching;Burning;Sore                       [facility] Adult PT Treatment/Exercise - 08/26/19 0001      Exercises   Exercises  Lumbar;Knee/Hip      Lumbar Exercises: Stretches   Active Hamstring Stretch  Left;Right;3 reps;20 seconds      Lumbar Exercises: Supine   Ab Set  20 reps    AB Set Limitations  bracing with marching      Lumbar Exercises: Sidelying   Clam  Both;20 reps    Clam Limitations  abdominal bracing       Modalities   Modalities  Electrical Stimulation;Moist Heat      Moist Heat Therapy   Number Minutes Moist Heat  15 Minutes    Moist Heat Location  Lumbar Spine      Electrical Stimulation   Electrical Stimulation Location  lumbar and gluteals    Electrical Stimulation Action  IFC    Electrical Stimulation Parameters  15 minutes    Electrical Stimulation Goals  Pain      Manual Therapy   Manual Therapy  Soft tissue mobilization    Manual therapy comments  Addaday to Rt gluteals and lumbar paraspinals x10             PT Education - 08/26/19 1206    Education Details  Access Code: [email or web address]    Person(s) Educated  Patient    Methods  Explanation;Demonstration;Handout    Comprehension  Verbalized understanding;Returned demonstration       PT Short Term Goals - 08/24/19 1153      PT SHORT TERM GOAL #1   Title  be indpendent in initial HEP    Time  4    Period  Weeks    Status  New    Target Date  09/21/19      PT SHORT TERM GOAL #2   Title  report a 25% improvement in the quality and quantity of sleep    Time  4    Period  Weeks    Status  New    Target  Date  09/21/19      PT SHORT TERM GOAL #3   Title  report a 25% reduction in LBP and Rt LE radiculopathy with home and work tasks    Time  4    Period  Weeks    Status  New    Target Date  09/21/19        PT Long Term Goals - 08/24/19 1153      PT LONG TERM GOAL #1   Title  be independent in advanced HEP    Time  8    Period  Weeks    Status  New    Target Date  10/19/19      PT LONG TERM GOAL #2   Title  reduce FOTO to < or = to 45% limitation    Time  8    Period  Weeks    Status  New    Target Date  10/19/19      PT LONG TERM GOAL #3   Title  reduce pain to allow pt to walk for > or = to 1 hour without limitation in the community    Time  8    Period  Weeks    Status  New    Target Date  10/19/19      PT LONG TERM GOAL #4   Title  report a 50% improvement in the quality and quantity of sleep due to reduce pain at night    Time  8    Period  Weeks    Status  New    Target Date  10/19/19      PT LONG TERM GOAL #5   Title  report a 50% reduction in LBP and Rt LE pain with home and work tasks    Time  8    Period  Weeks    Status  New    Target Date  10/19/19            Plan - 08/26/19 1211    Clinical Impression Statement  Pt with first time follow-up after evaluation.  Pt wants to wait until next session to try dry needling.  Session focused  on initiating core activating exercises.  Pt was able to demonstrated all aspects correctly with fatigue expressed after completing these exercises.  Pt responded well to heat and electrical stimulation post session.  PT has recommended home TENs and order sent to MD.  Pt will continue to benefit from skilled PT to address Rt>Lt lumbar pain, core weakness and flexibility.    Rehab Potential  Good    PT Frequency  2x / week    PT Duration  8 weeks    PT Treatment/Interventions  ADLs/Self Care Home Management;Cryotherapy;Electrical Stimulation;Traction;Moist Heat;Iontophoresis 4mg /ml Dexamethasone;Stair training;Gait  training;Functional mobility training;Therapeutic activities;Therapeutic exercise;Neuromuscular re-education;Manual techniques;Patient/family education;Passive range of motion;Dry needling;Taping    PT Next Visit Plan  continue TENs, review core strength, manual and dry needling    PT Home Exercise Plan  Access Code: [email or web address]    Consulted and Agree with Plan of Care  Patient       Patient will benefit from skilled therapeutic intervention in order to improve the following deficits and impairments:  Abnormal gait, Decreased activity tolerance, Decreased mobility, Decreased endurance, Decreased range of motion, Decreased strength, Difficulty walking, Increased muscle spasms, Impaired flexibility  Visit Diagnosis: Chronic right-sided low back pain with right-sided sciatica  Cramp and spasm  Muscle weakness (generalized)     Problem List Patient Active Problem List   Diagnosis Date Noted  . Allergic rhinitis 05/11/2019  . Insomnia 08/24/2017  . Chronic back pain 10/16/2016  . 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 Eczema 05/09/2015  . Anxiety 05/09/2015  . Depression, recurrent ([place]) 03/08/2015  . Spinal stenosis of lumbar region 09/01/2014    Sigurd Sos, PT 08/26/19 12:31 PM  [facility] [address], [place], [place], [address] Phone: [phone]   Fax:  [phone]  Name: Murna Peace MRN: [id number] Date of Birth: [birth year]

## 2019-08-26 NOTE — Patient Instructions (Addendum)
Trigger Point Dry Needling  . What is Trigger Point Dry Needling (DN)? o DN is a physical therapy technique used to treat muscle pain and dysfunction. Specifically, DN helps deactivate muscle trigger points (muscle knots).  o A thin filiform needle is used to penetrate the skin and stimulate the underlying trigger point. The goal is for a local twitch response (LTR) to occur and for the trigger point to relax. No medication of any kind is injected during the procedure.   . What Does Trigger Point Dry Needling Feel Like?  o The procedure feels different for each individual patient. Some patients report that they do not actually feel the needle enter the skin and overall the process is not painful. Very mild bleeding may occur. However, many patients feel a deep cramping in the muscle in which the needle was inserted. This is the local twitch response.   Marland Kitchen How Will I feel after the treatment? o Soreness is normal, and the onset of soreness may not occur for a few hours. Typically this soreness does not last longer than two days.  o Bruising is uncommon, however; ice can be used to decrease any possible bruising.  o In rare cases feeling tired or nauseous after the treatment is normal. In addition, your symptoms may get worse before they get better, this period will typically not last longer than 24 hours.   . What Can I do After My Treatment? o Increase your hydration by drinking more water for the next 24 hours. o You may place ice or heat on the areas treated that have become sore, however, do not use heat on inflamed or bruised areas. Heat often brings more relief post needling. o You can continue your regular activities, but vigorous activity is not recommended initially after the treatment for 24 hours. o DN is best combined with other physical therapy such as strengthening, stretching, and other therapies.    [facility] [address] [place], [place]  [address] Phone # [phone] Fax [phone]  Access Code: [email or web address]  URL: [email or web address]  Date: 08/26/2019  Prepared by: Sigurd Sos   Exercises: Clamshell - 10 reps - 2 sets - 2x daily - 7x weekly Supine Transversus Abdominis Bracing - Hands on Stomach - 10 reps - 1 sets - 5 hold - 3x daily - 7x weekly Seated Transversus Abdominis Bracing - 10 reps - 3 sets - 1x daily - 7x weekly Supine March - 10 reps - 2 sets - 2x daily - 7x weekly

## 2019-08-27 ENCOUNTER — Ambulatory Visit
Admission: RE | Admit: 2019-08-27 | Discharge: 2019-08-27 | Disposition: A | Discharge status: Home / Self Care | Payer: 59 | Source: Ambulatory Visit | Attending: Family Medicine | Admitting: Family Medicine

## 2019-08-27 DIAGNOSIS — S62646A Nondisplaced fracture of proximal phalanx of right little finger, initial encounter for closed fracture: Secondary | ICD-10-CM | POA: Diagnosis not present

## 2019-08-27 DIAGNOSIS — M79644 Pain in right finger(s): Secondary | ICD-10-CM | POA: Diagnosis not present

## 2019-08-27 DIAGNOSIS — Z1231 Encounter for screening mammogram for malignant neoplasm of breast: Secondary | ICD-10-CM | POA: Diagnosis not present

## 2019-08-27 DIAGNOSIS — S6991XA Unspecified injury of right wrist, hand and finger(s), initial encounter: Secondary | ICD-10-CM | POA: Diagnosis not present

## 2019-08-31 ENCOUNTER — Ambulatory Visit: Payer: PRIVATE HEALTH INSURANCE | Admitting: Physical Therapy

## 2019-08-31 ENCOUNTER — Encounter (HOSPITAL_COMMUNITY): Payer: Self-pay | Admitting: Family Medicine

## 2019-08-31 ENCOUNTER — Emergency Department (HOSPITAL_COMMUNITY): Payer: 59

## 2019-08-31 ENCOUNTER — Other Ambulatory Visit: Payer: Self-pay

## 2019-08-31 ENCOUNTER — Emergency Department (HOSPITAL_COMMUNITY)
Admission: EM | Admit: 2019-08-31 | Discharge: 2019-08-31 | Disposition: A | Discharge status: Home / Self Care | Payer: 59 | Attending: Emergency Medicine | Admitting: Emergency Medicine

## 2019-08-31 ENCOUNTER — Encounter: Payer: Self-pay | Admitting: Physical Therapy

## 2019-08-31 DIAGNOSIS — M79604 Pain in right leg: Secondary | ICD-10-CM | POA: Diagnosis not present

## 2019-08-31 DIAGNOSIS — M546 Pain in thoracic spine: Secondary | ICD-10-CM | POA: Diagnosis not present

## 2019-08-31 DIAGNOSIS — Z041 Encounter for examination and observation following transport accident: Secondary | ICD-10-CM | POA: Insufficient documentation

## 2019-08-31 DIAGNOSIS — G8929 Other chronic pain: Secondary | ICD-10-CM

## 2019-08-31 DIAGNOSIS — S3992XA Unspecified injury of lower back, initial encounter: Secondary | ICD-10-CM | POA: Diagnosis not present

## 2019-08-31 DIAGNOSIS — R252 Cramp and spasm: Secondary | ICD-10-CM

## 2019-08-31 DIAGNOSIS — Z5321 Procedure and treatment not carried out due to patient leaving prior to being seen by health care provider: Secondary | ICD-10-CM | POA: Diagnosis not present

## 2019-08-31 DIAGNOSIS — M6281 Muscle weakness (generalized): Secondary | ICD-10-CM

## 2019-08-31 DIAGNOSIS — R52 Pain, unspecified: Secondary | ICD-10-CM | POA: Diagnosis not present

## 2019-08-31 DIAGNOSIS — M545 Low back pain: Secondary | ICD-10-CM | POA: Diagnosis not present

## 2019-08-31 MED ORDER — OXYCODONE HCL 5 MG PO TABS: 15.0000 mg | ORAL_TABLET | Freq: Once | ORAL | Status: DC

## 2019-08-31 NOTE — ED Notes (Signed)
Pt not seen in lobby when called for vitals.

## 2019-08-31 NOTE — Therapy (Signed)
[facility] [address], [place], [place], [address] Phone: [phone]   Fax:  [phone]  Physical Therapy Treatment  Patient Details  Name: Jamie [name] MRN: [id number] Date of Birth: [birth year] Referring Provider (PT): Phylliss Bob, MD   Encounter Date: 08/31/2019  PT End of Session - 08/31/19 1149    Visit Number  3    Date for PT Re-Evaluation  10/19/19    Authorization Type  Workers Comp    Authorization - Visit Number  3    Authorization - Number of Visits  8    PT Start Time  1149   a little late   PT Stop Time  1229    PT Time Calculation (min)  40 min    Activity Tolerance  Patient tolerated treatment well    Behavior During Therapy  [facility] for tasks assessed/performed       Past Medical History:  Diagnosis Date  . Anxiety    takes Ativan daily.  Panic Attack  . Arthritis   . Chronic back pain    herniated disc/stenosis/scoliosis  . Depression    takes Effexor daily  . Eczema    uses a cream daily as needed  . Family history of adverse reaction to anesthesia    pta dad is very hard to wake up;excessive nausea  . History of bronchitis    > 33yr ago  . Hypoglycemia   . Insomnia    takes Lorazepam nightly  . Joint pain   . PONV (postoperative nausea and vomiting)   . Seasonal allergies    uses Flonase daily  . Vitamin D deficiency    takes Vit D daily    Past Surgical History:  Procedure Laterality Date  . BACK SURGERY  2008/2012   laminectomy 1st time and 2nd time laminectomy and fusion  . MAXIMUM ACCESS (MAS)POSTERIOR LUMBAR INTERBODY FUSION (PLIF) 2 LEVEL N/A 09/01/2014   Procedure: Lumbar four-five, Lumbar five-Sacral one Maximum Access Surgery posterior lumbar interbody fusion with interbody prosthesis posterior lateral arthrodesis posterior segmental instrumentation;  Surgeon: Elaina Hoops, MD;  Location: [place] [facility];  Service: Neurosurgery;  Laterality: N/A;  Lumbar four-five,  Lumbar five-Sacral one Maximum Access Surgery posterior lumbar interbody fusion with i    There were no vitals filed for this visit.  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 My pain is 5/10 which for me is not "that bad."    Pertinent History  Lumbar surgery: 2008 & 2012 lumbar laminectomy, PLIF 08/2014 & 07/2015.  Fall at work on steps: 04/2019.    Currently in Pain?  Yes    Pain Score  5     Pain Location  Back    Pain Orientation  Right;Left;Lower   Mostly in her back right now   Aggravating Factors   sleeping, sitting too long and walking for too long    Pain Relieving Factors  heat, pain meds                       [facility] Adult PT Treatment/Exercise - 08/31/19 0001      Lumbar Exercises: Stretches   Piriformis Stretch  Right;3 reps;30 seconds    Piriformis Stretch Limitations  Rt hip release series    Other Lumbar Stretch Exercise  Leg lengther stretch in supine 5 sec on each side 3x      Lumbar  Exercises: Supine   Other Supine Lumbar Exercises  Diaphragmatic breathing with focus on relaxing thorugh her back 2 min      Moist Heat Therapy   Number Minutes Moist Heat  --   during supine exercises     Manual Therapy   Manual Therapy  Soft tissue mobilization    Manual therapy comments  Addaday to Rt gluteals and hip x10             PT Education - 08/31/19 1211    Education Details  Added supine leg lengthener stretch and single leg clamshells for additional HEP.    Person(s) Educated  Patient    Methods  Explanation;Demonstration;Tactile cues;Verbal cues;Handout   Medbridge video only   Comprehension  Returned demonstration;Verbalized understanding       PT Short Term Goals - 08/24/19 1153      PT SHORT TERM GOAL #1   Title  be indpendent in initial HEP    Time  4    Period  Weeks    Status  New    Target Date  09/21/19      PT SHORT TERM GOAL #2   Title  report a 25% improvement in  the quality and quantity of sleep    Time  4    Period  Weeks    Status  New    Target Date  09/21/19      PT SHORT TERM GOAL #3   Title  report a 25% reduction in LBP and Rt LE radiculopathy with home and work tasks    Time  4    Period  Weeks    Status  New    Target Date  09/21/19        PT Long Term Goals - 08/24/19 1153      PT LONG TERM GOAL #1   Title  be independent in advanced HEP    Time  8    Period  Weeks    Status  New    Target Date  10/19/19      PT LONG TERM GOAL #2   Title  reduce FOTO to < or = to 45% limitation    Time  8    Period  Weeks    Status  New    Target Date  10/19/19      PT LONG TERM GOAL #3   Title  reduce pain to allow pt to walk for > or = to 1 hour without limitation in the community    Time  8    Period  Weeks    Status  New    Target Date  10/19/19      PT LONG TERM GOAL #4   Title  report a 50% improvement in the quality and quantity of sleep due to reduce pain at night    Time  8    Period  Weeks    Status  New    Target Date  10/19/19      PT LONG TERM GOAL #5   Title  report a 50% reduction in LBP and Rt LE pain with home and work tasks    Time  8    Period  Weeks    Status  New    Target Date  10/19/19            Plan - 08/31/19 1150    Clinical Impression Statement  Pt arrived to PT a little late due to being busy  at work and some traffic. She reports her back pain is "not bad for me" rating it a 5/10 today with no leg symptoms. She is compliant with her initial HEP in addition to "doing my old stretches." Today we added a few exercises to her HEP that focused again on core stabilization and stretching to the RTLE. She declined visual handouts in order to motivate her to use the videos on the APP.    Personal Factors and Comorbidities  Comorbidity 2    Comorbidities  history of 4 lumbar surgeries, Rt hand injury this week    Examination-Activity Limitations  Bed Mobility;Locomotion Level;Sit;Stand     Examination-Participation Restrictions  Community Activity;Shop;Meal Prep    Stability/Clinical Decision Making  Evolving/Moderate complexity    Rehab Potential  Good    PT Frequency  2x / week    PT Duration  8 weeks    PT Treatment/Interventions  ADLs/Self Care Home Management;Cryotherapy;Electrical Stimulation;Traction;Moist Heat;Iontophoresis 4mg /ml Dexamethasone;Stair training;Gait training;Functional mobility training;Therapeutic activities;Therapeutic exercise;Neuromuscular re-education;Manual techniques;Patient/family education;Passive range of motion;Dry needling;Taping    PT Next Visit Plan  Progress with core stabilization, Addaday or other manual techniques to RT hip/gluteals. Consider adding band to clamshells for hip strength.    PT Home Exercise Plan  Access Code: [email or web address]    Consulted and Agree with Plan of Care  Patient       Patient will benefit from skilled therapeutic intervention in order to improve the following deficits and impairments:  Abnormal gait, Decreased activity tolerance, Decreased mobility, Decreased endurance, Decreased range of motion, Decreased strength, Difficulty walking, Increased muscle spasms, Impaired flexibility  Visit Diagnosis: Chronic right-sided low back pain with right-sided sciatica  Cramp and spasm  Muscle weakness (generalized)     Problem List Patient Active Problem List   Diagnosis Date Noted  . Allergic rhinitis 05/11/2019  . Insomnia 08/24/2017  . Chronic back pain 10/16/2016  . 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 Eczema 05/09/2015  . Anxiety 05/09/2015  . Depression, recurrent ([place]) 03/08/2015  . Spinal stenosis of lumbar region 09/01/2014    Goldie Dimmer, PTA 08/31/2019, 3:32 PM  [facility] [address], [place], [place], [address] Phone: [phone]   Fax:  [phone]  Name: Jamie [name] MRN: [id number] Date of Birth:  1971/03/31  Access Code: [email or web address]  URL: [email or web address]  Date: 08/31/2019  Prepared by: Myrene Galas   Exercises  Seated Hamstring Stretch - 3 reps - 20 hold - 3x daily - 7x weekly  Seated Piriformis Stretch with Trunk Bend - 3 reps - 20 hold - 3x daily - 7x weekly  Clamshell - 10 reps - 2 sets - 2x daily - 7x weekly  Supine Transversus Abdominis Bracing - Hands on Stomach - 10 reps - 1 sets - 5 hold - 3x daily - 7x weekly  Seated Transversus Abdominis Bracing - 10 reps - 3 sets - 1x daily - 7x weekly  Supine March - 10 reps - 2 sets - 2x daily - 7x weekly  Bent Knee Fallouts - 10 reps - 1 sets - 2x daily - 7x weekly
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
## 2019-09-02 ENCOUNTER — Ambulatory Visit: Payer: PRIVATE HEALTH INSURANCE

## 2019-09-02 ENCOUNTER — Other Ambulatory Visit: Payer: Self-pay | Admitting: Family Medicine

## 2019-09-02 MED ORDER — CLONAZEPAM 0.5 MG PO TABS: 0.5000 mg | ORAL_TABLET | Freq: Two times a day (BID) | ORAL | 0 refills | Status: DC

## 2019-09-02 NOTE — Telephone Encounter (Signed)
Sent in refill. Looks like referral was sent to [facility] 07/30/2019 so she should be able to call over there and try to schedule.

## 2019-09-03 NOTE — Telephone Encounter (Signed)
Pt understood and verbalized understanding.  

## 2019-09-04 DIAGNOSIS — M79644 Pain in right finger(s): Secondary | ICD-10-CM | POA: Diagnosis not present

## 2019-09-04 DIAGNOSIS — S6991XD Unspecified injury of right wrist, hand and finger(s), subsequent encounter: Secondary | ICD-10-CM | POA: Diagnosis not present

## 2019-09-04 DIAGNOSIS — S62646D Nondisplaced fracture of proximal phalanx of right little finger, subsequent encounter for fracture with routine healing: Secondary | ICD-10-CM | POA: Diagnosis not present

## 2019-09-07 ENCOUNTER — Encounter: Payer: Self-pay | Admitting: Physical Therapy

## 2019-09-07 ENCOUNTER — Other Ambulatory Visit: Payer: Self-pay

## 2019-09-07 ENCOUNTER — Ambulatory Visit: Payer: PRIVATE HEALTH INSURANCE | Admitting: Physical Therapy

## 2019-09-07 DIAGNOSIS — M6281 Muscle weakness (generalized): Secondary | ICD-10-CM

## 2019-09-07 DIAGNOSIS — M79604 Pain in right leg: Secondary | ICD-10-CM | POA: Diagnosis not present

## 2019-09-07 DIAGNOSIS — R252 Cramp and spasm: Secondary | ICD-10-CM

## 2019-09-07 DIAGNOSIS — G8929 Other chronic pain: Secondary | ICD-10-CM
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
## 2019-09-09 ENCOUNTER — Ambulatory Visit: Payer: PRIVATE HEALTH INSURANCE

## 2019-09-09 ENCOUNTER — Other Ambulatory Visit: Payer: Self-pay

## 2019-09-09 DIAGNOSIS — M79604 Pain in right leg: Secondary | ICD-10-CM | POA: Diagnosis not present

## 2019-09-09 DIAGNOSIS — M6281 Muscle weakness (generalized): Secondary | ICD-10-CM

## 2019-09-09 DIAGNOSIS — R252 Cramp and spasm: Secondary | ICD-10-CM

## 2019-09-09 DIAGNOSIS — G8929 Other chronic pain: Secondary | ICD-10-CM

## 2019-09-09 NOTE — Patient Instructions (Signed)
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
## 2019-09-10 DIAGNOSIS — G8929 Other chronic pain: Secondary | ICD-10-CM | POA: Diagnosis not present

## 2019-09-10 DIAGNOSIS — G894 Chronic pain syndrome: Secondary | ICD-10-CM | POA: Diagnosis not present

## 2019-09-10 DIAGNOSIS — M545 Low back pain: Secondary | ICD-10-CM | POA: Diagnosis not present

## 2019-09-14 ENCOUNTER — Ambulatory Visit: Payer: PRIVATE HEALTH INSURANCE

## 2019-09-14 ENCOUNTER — Encounter
Payer: PRIVATE HEALTH INSURANCE | Attending: Physical Medicine & Rehabilitation | Admitting: Physical Medicine & Rehabilitation

## 2019-09-14 ENCOUNTER — Telehealth: Payer: Self-pay

## 2019-09-14 ENCOUNTER — Other Ambulatory Visit: Payer: Self-pay

## 2019-09-14 ENCOUNTER — Encounter: Payer: Self-pay | Admitting: Physical Medicine & Rehabilitation

## 2019-09-14 VITALS — BP 118/78 | HR 88 | Temp 97.7°F | Ht 67.0 in | Wt 158.0 lb

## 2019-09-14 DIAGNOSIS — M5431 Sciatica, right side: Secondary | ICD-10-CM | POA: Diagnosis not present

## 2019-09-14 DIAGNOSIS — M79604 Pain in right leg: Secondary | ICD-10-CM | POA: Insufficient documentation

## 2019-09-14 DIAGNOSIS — M25552 Pain in left hip: Secondary | ICD-10-CM | POA: Diagnosis not present

## 2019-09-14 DIAGNOSIS — M25551 Pain in right hip: Secondary | ICD-10-CM | POA: Diagnosis not present

## 2019-09-14 DIAGNOSIS — M545 Low back pain: Secondary | ICD-10-CM | POA: Diagnosis not present

## 2019-09-14 NOTE — Telephone Encounter (Signed)
PT called pt due to no-show appointment today.  Left message on voicemail  

## 2019-09-14 NOTE — Progress Notes (Signed)
49 year old female with L3-S1 fusion who presents with increased pain in the right lower extremity after a fall in September. The patient has had a little numbness on the lateral border of the thigh on the right side.  She has chronic numbness in the left anterior thigh. She is referred for EMG/NCV to further evaluate symptomatology. Examination reveals mildly reduced pinprick right lateral thigh as well as left anterior thigh as well as bilateral L4-L5 S1 decreased sensation to pinprick in both feet. Motor strength is normal in the left lower extremity mildly reduced in the right lower extremity Negative straight leg raising  EMG/NCV for both performed.  NCV studies including fibular nerve motor, sensory Sural sensory Order conductions with F waves Bilateral H reflexes were all normal Right lower extremity EMG demonstrates no evidence of denervation in the vastus medialis short head of the biceps femoris, TA, gastroc and posterior tibialis.  As expected there is evidence of chronic denervation in the lumbar paraspinals right L4 and right L5, extensive scarring from multiple back surgeries  Impression: Normal nerve conduction studies of both lower limbs. The abnormalities in the paraspinal muscles are typical of patients who has had lumbar spine surgery especially those with several lumbar surgeries. No electrodiagnostic evidence of peripheral neuropathy, lumbar plexopathy or lumbar radiculopathy in the right lower extremity  Full study as listed in media

## 2019-09-14 NOTE — Patient Instructions (Signed)
The EMG report will be sent to Dr Lynann Bologna who will discuss the results with you.

## 2019-09-16 ENCOUNTER — Other Ambulatory Visit: Payer: Self-pay

## 2019-09-16 ENCOUNTER — Ambulatory Visit: Payer: PRIVATE HEALTH INSURANCE

## 2019-09-16 DIAGNOSIS — M6281 Muscle weakness (generalized): Secondary | ICD-10-CM

## 2019-09-16 DIAGNOSIS — M79604 Pain in right leg: Secondary | ICD-10-CM | POA: Diagnosis not present

## 2019-09-16 DIAGNOSIS — G8929 Other chronic pain: Secondary | ICD-10-CM

## 2019-09-16 DIAGNOSIS — R252 Cramp and spasm: Secondary | ICD-10-CM
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
## 2019-09-21 DIAGNOSIS — M5431 Sciatica, right side: Secondary | ICD-10-CM | POA: Diagnosis not present

## 2019-09-21 DIAGNOSIS — S62646D Nondisplaced fracture of proximal phalanx of right little finger, subsequent encounter for fracture with routine healing: Secondary | ICD-10-CM | POA: Diagnosis not present

## 2019-09-21 DIAGNOSIS — S6991XD Unspecified injury of right wrist, hand and finger(s), subsequent encounter: Secondary | ICD-10-CM | POA: Diagnosis not present

## 2019-09-21 DIAGNOSIS — M25552 Pain in left hip: Secondary | ICD-10-CM | POA: Diagnosis not present

## 2019-09-21 DIAGNOSIS — M545 Low back pain: Secondary | ICD-10-CM | POA: Diagnosis not present

## 2019-09-21 DIAGNOSIS — M25551 Pain in right hip: Secondary | ICD-10-CM | POA: Diagnosis not present

## 2019-09-21 MED FILL — MORPHINE SULF ER 30 MG TAB: 30 | 30 days supply | Qty: 30 | Fill #0

## 2019-09-21 MED FILL — METAXALONE 800 MG TABLET: 800 | 30 days supply | Qty: 90 | Fill #2

## 2019-09-21 MED FILL — oxyCODONE HCL 15 MG TABS: 15 | 30 days supply | Qty: 120 | Fill #0

## 2019-09-24 DIAGNOSIS — M960 Pseudarthrosis after fusion or arthrodesis: Secondary | ICD-10-CM | POA: Diagnosis not present

## 2019-10-07 ENCOUNTER — Other Ambulatory Visit: Payer: Self-pay | Admitting: Family Medicine

## 2019-10-07 NOTE — Telephone Encounter (Signed)
Routing to provider. Patient's referral was sent to [facility] 07/30/19. Tried to call to check the status and had to leave a VM.

## 2019-10-08 MED ORDER — CLONAZEPAM 0.5 MG PO TABS: 0.5000 mg | ORAL_TABLET | Freq: Two times a day (BID) | ORAL | 0 refills | Status: DC

## 2019-10-08 NOTE — Telephone Encounter (Signed)
Refilled medication, please let pt know to just call [facility] to schedule. Not sure about the status of communications as I don't have access to that system but they should have her info

## 2019-10-14 MED FILL — diazePAM 5 MG TABS: 5 | 1 days supply | Qty: 2 | Fill #0

## 2019-10-16 DIAGNOSIS — M76891 Other specified enthesopathies of right lower limb, excluding foot: Secondary | ICD-10-CM | POA: Diagnosis not present

## 2019-10-16 DIAGNOSIS — M25561 Pain in right knee: Secondary | ICD-10-CM | POA: Diagnosis not present

## 2019-10-16 DIAGNOSIS — M25861 Other specified joint disorders, right knee: Secondary | ICD-10-CM | POA: Diagnosis not present

## 2019-10-16 DIAGNOSIS — G8929 Other chronic pain: Secondary | ICD-10-CM | POA: Diagnosis not present

## 2019-10-16 DIAGNOSIS — M1711 Unilateral primary osteoarthritis, right knee: Secondary | ICD-10-CM | POA: Diagnosis not present

## 2019-10-20 MED FILL — MORPHINE SULF ER 30 MG TAB: 30 | 30 days supply | Qty: 30 | Fill #0

## 2019-10-20 MED FILL — oxyCODONE HCL 15 MG TABS: 15 | 7 days supply | Qty: 28 | Fill #0

## 2019-10-26 DIAGNOSIS — M5136 Other intervertebral disc degeneration, lumbar region: Secondary | ICD-10-CM | POA: Diagnosis not present

## 2019-10-26 DIAGNOSIS — M5440 Lumbago with sciatica, unspecified side: Secondary | ICD-10-CM | POA: Diagnosis not present

## 2019-10-26 DIAGNOSIS — R030 Elevated blood-pressure reading, without diagnosis of hypertension: Secondary | ICD-10-CM | POA: Insufficient documentation

## 2019-10-26 MED FILL — GABAPENTIN 600 MG TABLET: 600 | 30 days supply | Qty: 90 | Fill #0

## 2019-10-26 MED FILL — MELOXICAM 7.5 MG TABLET: 7.5 | 30 days supply | Qty: 60 | Fill #0

## 2019-10-26 MED FILL — METAXALONE 800 MG TABLET: 800 | 30 days supply | Qty: 90 | Fill #0

## 2019-10-27 MED FILL — oxyCODONE HCL 15 MG TABS: 15 | 30 days supply | Qty: 120 | Fill #0

## 2019-11-02 ENCOUNTER — Telehealth: Payer: Self-pay

## 2019-11-02 NOTE — Telephone Encounter (Signed)
PT called pt due to no more PT appointments scheduled.  Left message for pt to return call.

## 2019-11-04 ENCOUNTER — Encounter: Payer: Self-pay | Admitting: Family Medicine

## 2019-11-04 ENCOUNTER — Ambulatory Visit: Payer: 59 | Admitting: Family Medicine

## 2019-11-04 ENCOUNTER — Telehealth (INDEPENDENT_AMBULATORY_CARE_PROVIDER_SITE_OTHER): Payer: 59 | Admitting: Family Medicine

## 2019-11-04 VITALS — Ht 67.0 in | Wt 175.0 lb

## 2019-11-04 DIAGNOSIS — Z1211 Encounter for screening for malignant neoplasm of colon: Secondary | ICD-10-CM | POA: Diagnosis not present

## 2019-11-04 DIAGNOSIS — G4700 Insomnia, unspecified: Secondary | ICD-10-CM

## 2019-11-04 DIAGNOSIS — F339 Major depressive disorder, recurrent, unspecified: Secondary | ICD-10-CM

## 2019-11-04 DIAGNOSIS — J3089 Other allergic rhinitis: Secondary | ICD-10-CM | POA: Diagnosis not present

## 2019-11-04 DIAGNOSIS — F419 Anxiety disorder, unspecified: Secondary | ICD-10-CM | POA: Diagnosis not present

## 2019-11-04 MED ORDER — EUCRISA 2 % EX OINT: 1.0000 "application " | TOPICAL_OINTMENT | Freq: Two times a day (BID) | CUTANEOUS | 6 refills | Status: DC | PRN

## 2019-11-04 MED ORDER — CLONAZEPAM 0.5 MG PO TABS: 0.5000 mg | ORAL_TABLET | Freq: Two times a day (BID) | ORAL | 2 refills | Status: DC | PRN

## 2019-11-04 MED ORDER — QUETIAPINE FUMARATE 50 MG PO TABS: 50.0000 mg | ORAL_TABLET | Freq: Every day | ORAL | 1 refills | Status: DC

## 2019-11-04 MED ORDER — TRIAMCINOLONE ACETONIDE 0.1 % EX CREA: 1.0000 "application " | TOPICAL_CREAM | Freq: Two times a day (BID) | CUTANEOUS | 6 refills | Status: DC

## 2019-11-04 NOTE — Assessment & Plan Note (Signed)
Stable and well controlled on seroquel, continue current regien and sleep hygiene

## 2019-11-04 NOTE — Progress Notes (Signed)
Ht 5\' 7"  (1.702 m)   Wt 175 lb (79.4 kg)   LMP 06/20/2016 (Approximate)   BMI 27.41 kg/m    Subjective:    Patient ID: Jamie [name], female    DOB: September 23, 1970, 49 y.o.   MRN: [id number]  HPI: Jamie [name] is a 49 y.o. female  Chief Complaint  Patient presents with  . Anxiety    . This visit was completed via MyChart due to the restrictions of the COVID-19 pandemic. All issues as above were discussed and addressed. Physical exam was done as above through visual confirmation on MyChart. If it was felt that the patient should be evaluated in the office, they were directed there. The patient verbally consented to this visit. . Location of the patient: parked car . Location of the provider: work . Those involved with this call:  . Provider: Merrie Roof, PA-C . CMA: Lesle Chris, [place] . Front Desk/Registration: Jill Side  . Time spent on call: 25 minutes with patient face to face via video conference. More than 50% of this time was spent in counseling and coordination of care. 5 minutes total spent in review of patient's record and preparation of their chart. I verified patient identity using two factors (patient name and date of birth). Patient consents verbally to being seen via telemedicine visit today.   Presenting today for anxiety and depression follow up. 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 Has not heard from Psychiatrist she was referred to and counseling has been tough to get in with. Taking buspar TID, seroquel at bedtime, and klonopin BID prn. Tries to limit the klonopin but having lots of funerals lately for family members and the personal stressors. Denies SI/HI.  Depression screen [facility] 2/9 11/04/2019 09/14/2019 07/24/2019  Decreased Interest 2 1 2   Down, Depressed, Hopeless 3 1 3   PHQ - 2 Score 5 2 5   Altered sleeping 3 3 3   Tired, decreased energy 2 3 2   Change in appetite 3 3 3   Feeling bad or failure about yourself  2 3 2    Trouble concentrating 3 2 3   Moving slowly or fidgety/restless 3 3 1   Suicidal thoughts 0 0 0  PHQ-9 Score 21 19 19   Difficult doing work/chores Somewhat difficult - Somewhat difficult  Some recent data might be hidden   GAD 7 : Generalized Anxiety Score 11/04/2019 07/24/2019 06/30/2019 06/03/2019  Nervous, Anxious, on Edge 3 3 3 3   Control/stop worrying 3 3 3 3   Worry too much - different things 3 3 3 3   Trouble relaxing 3 3 3 3   Restless 3 3 3 3   Easily annoyed or irritable 3 3 2 2   Afraid - awful might happen 2 3 2 2   Total GAD 7 Score 20 21 19 19   Anxiety Difficulty Somewhat difficult Very difficult Somewhat difficult Somewhat difficult   Relevant past medical, surgical, family and social history reviewed and updated as indicated. Interim medical history since our last visit reviewed. Allergies and medications reviewed and updated.  Review of Systems  Per HPI unless specifically indicated above     Objective:    Ht 5\' 7"  (1.702 m)   Wt 175 lb (79.4 kg)   LMP 06/20/2016 (Approximate)   BMI 27.41 kg/m   Wt Readings from Last 3 Encounters:  11/04/19 175 lb (79.4 kg)  09/14/19 158 lb (71.7 kg)  08/31/19 155 lb (70.3 kg)    Physical Exam Vitals and nursing note reviewed.  Constitutional:  General: She is not in acute distress.    Appearance: Normal appearance.  HENT:     Head: Atraumatic.     Right Ear: External ear normal.     Left Ear: External ear normal.     Nose: Nose normal. No congestion.     Mouth/Throat:     Mouth: Mucous membranes are moist.     Pharynx: Oropharynx is clear. No posterior oropharyngeal erythema.  Eyes:     Extraocular Movements: Extraocular movements intact.     Conjunctiva/sclera: Conjunctivae normal.  Cardiovascular:     Comments: Unable to assess via virtual visit Pulmonary:     Effort: Pulmonary effort is normal. No respiratory distress.  Musculoskeletal:        General: Normal range of motion.     Cervical back: Normal  range of motion.  Skin:    General: Skin is dry.     Findings: No erythema.  Neurological:     Mental Status: She is alert and oriented to person, place, and time.  Psychiatric:        Thought Content: Thought content normal.        Judgment: Judgment normal.     Comments: tearful     Results for orders placed or performed during the hospital encounter of 06/02/19  APTT  Result Value Ref Range   aPTT 29 24 - 36 seconds  CBC  Result Value Ref Range   WBC 8.3 4.0 - 10.5 K/uL   RBC 4.26 3.87 - 5.11 MIL/uL   Hemoglobin 13.7 12.0 - 15.0 g/dL   HCT 40.6 36.0 - 46.0 %   MCV 95.3 80.0 - 100.0 fL   MCH 32.2 26.0 - 34.0 pg   MCHC 33.7 30.0 - 36.0 g/dL   RDW 12.8 11.5 - 15.5 %   Platelets 232 150 - 400 K/uL   nRBC 0.0 0.0 - 0.2 %  Protime-INR  Result Value Ref Range   Prothrombin Time 12.5 11.4 - 15.2 seconds   INR 0.9 0.8 - 1.2      Assessment & Plan:   Problem List Items Addressed This Visit      Respiratory   Allergic rhinitis     Other   Depression, recurrent ([facility])    Exacerbated by stressors, continue current regimen and work on getting in with Psychiatry and counseling.       Anxiety    Exacerbated by numerous stressors, continue current regimen and will work on getting her set up with Psychiatry and [place] counselors. Continue taking the klonopin as rarely as possible       Insomnia    Stable and well controlled on seroquel, continue current regien and sleep hygiene       Other Visit Diagnoses    Colon cancer screening    -  Primary   Relevant Orders   Ambulatory referral to Gastroenterology       Follow up plan: Return in about 3 months (around 02/04/2020) for Anxiety f/u if not already in with Psychiatry.

## 2019-11-04 NOTE — Assessment & Plan Note (Signed)
Exacerbated by numerous stressors, continue current regimen and will work on getting her set up with Psychiatry and [place] counselors. Continue taking the klonopin as rarely as possible

## 2019-11-04 NOTE — Assessment & Plan Note (Signed)
Exacerbated by stressors, continue current regimen and work on getting in with Psychiatry and counseling.

## 2019-11-23 ENCOUNTER — Telehealth: Payer: Self-pay | Admitting: General Practice

## 2019-11-23 ENCOUNTER — Other Ambulatory Visit: Payer: Self-pay | Admitting: Family Medicine

## 2019-11-23 MED ORDER — BUSPIRONE HCL 15 MG PO TABS: 15.0000 mg | ORAL_TABLET | Freq: Three times a day (TID) | ORAL | 1 refills | Status: DC

## 2019-11-23 MED FILL — MONTELUKAST SOD 10 MG TAB: 10 | 30 days supply | Qty: 30 | Fill #1

## 2019-11-23 NOTE — Telephone Encounter (Signed)
Requested Prescriptions  Pending Prescriptions Disp Refills  . busPIRone (BUSPAR) 15 MG tablet 90 tablet 1    Sig: Take 1 tablet (15 mg total) by mouth 3 (three) times daily.     Psychiatry: Anxiolytics/Hypnotics - Non-controlled Passed - 11/23/2019  1:47 PM      Passed - Valid encounter within last 6 months    Recent Outpatient Visits          2 weeks ago Colon cancer screening   [place], [place]   4 months ago Depression, recurrent [facility])   [facility] Merrie Roof [place], [place]   4 months ago [place], [place], [place]   5 months ago [place], [place]   6 months ago Chronic bilateral low back pain with bilateral sciatica   [facility] Guadalupe Maple, MD      Future Appointments            In 2 months Orene Desanctis, Lilia Argue, [place], [place]

## 2019-11-23 NOTE — Telephone Encounter (Signed)
RX REFILL busPIRone (BUSPAR) 15 MG tablet [place], [place] Phone:  [phone]  Fax:  [phone]

## 2019-11-23 NOTE — Telephone Encounter (Signed)
Pt called back to report that she contacted her pharmacy and they do not have her refill, please advise

## 2019-11-23 NOTE — Addendum Note (Signed)
Addended by: Dimple Nanas on: 11/23/2019 05:44 PM   Modules accepted: Orders

## 2019-11-23 NOTE — Addendum Note (Signed)
Addended by: Addison Naegeli on: 11/23/2019 01:50 PM   Modules accepted: Orders

## 2019-11-23 NOTE — Telephone Encounter (Signed)
Please review for refill. Attempted to resend refill electronically but prescription shows up as printed.

## 2019-11-24 NOTE — Telephone Encounter (Signed)
Pt understood and verbalized understanding.  

## 2019-11-24 NOTE — Telephone Encounter (Signed)
Medication called in to [facility].

## 2019-11-25 MED FILL — METAXALONE 800 MG TABLET: 800 | 30 days supply | Qty: 90 | Fill #1

## 2019-11-25 MED FILL — MORPHINE SULF ER 30 MG TAB: 30 | 30 days supply | Qty: 30 | Fill #0

## 2019-11-25 MED FILL — oxyCODONE HCL 15 MG TABS: 15 | 30 days supply | Qty: 120 | Fill #0

## 2019-11-28 ENCOUNTER — Ambulatory Visit (HOSPITAL_COMMUNITY): Payer: 59 | Admitting: Psychiatry

## 2019-12-24 MED FILL — MORPHINE SULF ER 30 MG TAB: 30 | 30 days supply | Qty: 30 | Fill #0

## 2019-12-24 MED FILL — oxyCODONE HCL 15 MG TABS: 15 | 30 days supply | Qty: 120 | Fill #0

## 2019-12-24 MED FILL — METAXALONE 800 MG TABLET: 800 | 30 days supply | Qty: 90 | Fill #2

## 2020-01-01 DIAGNOSIS — M25861 Other specified joint disorders, right knee: Secondary | ICD-10-CM | POA: Diagnosis not present

## 2020-01-01 DIAGNOSIS — M1711 Unilateral primary osteoarthritis, right knee: Secondary | ICD-10-CM | POA: Diagnosis not present

## 2020-01-01 DIAGNOSIS — M25561 Pain in right knee: Secondary | ICD-10-CM | POA: Diagnosis not present

## 2020-01-01 DIAGNOSIS — M76891 Other specified enthesopathies of right lower limb, excluding foot: Secondary | ICD-10-CM | POA: Diagnosis not present

## 2020-01-09 ENCOUNTER — Telehealth (HOSPITAL_COMMUNITY): Payer: 59 | Admitting: Psychiatry

## 2020-01-16 ENCOUNTER — Telehealth (HOSPITAL_COMMUNITY): Payer: 59 | Admitting: Psychiatry

## 2020-01-16 ENCOUNTER — Encounter: Payer: Self-pay | Admitting: Family Medicine

## 2020-01-16 ENCOUNTER — Telehealth (HOSPITAL_COMMUNITY): Payer: Self-pay | Admitting: Psychiatry

## 2020-01-16 ENCOUNTER — Other Ambulatory Visit: Payer: Self-pay

## 2020-01-16 NOTE — Telephone Encounter (Signed)
The patient did not join the scheduled video conference. I called and there was no answer. I was unable to speak with the patient today for our scheduled appointment.

## 2020-01-19 DIAGNOSIS — M25511 Pain in right shoulder: Secondary | ICD-10-CM | POA: Diagnosis not present

## 2020-01-23 MED FILL — oxyCODONE HCL 15 MG TABS: 15 | 30 days supply | Qty: 120 | Fill #0

## 2020-01-23 MED FILL — MORPHINE SULF ER 30 MG TAB: 30 | 30 days supply | Qty: 30 | Fill #0

## 2020-01-25 DIAGNOSIS — M25511 Pain in right shoulder: Secondary | ICD-10-CM | POA: Diagnosis not present

## 2020-01-25 DIAGNOSIS — M7551 Bursitis of right shoulder: Secondary | ICD-10-CM | POA: Diagnosis not present

## 2020-01-25 DIAGNOSIS — M7541 Impingement syndrome of right shoulder: Secondary | ICD-10-CM | POA: Diagnosis not present

## 2020-01-25 DIAGNOSIS — M778 Other enthesopathies, not elsewhere classified: Secondary | ICD-10-CM | POA: Diagnosis not present

## 2020-01-26 ENCOUNTER — Telehealth (INDEPENDENT_AMBULATORY_CARE_PROVIDER_SITE_OTHER): Payer: 59 | Admitting: Family Medicine

## 2020-01-26 ENCOUNTER — Encounter: Payer: Self-pay | Admitting: Family Medicine

## 2020-01-26 VITALS — Wt 158.0 lb

## 2020-01-26 DIAGNOSIS — F419 Anxiety disorder, unspecified: Secondary | ICD-10-CM

## 2020-01-26 DIAGNOSIS — F339 Major depressive disorder, recurrent, unspecified: Secondary | ICD-10-CM

## 2020-01-26 NOTE — Progress Notes (Signed)
Wt 158 lb (71.7 kg)   LMP 06/20/2016 (Approximate)   BMI 24.75 kg/m    Subjective:    Patient ID: Jamie [name], female    DOB: [birth year], 49 y.o.   MRN: [id number]  HPI: Jamie [name] is a 49 y.o. female  Chief Complaint  Patient presents with  . Anxiety  . Depression    . This visit was completed via MyChart due to the restrictions of the COVID-19 pandemic. All issues as above were discussed and addressed. Physical exam was done as above through visual confirmation on MyChart. If it was felt that the patient should be evaluated in the office, they were directed there. The patient verbally consented to this visit. . Location of the patient: work . Location of the provider: work . Those involved with this call:  . Provider: Merrie Roof, PA-C . CMA: Lesle Chris, [place] . Front Desk/Registration: Jill Side  . Time spent on call: 25 minutes with patient face to face via video conference. More than 50% of this time was spent in counseling and coordination of care. 5 minutes total spent in review of patient's record and preparation of their chart. I verified patient identity using two factors (patient name and date of birth). Patient consents verbally to being seen via telemedicine visit today.   Presenting today for follow up anxiety and depression. Has so far been unable to establish with Psychiatry, notes she's missed several appts due to one thing or another coming up but hopes to get there soon to establish. Having a really hard time still with personal and caregiving stressors going on. Does feel like the medications are helping. Having to take the klonopin twice daily pretty much ever day. Denies side effects with medications. Denies SI/HI.   Depression screen [facility] 2/9 01/26/2020 11/04/2019 09/14/2019  Decreased Interest 2 2 1   Down, Depressed, Hopeless 3 3 1   PHQ - 2 Score 5 5 2   Altered sleeping 3 3 3   Tired, decreased energy 2 2 3   Change in appetite 3 3 3   Feeling bad or  failure about yourself  2 2 3   Trouble concentrating 3 3 2   Moving slowly or fidgety/restless 2 3 3   Suicidal thoughts 0 0 0  PHQ-9 Score 20 21 19   Difficult doing work/chores Somewhat difficult Somewhat difficult -  Some recent data might be hidden   GAD 7 : Generalized Anxiety Score 01/26/2020 11/04/2019 07/24/2019 06/30/2019  Nervous, Anxious, on Edge 3 3 3 3   Control/stop worrying 3 3 3 3   Worry too much - different things 3 3 3 3   Trouble relaxing 3 3 3 3   Restless 3 3 3 3   Easily annoyed or irritable 3 3 3 2   Afraid - awful might happen 2 2 3 2   Total GAD 7 Score 20 20 21 19   Anxiety Difficulty Somewhat difficult Somewhat difficult Very difficult Somewhat difficult     Relevant past medical, surgical, family and social history reviewed and updated as indicated. Interim medical history since our last visit reviewed. Allergies and medications reviewed and updated.  Review of Systems  Per HPI unless specifically indicated above     Objective:    Wt 158 lb (71.7 kg)   LMP 06/20/2016 (Approximate)   BMI 24.75 kg/m   Wt Readings from Last 3 Encounters:  01/26/20 158 lb (71.7 kg)  11/04/19 175 lb (79.4 kg)  09/14/19 158 lb (71.7 kg)    Physical Exam Vitals and nursing note reviewed.  Constitutional:  General: She is not in acute distress.    Appearance: Normal appearance.  HENT:     Head: Atraumatic.     Right Ear: External ear normal.     Left Ear: External ear normal.     Nose: Nose normal. No congestion.     Mouth/Throat:     Mouth: Mucous membranes are moist.     Pharynx: Oropharynx is clear. No posterior oropharyngeal erythema.  Eyes:     Extraocular Movements: Extraocular movements intact.     Conjunctiva/sclera: Conjunctivae normal.  Cardiovascular:     Comments: Unable to assess via virtual visit Pulmonary:     Effort: Pulmonary effort is normal. No respiratory distress.  Musculoskeletal:        General: Normal range of motion.     Cervical back:  Normal range of motion.  Skin:    General: Skin is dry.     Findings: No erythema.  Neurological:     Mental Status: She is alert and oriented to person, place, and time.  Psychiatric:        Thought Content: Thought content normal.        Judgment: Judgment normal.     Comments: Tearful throughout visit     Results for orders placed or performed during the hospital encounter of 06/02/19  APTT  Result Value Ref Range   aPTT 29 24 - 36 seconds  CBC  Result Value Ref Range   WBC 8.3 4.0 - 10.5 K/uL   RBC 4.26 3.87 - 5.11 MIL/uL   Hemoglobin 13.7 12.0 - 15.0 g/dL   HCT 40.6 36 - 46 %   MCV 95.3 80.0 - 100.0 fL   MCH 32.2 26.0 - 34.0 pg   MCHC 33.7 30.0 - 36.0 g/dL   RDW 12.8 11.5 - 15.5 %   Platelets 232 150 - 400 K/uL   nRBC 0.0 0.0 - 0.2 %  Protime-INR  Result Value Ref Range   Prothrombin Time 12.5 11.4 - 15.2 seconds   INR 0.9 0.8 - 1.2      Assessment & Plan:   Problem List Items Addressed This Visit      Other   Depression, recurrent ([place])    Fairly well controlled, continue current regimen and establish with Psychiatry ASAP      Anxiety - Primary    Not under good control due to stressors, but medications helping. Continue current regimen and follow up to establish with Psychiatry ASAP. Pt will call to reschedule missed appts          Follow up plan: Return in about 3 months (around 04/27/2020) for 6 month f/u.

## 2020-01-27 DIAGNOSIS — M5136 Other intervertebral disc degeneration, lumbar region: Secondary | ICD-10-CM | POA: Diagnosis not present

## 2020-01-27 DIAGNOSIS — M5440 Lumbago with sciatica, unspecified side: Secondary | ICD-10-CM | POA: Diagnosis not present

## 2020-01-29 DIAGNOSIS — M6281 Muscle weakness (generalized): Secondary | ICD-10-CM | POA: Diagnosis not present

## 2020-01-29 DIAGNOSIS — M7541 Impingement syndrome of right shoulder: Secondary | ICD-10-CM | POA: Diagnosis not present

## 2020-01-29 DIAGNOSIS — M7551 Bursitis of right shoulder: Secondary | ICD-10-CM | POA: Diagnosis not present

## 2020-01-29 DIAGNOSIS — M25511 Pain in right shoulder: Secondary | ICD-10-CM | POA: Diagnosis not present

## 2020-01-29 DIAGNOSIS — M25611 Stiffness of right shoulder, not elsewhere classified: Secondary | ICD-10-CM | POA: Diagnosis not present

## 2020-01-29 DIAGNOSIS — M778 Other enthesopathies, not elsewhere classified: Secondary | ICD-10-CM | POA: Diagnosis not present

## 2020-02-01 DIAGNOSIS — M25611 Stiffness of right shoulder, not elsewhere classified: Secondary | ICD-10-CM | POA: Diagnosis not present

## 2020-02-01 DIAGNOSIS — M7541 Impingement syndrome of right shoulder: Secondary | ICD-10-CM | POA: Diagnosis not present

## 2020-02-01 DIAGNOSIS — M25511 Pain in right shoulder: Secondary | ICD-10-CM | POA: Diagnosis not present

## 2020-02-01 DIAGNOSIS — M6281 Muscle weakness (generalized): Secondary | ICD-10-CM | POA: Diagnosis not present

## 2020-02-01 DIAGNOSIS — M778 Other enthesopathies, not elsewhere classified: Secondary | ICD-10-CM | POA: Diagnosis not present

## 2020-02-01 DIAGNOSIS — M7551 Bursitis of right shoulder: Secondary | ICD-10-CM | POA: Diagnosis not present

## 2020-02-03 DIAGNOSIS — M6281 Muscle weakness (generalized): Secondary | ICD-10-CM | POA: Diagnosis not present

## 2020-02-03 DIAGNOSIS — M778 Other enthesopathies, not elsewhere classified: Secondary | ICD-10-CM | POA: Diagnosis not present

## 2020-02-03 DIAGNOSIS — M7541 Impingement syndrome of right shoulder: Secondary | ICD-10-CM | POA: Diagnosis not present

## 2020-02-03 DIAGNOSIS — M25611 Stiffness of right shoulder, not elsewhere classified: Secondary | ICD-10-CM | POA: Diagnosis not present

## 2020-02-03 DIAGNOSIS — M7551 Bursitis of right shoulder: Secondary | ICD-10-CM | POA: Diagnosis not present

## 2020-02-03 DIAGNOSIS — M25511 Pain in right shoulder: Secondary | ICD-10-CM | POA: Diagnosis not present

## 2020-02-03 NOTE — Assessment & Plan Note (Signed)
Fairly well controlled, continue current regimen and establish with Psychiatry ASAP

## 2020-02-03 NOTE — Assessment & Plan Note (Signed)
Not under good control due to stressors, but medications helping. Continue current regimen and follow up to establish with Psychiatry ASAP. Pt will call to reschedule missed appts

## 2020-02-04 ENCOUNTER — Other Ambulatory Visit: Payer: Self-pay | Admitting: Family Medicine

## 2020-02-04 MED ORDER — BUSPIRONE HCL 15 MG PO TABS: 15.0000 mg | ORAL_TABLET | Freq: Three times a day (TID) | ORAL | 1 refills | Status: DC

## 2020-02-04 MED ORDER — CLONAZEPAM 0.5 MG PO TABS: 0.5000 mg | ORAL_TABLET | Freq: Two times a day (BID) | ORAL | 2 refills | Status: DC | PRN

## 2020-02-08 DIAGNOSIS — M6281 Muscle weakness (generalized): Secondary | ICD-10-CM | POA: Diagnosis not present

## 2020-02-08 DIAGNOSIS — M778 Other enthesopathies, not elsewhere classified: Secondary | ICD-10-CM | POA: Diagnosis not present

## 2020-02-08 DIAGNOSIS — M25511 Pain in right shoulder: Secondary | ICD-10-CM | POA: Diagnosis not present

## 2020-02-08 DIAGNOSIS — M7551 Bursitis of right shoulder: Secondary | ICD-10-CM | POA: Diagnosis not present

## 2020-02-08 DIAGNOSIS — M7541 Impingement syndrome of right shoulder: Secondary | ICD-10-CM | POA: Diagnosis not present

## 2020-02-08 DIAGNOSIS — M25611 Stiffness of right shoulder, not elsewhere classified: Secondary | ICD-10-CM | POA: Diagnosis not present

## 2020-02-09 MED FILL — MONTELUKAST SOD 10 MG TAB: 10 | 30 days supply | Qty: 30 | Fill #3

## 2020-02-12 ENCOUNTER — Other Ambulatory Visit: Payer: Self-pay | Admitting: Family Medicine

## 2020-02-12 DIAGNOSIS — F3341 Major depressive disorder, recurrent, in partial remission: Secondary | ICD-10-CM

## 2020-02-12 DIAGNOSIS — F419 Anxiety disorder, unspecified: Secondary | ICD-10-CM

## 2020-02-16 DIAGNOSIS — M778 Other enthesopathies, not elsewhere classified: Secondary | ICD-10-CM | POA: Diagnosis not present

## 2020-02-16 DIAGNOSIS — M25511 Pain in right shoulder: Secondary | ICD-10-CM | POA: Diagnosis not present

## 2020-02-16 DIAGNOSIS — M7541 Impingement syndrome of right shoulder: Secondary | ICD-10-CM | POA: Diagnosis not present

## 2020-02-16 DIAGNOSIS — M7551 Bursitis of right shoulder: Secondary | ICD-10-CM | POA: Diagnosis not present

## 2020-02-22 MED FILL — oxyCODONE HCL 15 MG TABS: 15 | 30 days supply | Qty: 120 | Fill #0

## 2020-02-22 MED FILL — MORPHINE SULF ER 30 MG TAB: 30 | 30 days supply | Qty: 30 | Fill #0

## 2020-02-25 ENCOUNTER — Ambulatory Visit (INDEPENDENT_AMBULATORY_CARE_PROVIDER_SITE_OTHER): Payer: 59 | Admitting: Licensed Clinical Social Worker

## 2020-02-25 ENCOUNTER — Other Ambulatory Visit: Payer: Self-pay

## 2020-02-25 ENCOUNTER — Encounter: Payer: Self-pay | Admitting: Licensed Clinical Social Worker

## 2020-02-25 DIAGNOSIS — F339 Major depressive disorder, recurrent, unspecified: Secondary | ICD-10-CM
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
## 2020-03-02 ENCOUNTER — Encounter: Payer: Self-pay | Admitting: Family Medicine

## 2020-03-09 ENCOUNTER — Encounter: Payer: Self-pay | Admitting: Family Medicine

## 2020-03-09 ENCOUNTER — Other Ambulatory Visit: Payer: Self-pay | Admitting: Family Medicine

## 2020-03-09 MED ORDER — MONTELUKAST SODIUM 10 MG PO TABS: 10.0000 mg | ORAL_TABLET | Freq: Every day | ORAL | 3 refills | Status: DC

## 2020-03-09 MED FILL — MONTELUKAST SOD 10 MG TAB: 10 | 90 days supply | Qty: 90 | Fill #0

## 2020-03-10 ENCOUNTER — Encounter: Payer: Self-pay | Admitting: Licensed Clinical Social Worker

## 2020-03-10 ENCOUNTER — Other Ambulatory Visit: Payer: Self-pay

## 2020-03-10 ENCOUNTER — Ambulatory Visit (INDEPENDENT_AMBULATORY_CARE_PROVIDER_SITE_OTHER): Payer: 59 | Admitting: Licensed Clinical Social Worker

## 2020-03-10 DIAGNOSIS — F339 Major depressive disorder, recurrent, unspecified: Secondary | ICD-10-CM | POA: Diagnosis not present

## 2020-03-10 NOTE — Progress Notes (Signed)
Patient Location: Home  Provider Location: Home Office   Virtual Visit via Video Note  I connected with Jamie [name] on 03/10/20 at  4:00 PM EDT by a video enabled telemedicine application and verified that I am speaking with the correct person using two identifiers.   I discussed the limitations of evaluation and management by telemedicine and the availability of in person appointments. The patient expressed understanding and agreed to proceed.  THERAPY PROGRESS NOTE  Session Time: 30 Minutes  Participation Level: Active  Behavioral Response: CasualAlertDepressed  Type of Therapy: Individual Therapy  Treatment Goals addressed: Coping  Interventions: CBT  Summary: Jamie [name] is a 49 y.o. female who presents with depression sxs. Pt reported she had to leave work early due to sciatic nerve pain. Pt explained that she has been dealing with chronic pain since age 26. Pt also identified several stressors that have led to role strain and putting others' needs before her own. Pt acknowledged this and has identified that increasing time for herself by finding a position with better work life balance may be beneficial.  Suicidal/Homicidal: No  Therapist Response: Therapist met with patient for first session since CCA. Therapist and patient reviewed treatment plan and goals. Pt in agreement. Therapist and patient discussed concept of CBT for chronic pain management and current stressors as well as previous attempts to resolve and cope with stressors. Therapist encouraged patient to implement plan of prioritizing her own needs without experiencing guilt. Pt was receptive.   Plan: Return again in 2 weeks.  Diagnosis: Axis I: MDD, Recurrent    Axis II: N/A  [name], LCSW, LCAS 03/10/2020

## 2020-03-16 MED FILL — METAXALONE 800 MG TABLET: 800 | 30 days supply | Qty: 120 | Fill #1

## 2020-03-16 MED FILL — MELOXICAM 7.5 MG TABLET: 7.5 | 30 days supply | Qty: 60 | Fill #1

## 2020-03-16 MED FILL — GABAPENTIN 600 MG TABLET: 600 | 30 days supply | Qty: 90 | Fill #1

## 2020-03-21 MED FILL — MORPHINE SULF ER 30 MG TAB: 30 | 30 days supply | Qty: 30 | Fill #0

## 2020-03-21 MED FILL — oxyCODONE HCL 15 MG TABS: 15 | 30 days supply | Qty: 120 | Fill #0

## 2020-03-23 ENCOUNTER — Encounter: Payer: Self-pay | Admitting: Licensed Clinical Social Worker

## 2020-03-23 ENCOUNTER — Other Ambulatory Visit: Payer: Self-pay

## 2020-03-23 ENCOUNTER — Ambulatory Visit (INDEPENDENT_AMBULATORY_CARE_PROVIDER_SITE_OTHER): Payer: 59 | Admitting: Licensed Clinical Social Worker

## 2020-03-23 DIAGNOSIS — F339 Major depressive disorder, recurrent, unspecified: Secondary | ICD-10-CM | POA: Diagnosis not present

## 2020-03-23 NOTE — Progress Notes (Signed)
Patient Location: Home  Provider Location: Home Office   Virtual Visit via Video Note  I connected with Jamie [name] on 03/23/20 at 10:00 AM EDT by a video enabled telemedicine application and verified that I am speaking with the correct person using two identifiers.   I discussed the limitations of evaluation and management by telemedicine and the availability of in person appointments. The patient expressed understanding and agreed to proceed.  THERAPY PROGRESS NOTE  Session Time: 16 Minutes  Participation Level: Active  Behavioral Response: CasualAlertDepressed and Tearful  Type of Therapy: Individual Therapy  Treatment Goals addressed: Communication: Using Supports and Coping  Interventions: CBT  Summary: Jamie [name] is a 49 y.o. female who presents with depression sxs. Pt reported she is currently at their family beach house on vacation, but is still providing most of the care for her parents while family is visiting. Pt reported she continues to struggle with meeting all the demands in her life and managing her own chronic pain as well as going through a potential divorce. Pt acknowledged she cannot continue this way without some support and delegation of duties. Pt was receptive to therapist suggestions and reported that she will take time to read through the CBT for Chronic Pain materials she received for homework.   Suicidal/Homicidal: No  Therapist Response: Therapist met with patient for follow up session. Therapist and patient reviewed CBT for chronic pain management handouts for patient to look over by next session. Therapist and patient discussed barriers to asking for help and ways to communicate needs assertively and respectfully. Pt was receptive.   Plan: Return again in 2 weeks.  Diagnosis: Axis I: MDD, Recurrent    Axis II: N/A  [name], LCSW, LCAS 03/23/2020

## 2020-04-04 ENCOUNTER — Encounter: Payer: Self-pay | Admitting: Family Medicine

## 2020-04-04 ENCOUNTER — Other Ambulatory Visit: Payer: Self-pay

## 2020-04-04 ENCOUNTER — Ambulatory Visit (INDEPENDENT_AMBULATORY_CARE_PROVIDER_SITE_OTHER): Payer: 59 | Admitting: Family Medicine

## 2020-04-04 ENCOUNTER — Telehealth: Payer: Self-pay | Admitting: Family Medicine

## 2020-04-04 VITALS — BP 95/64 | HR 86 | Temp 98.5°F | Ht 66.5 in | Wt 157.0 lb

## 2020-04-04 DIAGNOSIS — Z1159 Encounter for screening for other viral diseases: Secondary | ICD-10-CM

## 2020-04-04 DIAGNOSIS — F419 Anxiety disorder, unspecified: Secondary | ICD-10-CM

## 2020-04-04 DIAGNOSIS — J069 Acute upper respiratory infection, unspecified: Secondary | ICD-10-CM | POA: Diagnosis not present

## 2020-04-04 DIAGNOSIS — F339 Major depressive disorder, recurrent, unspecified: Secondary | ICD-10-CM | POA: Diagnosis not present

## 2020-04-04 DIAGNOSIS — Z1211 Encounter for screening for malignant neoplasm of colon: Secondary | ICD-10-CM

## 2020-04-04 DIAGNOSIS — J3089 Other allergic rhinitis: Secondary | ICD-10-CM | POA: Diagnosis not present

## 2020-04-04 DIAGNOSIS — Z136 Encounter for screening for cardiovascular disorders: Secondary | ICD-10-CM

## 2020-04-04 DIAGNOSIS — Z0000 Encounter for general adult medical examination without abnormal findings: Secondary | ICD-10-CM | POA: Diagnosis not present

## 2020-04-04 DIAGNOSIS — G4700 Insomnia, unspecified: Secondary | ICD-10-CM | POA: Diagnosis not present

## 2020-04-04 DIAGNOSIS — L821 Other seborrheic keratosis: Secondary | ICD-10-CM

## 2020-04-04 MED ORDER — BUSPIRONE HCL 15 MG PO TABS: 15.0000 mg | ORAL_TABLET | Freq: Three times a day (TID) | ORAL | 1 refills | Status: DC

## 2020-04-04 MED ORDER — QUETIAPINE FUMARATE 50 MG PO TABS: 50.0000 mg | ORAL_TABLET | Freq: Every day | ORAL | 1 refills | Status: DC

## 2020-04-04 MED FILL — busPIRone HCL 15 MG TABS: 15 | 30 days supply | Qty: 90 | Fill #0

## 2020-04-04 MED FILL — QUETIAPINE FUMARATE 50 MG T: 50 | 90 days supply | Qty: 90 | Fill #0

## 2020-04-04 NOTE — Assessment & Plan Note (Signed)
Continue allergy regimen

## 2020-04-04 NOTE — Telephone Encounter (Signed)
As discussed at visit, my recommendation is to use OTC and allergy medications and watch how symptoms do over the week and also get COVID test since it seems viral. No antibiotics needed at the moment

## 2020-04-04 NOTE — Assessment & Plan Note (Signed)
Stable, continue current regimen 

## 2020-04-04 NOTE — Assessment & Plan Note (Signed)
Stable, continue present medications and await est visit next month with Psychiatry

## 2020-04-04 NOTE — Telephone Encounter (Signed)
Called and notified patient of Rachel's message. Patient verbalized understanding.

## 2020-04-04 NOTE — Telephone Encounter (Signed)
Copied from [place] [phone] [id number]. Topic: General - Other >> Apr 04, 2020 10:38 AM Hinda Lenis D wrote: PT seeing by Apolonio Schneiders this morning, need some antibiotics for her respiratory infection / please advise

## 2020-04-04 NOTE — Assessment & Plan Note (Signed)
Fairly stable on medication regimen and counseling, continue present medications and await est care visit with Psychiatry

## 2020-04-04 NOTE — Progress Notes (Signed)
BP 95/64   Pulse 86   Temp 98.5 F (36.9 C) (Oral)   Ht 5' 6.5" (1.689 m)   Wt 157 lb (71.2 kg)   LMP 06/20/2016 (Approximate)   SpO2 97%   BMI 24.96 kg/m    Subjective:    Patient ID: Jamie [name], female    DOB: 07-14-1971, 49 y.o.   MRN: [id number]  HPI: Jamie [name] is a 49 y.o. female presenting on 04/04/2020 for comprehensive medical examination. Current medical complaints include:see below  Started 3 days ago with worsening allergy/sinus sxs - congestion, sinus pain and pressure, chest pressure, productive cough. Denies fever, chills, CP, SOB, myalgias, body aches. Trying drinking lots of water, taking zinc supplements, vit C, and singulair and flonase regimen.   Anxiety and depression - working now with a counselor which seems to be going well. Has new pt appt with Psychiatry coming up in about 3 weeks. Still taking buspar, seroquel, and klonopin BID prn.   She currently lives with: Menopausal Symptoms: no  Depression Screen done today and results listed below:  Depression screen [facility] 2/9 04/04/2020 01/26/2020 11/04/2019 09/14/2019 07/24/2019  Decreased Interest 3 2 2 1 2   Down, Depressed, Hopeless 3 3 3 1 3   PHQ - 2 Score 6 5 5 2 5   Altered sleeping 3 3 3 3 3   Tired, decreased energy 2 2 2 3 2   Change in appetite 3 3 3 3 3   Feeling bad or failure about yourself  2 2 2 3 2   Trouble concentrating 2 3 3 2 3   Moving slowly or fidgety/restless 3 2 3 3 1   Suicidal thoughts 0 0 0 0 0  PHQ-9 Score 21 20 21 19 19   Difficult doing work/chores - Somewhat difficult Somewhat difficult - Somewhat difficult  Some recent data might be hidden    The patient does not have a history of falls. I did complete a risk assessment for falls. A plan of care for falls was documented.   Past Medical History:  Past Medical History:  Diagnosis Date  . Anxiety    takes Ativan daily.  Panic Attack  . Arthritis   . Chronic back pain    herniated disc/stenosis/scoliosis  . Depression     takes Effexor daily  . Eczema    uses a cream daily as needed  . Family history of adverse reaction to anesthesia    pta dad is very hard to wake up;excessive nausea  . History of bronchitis    > 84yr ago  . Hypoglycemia   . Insomnia    takes Lorazepam nightly  . Joint pain   . PONV (postoperative nausea and vomiting)   . Seasonal allergies    uses Flonase daily  . Vitamin D deficiency    takes Vit D daily    Surgical History:  Past Surgical History:  Procedure Laterality Date  . BACK SURGERY  2008/2012   laminectomy 1st time and 2nd time laminectomy and fusion  . MAXIMUM ACCESS (MAS)POSTERIOR LUMBAR INTERBODY FUSION (PLIF) 2 LEVEL N/A 09/01/2014   Procedure: Lumbar four-five, Lumbar five-Sacral one Maximum Access Surgery posterior lumbar interbody fusion with interbody prosthesis posterior lateral arthrodesis posterior segmental instrumentation;  Surgeon: Elaina Hoops, MD;  Location: [facility];  Service: Neurosurgery;  Laterality: N/A;  Lumbar four-five, Lumbar five-Sacral one Maximum Access Surgery posterior lumbar interbody fusion with i    Medications:  Current Outpatient Medications on File Prior to Visit  Medication Sig  . cholecalciferol (VITAMIN  D) 1000 UNITS tablet Take 1,000 Units by mouth daily.  . clonazePAM (KLONOPIN) 0.5 MG tablet Take 1 tablet (0.5 mg total) by mouth 2 (two) times daily as needed for anxiety.  Stasia Cavalier (EUCRISA) 2 % OINT Apply 1 application topically 2 (two) times daily as needed.  . fluticasone (FLONASE) 50 MCG/ACT nasal spray Place 1 spray into both nostrils daily as needed for allergies.   Marland Kitchen gabapentin (NEURONTIN) 600 MG tablet Take 600 mg by mouth 3 (three) times daily.   . meloxicam (MOBIC) 15 MG tablet Take 15 mg by mouth daily.  . metaxalone (SKELAXIN) 800 MG tablet Take 800 mg by mouth 3 (three) times daily.  . montelukast (SINGULAIR) 10 MG tablet Take 1 tablet (10 mg total) by mouth at bedtime.  Marland Kitchen morphine (KADIAN) 30 MG 24 hr  capsule Take 30 mg by mouth daily.  . Multiple Vitamin (MULTIVITAMIN ADULT PO) Take by mouth daily.  Marland Kitchen oxyCODONE (ROXICODONE) 15 MG immediate release tablet Take 15 mg by mouth every 6 (six) hours as needed.   . triamcinolone cream (KENALOG) 0.1 % Apply 1 application topically 2 (two) times daily.   No current facility-administered medications on file prior to visit.    Allergies:  Allergies  Allergen Reactions  . Shellfish Allergy Hives  . Flexeril [Cyclobenzaprine] Hives    RAPID HEARTBEAT    Social History:  Social History   Socioeconomic History  . Marital status: Legally Separated    Spouse name: Not on file  . Number of children: Not on file  . Years of education: Not on file  . Highest education level: Not on file  Occupational History  . Not on file  Tobacco Use  . Smoking status: Former Smoker    Packs/day: 0.50    Years: 20.00    Pack years: 10.00    Quit date: 10/18/2017    Years since quitting: 2.4  . Smokeless tobacco: Never Used  Vaping Use  . Vaping Use: Never used  Substance and Sexual Activity  . Alcohol use: No  . Drug use: No  . Sexual activity: Not Currently  Other Topics Concern  . Not on file  Social History Narrative  . Not on file   Social Determinants of Health   Financial Resource Strain:   . Difficulty of Paying Living Expenses:   Food Insecurity:   . Worried About Charity fundraiser in the Last Year:   . Arboriculturist in the Last Year:   Transportation Needs:   . Film/video editor (Medical):   Marland Kitchen Lack of Transportation (Non-Medical):   Physical Activity:   . Days of Exercise per Week:   . Minutes of Exercise per Session:   Stress:   . Feeling of Stress :   Social Connections:   . Frequency of Communication with Friends and Family:   . Frequency of Social Gatherings with Friends and Family:   . Attends Religious Services:   . Active Member of Clubs or Organizations:   . Attends Archivist Meetings:   Marland Kitchen  Marital Status:   Intimate Partner Violence:   . Fear of Current or Ex-Partner:   . Emotionally Abused:   Marland Kitchen Physically Abused:   . Sexually Abused:    Social History   Tobacco Use  Smoking Status Former Smoker  . Packs/day: 0.50  . Years: 20.00  . Pack years: 10.00  . Quit date: 10/18/2017  . Years since quitting: 2.4  Smokeless Tobacco Never  Used   Social History   Substance and Sexual Activity  Alcohol Use No    Family History:  Family History  Problem Relation Age of Onset  . Arthritis Mother   . Hyperlipidemia Mother   . Migraines Mother   . Arthritis Father   . Diabetes Father   . Heart disease Father   . Diabetes Brother   . Breast cancer Maternal Grandmother     Past medical history, surgical history, medications, allergies, family history and social history reviewed with patient today and changes made to appropriate areas of the chart.   Review of Systems - General ROS: negative Psychological ROS: negative Ophthalmic ROS: negative ENT ROS: negative Allergy and Immunology ROS: negative Hematological and Lymphatic ROS: negative Endocrine ROS: negative Breast ROS: negative for breast lumps Respiratory ROS: no cough, shortness of breath, or wheezing Cardiovascular ROS: no chest pain or dyspnea on exertion Gastrointestinal ROS: no abdominal pain, change in bowel habits, or black or bloody stools Genito-Urinary ROS: no dysuria, trouble voiding, or hematuria Musculoskeletal ROS: negative Neurological ROS: no TIA or stroke symptoms Dermatological ROS: negative All other ROS negative except what is listed above and in the HPI.      Objective:    BP 95/64   Pulse 86   Temp 98.5 F (36.9 C) (Oral)   Ht 5' 6.5" (1.689 m)   Wt 157 lb (71.2 kg)   LMP 06/20/2016 (Approximate)   SpO2 97%   BMI 24.96 kg/m   Wt Readings from Last 3 Encounters:  04/04/20 157 lb (71.2 kg)  01/26/20 158 lb (71.7 kg)  11/04/19 175 lb (79.4 kg)    Physical Exam Vitals and  nursing note reviewed.  Constitutional:      General: She is not in acute distress.    Appearance: She is well-developed.  HENT:     Head: Atraumatic.     Right Ear: External ear normal.     Left Ear: External ear normal.     Nose: Nose normal.     Mouth/Throat:     Pharynx: No oropharyngeal exudate.  Eyes:     General: No scleral icterus.    Conjunctiva/sclera: Conjunctivae normal.     Pupils: Pupils are equal, round, and reactive to light.  Neck:     Thyroid: No thyromegaly.  Cardiovascular:     Rate and Rhythm: Normal rate and regular rhythm.     Heart sounds: Normal heart sounds.  Pulmonary:     Effort: Pulmonary effort is normal. No respiratory distress.     Breath sounds: Normal breath sounds.  Chest:     Breasts:        Right: No mass, skin change or tenderness.        Left: No mass, skin change or tenderness.  Abdominal:     General: Bowel sounds are normal.     Palpations: Abdomen is soft. There is no mass.     Tenderness: There is no abdominal tenderness.  Genitourinary:    Comments: GU exam deferred with shared decision making Musculoskeletal:        General: No tenderness. Normal range of motion.     Cervical back: Normal range of motion and neck supple.  Lymphadenopathy:     Cervical: No cervical adenopathy.     Upper Body:     Right upper body: No axillary adenopathy.     Left upper body: No axillary adenopathy.  Skin:    General: Skin is warm and dry.  Findings: No rash.  Neurological:     Mental Status: She is alert and oriented to person, place, and time.     Cranial Nerves: No cranial nerve deficit.  Psychiatric:        Behavior: Behavior normal.     Results for orders placed or performed in visit on 04/04/20  Microscopic Examination   Urine  Result Value Ref Range   WBC, UA 0-5 0 - 5 /hpf   RBC None seen 0 - 2 /hpf   Epithelial Cells (non renal) 0-10 0 - 10 /hpf   Bacteria, UA None seen None seen/Few  Urine Culture, Reflex   Urine    Result Value Ref Range   Urine Culture, Routine WILL FOLLOW   UA/M w/rflx Culture, Routine   Specimen: Urine   Urine  Result Value Ref Range   Specific Gravity, UA <1.005 (L) 1.005 - 1.030   pH, UA 5.5 5.0 - 7.5   Color, UA Yellow Yellow   Appearance Ur Clear Clear   Leukocytes,UA Trace (A) Negative   Protein,UA Negative Negative/Trace   Glucose, UA Negative Negative   Ketones, UA Negative Negative   RBC, UA Negative Negative   Bilirubin, UA Negative Negative   Urobilinogen, Ur 0.2 0.2 - 1.0 mg/dL   Nitrite, UA Negative Negative   Microscopic Examination See below:    Urinalysis Reflex Comment       Assessment & Plan:   Problem List Items Addressed This Visit      Respiratory   Allergic rhinitis    Continue allergy regimen        Other   Depression, recurrent ([place]) - Primary    Fairly stable on medication regimen and counseling, continue present medications and await est care visit with Psychiatry      Relevant Medications   busPIRone (BUSPAR) 15 MG tablet   Anxiety    Stable, continue present medications and await est visit next month with Psychiatry      Relevant Medications   busPIRone (BUSPAR) 15 MG tablet   Insomnia    Stable, continue current regimen       Other Visit Diagnoses    Annual physical exam       Relevant Orders   CBC with Differential/Platelet   Comprehensive metabolic panel   TSH   UA/M w/rflx Culture, Routine (Completed)   Viral URI with cough       Unclear if allergy exacerbation or viral etiology. Will give work note until [place] testing can be performed. Supportive/OTC recommendations given   Seborrheic keratoses       Relevant Orders   Ambulatory referral to Dermatology   Colon cancer screening       Relevant Orders   Ambulatory referral to Gastroenterology   Need for hepatitis C screening test       Relevant Orders   Hepatitis C antibody   Screening for cardiovascular condition       Relevant Orders   Lipid Panel w/o  Chol/HDL Ratio       Follow up plan: Return in about 1 year (around 04/04/2021) for CPE.   LABORATORY TESTING:  - Pap smear: up to date  IMMUNIZATIONS:   - Tdap: Tetanus vaccination status reviewed: last tetanus booster within 10 years. - Influenza: Up to date  SCREENING: -Mammogram: Up to date  - Colonoscopy: Ordered today   PATIENT COUNSELING:   Advised to take 1 mg of folate supplement per day if capable of pregnancy.   Sexuality: Discussed  sexually transmitted diseases, partner selection, use of condoms, avoidance of unintended pregnancy  and contraceptive alternatives.   Advised to avoid cigarette smoking.  I discussed with the patient that most people either abstain from alcohol or drink within safe limits (<=14/week and <=4 drinks/occasion for males, <=7/weeks and <= 3 drinks/occasion for females) and that the risk for alcohol disorders and other health effects rises proportionally with the number of drinks per week and how often a drinker exceeds daily limits.  Discussed cessation/primary prevention of drug use and availability of treatment for abuse.   Diet: Encouraged to adjust caloric intake to maintain  or achieve ideal body weight, to reduce intake of dietary saturated fat and total fat, to limit sodium intake by avoiding high sodium foods and not adding table salt, and to maintain adequate dietary potassium and calcium preferably from fresh fruits, vegetables, and low-fat dairy products.    stressed the importance of regular exercise  Injury prevention: Discussed safety belts, safety helmets, smoke detector, smoking near bedding or upholstery.   Dental health: Discussed importance of regular tooth brushing, flossing, and dental visits.    NEXT PREVENTATIVE PHYSICAL DUE IN 1 YEAR. Return in about 1 year (around 04/04/2021) for CPE.

## 2020-04-05 LAB — CBC WITH DIFFERENTIAL/PLATELET
Basophils Absolute: 0 10*3/uL (ref 0.0–0.2)
Basos: 0 %
EOS (ABSOLUTE): 0 10*3/uL (ref 0.0–0.4)
Eos: 0 %
Hematocrit: 41.9 % (ref 34.0–46.6)
Hemoglobin: 13.5 g/dL (ref 11.1–15.9)
Immature Grans (Abs): 0 10*3/uL (ref 0.0–0.1)
Immature Granulocytes: 0 %
Lymphocytes Absolute: 1.8 10*3/uL (ref 0.7–3.1)
Lymphs: 35 %
MCH: 31.6 pg (ref 26.6–33.0)
MCHC: 32.2 g/dL (ref 31.5–35.7)
MCV: 98 fL — ABNORMAL HIGH (ref 79–97)
Monocytes Absolute: 0.6 10*3/uL (ref 0.1–0.9)
Monocytes: 11 %
Neutrophils Absolute: 2.7 10*3/uL (ref 1.4–7.0)
Neutrophils: 54 %
Platelets: 211 10*3/uL (ref 150–450)
RBC: 4.27 x10E6/uL (ref 3.77–5.28)
RDW: 12.4 % (ref 11.7–15.4)
WBC: 5.1 10*3/uL (ref 3.4–10.8)

## 2020-04-05 LAB — LIPID PANEL W/O CHOL/HDL RATIO
Cholesterol, Total: 193 mg/dL (ref 100–199)
HDL: 59 mg/dL (ref >39)
LDL Chol Calc (NIH): 124 mg/dL — ABNORMAL HIGH (ref 0–99)
Triglycerides: 54 mg/dL (ref 0–149)
VLDL Cholesterol Cal: 10 mg/dL (ref 5–40)

## 2020-04-05 LAB — COMPREHENSIVE METABOLIC PANEL
ALT: 25 IU/L (ref 0–32)
AST: 24 IU/L (ref 0–40)
Albumin/Globulin Ratio: 1.7 (ref 1.2–2.2)
Albumin: 4.6 g/dL (ref 3.8–4.8)
Alkaline Phosphatase: 85 IU/L (ref 48–121)
BUN/Creatinine Ratio: 17 (ref 9–23)
BUN: 12 mg/dL (ref 6–24)
Bilirubin Total: <0.2 mg/dL (ref 0.0–1.2)
CO2: 25 mmol/L (ref 20–29)
Calcium: 9.7 mg/dL (ref 8.7–10.2)
Chloride: 102 mmol/L (ref 96–106)
Creatinine, Ser: 0.7 mg/dL (ref 0.57–1.00)
GFR calc Af Amer: 117 mL/min/{1.73_m2} (ref >59)
GFR calc non Af Amer: 101 mL/min/{1.73_m2} (ref >59)
Globulin, Total: 2.7 g/dL (ref 1.5–4.5)
Glucose: 96 mg/dL (ref 65–99)
Potassium: 5.1 mmol/L (ref 3.5–5.2)
Sodium: 140 mmol/L (ref 134–144)
Total Protein: 7.3 g/dL (ref 6.0–8.5)

## 2020-04-05 LAB — TSH: TSH: 1.56 u[IU]/mL (ref 0.450–4.500)

## 2020-04-05 LAB — HEPATITIS C ANTIBODY: Hep C Virus Ab: <0.1 s/co ratio (ref 0.0–0.9)

## 2020-04-06 ENCOUNTER — Other Ambulatory Visit: Payer: Self-pay

## 2020-04-06 ENCOUNTER — Ambulatory Visit (INDEPENDENT_AMBULATORY_CARE_PROVIDER_SITE_OTHER): Payer: 59 | Admitting: Licensed Clinical Social Worker

## 2020-04-06 ENCOUNTER — Telehealth: Payer: Self-pay | Admitting: Family Medicine

## 2020-04-06 ENCOUNTER — Encounter: Payer: Self-pay | Admitting: Licensed Clinical Social Worker

## 2020-04-06 DIAGNOSIS — F339 Major depressive disorder, recurrent, unspecified: Secondary | ICD-10-CM | POA: Diagnosis not present

## 2020-04-06 LAB — URINE CULTURE, REFLEX

## 2020-04-06 LAB — UA/M W/RFLX CULTURE, ROUTINE
Bilirubin, UA: NEGATIVE
Glucose, UA: NEGATIVE
Ketones, UA: NEGATIVE
Nitrite, UA: NEGATIVE
Protein,UA: NEGATIVE
RBC, UA: NEGATIVE
Specific Gravity, UA: <1.005 — ABNORMAL LOW (ref 1.005–1.030)
Urobilinogen, Ur: 0.2 mg/dL (ref 0.2–1.0)
pH, UA: 5.5 (ref 5.0–7.5)

## 2020-04-06 LAB — MICROSCOPIC EXAMINATION
Bacteria, UA: NONE SEEN
RBC, Urine: NONE SEEN /hpf (ref 0–2)

## 2020-04-06 NOTE — Progress Notes (Signed)
Patient Location: Home  Provider Location: Home Office   Virtual Visit via Video Note  I connected with [name] on 04/06/20 at  8:00 AM EDT by a video enabled telemedicine application and verified that I am speaking with the correct person using two identifiers.   I discussed the limitations of evaluation and management by telemedicine and the availability of in person appointments. The patient expressed understanding and agreed to proceed.  THERAPY PROGRESS NOTE  Session Time: 43 Minutes  Participation Level: Active  Behavioral Response: CasualAlertDepressed  Type of Therapy: Individual Therapy  Treatment Goals addressed: Coping  Interventions: CBT  Summary: Jamie [name] is a 49 y.o. female who presents with depression sxs. Pt reported she is home sick from work and did not get a chance to complete homework assignment. Pt reported that she will take time this week to review material and discuss next session. Pt reported having a family conflict while on vacation and processed her thoughts and feelings. Pt identified marital issues and difficulty moving forward with divorce from spouse who she has been separated and living apart from over the last 6 years.  Suicidal/Homicidal: No  Therapist Response: Therapist met with patient for follow up session. Therapist and patient reviewed homework assignment. Therapist normalized patient's emotional response to the dissolution of her marriage and allowed her space to think through the situation. Therapist encouraged patient to recognize thoughts that are unhelpful and make steps towards communicating her needs to spouse. Pt was very receptive.  Plan: Return again in 2-3 weeks.  Diagnosis: Axis I: MDD, Recurrent    Axis II: N/A  [name], LCSW, LCAS 04/06/2020

## 2020-04-06 NOTE — Telephone Encounter (Signed)
Copied from [place] [id number]. Topic: Quick Communication - See Telephone Encounter >> Apr 06, 2020  2:17 PM Loma Boston wrote: CRM for notification. See Telephone encounter for: 04/06/20. Patient called office to make sure office was aware she tested postive for covid. Pt is very disraught and wanting to know how she is to deal with this regarding, herself.. parents.. pt was in tears and would like a call back [phone] >> Apr 06, 2020  2:41 PM Levert Feinstein wrote: Pt spoke with practice administrator. Per practice admin route to PCP Pt positive for covid.Pt stated she still feels as if she has a sinus infection and would like antibiotics called in for this , Pt also has questions in regards to her parents who she lives with due to being covid positive is unsure of what she should do for their care.

## 2020-04-06 NOTE — Telephone Encounter (Signed)
She should continue rest and supportive care and see if her immune system starts clearing this. Antibiotics are not indicated for a viral infection and this is still early in the viral stage. She should quarantine based on her company policy which is typically 2 weeks from onset

## 2020-04-06 NOTE — Telephone Encounter (Signed)
Called and notified patient of Rachel's message. Patient states she really feels like she has a sinus infection and is very worried. She states she is taking her parents to be tested, in separate cars, and is extremely worried that they may have COVID as well. Patient thanked me for the call and is going to let us know how she is doing in a few days.

## 2020-04-07 ENCOUNTER — Telehealth: Payer: Self-pay | Admitting: Family Medicine

## 2020-04-07 ENCOUNTER — Encounter: Payer: Self-pay | Admitting: Family Medicine

## 2020-04-07 NOTE — Telephone Encounter (Signed)
Routing to a provider in office.  Copied from [place] [id number]. Topic: General - Other >> Apr 07, 2020 11:35 AM Antonieta Iba C wrote: Reason for CRM: pt called in to follow up with PCP. Pt says that she was seen and has been Dx with Covid. Pt would like to know if provider would prescribe her a  Rx for cough, congestion, pt says that she feels that a inhaler will also help.     Pharmacy: [facility], [place]  Phone:  [phone] Fax:  [phone]    CB: [phone] -

## 2020-04-07 NOTE — Telephone Encounter (Signed)
Called pt to set up virtual, pt unable to speak with Malachy Mood today due to having to take her dad to [facility] for an infusion at 4. Scheduled virtual for tomorrow at 3:20 with Dr. Wynetta Emery pt is upset saying that she was just here for a visit not understanding why she needs another visit and can't have something called in for her when she was told to call in if she needed anything. Advised per provider a virtual visit is needed.

## 2020-04-07 NOTE — Telephone Encounter (Signed)
Needs virtual

## 2020-04-08 ENCOUNTER — Telehealth (INDEPENDENT_AMBULATORY_CARE_PROVIDER_SITE_OTHER): Payer: 59 | Admitting: Family Medicine

## 2020-04-08 ENCOUNTER — Encounter: Payer: Self-pay | Admitting: Family Medicine

## 2020-04-08 DIAGNOSIS — U071 COVID-19: Secondary | ICD-10-CM | POA: Diagnosis not present

## 2020-04-08 MED ORDER — AZITHROMYCIN 250 MG PO TABS: ORAL_TABLET | ORAL | 0 refills | Status: DC

## 2020-04-08 MED ORDER — ALBUTEROL SULFATE HFA 108 (90 BASE) MCG/ACT IN AERS: 2.0000 | INHALATION_SPRAY | Freq: Four times a day (QID) | RESPIRATORY_TRACT | 0 refills | Status: DC | PRN

## 2020-04-08 MED ORDER — PREDNISONE 10 MG PO TABS: ORAL_TABLET | ORAL | 0 refills | Status: DC

## 2020-04-08 MED ORDER — HYDROCOD POLST-CPM POLST ER 10-8 MG/5ML PO SUER: 5.0000 mL | Freq: Two times a day (BID) | ORAL | 0 refills | Status: AC | PRN

## 2020-04-08 MED ORDER — BENZONATATE 200 MG PO CAPS: 200.0000 mg | ORAL_CAPSULE | Freq: Two times a day (BID) | ORAL | 0 refills | Status: DC | PRN

## 2020-04-08 NOTE — Progress Notes (Signed)
LMP 06/20/2016 (Approximate)    Subjective:    Patient ID: Jamie [name], female    DOB: Oct 23, 1970, 49 y.o.   MRN: [id number]  HPI: Jamie [name] is a 49 y.o. female  Chief Complaint  Patient presents with  . URI    pt states she was diagnosed with COVID 04/05/20. States she has a cough, congestion, sore throat, fatigue and sinus pressure. Thinks she may need an inhaler   UPPER RESPIRATORY TRACT INFECTION Duration: about 6 days Worst symptom: congestion, cough Fever: no Cough: yes Shortness of breath: no Wheezing: no Chest pain: no Chest tightness: no Chest congestion: no Nasal congestion: yes Runny nose: yes Post nasal drip: yes Sneezing: yes Sore throat: yes Swollen glands: no Sinus pressure: yes Headache: yes Face pain: yes Toothache: no Ear pain: no  Ear pressure: yes bilateral Eyes red/itching:no Eye drainage/crusting: no  Vomiting: no Rash: no Fatigue: yes Sick contacts: yes Strep contacts: no  Context: worse Recurrent sinusitis: no Relief with OTC cold/cough medications: no  Treatments attempted: cold/sinus, mucinex and anti-histamine   Relevant past medical, surgical, family and social history reviewed and updated as indicated. Interim medical history since our last visit reviewed. Allergies and medications reviewed and updated.  Review of Systems  Constitutional: Positive for chills, diaphoresis and fatigue. Negative for activity change, appetite change, fever and unexpected weight change.  HENT: Positive for congestion, postnasal drip, rhinorrhea, sinus pressure, sinus pain and sneezing. Negative for dental problem, drooling, ear discharge, ear pain, facial swelling, hearing loss, mouth sores, nosebleeds, sore throat, tinnitus, trouble swallowing and voice change.   Eyes: Negative.   Respiratory: Positive for cough. Negative for apnea, choking, chest tightness, shortness of breath, wheezing and stridor.   Cardiovascular: Negative.     Gastrointestinal: Negative.   Musculoskeletal: Positive for myalgias.  Psychiatric/Behavioral: Negative.     Per HPI unless specifically indicated above     Objective:    LMP 06/20/2016 (Approximate)   Wt Readings from Last 3 Encounters:  04/04/20 157 lb (71.2 kg)  01/26/20 158 lb (71.7 kg)  11/04/19 175 lb (79.4 kg)    Physical Exam Vitals and nursing note reviewed.  Constitutional:      General: She is not in acute distress.    Appearance: Normal appearance. She is not ill-appearing, toxic-appearing or diaphoretic.  HENT:     Head: Normocephalic and atraumatic.     Right Ear: External ear normal.     Left Ear: External ear normal.     Nose: Nose normal.     Mouth/Throat:     Mouth: Mucous membranes are moist.     Pharynx: Oropharynx is clear.  Eyes:     General: No scleral icterus.       Right eye: No discharge.        Left eye: No discharge.     Conjunctiva/sclera: Conjunctivae normal.     Pupils: Pupils are equal, round, and reactive to light.  Pulmonary:     Effort: Pulmonary effort is normal. No respiratory distress.     Comments: Speaking in full sentences, deep hacking cough Musculoskeletal:        General: Normal range of motion.     Cervical back: Normal range of motion.  Skin:    Coloration: Skin is not jaundiced or pale.     Findings: No bruising, erythema, lesion or rash.  Neurological:     Mental Status: She is alert and oriented to person, place, and time. Mental status is at baseline.  Psychiatric:        Mood and Affect: Mood normal.        Behavior: Behavior normal.        Thought Content: Thought content normal.        Judgment: Judgment normal.     Results for orders placed or performed in visit on 04/04/20  Microscopic Examination   Urine  Result Value Ref Range   WBC, UA 0-5 0 - 5 /hpf   RBC None seen 0 - 2 /hpf   Epithelial Cells (non renal) 0-10 0 - 10 /hpf   Bacteria, UA None seen None seen/Few  Urine Culture, Reflex   Urine   Result Value Ref Range   Urine Culture, Routine Final report    Organism ID, Bacteria Comment   Hepatitis C antibody  Result Value Ref Range   Hep C Virus Ab <0.1 0.0 - 0.9 s/co ratio  CBC with Differential/Platelet  Result Value Ref Range   WBC 5.1 3.4 - 10.8 x10E3/uL   RBC 4.27 3.77 - 5.28 x10E6/uL   Hemoglobin 13.5 11.1 - 15.9 g/dL   Hematocrit 41.9 34.0 - 46.6 %   MCV 98 (H) 79 - 97 fL   MCH 31.6 26.6 - 33.0 pg   MCHC 32.2 31 - 35 g/dL   RDW 12.4 11.7 - 15.4 %   Platelets 211 150 - 450 x10E3/uL   Neutrophils 54 Not Estab. %   Lymphs 35 Not Estab. %   Monocytes 11 Not Estab. %   Eos 0 Not Estab. %   Basos 0 Not Estab. %   Neutrophils Absolute 2.7 1 - 7 x10E3/uL   Lymphocytes Absolute 1.8 0 - 3 x10E3/uL   Monocytes Absolute 0.6 0 - 0 x10E3/uL   EOS (ABSOLUTE) 0.0 0.0 - 0.4 x10E3/uL   Basophils Absolute 0.0 0 - 0 x10E3/uL   Immature Granulocytes 0 Not Estab. %   Immature Grans (Abs) 0.0 0.0 - 0.1 x10E3/uL  Comprehensive metabolic panel  Result Value Ref Range   Glucose 96 65 - 99 mg/dL   BUN 12 6 - 24 mg/dL   Creatinine, Ser 0.70 0.57 - 1.00 mg/dL   GFR calc non Af Amer 101 >59 mL/min/1.73   GFR calc Af Amer 117 >59 mL/min/1.73   BUN/Creatinine Ratio 17 9 - 23   Sodium 140 134 - 144 mmol/L   Potassium 5.1 3.5 - 5.2 mmol/L   Chloride 102 96 - 106 mmol/L   CO2 25 20 - 29 mmol/L   Calcium 9.7 8.7 - 10.2 mg/dL   Total Protein 7.3 6.0 - 8.5 g/dL   Albumin 4.6 3.8 - 4.8 g/dL   Globulin, Total 2.7 1.5 - 4.5 g/dL   Albumin/Globulin Ratio 1.7 1.2 - 2.2   Bilirubin Total <0.2 0.0 - 1.2 mg/dL   Alkaline Phosphatase 85 48 - 121 IU/L   AST 24 0 - 40 IU/L   ALT 25 0 - 32 IU/L  TSH  Result Value Ref Range   TSH 1.560 0.450 - 4.500 uIU/mL  UA/M w/rflx Culture, Routine   Specimen: Urine   Urine  Result Value Ref Range   Specific Gravity, UA <1.005 (L) 1.005 - 1.030   pH, UA 5.5 5.0 - 7.5   Color, UA Yellow Yellow   Appearance Ur Clear Clear   Leukocytes,UA Trace (A)  Negative   Protein,UA Negative Negative/Trace   Glucose, UA Negative Negative   Ketones, UA Negative Negative   RBC, UA Negative Negative   Bilirubin,  UA Negative Negative   Urobilinogen, Ur 0.2 0.2 - 1.0 mg/dL   Nitrite, UA Negative Negative   Microscopic Examination See below:    Urinalysis Reflex Comment   Lipid Panel w/o Chol/HDL Ratio  Result Value Ref Range   Cholesterol, Total 193 100 - 199 mg/dL   Triglycerides 54 0 - 149 mg/dL   HDL 59 >39 mg/dL   VLDL Cholesterol Cal 10 5 - 40 mg/dL   LDL Chol Calc (NIH) 124 (H) 0 - 99 mg/dL      Assessment & Plan:   Problem List Items Addressed This Visit    None    Visit Diagnoses    COVID-19    -  Primary   Will treat with prednisone, azithromycin, tussionex, albuterol and tessalon perles for symptom relief. Call if not getting better or getting wose.    Relevant Medications   azithromycin (ZITHROMAX) 250 MG tablet       Follow up plan: Return if symptoms worsen or fail to improve.    . This visit was completed via MyChart due to the restrictions of the COVID-19 pandemic. All issues as above were discussed and addressed. Physical exam was done as above through visual confirmation on MyChart. If it was felt that the patient should be evaluated in the office, they were directed there. The patient verbally consented to this visit. . Location of the patient: home . Location of the provider: work . Those involved with this call:  . Provider: Park Liter, DO . CMA: Yvonna Alanis, [place] . Front Desk/Registration: Don Perking  . Time spent on call: 15 minutes with patient face to face via video conference. More than 50% of this time was spent in counseling and coordination of care. 23 minutes total spent in review of patient's record and preparation of their chart.

## 2020-04-09 ENCOUNTER — Other Ambulatory Visit: Payer: Self-pay | Admitting: Physician Assistant

## 2020-04-09 DIAGNOSIS — U071 COVID-19: Secondary | ICD-10-CM

## 2020-04-09 DIAGNOSIS — Z6825 Body mass index (BMI) 25.0-25.9, adult: Secondary | ICD-10-CM

## 2020-04-09 NOTE — Progress Notes (Signed)
I connected by phone with Jamie [name] on 04/09/2020 at 11:44 AM to discuss the potential use of a new treatment for mild to moderate COVID-19 viral infection in non-hospitalized patients.  This patient is a 49 y.o. female that meets the FDA criteria for Emergency Use Authorization of COVID monoclonal antibody casirivimab/imdevimab.  Has a (+) direct SARS-CoV-2 viral test result  Has mild or moderate COVID-19   Is NOT hospitalized due to COVID-19  Is within 10 days of symptom onset  Has at least one of the high risk factor(s) for progression to severe COVID-19 and/or hospitalization as defined in EUA.  Specific high risk criteria : BMI > 25   I have spoken and communicated the following to the patient or parent/caregiver regarding COVID monoclonal antibody treatment:  1. FDA has authorized the emergency use for the treatment of mild to moderate COVID-19 in adults and pediatric patients with positive results of direct SARS-CoV-2 viral testing who are 26 years of age and older weighing at least 40 kg, and who are at high risk for progressing to severe COVID-19 and/or hospitalization.  2. The significant known and potential risks and benefits of COVID monoclonal antibody, and the extent to which such potential risks and benefits are unknown.  3. Information on available alternative treatments and the risks and benefits of those alternatives, including clinical trials.  4. Patients treated with COVID monoclonal antibody should continue to self-isolate and use infection control measures (e.g., wear mask, isolate, social distance, avoid sharing personal items, clean and disinfect "high touch" surfaces, and frequent handwashing) according to CDC guidelines.   5. The patient or parent/caregiver has the option to accept or refuse COVID monoclonal antibody treatment.  After reviewing this information with the patient, The patient agreed to proceed with receiving casirivimab\imdevimab infusion and  will be provided a copy of the Fact sheet prior to receiving the infusion.  Sx onset 8/14. Set up for infusion on 8/22 @ 10:30AM. Directions given to [facility]. Pt is aware that insurance will be charged an infusion fee. 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 She was tested through [facility].   [name] Form 04/09/2020 11:44 AM

## 2020-04-10 ENCOUNTER — Ambulatory Visit (HOSPITAL_COMMUNITY)
Admission: RE | Admit: 2020-04-10 | Discharge: 2020-04-10 | Disposition: A | Discharge status: Home / Self Care | Payer: 59 | Source: Ambulatory Visit | Attending: Pulmonary Disease | Admitting: Pulmonary Disease

## 2020-04-10 DIAGNOSIS — Z6825 Body mass index (BMI) 25.0-25.9, adult: Secondary | ICD-10-CM

## 2020-04-10 DIAGNOSIS — U071 COVID-19: Secondary | ICD-10-CM | POA: Diagnosis not present

## 2020-04-10 MED ORDER — ONDANSETRON HCL 4 MG/2ML IJ SOLN
4.0000 mg | Freq: Once | INTRAMUSCULAR | Status: AC
  Administered 2020-04-10: 4 mg via INTRAVENOUS
  Filled 2020-04-10: qty 2

## 2020-04-10 MED ORDER — DIPHENHYDRAMINE HCL 50 MG/ML IJ SOLN: 50.0000 mg | Freq: Once | INTRAMUSCULAR | Status: DC | PRN

## 2020-04-10 MED ORDER — SODIUM CHLORIDE 0.9 % IV SOLN: INTRAVENOUS | Status: DC | PRN

## 2020-04-10 MED ORDER — EPINEPHRINE 0.3 MG/0.3ML IJ SOAJ: 0.3000 mg | Freq: Once | INTRAMUSCULAR | Status: DC | PRN

## 2020-04-10 MED ORDER — SODIUM CHLORIDE 0.9 % IV SOLN
1200.0000 mg | Freq: Once | INTRAVENOUS | Status: AC
  Administered 2020-04-10: 1200 mg via INTRAVENOUS
  Filled 2020-04-10: qty 10

## 2020-04-10 MED ORDER — METHYLPREDNISOLONE SODIUM SUCC 125 MG IJ SOLR: 125.0000 mg | Freq: Once | INTRAMUSCULAR | Status: DC | PRN

## 2020-04-10 MED ORDER — ALBUTEROL SULFATE HFA 108 (90 BASE) MCG/ACT IN AERS: 2.0000 | INHALATION_SPRAY | Freq: Once | RESPIRATORY_TRACT | Status: DC | PRN

## 2020-04-10 MED ORDER — FAMOTIDINE IN NACL 20-0.9 MG/50ML-% IV SOLN: 20.0000 mg | Freq: Once | INTRAVENOUS | Status: DC | PRN

## 2020-04-10 NOTE — Discharge Instructions (Signed)

## 2020-04-10 NOTE — Progress Notes (Signed)
  Diagnosis: COVID-19  Physician: Dr. Asencion Noble  Procedure: [facility] Med: casirivimab\imdevimab infusion - Provided patient with casirivimab\imdevimab fact sheet for patients, parents and caregivers prior to infusion.  Complications: No immediate complications noted.  Discharge: Discharged home   Jamie [name], Cathlyn Parsons 04/10/2020

## 2020-04-13 ENCOUNTER — Other Ambulatory Visit: Payer: Self-pay

## 2020-04-13 ENCOUNTER — Telehealth (INDEPENDENT_AMBULATORY_CARE_PROVIDER_SITE_OTHER): Payer: Self-pay | Admitting: Gastroenterology

## 2020-04-13 DIAGNOSIS — Z1211 Encounter for screening for malignant neoplasm of colon: Secondary | ICD-10-CM

## 2020-04-13 MED ORDER — NA SULFATE-K SULFATE-MG SULF 17.5-3.13-1.6 GM/177ML PO SOLN: 1.0000 | Freq: Once | ORAL | 0 refills | Status: DC

## 2020-04-13 MED FILL — SUPREP BOWEL PREP KIT: 17.5-3.13-1 | 1 days supply | Qty: 354 | Fill #0

## 2020-04-13 MED FILL — busPIRone HCL 15 MG TABS: 15 | 30 days supply | Qty: 90 | Fill #0

## 2020-04-13 NOTE — Progress Notes (Signed)
Gastroenterology Pre-Procedure Review  Request Date: Monday 05/16/20 Requesting Physician: Dr. Marius Ditch  PATIENT REVIEW QUESTIONS: The patient responded to the following health history questions as indicated:    1. Are you having any GI issues? no 2. Do you have a personal history of Polyps? no 3. Do you have a family history of Colon Cancer or Polyps? no 4. Diabetes Mellitus? no 5. Joint replacements in the past 12 months?no 6. Major health problems in the past 3 months?no 7. Any artificial heart valves, MVP, or defibrillator?no    MEDICATIONS & ALLERGIES:    Patient reports the following regarding taking any anticoagulation/antiplatelet therapy:   Plavix, Coumadin, Eliquis, Xarelto, Lovenox, Pradaxa, Brilinta, or Effient? no Aspirin? no  Patient confirms/reports the following medications:  Current Outpatient Medications  Medication Sig Dispense Refill  . albuterol (VENTOLIN HFA) 108 (90 Base) MCG/ACT inhaler Inhale 2 puffs into the lungs every 6 (six) hours as needed for wheezing or shortness of breath. 8 g 0  . Ascorbic Acid (VITAMIN C PO) Take by mouth.    . benzonatate (TESSALON) 200 MG capsule Take 1 capsule (200 mg total) by mouth 2 (two) times daily as needed for cough. 20 capsule 0  . busPIRone (BUSPAR) 15 MG tablet Take 1 tablet (15 mg total) by mouth 3 (three) times daily. 90 tablet 1  . cholecalciferol (VITAMIN D) 1000 UNITS tablet Take 1,000 Units by mouth daily.    . clonazePAM (KLONOPIN) 0.5 MG tablet Take 1 tablet (0.5 mg total) by mouth 2 (two) times daily as needed for anxiety. 60 tablet 2  . Crisaborole (EUCRISA) 2 % OINT Apply 1 application topically 2 (two) times daily as needed. 60 g 6  . fluticasone (FLONASE) 50 MCG/ACT nasal spray Place 1 spray into both nostrils daily as needed for allergies.     Marland Kitchen gabapentin (NEURONTIN) 600 MG tablet Take 600 mg by mouth 3 (three) times daily.     . meloxicam (MOBIC) 7.5 MG tablet Take 7.5 mg by mouth 2 (two) times daily.    .  metaxalone (SKELAXIN) 800 MG tablet Take 800 mg by mouth 3 (three) times daily.    . montelukast (SINGULAIR) 10 MG tablet Take 1 tablet (10 mg total) by mouth at bedtime. 90 tablet 3  . morphine (KADIAN) 30 MG 24 hr capsule Take 30 mg by mouth daily.    . Multiple Vitamin (MULTIVITAMIN ADULT PO) Take by mouth daily.    . Multiple Vitamins-Minerals (ZINC PO) Take by mouth.    . oxyCODONE (ROXICODONE) 15 MG immediate release tablet Take 15 mg by mouth every 6 (six) hours as needed.   0  . QUEtiapine (SEROQUEL) 50 MG tablet Take 1 tablet (50 mg total) by mouth at bedtime. 90 tablet 1  . triamcinolone cream (KENALOG) 0.1 % Apply 1 application topically 2 (two) times daily. 60 g 6  . chlorpheniramine-HYDROcodone (TUSSIONEX PENNKINETIC ER) 10-8 MG/5ML SUER Take 5 mLs by mouth every 12 (twelve) hours as needed for up to 5 days. (Patient not taking: Reported on 04/13/2020) 50 mL 0  . Na Sulfate-K Sulfate-Mg Sulf 17.5-3.13-1.6 GM/177ML SOLN Take 1 kit by mouth once for 1 dose. 354 mL 0   No current facility-administered medications for this visit.    Patient confirms/reports the following allergies:  Allergies  Allergen Reactions  . Shellfish Allergy Hives  . Flexeril [Cyclobenzaprine] Hives    RAPID HEARTBEAT    No orders of the defined types were placed in this encounter.   AUTHORIZATION INFORMATION  Primary Insurance: 1D#: Group #:  Secondary Insurance: 1D#: Group #:  SCHEDULE INFORMATION: Date: 05/16/20 Time: Location:[facility]

## 2020-04-14 ENCOUNTER — Encounter: Payer: Self-pay | Admitting: Nurse Practitioner

## 2020-04-14 ENCOUNTER — Telehealth (INDEPENDENT_AMBULATORY_CARE_PROVIDER_SITE_OTHER): Payer: 59 | Admitting: Nurse Practitioner

## 2020-04-14 DIAGNOSIS — U071 COVID-19: Secondary | ICD-10-CM | POA: Insufficient documentation

## 2020-04-14 MED ORDER — DEXTROMETHORPHAN HBR 15 MG/5ML PO SYRP: 10.0000 mL | ORAL_SOLUTION | Freq: Four times a day (QID) | ORAL | 0 refills | Status: DC | PRN

## 2020-04-14 NOTE — Progress Notes (Signed)
LMP 06/20/2016 (Approximate)   SpO2 98%    Subjective:    Patient ID: Jamie [name], female    DOB: [birth year], 49 y.o.   MRN: [id number]  HPI: Jamie [name] is a 49 y.o. female presenting for upper respiratory symptoms.  Chief Complaint  Patient presents with  . Follow-up    first day out of work 04/04/20 for [employer], not sure when she can go back to work. States the tessalon is not helping her cough   UPPER RESPIRATORY TRACT INFECTION Was told that she needed to make a follow up with her PCP from [place] and was told that she cannot go back to work until they call her and release her. Is worried about her father declining and probability that she may need to be out of work to care for him.  Onset: 04/02/2020 - woke up with upper respiratory symptoms.  Reports now, she is up and down.   Worst symptom: dizziness, body aches, cough Fever: no Cough: yes  - productive Shortness of breath: no Wheezing: no Chest pain: no Chest tightness: no Chest congestion: no Nasal congestion: yes Runny nose: no Post nasal drip: yes Sneezing: no Sore throat: yes - thinks from coughing Swollen glands: no Sinus pressure: yes Headache: yes Face pain: no Toothache: no Ear pain: yes bilateral Ear pressure: yes bilateral Eyes red/itching:no Eye drainage/crusting: no  Decreased appetite: no Nausea: yes Vomiting: no  Diarrhea: no Rash: no Fatigue: yes Sick contacts: yes Strep contacts: no  Context: fluctuaint Recurrent sinusitis: no Relief with OTC cold/cough medications: yes  Treatments attempted: albuterol, tessalon, azithromycin, prednisone, guaifenesin    Allergies  Allergen Reactions  . Shellfish Allergy Hives  . Flexeril [Cyclobenzaprine] Hives    RAPID HEARTBEAT   Outpatient Encounter Medications as of 04/14/2020  Medication Sig Note  . albuterol (VENTOLIN HFA) 108 (90 Base) MCG/ACT inhaler Inhale 2 puffs into the lungs every 6 (six) hours as needed for wheezing or shortness of  breath.   . Ascorbic Acid (VITAMIN C PO) Take by mouth.   . benzonatate (TESSALON) 200 MG capsule Take 1 capsule (200 mg total) by mouth 2 (two) times daily as needed for cough.   . busPIRone (BUSPAR) 15 MG tablet Take 1 tablet (15 mg total) by mouth 3 (three) times daily.   . cholecalciferol (VITAMIN D) 1000 UNITS tablet Take 1,000 Units by mouth daily.   . clonazePAM (KLONOPIN) 0.5 MG tablet Take 1 tablet (0.5 mg total) by mouth 2 (two) times daily as needed for anxiety.   Stasia Cavalier (EUCRISA) 2 % OINT Apply 1 application topically 2 (two) times daily as needed.   . fluticasone (FLONASE) 50 MCG/ACT nasal spray Place 1 spray into both nostrils daily as needed for allergies.    Marland Kitchen gabapentin (NEURONTIN) 600 MG tablet Take 600 mg by mouth 3 (three) times daily.    . meloxicam (MOBIC) 7.5 MG tablet Take 7.5 mg by mouth 2 (two) times daily.   . metaxalone (SKELAXIN) 800 MG tablet Take 800 mg by mouth 3 (three) times daily.   . montelukast (SINGULAIR) 10 MG tablet Take 1 tablet (10 mg total) by mouth at bedtime.   Marland Kitchen morphine (KADIAN) 30 MG 24 hr capsule Take 30 mg by mouth daily.   . Multiple Vitamin (MULTIVITAMIN ADULT PO) Take by mouth daily.   . Multiple Vitamins-Minerals (ZINC PO) Take by mouth.   . oxyCODONE (ROXICODONE) 15 MG immediate release tablet Take 15 mg by mouth every 6 (six) hours as  needed.  03/26/2016: Received from: External Pharmacy  . QUEtiapine (SEROQUEL) 50 MG tablet Take 1 tablet (50 mg total) by mouth at bedtime. 04/13/2020: As Needed  . triamcinolone cream (KENALOG) 0.1 % Apply 1 application topically 2 (two) times daily.   . [EXPIRED] chlorpheniramine-HYDROcodone (TUSSIONEX PENNKINETIC ER) 10-8 MG/5ML SUER Take 5 mLs by mouth every 12 (twelve) hours as needed for up to 5 days. (Patient not taking: Reported on 04/13/2020)   . dextromethorphan 15 MG/5ML syrup Take 10 mLs (30 mg total) by mouth 4 (four) times daily as needed for cough.   . [DISCONTINUED] azithromycin (ZITHROMAX)  250 MG tablet 2 tabs today, then 1 tab daily for 4 days   . [DISCONTINUED] predniSONE (DELTASONE) 10 MG tablet 6 tabs today, 5 tabs tomorrow, decrease by 1 daily until gone    No facility-administered encounter medications on file as of 04/14/2020.   Patient Active Problem List   Diagnosis Date Noted  . COVID-19 04/14/2020  . Allergic rhinitis 05/11/2019  . Insomnia 08/24/2017  . Chronic back pain 10/16/2016  . 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 Eczema 05/09/2015  . Anxiety 05/09/2015  . Depression, recurrent ([place]) 03/08/2015  . Spinal stenosis of lumbar region 09/01/2014   Past Medical History:  Diagnosis Date  . Anxiety    takes Ativan daily.  Panic Attack  . Arthritis   . Chronic back pain    herniated disc/stenosis/scoliosis  . Depression    takes Effexor daily  . Eczema    uses a cream daily as needed  . Family history of adverse reaction to anesthesia    pta dad is very hard to wake up;excessive nausea  . History of bronchitis    > 2yr ago  . Hypoglycemia   . Insomnia    takes Lorazepam nightly  . Joint pain   . PONV (postoperative nausea and vomiting)   . Seasonal allergies    uses Flonase daily  . Vitamin D deficiency    takes Vit D daily   Relevant past medical, surgical, family and social history reviewed and updated as indicated. Interim medical history since our last visit reviewed.  Review of Systems  Constitutional: Positive for activity change, appetite change, chills and fatigue. Negative for fever.  HENT: Positive for congestion, ear pain, sinus pressure and sore throat. Negative for postnasal drip, rhinorrhea, sinus pain and sneezing.   Eyes: Negative.   Respiratory: Positive for cough (productive). Negative for chest tightness, shortness of breath and wheezing.   Cardiovascular: Negative.  Negative for chest pain.  Gastrointestinal: Positive for nausea. Negative for constipation, diarrhea and vomiting.  Musculoskeletal: Positive for  myalgias.  Skin: Negative.   Neurological: Negative.   Hematological: Negative.   Psychiatric/Behavioral: Negative.     Per HPI unless specifically indicated above     Objective:    LMP 06/20/2016 (Approximate)   SpO2 98%   Wt Readings from Last 3 Encounters:  04/04/20 157 lb (71.2 kg)  01/26/20 158 lb (71.7 kg)  11/04/19 175 lb (79.4 kg)    Physical Exam Vitals and nursing note reviewed.  Constitutional:      General: She is not in acute distress.    Appearance: Normal appearance. She is not ill-appearing or toxic-appearing.  HENT:     Head: Normocephalic and atraumatic.     Right Ear: External ear normal.     Left Ear: External ear normal.     Nose: Congestion present. No rhinorrhea.     Mouth/Throat:  Mouth: Mucous membranes are moist.     Pharynx: Oropharynx is clear.  Eyes:     General: No scleral icterus.       Right eye: No discharge.        Left eye: No discharge.     Extraocular Movements: Extraocular movements intact.  Cardiovascular:     Comments: Unable to assess heart sounds via virtual visit Pulmonary:     Effort: Pulmonary effort is normal. No respiratory distress.     Comments: Unable to assess lung sounds via virtual visit - talking in complete sentences Abdominal:     Comments: Unable to assess bowel sounds via virtual visit  Skin:    Coloration: Skin is not jaundiced or pale.     Findings: No erythema.  Neurological:     General: No focal deficit present.     Mental Status: She is alert and oriented to person, place, and time.  Psychiatric:        Mood and Affect: Mood normal.        Behavior: Behavior normal.        Thought Content: Thought content normal.        Judgment: Judgment normal.        Assessment & Plan:   Problem List Items Addressed This Visit      Other   COVID-19 - Primary    Acute, symptoms ongoing but improving.  Cough worse at night, will start Delsym q4 hours for cough.  Discussed isolation discontinuance  criteria for back to work and continue to [employer] [facility] [employer] for when she can return to work.  Will fill out FMLA paperwork.          Follow up plan: Return if symptoms worsen or fail to improve.   Due to the catastrophic nature of the COVID-19 pandemic, this visit was completed via audio and visual contact via mychart due to the restrictions of the COVID-19 pandemic. All issues as above were discussed and addressed. Physical exam was done as above through visual confirmation on Mychart. If it was felt that the patient should be evaluated in the office, they were directed there. The patient verbally consented to this visit."} . Location of the patient: home . Location of the provider: work . Those involved with this call:  . Provider: Carnella Guadalajara, DNP . CMA: Yvonna Alanis, CMA . Front Desk/Registration: [facility]  . Time spent on call: 15 minutes with patient face to face via video conference. More than 50% of this time was spent in counseling and coordination of care. 30 minutes total spent in review of patient's record and preparation of their chart.  I verified patient identity using two factors (patient name and date of birth). Patient consents verbally to being seen via telemedicine visit today.

## 2020-04-14 NOTE — Assessment & Plan Note (Signed)
Acute, symptoms ongoing but improving.  Cough worse at night, will start Delsym q4 hours for cough.  Discussed isolation discontinuance criteria for back to work and continue to [employer] for when she can return to work.  Will fill out FMLA paperwork.

## 2020-04-14 NOTE — Patient Instructions (Signed)
    Person Under Monitoring Name: Jamie [name]  Location: [place] [address]   CORONAVIRUS DISEASE 2019 (COVID-19) Guidance for Persons Under Investigation You are being tested for the virus that causes coronavirus disease 2019 (COVID-19). Public health actions are necessary to ensure protection of your health and the health of others, and to prevent further spread of infection. COVID-19 is caused by a virus that can cause symptoms, such as fever, cough, and shortness of breath. The primary transmission from person to person is by coughing or sneezing. On September 18, 2018, the [facility] announced a [employer] Emergency of International Concern and on September 19, 2018 the U.S. Department of Health and Human Services declared a public health emergency. If the virus that causesCOVID-19 spreads in the community, it could have severe public health consequences.  As a person under investigation for COVID-19, the [place] advises you to adhere to the following guidance until your test results are reported to you. If your test result is positive, you will receive additional information from your provider and your local health department at that time.   Remain at home until you are cleared by your health provider or public health authorities.   Keep a log of visitors to your home using the form provided. Any visitors to your home must be aware of your isolation status.  If you plan to move to a new address or leave the county, notify the local health department in your county.  Call a doctor or seek care if you have an urgent medical need. Before seeking medical care, call ahead and get instructions from the provider before arriving at the medical office, clinic or hospital. Notify them that you are being tested for the virus that causes COVID-19 so arrangements can be made, as necessary, to  prevent transmission to others in the healthcare setting. Next, notify the local health department in your county.  If a medical emergency arises and you need to call 911, inform the first responders that you are being tested for the virus that causes COVID-19. Next, notify the local health department in your county.  Adhere to all guidance set forth by the [place] for [facility] of patients that is based on guidance from the Center for Disease Control and Prevention with suspected or confirmed COVID-19. It is provided with this guidance for Persons Under Investigation.  Your health and the health of our community are our top priorities. Public Health officials remain available to provide assistance and counseling to you about COVID-19 and compliance with this guidance.  Provider: ____________________________________________________________ Date: ______/_____/_________  By signing below, you acknowledge that you have read and agree to comply with this Guidance for Persons Under Investigation. ______________________________________________________________ Date: ______/_____/_________  WHO DO I CALL? You can find a list of local health departments here: [email or web address] Health Department: ____________________________________________________________________ Contact Name: ________________________________________________________________________ Telephone: ___________________________________________________________________________  Marice Potter, [place], Communicable Disease Branch COVID-19 Guidance for Persons Under Investigation October 25, 2018

## 2020-04-18 ENCOUNTER — Other Ambulatory Visit: Payer: Self-pay

## 2020-04-18 ENCOUNTER — Encounter: Payer: Self-pay | Admitting: Nurse Practitioner

## 2020-04-18 DIAGNOSIS — Z1211 Encounter for screening for malignant neoplasm of colon: Secondary | ICD-10-CM

## 2020-04-20 ENCOUNTER — Ambulatory Visit: Payer: 59 | Admitting: Licensed Clinical Social Worker

## 2020-04-20 MED FILL — METAXALONE 800 MG TABS: 800 | 30 days supply | Qty: 120 | Fill #2

## 2020-04-20 MED FILL — oxyCODONE HCL 15 MG TABS: 15 | 16 days supply | Qty: 64 | Fill #0

## 2020-04-20 MED FILL — MORPHINE SULF ER 30 MG TAB: 30 | 16 days supply | Qty: 16 | Fill #0

## 2020-04-20 MED FILL — GABAPENTIN 600 MG TABLET: 600 | 30 days supply | Qty: 90 | Fill #2

## 2020-04-20 MED FILL — MELOXICAM 7.5 MG TABLET: 7.5 | 30 days supply | Qty: 60 | Fill #2

## 2020-04-21 ENCOUNTER — Telehealth (INDEPENDENT_AMBULATORY_CARE_PROVIDER_SITE_OTHER): Payer: 59 | Admitting: Nurse Practitioner

## 2020-04-21 ENCOUNTER — Encounter: Payer: Self-pay | Admitting: Nurse Practitioner

## 2020-04-21 DIAGNOSIS — U071 COVID-19: Secondary | ICD-10-CM | POA: Diagnosis not present

## 2020-04-21 NOTE — Progress Notes (Signed)
LMP 06/20/2016 (Approximate)    Subjective:    Patient ID: Jamie [name], female    DOB: January 20, 1971, 49 y.o.   MRN: [id number]  HPI: Jamie [name] is a 49 y.o. female  Chief Complaint  Patient presents with  . Work Release    pt states she is getting better, still takes the cough syrup PRN    . This visit was completed via MyChart due to the restrictions of the COVID-19 pandemic. All issues as above were discussed and addressed. Physical exam was done as above through visual confirmation on MyChart. If it was felt that the patient should be evaluated in the office, they were directed there. The patient verbally consented to this visit. . Location of the patient: home . Location of the provider: work . Those involved with this call:  . Provider: Marnee Guarneri, DNP . CMA: Yvonna Alanis, CMA . Front Desk/Registration: Don Perking  . Time spent on call: 20 minutes with patient face to face via video conference. More than 50% of this time was spent in counseling and coordination of care. 15 minutes total spent in review of patient's record and preparation of their chart.  . I verified patient identity using two factors (patient name and date of birth). Patient consents verbally to being seen via telemedicine visit today.    COVID POSITIVE: Tested positive on 16th or 17th.  She did have MAB infusion on 04/10/20.  At this time she reports feeling "a lot better".  Reports infusion really helped.  Energy level continues to be up and down + having minimal nausea at times and decreased appetite.  Was vaccinated in January.  Feels overall improvement and that she can return to work.   Fever: no Cough: occasional, but improved Shortness of breath: no Wheezing: no Chest pain: no Chest tightness: no Chest congestion: no Nasal congestion: occasional, improving Runny nose: no Post nasal drip: no Sneezing: no Sore throat: no Swollen glands: no Sinus pressure: no Headache:  no Face pain: no Toothache: no Ear pain: none Ear pressure: none Eyes red/itching:no Eye drainage/crusting: no  Vomiting: no Rash: no Fatigue: yes Sick contacts: no Strep contacts: no  Context: better Recurrent sinusitis: no Relief with OTC cold/cough medications: supplements  Treatments attempted: MAB infusion, supplements  Relevant past medical, surgical, family and social history reviewed and updated as indicated. Interim medical history since our last visit reviewed. Allergies and medications reviewed and updated.  Review of Systems  Constitutional: Positive for fatigue. Negative for activity change, diaphoresis and fever.  HENT: Positive for voice change. Negative for congestion, ear discharge, ear pain, postnasal drip, rhinorrhea, sinus pressure, sinus pain and sore throat.   Respiratory: Positive for cough (occasional improved). Negative for chest tightness and wheezing.   Cardiovascular: Negative.   Gastrointestinal: Negative.   Neurological: Negative.   Psychiatric/Behavioral: Negative.     Per HPI unless specifically indicated above     Objective:    LMP 06/20/2016 (Approximate)   Wt Readings from Last 3 Encounters:  04/04/20 157 lb (71.2 kg)  01/26/20 158 lb (71.7 kg)  11/04/19 175 lb (79.4 kg)    Physical Exam Vitals and nursing note reviewed.  Constitutional:      General: She is awake. She is not in acute distress.    Appearance: She is well-developed. She is not ill-appearing.  HENT:     Head: Normocephalic.     Right Ear: Hearing normal.     Left Ear: Hearing normal.  Eyes:  General: Lids are normal.        Right eye: No discharge.        Left eye: No discharge.     Conjunctiva/sclera: Conjunctivae normal.  Pulmonary:     Effort: Pulmonary effort is normal. No accessory muscle usage or respiratory distress.  Musculoskeletal:     Cervical back: Normal range of motion.  Neurological:     Mental Status: She is alert and oriented to person,  place, and time.  Psychiatric:        Attention and Perception: Attention normal.        Mood and Affect: Mood normal.        Behavior: Behavior normal. Behavior is cooperative.        Thought Content: Thought content normal.        Judgment: Judgment normal.     Results for orders placed or performed in visit on 04/04/20  Microscopic Examination   Urine  Result Value Ref Range   WBC, UA 0-5 0 - 5 /hpf   RBC None seen 0 - 2 /hpf   Epithelial Cells (non renal) 0-10 0 - 10 /hpf   Bacteria, UA None seen None seen/Few  Urine Culture, Reflex   Urine  Result Value Ref Range   Urine Culture, Routine Final report    Organism ID, Bacteria Comment   Hepatitis C antibody  Result Value Ref Range   Hep C Virus Ab <0.1 0.0 - 0.9 s/co ratio  CBC with Differential/Platelet  Result Value Ref Range   WBC 5.1 3.4 - 10.8 x10E3/uL   RBC 4.27 3.77 - 5.28 x10E6/uL   Hemoglobin 13.5 11.1 - 15.9 g/dL   Hematocrit 41.9 34.0 - 46.6 %   MCV 98 (H) 79 - 97 fL   MCH 31.6 26.6 - 33.0 pg   MCHC 32.2 31 - 35 g/dL   RDW 12.4 11.7 - 15.4 %   Platelets 211 150 - 450 x10E3/uL   Neutrophils 54 Not Estab. %   Lymphs 35 Not Estab. %   Monocytes 11 Not Estab. %   Eos 0 Not Estab. %   Basos 0 Not Estab. %   Neutrophils Absolute 2.7 1 - 7 x10E3/uL   Lymphocytes Absolute 1.8 0 - 3 x10E3/uL   Monocytes Absolute 0.6 0 - 0 x10E3/uL   EOS (ABSOLUTE) 0.0 0.0 - 0.4 x10E3/uL   Basophils Absolute 0.0 0 - 0 x10E3/uL   Immature Granulocytes 0 Not Estab. %   Immature Grans (Abs) 0.0 0.0 - 0.1 x10E3/uL  Comprehensive metabolic panel  Result Value Ref Range   Glucose 96 65 - 99 mg/dL   BUN 12 6 - 24 mg/dL   Creatinine, Ser 0.70 0.57 - 1.00 mg/dL   GFR calc non Af Amer 101 >59 mL/min/1.73   GFR calc Af Amer 117 >59 mL/min/1.73   BUN/Creatinine Ratio 17 9 - 23   Sodium 140 134 - 144 mmol/L   Potassium 5.1 3.5 - 5.2 mmol/L   Chloride 102 96 - 106 mmol/L   CO2 25 20 - 29 mmol/L   Calcium 9.7 8.7 - 10.2 mg/dL    Total Protein 7.3 6.0 - 8.5 g/dL   Albumin 4.6 3.8 - 4.8 g/dL   Globulin, Total 2.7 1.5 - 4.5 g/dL   Albumin/Globulin Ratio 1.7 1.2 - 2.2   Bilirubin Total <0.2 0.0 - 1.2 mg/dL   Alkaline Phosphatase 85 48 - 121 IU/L   AST 24 0 - 40 IU/L   ALT 25 0 -  32 IU/L  TSH  Result Value Ref Range   TSH 1.560 0.450 - 4.500 uIU/mL  UA/M w/rflx Culture, Routine   Specimen: Urine   Urine  Result Value Ref Range   Specific Gravity, UA <1.005 (L) 1.005 - 1.030   pH, UA 5.5 5.0 - 7.5   Color, UA Yellow Yellow   Appearance Ur Clear Clear   Leukocytes,UA Trace (A) Negative   Protein,UA Negative Negative/Trace   Glucose, UA Negative Negative   Ketones, UA Negative Negative   RBC, UA Negative Negative   Bilirubin, UA Negative Negative   Urobilinogen, Ur 0.2 0.2 - 1.0 mg/dL   Nitrite, UA Negative Negative   Microscopic Examination See below:    Urinalysis Reflex Comment   Lipid Panel w/o Chol/HDL Ratio  Result Value Ref Range   Cholesterol, Total 193 100 - 199 mg/dL   Triglycerides 54 0 - 149 mg/dL   HDL 59 >39 mg/dL   VLDL Cholesterol Cal 10 5 - 40 mg/dL   LDL Chol Calc (NIH) 124 (H) 0 - 99 mg/dL      Assessment & Plan:   Problem List Items Addressed This Visit      Other   COVID-19    Acute and improving, she is past quarantine time.  Vaccinated in January, breakthrough case,  At this time feel she is able to return to work.  Recommend she continue supplements at home, including Vitamin D3, Zinc, Vitamin C.  Recommend increase hydration and rest.  If any worsening symptoms return to office.           I discussed the assessment and treatment plan with the patient. The patient was provided an opportunity to ask questions and all were answered. The patient agreed with the plan and demonstrated an understanding of the instructions.   The patient was advised to call back or seek an in-person evaluation if the symptoms worsen or if the condition fails to improve as anticipated.   I  provided 21+ minutes of time during this encounter.  Follow up plan: Return if symptoms worsen or fail to improve.

## 2020-04-21 NOTE — Assessment & Plan Note (Signed)
Acute and improving, she is past quarantine time.  Vaccinated in January, breakthrough case,  At this time feel she is able to return to work.  Recommend she continue supplements at home, including Vitamin D3, Zinc, Vitamin C.  Recommend increase hydration and rest.  If any worsening symptoms return to office.

## 2020-04-21 NOTE — Patient Instructions (Signed)
COVID-19 COVID-19 is a respiratory infection that is caused by a virus called severe acute respiratory syndrome coronavirus 2 (SARS-CoV-2). The disease is also known as coronavirus disease or novel coronavirus. In some people, the virus may not cause any symptoms. In others, it may cause a serious infection. The infection can get worse quickly and can lead to complications, such as:  Pneumonia, or infection of the lungs.  Acute respiratory distress syndrome or ARDS. This is a condition in which fluid build-up in the lungs prevents the lungs from filling with air and passing oxygen into the blood.  Acute respiratory failure. This is a condition in which there is not enough oxygen passing from the lungs to the body or when carbon dioxide is not passing from the lungs out of the body.  Sepsis or septic shock. This is a serious bodily reaction to an infection.  Blood clotting problems.  Secondary infections due to bacteria or fungus.  Organ failure. This is when your body's organs stop working. The virus that causes COVID-19 is contagious. This means that it can spread from person to person through droplets from coughs and sneezes (respiratory secretions). What are the causes? This illness is caused by a virus. You may catch the virus by:  Breathing in droplets from an infected person. Droplets can be spread by a person breathing, speaking, singing, coughing, or sneezing.  Touching something, like a table or a doorknob, that was exposed to the virus (contaminated) and then touching your mouth, nose, or eyes. What increases the risk? Risk for infection You are more likely to be infected with this virus if you:  Are within 6 feet (2 meters) of a person with COVID-19.  Provide care for or live with a person who is infected with COVID-19.  Spend time in crowded indoor spaces or live in shared housing. Risk for serious illness You are more likely to become seriously ill from the virus if you:   Are 50 years of age or older. The higher your age, the more you are at risk for serious illness.  Live in a nursing home or long-term care facility.  Have cancer.  Have a long-term (chronic) disease such as: ? Chronic lung disease, including chronic obstructive pulmonary disease or asthma. ? A long-term disease that lowers your body's ability to fight infection (immunocompromised). ? Heart disease, including heart failure, a condition in which the arteries that lead to the heart become narrow or blocked (coronary artery disease), a disease which makes the heart muscle thick, weak, or stiff (cardiomyopathy). ? Diabetes. ? Chronic kidney disease. ? Sickle cell disease, a condition in which red blood cells have an abnormal "sickle" shape. ? Liver disease.  Are obese. What are the signs or symptoms? Symptoms of this condition can range from mild to severe. Symptoms may appear any time from 2 to 14 days after being exposed to the virus. They include:  A fever or chills.  A cough.  Difficulty breathing.  Headaches, body aches, or muscle aches.  Runny or stuffy (congested) nose.  A sore throat.  New loss of taste or smell. Some people may also have stomach problems, such as nausea, vomiting, or diarrhea. Other people may not have any symptoms of COVID-19. How is this diagnosed? This condition may be diagnosed based on:  Your signs and symptoms, especially if: ? You live in an area with a COVID-19 outbreak. ? You recently traveled to or from an area where the virus is common. ? You   provide care for or live with a person who was diagnosed with COVID-19. ? You were exposed to a person who was diagnosed with COVID-19.  A physical exam.  Lab tests, which may include: ? Taking a sample of fluid from the back of your nose and throat (nasopharyngeal fluid), your nose, or your throat using a swab. ? A sample of mucus from your lungs (sputum). ? Blood tests.  Imaging tests, which  may include, X-rays, CT scan, or ultrasound. How is this treated? At present, there is no medicine to treat COVID-19. Medicines that treat other diseases are being used on a trial basis to see if they are effective against COVID-19. Your health care provider will talk with you about ways to treat your symptoms. For most people, the infection is mild and can be managed at home with rest, fluids, and over-the-counter medicines. Treatment for a serious infection usually takes places in a hospital intensive care unit (ICU). It may include one or more of the following treatments. These treatments are given until your symptoms improve.  Receiving fluids and medicines through an IV.  Supplemental oxygen. Extra oxygen is given through a tube in the nose, a face mask, or a hood.  Positioning you to lie on your stomach (prone position). This makes it easier for oxygen to get into the lungs.  Continuous positive airway pressure (CPAP) or bi-level positive airway pressure (BPAP) machine. This treatment uses mild air pressure to keep the airways open. A tube that is connected to a motor delivers oxygen to the body.  Ventilator. This treatment moves air into and out of the lungs by using a tube that is placed in your windpipe.  Tracheostomy. This is a procedure to create a hole in the neck so that a breathing tube can be inserted.  Extracorporeal membrane oxygenation (ECMO). This procedure gives the lungs a chance to recover by taking over the functions of the heart and lungs. It supplies oxygen to the body and removes carbon dioxide. Follow these instructions at home: Lifestyle  If you are sick, stay home except to get medical care. Your health care provider will tell you how long to stay home. Call your health care provider before you go for medical care.  Rest at home as told by your health care provider.  Do not use any products that contain nicotine or tobacco, such as cigarettes, e-cigarettes, and  chewing tobacco. If you need help quitting, ask your health care provider.  Return to your normal activities as told by your health care provider. Ask your health care provider what activities are safe for you. General instructions  Take over-the-counter and prescription medicines only as told by your health care provider.  Drink enough fluid to keep your urine pale yellow.  Keep all follow-up visits as told by your health care provider. This is important. How is this prevented?  There is no vaccine to help prevent COVID-19 infection. However, there are steps you can take to protect yourself and others from this virus. To protect yourself:   Do not travel to areas where COVID-19 is a risk. The areas where COVID-19 is reported change often. To identify high-risk areas and travel restrictions, check the CDC travel website: wwwnc.cdc.gov/travel/notices  If you live in, or must travel to, an area where COVID-19 is a risk, take precautions to avoid infection. ? Stay away from people who are sick. ? Wash your hands often with soap and water for 20 seconds. If soap and water   are not available, use an alcohol-based hand sanitizer. ? Avoid touching your mouth, face, eyes, or nose. ? Avoid going out in public, follow guidance from your state and local health authorities. ? If you must go out in public, wear a cloth face covering or face mask. Make sure your mask covers your nose and mouth. ? Avoid crowded indoor spaces. Stay at least 6 feet (2 meters) away from others. ? Disinfect objects and surfaces that are frequently touched every day. This may include:  Counters and tables.  Doorknobs and light switches.  Sinks and faucets.  Electronics, such as phones, remote controls, keyboards, computers, and tablets. To protect others: If you have symptoms of COVID-19, take steps to prevent the virus from spreading to others.  If you think you have a COVID-19 infection, contact your health care  provider right away. Tell your health care team that you think you may have a COVID-19 infection.  Stay home. Leave your house only to seek medical care. Do not use public transport.  Do not travel while you are sick.  Wash your hands often with soap and water for 20 seconds. If soap and water are not available, use alcohol-based hand sanitizer.  Stay away from other members of your household. Let healthy household members care for children and pets, if possible. If you have to care for children or pets, wash your hands often and wear a mask. If possible, stay in your own room, separate from others. Use a different bathroom.  Make sure that all people in your household wash their hands well and often.  Cough or sneeze into a tissue or your sleeve or elbow. Do not cough or sneeze into your hand or into the air.  Wear a cloth face covering or face mask. Make sure your mask covers your nose and mouth. Where to find more information  Centers for Disease Control and Prevention: www.cdc.gov/coronavirus/2019-ncov/index.html  World Health Organization: www.who.int/health-topics/coronavirus Contact a health care provider if:  You live in or have traveled to an area where COVID-19 is a risk and you have symptoms of the infection.  You have had contact with someone who has COVID-19 and you have symptoms of the infection. Get help right away if:  You have trouble breathing.  You have pain or pressure in your chest.  You have confusion.  You have bluish lips and fingernails.  You have difficulty waking from sleep.  You have symptoms that get worse. These symptoms may represent a serious problem that is an emergency. Do not wait to see if the symptoms will go away. Get medical help right away. Call your local emergency services (911 in the [place] Do not drive yourself to the hospital. Let the emergency medical personnel know if you think you have COVID-19. Summary  COVID-19 is a  respiratory infection that is caused by a virus. It is also known as coronavirus disease or novel coronavirus. It can cause serious infections, such as pneumonia, acute respiratory distress syndrome, acute respiratory failure, or sepsis.  The virus that causes COVID-19 is contagious. This means that it can spread from person to person through droplets from breathing, speaking, singing, coughing, or sneezing.  You are more likely to develop a serious illness if you are 50 years of age or older, have a weak immune system, live in a nursing home, or have chronic disease.  There is no medicine to treat COVID-19. Your health care provider will talk with you about ways to treat your symptoms.    Take steps to protect yourself and others from infection. Wash your hands often and disinfect objects and surfaces that are frequently touched every day. Stay away from people who are sick and wear a mask if you are sick. This information is not intended to replace advice given to you by your health care provider. Make sure you discuss any questions you have with your health care provider. Document Revised: 06/05/2019 Document Reviewed: 09/11/2018 [employer] Patient Education  2020 [employer]  

## 2020-04-26 ENCOUNTER — Telehealth (INDEPENDENT_AMBULATORY_CARE_PROVIDER_SITE_OTHER): Payer: 59 | Admitting: Psychiatry

## 2020-04-26 ENCOUNTER — Other Ambulatory Visit: Payer: Self-pay

## 2020-04-26 ENCOUNTER — Encounter: Payer: Self-pay | Admitting: Psychiatry

## 2020-04-26 DIAGNOSIS — F331 Major depressive disorder, recurrent, moderate: Secondary | ICD-10-CM | POA: Diagnosis not present

## 2020-04-26 DIAGNOSIS — Z9189 Other specified personal risk factors, not elsewhere classified: Secondary | ICD-10-CM | POA: Diagnosis not present

## 2020-04-26 DIAGNOSIS — F411 Generalized anxiety disorder: Secondary | ICD-10-CM | POA: Diagnosis not present

## 2020-04-26 DIAGNOSIS — Z79899 Other long term (current) drug therapy: Secondary | ICD-10-CM | POA: Diagnosis not present

## 2020-04-26 DIAGNOSIS — F410 Panic disorder [episodic paroxysmal anxiety] without agoraphobia: Secondary | ICD-10-CM

## 2020-04-26 MED ORDER — PROPRANOLOL HCL 10 MG PO TABS: 10.0000 mg | ORAL_TABLET | Freq: Three times a day (TID) | ORAL | 1 refills | Status: DC | PRN

## 2020-04-26 MED ORDER — DULOXETINE HCL 30 MG PO CPEP: 30.0000 mg | ORAL_CAPSULE | Freq: Every day | ORAL | 1 refills | Status: DC
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
## 2020-04-26 NOTE — Patient Instructions (Signed)
Propranolol Tablets What is this medicine? PROPRANOLOL (proe PRAN oh lole) is a beta blocker. It decreases the amount of work your heart has to do and helps your heart beat regularly. It treats high blood pressure and/or prevent chest pain (also called angina). It is also used after a heart attack to prevent a second one. This medicine may be used for other purposes; ask your health care provider or pharmacist if you have questions. COMMON BRAND NAME(S): Inderal What should I tell my health care provider before I take this medicine? They need to know if you have any of these conditions:  circulation problems or blood vessel disease  diabetes  history of heart attack or heart disease, vasospastic angina  kidney disease  liver disease  lung or breathing disease, like asthma or emphysema  pheochromocytoma  slow heart rate  thyroid disease  an unusual or allergic reaction to propranolol, other beta-blockers, medicines, foods, dyes, or preservatives  pregnant or trying to get pregnant  breast-feeding How should I use this medicine? Take this drug by mouth. Take it as directed on the prescription label at the same time every day. Keep taking it unless your health care provider tells you to stop. Talk to your health care provider about the use of this drug in children. Special care may be needed. Overdosage: If you think you have taken too much of this medicine contact a poison control center or emergency room at once. NOTE: This medicine is only for you. Do not share this medicine with others. What if I miss a dose? If you miss a dose, take it as soon as you can. If it is almost time for your next dose, take only that dose. Do not take double or extra doses. What may interact with this medicine? Do not take this medicine with any of the following medications:  feverfew  phenothiazines like chlorpromazine, mesoridazine, prochlorperazine, thioridazine This medicine may also  interact with the following medications:  aluminum hydroxide gel  antipyrine  antiviral medicines for HIV or AIDS  barbiturates like phenobarbital  certain medicines for blood pressure, heart disease, irregular heart beat  cimetidine  ciprofloxacin  diazepam  fluconazole  haloperidol  isoniazid  medicines for cholesterol like cholestyramine or colestipol  medicines for mental depression  medicines for migraine headache like almotriptan, eletriptan, frovatriptan, naratriptan, rizatriptan, sumatriptan, zolmitriptan  NSAIDs, medicines for pain and inflammation, like ibuprofen or naproxen  phenytoin  rifampin  teniposide  theophylline  thyroid medicines  tolbutamide  warfarin  zileuton This list may not describe all possible interactions. Give your health care provider a list of all the medicines, herbs, non-prescription drugs, or dietary supplements you use. Also tell them if you smoke, drink alcohol, or use illegal drugs. Some items may interact with your medicine. What should I watch for while using this medicine? Visit your doctor or health care professional for regular check ups. Check your blood pressure and pulse rate regularly. Ask your health care professional what your blood pressure and pulse rate should be, and when you should contact them. You may get drowsy or dizzy. Do not drive, use machinery, or do anything that needs mental alertness until you know how this drug affects you. Do not stand or sit up quickly, especially if you are an older patient. This reduces the risk of dizzy or fainting spells. Alcohol can make you more drowsy and dizzy. Avoid alcoholic drinks. This medicine may increase blood sugar. Ask your healthcare provider if changes in diet or   medicines are needed if you have diabetes. Do not treat yourself for coughs, colds, or pain while you are taking this medicine without asking your doctor or health care professional for advice. Some  ingredients may increase your blood pressure. What side effects may I notice from receiving this medicine? Side effects that you should report to your doctor or health care professional as soon as possible:  allergic reactions like skin rash, itching or hives, swelling of the face, lips, or tongue  breathing problems  cold hands or feet  difficulty sleeping, nightmares  dry peeling skin  hallucinations  muscle cramps or weakness   signs and symptoms of high blood sugar such as being more thirsty or hungry or having to urinate more than normal. You may also feel very tired or have blurry vision.  slow heart rate  swelling of the legs and ankles  vomiting Side effects that usually do not require medical attention (report to your doctor or health care professional if they continue or are bothersome):  change in sex drive or performance  diarrhea  dry sore eyes  hair loss  nausea  weak or tired This list may not describe all possible side effects. Call your doctor for medical advice about side effects. You may report side effects to FDA at 1-800-FDA-1088. Where should I keep my medicine? Keep out of the reach of children and pets. Store at room temperature between 20 and 25 degrees C (68 and 77 degrees F). Protect from light. Throw away any unused drug after the expiration date. NOTE: This sheet is a summary. It may not cover all possible information. If you have questions about this medicine, talk to your doctor, pharmacist, or health care provider.  2020 Elsevier/Gold Standard (2019-03-13 19:25:51) Duloxetine delayed-release capsules What is this medicine? DULOXETINE (doo LOX e teen) is used to treat depression, anxiety, and different types of chronic pain. This medicine may be used for other purposes; ask your health care provider or pharmacist if you have questions. COMMON BRAND NAME(S): Cymbalta, Creig Hines, Irenka What should I tell my health care provider before I  take this medicine? They need to know if you have any of these conditions:  bipolar disorder  glaucoma  high blood pressure  kidney disease  liver disease  seizures  suicidal thoughts, plans or attempt; a previous suicide attempt by you or a family member  take medicines that treat or prevent blood clots  taken medicines called MAOIs like Carbex, Eldepryl, Marplan, Nardil, and Parnate within 14 days  trouble passing urine  an unusual reaction to duloxetine, other medicines, foods, dyes, or preservatives  pregnant or trying to get pregnant  breast-feeding How should I use this medicine? Take this medicine by mouth with a glass of water. Follow the directions on the prescription label. Do not crush, cut or chew some capsules of this medicine. Some capsules may be opened and sprinkled on applesauce. Check with your doctor or pharmacist if you are not sure. You can take this medicine with or without food. Take your medicine at regular intervals. Do not take your medicine more often than directed. Do not stop taking this medicine suddenly except upon the advice of your doctor. Stopping this medicine too quickly may cause serious side effects or your condition may worsen. A special MedGuide will be given to you by the pharmacist with each prescription and refill. Be sure to read this information carefully each time. Talk to your pediatrician regarding the use of this medicine in  children. While this drug may be prescribed for children as young as 69 years of age for selected conditions, precautions do apply. Overdosage: If you think you have taken too much of this medicine contact a poison control center or emergency room at once. NOTE: This medicine is only for you. Do not share this medicine with others. What if I miss a dose? If you miss a dose, take it as soon as you can. If it is almost time for your next dose, take only that dose. Do not take double or extra doses. What may  interact with this medicine? Do not take this medicine with any of the following medications:  desvenlafaxine  levomilnacipran  linezolid  MAOIs like Carbex, Eldepryl, Marplan, Nardil, and Parnate  methylene blue (injected into a vein)  milnacipran  thioridazine  venlafaxine This medicine may also interact with the following medications:  alcohol  amphetamines  aspirin and aspirin-like medicines  certain antibiotics like ciprofloxacin and enoxacin  certain medicines for blood pressure, heart disease, irregular heart beat  certain medicines for depression, anxiety, or psychotic disturbances  certain medicines for migraine headache like almotriptan, eletriptan, frovatriptan, naratriptan, rizatriptan, sumatriptan, zolmitriptan  certain medicines that treat or prevent blood clots like warfarin, enoxaparin, and dalteparin  cimetidine  fentanyl  lithium  NSAIDS, medicines for pain and inflammation, like ibuprofen or naproxen  phentermine  procarbazine  rasagiline  sibutramine  St. John's wort  theophylline  tramadol  tryptophan This list may not describe all possible interactions. Give your health care provider a list of all the medicines, herbs, non-prescription drugs, or dietary supplements you use. Also tell them if you smoke, drink alcohol, or use illegal drugs. Some items may interact with your medicine. What should I watch for while using this medicine? Tell your doctor if your symptoms do not get better or if they get worse. Visit your doctor or healthcare provider for regular checks on your progress. Because it may take several weeks to see the full effects of this medicine, it is important to continue your treatment as prescribed by your doctor. This medicine may cause serious skin reactions. They can happen weeks to months after starting the medicine. Contact your healthcare provider right away if you notice fevers or flu-like symptoms with a rash.  The rash may be red or purple and then turn into blisters or peeling of the skin. Or, you might notice a red rash with swelling of the face, lips, or lymph nodes in your neck or under your arms. Patients and their families should watch out for new or worsening thoughts of suicide or depression. Also watch out for sudden changes in feelings such as feeling anxious, agitated, panicky, irritable, hostile, aggressive, impulsive, severely restless, overly excited and hyperactive, or not being able to sleep. If this happens, especially at the beginning of treatment or after a change in dose, call your healthcare provider. You may get drowsy or dizzy. Do not drive, use machinery, or do anything that needs mental alertness until you know how this medicine affects you. Do not stand or sit up quickly, especially if you are an older patient. This reduces the risk of dizzy or fainting spells. Alcohol may interfere with the effect of this medicine. Avoid alcoholic drinks. This medicine can cause an increase in blood pressure. This medicine can also cause a sudden drop in your blood pressure, which may make you feel faint and increase the chance of a fall. These effects are most common when you first  start the medicine or when the dose is increased, or during use of other medicines that can cause a sudden drop in blood pressure. Check with your doctor for instructions on monitoring your blood pressure while taking this medicine. Your mouth may get dry. Chewing sugarless gum or sucking hard candy, and drinking plenty of water, may help. Contact your doctor if the problem does not go away or is severe. What side effects may I notice from receiving this medicine? Side effects that you should report to your doctor or health care professional as soon as possible:  allergic reactions like skin rash, itching or hives, swelling of the face, lips, or tongue  anxious  breathing problems  confusion  changes in  vision  chest pain  confusion  elevated mood, decreased need for sleep, racing thoughts, impulsive behavior  eye pain  fast, irregular heartbeat  feeling faint or lightheaded, falls  feeling agitated, angry, or irritable  hallucination, loss of contact with reality  high blood pressure  loss of balance or coordination  palpitations  redness, blistering, peeling or loosening of the skin, including inside the mouth  restlessness, pacing, inability to keep still  seizures  stiff muscles  suicidal thoughts or other mood changes  trouble passing urine or change in the amount of urine  trouble sleeping  unusual bleeding or bruising  unusually weak or tired  vomiting  yellowing of the eyes or skin Side effects that usually do not require medical attention (report to your doctor or health care professional if they continue or are bothersome):  change in sex drive or performance  change in appetite or weight  constipation  dizziness  dry mouth  headache  increased sweating  nausea  tired This list may not describe all possible side effects. Call your doctor for medical advice about side effects. You may report side effects to FDA at 1-800-FDA-1088. Where should I keep my medicine? Keep out of the reach of children. Store at room temperature between 15 and 30 degrees C (59 to 86 degrees F). Throw away any unused medicine after the expiration date. NOTE: This sheet is a summary. It may not cover all possible information. If you have questions about this medicine, talk to your doctor, pharmacist, or health care provider.  2020 Elsevier/Gold Standard (2018-11-06 13:47:50)

## 2020-04-28 ENCOUNTER — Ambulatory Visit: Payer: 59 | Admitting: Family Medicine

## 2020-04-28 ENCOUNTER — Ambulatory Visit: Payer: 59 | Admitting: Unknown Physician Specialty

## 2020-04-28 ENCOUNTER — Ambulatory Visit: Payer: 59 | Admitting: Nurse Practitioner

## 2020-05-05 ENCOUNTER — Other Ambulatory Visit (HOSPITAL_COMMUNITY): Payer: Self-pay | Admitting: Neurosurgery

## 2020-05-05 DIAGNOSIS — M5440 Lumbago with sciatica, unspecified side: Secondary | ICD-10-CM | POA: Diagnosis not present

## 2020-05-05 DIAGNOSIS — M5136 Other intervertebral disc degeneration, lumbar region: Secondary | ICD-10-CM | POA: Diagnosis not present

## 2020-05-05 MED FILL — MORPHINE SULF ER 30 MG TAB: 30 | 30 days supply | Qty: 30 | Fill #0

## 2020-05-05 MED FILL — oxyCODONE HCL 15 MG TABS: 15 | 30 days supply | Qty: 120 | Fill #0

## 2020-05-06 ENCOUNTER — Other Ambulatory Visit (HOSPITAL_COMMUNITY): Payer: Self-pay

## 2020-05-10 DIAGNOSIS — Z86018 Personal history of other benign neoplasm: Secondary | ICD-10-CM

## 2020-05-10 HISTORY — DX: Personal history of other benign neoplasm: Z86.018

## 2020-05-11 ENCOUNTER — Other Ambulatory Visit: Payer: Self-pay

## 2020-05-11 ENCOUNTER — Encounter: Payer: Self-pay | Admitting: Dermatology

## 2020-05-11 ENCOUNTER — Ambulatory Visit (INDEPENDENT_AMBULATORY_CARE_PROVIDER_SITE_OTHER): Payer: 59 | Admitting: Dermatology

## 2020-05-11 DIAGNOSIS — D1800 Hemangioma unspecified site: Secondary | ICD-10-CM

## 2020-05-11 DIAGNOSIS — D229 Melanocytic nevi, unspecified: Secondary | ICD-10-CM | POA: Diagnosis not present

## 2020-05-11 DIAGNOSIS — L821 Other seborrheic keratosis: Secondary | ICD-10-CM

## 2020-05-11 DIAGNOSIS — D492 Neoplasm of unspecified behavior of bone, soft tissue, and skin: Secondary | ICD-10-CM

## 2020-05-11 DIAGNOSIS — L814 Other melanin hyperpigmentation: Secondary | ICD-10-CM

## 2020-05-11 DIAGNOSIS — L820 Inflamed seborrheic keratosis: Secondary | ICD-10-CM

## 2020-05-11 DIAGNOSIS — Z1283 Encounter for screening for malignant neoplasm of skin: Secondary | ICD-10-CM | POA: Diagnosis not present

## 2020-05-11 DIAGNOSIS — D2371 Other benign neoplasm of skin of right lower limb, including hip: Secondary | ICD-10-CM | POA: Diagnosis not present

## 2020-05-11 DIAGNOSIS — L309 Dermatitis, unspecified: Secondary | ICD-10-CM | POA: Diagnosis not present

## 2020-05-11 DIAGNOSIS — D224 Melanocytic nevi of scalp and neck: Secondary | ICD-10-CM | POA: Diagnosis not present

## 2020-05-11 DIAGNOSIS — D485 Neoplasm of uncertain behavior of skin: Secondary | ICD-10-CM

## 2020-05-11 DIAGNOSIS — D2362 Other benign neoplasm of skin of left upper limb, including shoulder: Secondary | ICD-10-CM

## 2020-05-11 DIAGNOSIS — L578 Other skin changes due to chronic exposure to nonionizing radiation: Secondary | ICD-10-CM

## 2020-05-11 DIAGNOSIS — D489 Neoplasm of uncertain behavior, unspecified: Secondary | ICD-10-CM

## 2020-05-11 DIAGNOSIS — D239 Other benign neoplasm of skin, unspecified: Secondary | ICD-10-CM

## 2020-05-11 MED ORDER — HALOBETASOL PROPIONATE 0.05 % EX CREA: TOPICAL_CREAM | Freq: Two times a day (BID) | CUTANEOUS | 2 refills | Status: DC | PRN

## 2020-05-11 NOTE — Progress Notes (Signed)
New Patient Visit  Subjective  Jamie [name] is a 49 y.o. female who presents for the following: Annual Exam (Here for total skin check. Has some moles would like removed. Rubbed by clothing, irritated.) and Eczema (Hands. Several years. Has been using TAC cream and Eucrisa Rx'd by PCP.). No family h/o psoriasis.  Spot on L side irritated by bra, spot on upper chest also sore.  Spot on abdomen is irritated- has been frozen in past- didn't come off.    Objective  Well appearing patient in no apparent distress; mood and affect are within normal limits.  Review of Systems: No other skin or systemic complaints except as noted in HPI or Assessment and Plan.  A full examination was performed including scalp, head, eyes, ears, nose, lips, neck, chest, axillae, abdomen, back, buttocks, bilateral upper extremities, bilateral lower extremities, hands, feet, fingers, toes, fingernails, and toenails. All findings within normal limits unless otherwise noted below.  Objective  bilateral hands, bilateral feet: Well demarcated erythematous patch with mild scale and dried small pustules at bilateral palms. Nails clear. Mild hyperkeratosis and mild erythema at plantar feet.  Objective  Left Flank at bra line x1, left clavicle x1 (2): Erythematous keratotic or waxy stuck-on papule  Objective  Left Antecubital Fossa, right medial thigh: Firm pink/brown papulenodule with dimple sign.   Objective  Neck - Posterior: 61mm med dark brown macule      Objective  Right Abdomen (side) - Lower: 1.3cm waxy dark brown papule     Assessment & Plan    Lentigines - Scattered tan macules - Discussed due to sun exposure - Benign, observe - Call for any changes  Seborrheic Keratoses - Stuck-on, waxy, tan-brown papules and plaques abd, back - Discussed benign etiology and prognosis. - Observe - Call for any changes  Melanocytic Nevi - Tan-brown and/or pink-flesh-colored symmetric macules and  papules - Benign appearing on exam today - Observation - Call clinic for new or changing moles - Recommend daily use of broad spectrum spf 30+ sunscreen to sun-exposed areas.   Hemangiomas - Red papules - Discussed benign nature - Observe - Call for any changes  Actinic Damage - diffuse scaly erythematous macules with underlying dyspigmentation at chest, face - Recommend daily broad spectrum sunscreen SPF 30+ to sun-exposed areas, reapply every 2 hours as needed.  - Call for new or changing lesions.  Skin cancer screening performed today.  Dermatitis bilateral hands, bilateral feet  Vrs psoriasis  Discussed adding Otezla if not improving with topical corticosteroids and Eucrisa.   Continue Eucrisa qd/bid as directed. Start Halobetasol cream qd/bid prn flares  Recommend mild soap and routine use of moisturizing cream after handwashing.  Minimize soap/water exposure when possible.    Recommend using Eucerin Spot Treatment for dry skin palms/soles prn.   halobetasol (ULTRAVATE) 0.05 % cream - bilateral hands, bilateral feet  Inflamed seborrheic keratosis (2) Left Flank at bra line x1, left clavicle x1  Destruction of lesion - Left Flank at bra line x1, left clavicle x1  Destruction method: cryotherapy   Informed consent: discussed and consent obtained   Lesion destroyed using liquid nitrogen: Yes   Region frozen until ice ball extended beyond lesion: Yes   Outcome: patient tolerated procedure well with no complications   Post-procedure details: wound care instructions given    Dermatofibroma Left Antecubital Fossa, right medial thigh  Benign, observe.    Neoplasm of uncertain behavior Neck - Posterior  Epidermal / dermal shaving  Lesion diameter (cm):  0.3 Informed consent: discussed and consent obtained   Patient was prepped and draped in usual sterile fashion: Area prepped with alcohol. Anesthesia: the lesion was anesthetized in a standard fashion     Anesthetic:  1% lidocaine w/ epinephrine 1-100,000 buffered w/ 8.4% NaHCO3 Instrument used: flexible razor blade   Hemostasis achieved with: pressure, aluminum chloride and electrodesiccation   Outcome: patient tolerated procedure well   Post-procedure details: wound care instructions given   Post-procedure details comment:  Ointment and small bandage applied Additional details:  0.6 cm post treatment diameter with margin  Specimen 1 - Surgical pathology Differential Diagnosis: Nevus R/O dysplasia Check Margins: No 3mm med dark brown macule  Neoplasm of skin Right Abdomen (side) - Lower  Epidermal / dermal shaving  Lesion diameter (cm):  1.3 Informed consent: discussed and consent obtained   Patient was prepped and draped in usual sterile fashion: Area prepped with alcohol. Anesthesia: the lesion was anesthetized in a standard fashion   Anesthetic:  1% lidocaine w/ epinephrine 1-100,000 buffered w/ 8.4% NaHCO3 Instrument used: flexible razor blade   Hemostasis achieved with: pressure, aluminum chloride and electrodesiccation   Outcome: patient tolerated procedure well   Post-procedure details: wound care instructions given   Post-procedure details comment:  Ointment and small bandage applied Additional details:  1.3 post treatment defect  Specimen 2 - Surgical pathology Differential Diagnosis: ISK R/O atypia Check Margins: No 1.3cm waxy dark brown papule  Return in about 6 weeks (around 06/22/2020) for dermatitis recheck.   I, Emelia Salisbury, CMA, am acting as scribe for [name], MD.  Documentation: I have reviewed the above documentation for accuracy and completeness, and I agree with the above.  [name] MD

## 2020-05-11 NOTE — Patient Instructions (Addendum)
Gentle Skin Care Guide  1. Bathe no more than once a day.  2. Avoid bathing in hot water  3. Use a mild soap like Dove, Vanicream, Cetaphil, CeraVe. Can use Lever 2000 or Cetaphil antibacterial soap  4. Use soap only where you need it. On most days, use it under your arms, between your legs, and on your feet. Let the water rinse other areas unless visibly dirty.  5. When you get out of the bath/shower, use a towel to gently blot your skin dry, don't rub it.  6. While your skin is still a little damp, apply a moisturizing cream such as Vanicream, CeraVe, Cetaphil, Eucerin, Sarna lotion or plain Vaseline Jelly. For hands apply Neutrogena [place] Hand Cream or Excipial Hand Cream.  7. Reapply moisturizer any time you start to itch or feel dry.  8. Sometimes using free and clear laundry detergents can be helpful. Fabric softener sheets should be avoided. Downy Free & Gentle liquid, or any liquid fabric softener that is free of dyes and perfumes, it acceptable to use  9. If your doctor has given you prescription creams you may apply moisturizers over them    Recommend using Eucerin dry skin spot treatment for worse dry areas.  Wound Care Instructions  1. Cleanse wound gently with soap and water once a day then pat dry with clean gauze. Apply a thing coat of Petrolatum (petroleum jelly, "Vaseline") over the wound (unless you have an allergy to this). We recommend that you use a new, sterile tube of Vaseline. Do not pick or remove scabs. Do not remove the yellow or white "healing tissue" from the base of the wound.  2. Cover the wound with fresh, clean, nonstick gauze and secure with paper tape. You may use Band-Aids in place of gauze and tape if the would is small enough, but would recommend trimming much of the tape off as there is often too much. Sometimes Band-Aids can irritate the skin.  3. You should call the office for your biopsy report after 1 week if you have not already been  contacted.  4. If you experience any problems, such as abnormal amounts of bleeding, swelling, significant bruising, significant pain, or evidence of infection, please call the office immediately.  5. FOR ADULT SURGERY PATIENTS: If you need something for pain relief you may take 1 extra strength Tylenol (acetaminophen) AND 2 Ibuprofen (200mg  each) together every 4 hours as needed for pain. (do not take these if you are allergic to them or if you have a reason you should not take them.) Typically, you may only need pain medication for 1 to 3 days.    Cryotherapy Aftercare   Wash gently with soap and water everyday.    Apply Vaseline and Band-Aid daily until healed.  Prior to procedure, discussed risks of blister formation, small wound, skin dyspigmentation, or rare scar following cryotherapy.

## 2020-05-18 ENCOUNTER — Telehealth: Payer: Self-pay

## 2020-05-18 NOTE — Telephone Encounter (Signed)
-----   Message from [name], MD sent at 05/17/2020  7:28 PM EDT ----- 1. 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 Skin , right abdomen (side) - lower PIGMENTED SEBORRHEIC KERATOSIS  1. Severely atypical mole- needs excision 2. SK- benign age-related growth

## 2020-05-18 NOTE — Telephone Encounter (Signed)
Advised pt of bx results and scheduled pt for surgery for severe dysplastic nevus of the L post neck on 07/18/2020 at 1:30

## 2020-05-24 ENCOUNTER — Telehealth: Payer: Self-pay

## 2020-05-24 ENCOUNTER — Ambulatory Visit (INDEPENDENT_AMBULATORY_CARE_PROVIDER_SITE_OTHER): Payer: 59 | Admitting: Licensed Clinical Social Worker

## 2020-05-24 ENCOUNTER — Encounter: Payer: Self-pay | Admitting: Licensed Clinical Social Worker

## 2020-05-24 ENCOUNTER — Other Ambulatory Visit: Payer: Self-pay

## 2020-05-24 DIAGNOSIS — F331 Major depressive disorder, recurrent, moderate: Secondary | ICD-10-CM

## 2020-05-24 DIAGNOSIS — F411 Generalized anxiety disorder: Secondary | ICD-10-CM

## 2020-05-24 NOTE — Telephone Encounter (Signed)
pt called left message that the medications is not working for her and she stopped taking medications,  wants to speak with youl.

## 2020-05-24 NOTE — Telephone Encounter (Signed)
Patient reports increased grinding of her teeth since being on the Cymbalta. We will stop Cymbalta.  When she comes back for her next appointment will consider adding another medication.  In the meantime she will continue to follow-up with her therapist.  She had an appointment this morning.

## 2020-05-24 NOTE — Progress Notes (Signed)
Patient Location: Advertising account executive Location: Home Office   Virtual Visit via Video Note  I connected with [name] on 05/24/20 at  8:00 AM EDT by a video enabled telemedicine application and verified that I am speaking with the correct person using two identifiers.   I discussed the limitations of evaluation and management by telemedicine and the availability of in person appointments. The patient expressed understanding and agreed to proceed.  THERAPY PROGRESS NOTE  Session Time: 24 Minutes  Participation Level: Active  Behavioral Response: Well Groomed and TearfulAlertDepressed  Type of Therapy: Individual Therapy  Treatment Goals addressed: Coping  Interventions: CBT  Summary: Jamie [name] is a 49 y.o. female who presents with depression and anxiety sxs. Pt reported she continues to struggle with main stressors including loss of marriage, work, and caring for her parents. Pt reported having little time for herself and noticed self often saying "I don't know" a lot. Pt acknowledged "I just push the emotions down so far and just rather not deal with it". Pt reported she has gained insight into her thoughts and emotions since engaging in therapy and that it has been beneficial to have someone to talk to. Pt was in agreement with treatment plan update with more focus on challenging negative thoughts and using mindfulness skills. Pt reported she started new medication with Dr. Shea Evans, however stopped taking them after experiencing teeth gritting and headaches.  Suicidal/Homicidal: No  Therapist Response: Therapist met with patient for follow up session. Therapist and patient reviewed treatment plan and goals for 3 month update. Therapist provided psychoeducation around mindfulness skills and engaged patient in self-soothing with the 5 senses. Pt was receptive. Therapist encouraged patient to choose and practice some mindfulness skills between now and next session and will follow up  with psychiatrist regarding medication side effects.  Plan: Return again in 2 weeks.  Diagnosis: Axis I: Generalized Anxiety Disorder and MDD, Recurrent, Moderate    Axis II: N/A  [name], LCSW, LCAS 05/24/2020

## 2020-05-25 ENCOUNTER — Other Ambulatory Visit (HOSPITAL_COMMUNITY)
Admission: RE | Admit: 2020-05-25 | Discharge: 2020-05-25 | Disposition: A | Discharge status: Home / Self Care | Payer: 59 | Source: Ambulatory Visit | Attending: Gastroenterology | Admitting: Gastroenterology

## 2020-05-25 NOTE — Progress Notes (Signed)
Patient was COVID tested 04/04/20 scan is under lab section.  Result was positive.  Patients positive test is within 90 days of her procedure so she does not need to be retested before her procedure on 05/27/20

## 2020-05-26 MED FILL — SUPREP BOWEL PREP KIT: 17.5-3.13-1 | 1 days supply | Qty: 354 | Fill #0

## 2020-05-27 ENCOUNTER — Other Ambulatory Visit: Payer: Self-pay

## 2020-05-27 ENCOUNTER — Ambulatory Visit: Payer: 59 | Admitting: Certified Registered Nurse Anesthetist

## 2020-05-27 ENCOUNTER — Encounter
Admission: RE | Disposition: A | Discharge status: Home / Self Care | Payer: Self-pay | Source: Home / Self Care | Attending: Gastroenterology

## 2020-05-27 ENCOUNTER — Encounter: Payer: Self-pay | Admitting: Gastroenterology

## 2020-05-27 ENCOUNTER — Ambulatory Visit
Admission: RE | Admit: 2020-05-27 | Discharge: 2020-05-27 | Disposition: A | Discharge status: Home / Self Care | Payer: 59 | Attending: Gastroenterology | Admitting: Gastroenterology

## 2020-05-27 DIAGNOSIS — G4700 Insomnia, unspecified: Secondary | ICD-10-CM | POA: Diagnosis not present

## 2020-05-27 DIAGNOSIS — J302 Other seasonal allergic rhinitis: Secondary | ICD-10-CM | POA: Diagnosis not present

## 2020-05-27 DIAGNOSIS — K5730 Diverticulosis of large intestine without perforation or abscess without bleeding: Secondary | ICD-10-CM | POA: Diagnosis not present

## 2020-05-27 DIAGNOSIS — Z79899 Other long term (current) drug therapy: Secondary | ICD-10-CM | POA: Insufficient documentation

## 2020-05-27 DIAGNOSIS — Z791 Long term (current) use of non-steroidal anti-inflammatories (NSAID): Secondary | ICD-10-CM | POA: Diagnosis not present

## 2020-05-27 DIAGNOSIS — Z87891 Personal history of nicotine dependence: Secondary | ICD-10-CM | POA: Insufficient documentation

## 2020-05-27 DIAGNOSIS — F32A Depression, unspecified: Secondary | ICD-10-CM | POA: Insufficient documentation

## 2020-05-27 DIAGNOSIS — F410 Panic disorder [episodic paroxysmal anxiety] without agoraphobia: Secondary | ICD-10-CM | POA: Insufficient documentation

## 2020-05-27 DIAGNOSIS — E559 Vitamin D deficiency, unspecified: Secondary | ICD-10-CM | POA: Diagnosis not present

## 2020-05-27 DIAGNOSIS — M1990 Unspecified osteoarthritis, unspecified site: Secondary | ICD-10-CM | POA: Insufficient documentation

## 2020-05-27 DIAGNOSIS — Z1211 Encounter for screening for malignant neoplasm of colon: Secondary | ICD-10-CM | POA: Insufficient documentation

## 2020-05-27 DIAGNOSIS — K5790 Diverticulosis of intestine, part unspecified, without perforation or abscess without bleeding: Secondary | ICD-10-CM | POA: Diagnosis not present

## 2020-05-27 DIAGNOSIS — L309 Dermatitis, unspecified: Secondary | ICD-10-CM | POA: Diagnosis not present

## 2020-05-27 HISTORY — PX: COLONOSCOPY WITH PROPOFOL: SHX5780

## 2020-05-27 SURGERY — COLONOSCOPY WITH PROPOFOL
Anesthesia: General

## 2020-05-27 MED ORDER — MIDAZOLAM HCL 2 MG/2ML IJ SOLN
INTRAMUSCULAR | Status: AC
  Filled 2020-05-27: qty 2

## 2020-05-27 MED ORDER — PROPOFOL 10 MG/ML IV BOLUS
INTRAVENOUS | Status: DC | PRN
  Administered 2020-05-27: 60 mg via INTRAVENOUS

## 2020-05-27 MED ORDER — PROPOFOL 500 MG/50ML IV EMUL
INTRAVENOUS | Status: DC | PRN
  Administered 2020-05-27: 150 ug/kg/min via INTRAVENOUS

## 2020-05-27 MED ORDER — LIDOCAINE HCL (PF) 2 % IJ SOLN
INTRAMUSCULAR | Status: AC
  Filled 2020-05-27: qty 5

## 2020-05-27 MED ORDER — SODIUM CHLORIDE 0.9 % IV SOLN
INTRAVENOUS | Status: DC
  Administered 2020-05-27: 20 mL/h via INTRAVENOUS

## 2020-05-27 MED ORDER — MIDAZOLAM HCL 2 MG/2ML IJ SOLN
INTRAMUSCULAR | Status: DC | PRN
  Administered 2020-05-27: 2 mg via INTRAVENOUS

## 2020-05-27 MED ORDER — PROPOFOL 500 MG/50ML IV EMUL
INTRAVENOUS | Status: AC
  Filled 2020-05-27: qty 50

## 2020-05-27 MED ORDER — LIDOCAINE HCL (CARDIAC) PF 100 MG/5ML IV SOSY
PREFILLED_SYRINGE | INTRAVENOUS | Status: DC | PRN
  Administered 2020-05-27: 50 mg via INTRAVENOUS

## 2020-05-27 MED ORDER — EPHEDRINE SULFATE 50 MG/ML IJ SOLN
INTRAMUSCULAR | Status: DC | PRN
  Administered 2020-05-27 (×2): 5 mg via INTRAVENOUS
  Administered 2020-05-27 (×2): 10 mg via INTRAVENOUS

## 2020-05-27 NOTE — Anesthesia Preprocedure Evaluation (Signed)
Anesthesia Evaluation  Patient identified by MRN, date of birth, ID band Patient awake    Reviewed: Allergy & Precautions, H&P , NPO status , Patient's Chart, lab work & pertinent test results, reviewed documented beta blocker date and time   History of Anesthesia Complications (+) PONV, Family history of anesthesia reaction and history of anesthetic complications  Airway Mallampati: I  TM Distance: >3 FB Neck ROM: full    Dental  (+) Dental Advidsory Given, Teeth Intact   Pulmonary neg pulmonary ROS, former smoker,    Pulmonary exam normal breath sounds clear to auscultation       Cardiovascular Exercise Tolerance: Good negative cardio ROS Normal cardiovascular exam Rhythm:regular Rate:Normal     Neuro/Psych PSYCHIATRIC DISORDERS Anxiety Depression negative neurological ROS     GI/Hepatic negative GI ROS, Neg liver ROS,   Endo/Other  negative endocrine ROS  Renal/GU negative Renal ROS  negative genitourinary   Musculoskeletal   Abdominal   Peds  Hematology negative hematology ROS (+)   Anesthesia Other Findings Past Medical History: No date: Anxiety     Comment:  takes Ativan daily.  Panic Attack No date: Arthritis No date: Chronic back pain     Comment:  herniated disc/stenosis/scoliosis No date: Depression     Comment:  takes Effexor daily No date: Eczema     Comment:  uses a cream daily as needed No date: Family history of adverse reaction to anesthesia     Comment:  pta dad is very hard to wake up;excessive nausea No date: History of bronchitis     Comment:  > 25yr ago No date: Hypoglycemia No date: Insomnia     Comment:  takes Lorazepam nightly No date: Joint pain No date: PONV (postoperative nausea and vomiting) No date: Seasonal allergies     Comment:  uses Flonase daily No date: Vitamin D deficiency     Comment:  takes Vit D daily   Reproductive/Obstetrics negative OB ROS                              Anesthesia Physical Anesthesia Plan  ASA: II  Anesthesia Plan: General   Post-op Pain Management:    Induction: Intravenous  PONV Risk Score and Plan: 4 or greater and Propofol infusion and TIVA  Airway Management Planned: Natural Airway and Nasal Cannula  Additional Equipment:   Intra-op Plan:   Post-operative Plan:   Informed Consent: I have reviewed the patients History and Physical, chart, labs and discussed the procedure including the risks, benefits and alternatives for the proposed anesthesia with the patient or authorized representative who has indicated his/her understanding and acceptance.     Dental Advisory Given  Plan Discussed with: Anesthesiologist, CRNA and Surgeon  Anesthesia Plan Comments:         Anesthesia Quick Evaluation

## 2020-05-27 NOTE — Transfer of Care (Signed)
Immediate Anesthesia Transfer of Care Note  Patient: Jamie [name]  Procedure(s) Performed: COLONOSCOPY WITH PROPOFOL (N/A )  Patient Location: PACU and Endoscopy Unit  Anesthesia Type:General  Level of Consciousness: awake, alert  and oriented  Airway & Oxygen Therapy: Patient Spontanous Breathing  Post-op Assessment: Report given to RN and Post -op Vital signs reviewed and stable  Post vital signs: Reviewed and stable  Last Vitals:  Vitals Value Taken Time  BP    Temp    Pulse    Resp    SpO2      Last Pain:  Vitals:   05/27/20 0943  TempSrc: Temporal  PainSc: 4          Complications: No complications documented.

## 2020-05-27 NOTE — H&P (Signed)
Jamie Antigua, MD [address], [place], [place], [place], [address] [id number], [place], [place], [place], [address] Phone: [phone]  Fax: [phone]  Primary Care Physician:  Valerie Roys, DO   Pre-Procedure History & Physical: HPI:  Jamie [name] is a 49 y.o. female is here for a colonoscopy.   Past Medical History:  Diagnosis Date  . Anxiety    takes Ativan daily.  Panic Attack  . Arthritis   . Chronic back pain    herniated disc/stenosis/scoliosis  . Depression    takes Effexor daily  . Eczema    uses a cream daily as needed  . Family history of adverse reaction to anesthesia    pta dad is very hard to wake up;excessive nausea  . History of bronchitis    > 59yrago  . Hypoglycemia   . Insomnia    takes Lorazepam nightly  . Joint pain   . PONV (postoperative nausea and vomiting)   . Seasonal allergies    uses Flonase daily  . Vitamin D deficiency    takes Vit D daily    Past Surgical History:  Procedure Laterality Date  . BACK SURGERY  2008/2012   laminectomy 1st time and 2nd time laminectomy and fusion  . MAXIMUM ACCESS (MAS)POSTERIOR LUMBAR INTERBODY FUSION (PLIF) 2 LEVEL N/A 09/01/2014   Procedure: Lumbar four-five, Lumbar five-Sacral one Maximum Access Surgery posterior lumbar interbody fusion with interbody prosthesis posterior lateral arthrodesis posterior segmental instrumentation;  Surgeon: GElaina Hoops MD;  [facility];  Service: Neurosurgery;  Laterality: N/A;  Lumbar four-five, Lumbar five-Sacral one Maximum Access Surgery posterior lumbar interbody fusion with i    Prior to Admission medications   Medication Sig Start Date End Date Taking? Authorizing Provider  albuterol (VENTOLIN HFA) 108 (90 Base) MCG/ACT inhaler Inhale 2 puffs into the lungs every 6 (six) hours as needed for wheezing or shortness of breath. 04/08/20  Yes Johnson, Megan P, DO  Ascorbic Acid (VITAMIN C PO) Take by mouth.   Yes [provider]    benzonatate (TESSALON) 200 MG capsule Take 1 capsule (200 mg total) by mouth 2 (two) times daily as needed for cough. 04/08/20  Yes Johnson, Megan P, DO  busPIRone (BUSPAR) 15 MG tablet Take 1 tablet (15 mg total) by mouth 3 (three) times daily. 04/04/20  Yes LVolney American PA-C  cholecalciferol (VITAMIN D) 1000 UNITS tablet Take 1,000 Units by mouth daily.   Yes [provider]  Crisaborole (EUCRISA) 2 % OINT Apply 1 application topically 2 (two) times daily as needed. 11/04/19  Yes LVolney American PA-C  fluticasone ([facility] 50 MCG/ACT nasal spray Place 1 spray into both nostrils daily as needed for allergies.    Yes [provider]  gabapentin (NEURONTIN) 600 MG tablet Take 600 mg by mouth 3 (three) times daily.  10/03/18  Yes [provider]  halobetasol (ULTRAVATE) 0.05 % cream Apply topically 2 (two) times daily as needed. Up to 2 weeks. Avoid face, groin, underarms 05/11/20  Yes SBrendolyn Patty MD  meloxicam (MOBIC) 7.5 MG tablet Take 7.5 mg by mouth 2 (two) times daily. 03/16/20  Yes [provider]  metaxalone (SKELAXIN) 800 MG tablet Take 800 mg by mouth 3 (three) times daily. 09/06/18  Yes [provider]  montelukast (SINGULAIR) 10 MG tablet Take 1 tablet (10 mg total) by mouth at bedtime. 03/09/20  Yes LVolney American PA-C  morphine (KADIAN) 30 MG 24 hr capsule Take 30  mg by mouth daily.   Yes [provider]  Multiple Vitamin (MULTIVITAMIN ADULT PO) Take by mouth daily.   Yes [provider]  Multiple Vitamins-Minerals (ZINC PO) Take by mouth.   Yes [provider]  oxyCODONE (ROXICODONE) 15 MG immediate release tablet Take 15 mg by mouth every 6 (six) hours as needed.  03/09/16  Yes [provider]  propranolol (INDERAL) 10 MG tablet Take 1 tablet (10 mg total) by mouth 3 (three) times daily as needed. FOR SEVERE ANXIETY ATTACKS 04/26/20  Yes Ursula Alert, MD  QUEtiapine (SEROQUEL) 50 MG  tablet Take 1 tablet (50 mg total) by mouth at bedtime. 04/04/20  Yes Volney American, PA-C  SUPREP BOWEL PREP KIT 17.5-3.13-1.6 GM/177ML SOLN Take by mouth as directed.  04/13/20  Yes [provider]  triamcinolone cream (KENALOG) 0.1 % Apply 1 application topically 2 (two) times daily. 11/04/19  Yes Volney American, PA-C  clonazePAM (KLONOPIN) 0.5 MG tablet Take 1 tablet (0.5 mg total) by mouth 2 (two) times daily as needed for anxiety. Patient not taking: Reported on 05/11/2020 02/04/20   Volney American, PA-C  dextromethorphan 15 MG/5ML syrup Take 10 mLs (30 mg total) by mouth 4 (four) times daily as needed for cough. Patient not taking: Reported on 04/26/2020 04/14/20   Eulogio Bear, NP    Allergies as of 04/13/2020 - Review Complete 04/13/2020  Allergen Reaction Noted  . Shellfish allergy Hives 08/18/2014  . Flexeril [cyclobenzaprine] Hives 08/18/2014    Family History  Problem Relation Age of Onset  . Arthritis Mother   . Hyperlipidemia Mother   . Migraines Mother   . Arthritis Father   . Diabetes Father   . Heart disease Father   . Diabetes Brother   . Breast cancer Maternal Grandmother   . Mental illness Neg Hx     Social History   Socioeconomic History  . Marital status: Divorced    Spouse name: Not on file  . Number of children: Not on file  . Years of education: Not on file  . Highest education level: Bachelor's degree (e.g., BA, AB, BS)  Occupational History  . Not on file  Tobacco Use  . Smoking status: Former Smoker    Packs/day: 0.50    Years: 20.00    Pack years: 10.00    Quit date: 10/18/2016    Years since quitting: 3.6  . Smokeless tobacco: Never Used  Vaping Use  . Vaping Use: Never used  Substance and Sexual Activity  . Alcohol use: No  . Drug use: No  . Sexual activity: Not Currently  Other Topics Concern  . Not on file  Social History Narrative  . Not on file   Social Determinants of Health   Financial  Resource Strain:   . Difficulty of Paying Living Expenses: Not on file  Food Insecurity:   . Worried About Charity fundraiser in the Last Year: Not on file  . Ran Out of Food in the Last Year: Not on file  Transportation Needs:   . Lack of Transportation (Medical): Not on file  . Lack of Transportation (Non-Medical): Not on file  Physical Activity:   . Days of Exercise per Week: Not on file  . Minutes of Exercise per Session: Not on file  Stress:   . Feeling of Stress : Not on file  Social Connections:   . Frequency of Communication with Friends and Family: Not on file  . Frequency of  Social Gatherings with Friends and Family: Not on file  . Attends Religious Services: Not on file  . Active Member of Clubs or Organizations: Not on file  . Attends Archivist Meetings: Not on file  . Marital Status: Not on file  Intimate Partner Violence:   . Fear of Current or Ex-Partner: Not on file  . Emotionally Abused: Not on file  . Physically Abused: Not on file  . Sexually Abused: Not on file    Review of Systems: See HPI, otherwise negative ROS  Physical Exam: BP (!) 117/57   Pulse 96   Temp 97.9 F (36.6 C) (Temporal)   Resp 16   Ht _0  (1.702 m)   Wt 68 kg   LMP 06/20/2016 (Approximate)   SpO2 100%   BMI 23.49 kg/m  General:   Alert,  pleasant and cooperative in NAD Head:  Normocephalic and atraumatic. Neck:  Supple; no masses or thyromegaly. Lungs:  Clear throughout to auscultation, normal respiratory effort.    Heart:  +S1, +S2, Regular rate and rhythm, No edema. Abdomen:  Soft, nontender and nondistended. Normal bowel sounds, without guarding, and without rebound.   Neurologic:  Alert and  oriented x4;  grossly normal neurologically.  Impression/Plan: Jamie [name] is here for a colonoscopy to be performed for average risk screening.  Risks, benefits, limitations, and alternatives regarding  colonoscopy have been reviewed with the patient.  Questions have  been answered.  All parties agreeable.   Virgel Manifold, MD  05/27/2020, 11:13 AM

## 2020-05-27 NOTE — Op Note (Signed)
[facility] Gastroenterology Patient Name: Jamie [name] Procedure Date: 05/27/2020 11:03 AM MRN: [id number] Account #: [id number] Date of Birth: Jul 05, 1971 Admit Type: Outpatient Age: 49 Room: [facility] ENDO ROOM 4 Gender: Female Note Status: Finalized Procedure:             Colonoscopy Indications:           Screening for colorectal malignant neoplasm Providers:             Carmen Tolliver B. Bonna Gains MD, MD Referring MD:          Valerie Roys (Referring MD) Medicines:             Monitored Anesthesia Care Complications:         No immediate complications. Procedure:             Pre-Anesthesia Assessment:                        - Prior to the procedure, a History and Physical was                         performed, and patient medications, allergies and                         sensitivities were reviewed. The patient's tolerance                         of previous anesthesia was reviewed.                        - The risks and benefits of the procedure and the                         sedation options and risks were discussed with the                         patient. All questions were answered and informed                         consent was obtained.                        - Patient identification and proposed procedure were                         verified prior to the procedure by the physician, the                         nurse, the anesthetist and the technician. The                         procedure was verified in the pre-procedure area in                         the procedure room in the endoscopy suite.                        - ASA Grade Assessment: II - A patient with mild  systemic disease.                        - After reviewing the risks and benefits, the patient                         was deemed in satisfactory condition to undergo the                         procedure.                        After obtaining informed consent, the  colonoscope was                         passed under direct vision. Throughout the procedure,                         the patient's blood pressure, pulse, and oxygen                         saturations were monitored continuously. The                         Colonoscope was introduced through the anus and                         advanced to the the cecum, identified by appendiceal                         orifice and ileocecal valve. The colonoscopy was                         performed with ease. The patient tolerated the                         procedure well. The quality of the bowel preparation                         was good. Findings:      The perianal and digital rectal examinations were normal.      Multiple diverticula were found in the sigmoid colon.      The exam was otherwise without abnormality.      The rectum, sigmoid colon, descending colon, transverse colon, ascending       colon and cecum appeared normal.      The retroflexed view of the distal rectum and anal verge was normal and       showed no anal or rectal abnormalities. Impression:            - Diverticulosis in the sigmoid colon.                        - The examination was otherwise normal.                        - The rectum, sigmoid colon, descending colon,                         transverse colon, ascending colon and cecum are normal.                        -  The distal rectum and anal verge are normal on                         retroflexion view.                        - No specimens collected. Recommendation:        - Discharge patient to home.                        - Resume previous diet.                        - Continue present medications.                        - Repeat colonoscopy in 10 years for screening                         purposes.                        - Return to primary care physician as previously                         scheduled.                        - The findings and  recommendations were discussed with                         the patient.                        - The findings and recommendations were discussed with                         the patient's family.                        - High fiber diet.                        - In the future, if patient develops new symptoms such                         as blood per rectum, abdominal pain, weight loss,                         altered bowel habits or any other reason for concern,                         patient should discuss this with thier PCP as they may                         need a GI referral at that time or evaluation for need                         for colonoscopy earlier than the recommended screening  colonoscopy.                        In addition, if patient's family history of colon                         cancer changes (no family history at this time) in the                         future, earlier screening may be indicated and patient                         should discuss this with PCP as well. Procedure Code(s):     --- Professional ---                        [phone], Colonoscopy, flexible; diagnostic, including                         collection of specimen(s) by brushing or washing, when                         performed (separate procedure) Diagnosis Code(s):     --- Professional ---                        Z12.11, Encounter for screening for malignant neoplasm                         of colon CPT copyright 2019 [employer]. All rights reserved. The codes documented in this report are preliminary and upon coder review may  be revised to meet current compliance requirements.  [name], MD Margretta Sidle B. Bonna Gains MD, MD 05/27/2020 11:54:25 AM This report has been signed electronically. Number of Addenda: 0 Note Initiated On: 05/27/2020 11:03 AM Scope Withdrawal Time: 0 hours 17 minutes 50 seconds  Total Procedure Duration: 0 hours 27  minutes 35 seconds       [facility]

## 2020-05-28 NOTE — Anesthesia Postprocedure Evaluation (Signed)
Anesthesia Post Note  Patient: Jamie [name]  Procedure(s) Performed: COLONOSCOPY WITH PROPOFOL (N/A )  Patient location during evaluation: Endoscopy Anesthesia Type: General Level of consciousness: awake and alert Pain management: pain level controlled Vital Signs Assessment: post-procedure vital signs reviewed and stable Respiratory status: spontaneous breathing, nonlabored ventilation, respiratory function stable and patient connected to nasal cannula oxygen Cardiovascular status: blood pressure returned to baseline and stable Postop Assessment: no apparent nausea or vomiting Anesthetic complications: no   No complications documented.   Last Vitals:  Vitals:   05/27/20 1204 05/27/20 1214  BP: 104/62 113/72  Pulse: (!) 108 93  Resp: 18 20  Temp:    SpO2: 100% 100%    Last Pain:  Vitals:   05/27/20 1214  TempSrc:   PainSc: 0-No pain                 Martha Clan

## 2020-05-30 ENCOUNTER — Encounter: Payer: Self-pay | Admitting: Gastroenterology

## 2020-05-30 MED FILL — METAXALONE 800 MG TABS: 800 | 30 days supply | Qty: 120 | Fill #0

## 2020-05-30 MED FILL — MONTELUKAST SOD 10 MG TAB: 10 | 90 days supply | Qty: 90 | Fill #1

## 2020-05-30 MED FILL — GABAPENTIN 600 MG TABLET: 600 | 30 days supply | Qty: 90 | Fill #0

## 2020-05-30 MED FILL — BUSPIRONE HCL 15 MG TABS: 15 | 30 days supply | Qty: 90 | Fill #1

## 2020-05-30 MED FILL — MELOXICAM 7.5 MG TABLET: 7.5 | 30 days supply | Qty: 60 | Fill #0

## 2020-06-03 ENCOUNTER — Other Ambulatory Visit: Payer: Self-pay

## 2020-06-03 ENCOUNTER — Encounter: Payer: Self-pay | Admitting: Psychiatry

## 2020-06-03 ENCOUNTER — Telehealth (INDEPENDENT_AMBULATORY_CARE_PROVIDER_SITE_OTHER): Payer: 59 | Admitting: Psychiatry

## 2020-06-03 DIAGNOSIS — Z79899 Other long term (current) drug therapy: Secondary | ICD-10-CM | POA: Diagnosis not present

## 2020-06-03 DIAGNOSIS — F411 Generalized anxiety disorder: Secondary | ICD-10-CM

## 2020-06-03 DIAGNOSIS — F331 Major depressive disorder, recurrent, moderate: Secondary | ICD-10-CM | POA: Diagnosis not present

## 2020-06-03 DIAGNOSIS — F410 Panic disorder [episodic paroxysmal anxiety] without agoraphobia: Secondary | ICD-10-CM

## 2020-06-03 MED ORDER — BUSPIRONE HCL 15 MG PO TABS: 15.0000 mg | ORAL_TABLET | Freq: Three times a day (TID) | ORAL | 1 refills | Status: DC

## 2020-06-03 MED ORDER — PAROXETINE HCL 10 MG PO TABS: 10.0000 mg | ORAL_TABLET | Freq: Every day | ORAL | 1 refills | Status: DC

## 2020-06-03 MED ORDER — PROPRANOLOL HCL 20 MG PO TABS: 20.0000 mg | ORAL_TABLET | Freq: Two times a day (BID) | ORAL | 1 refills | Status: DC | PRN

## 2020-06-03 MED FILL — MORPHINE SULF ER 30 MG TAB: 30 | 30 days supply | Qty: 30 | Fill #0

## 2020-06-03 MED FILL — oxyCODONE HCL 15 MG TABS: 15 | 30 days supply | Qty: 120 | Fill #0

## 2020-06-03 NOTE — Progress Notes (Signed)
Provider Location : [facility] Patient Location : Car  Participants: Patient , Provider  Virtual Visit via Video Note  I connected with Jamie [name] on 06/03/20 at 10:00 AM EDT by a video enabled telemedicine application and verified that I am speaking with the correct person using two identifiers.   I discussed the limitations of evaluation and management by telemedicine and the availability of in person appointments. The patient expressed understanding and agreed to proceed.     I discussed the assessment and treatment plan with the patient. The patient was provided an opportunity to ask questions and all were answered. The patient agreed with the plan and demonstrated an understanding of the instructions.   The patient was advised to call back or seek an in-person evaluation if the symptoms worsen or if the condition fails to improve as anticipated.   [place] MD OP Progress Note  06/03/2020 12:05 PM Sheldon Amara  MRN:  [id number]  Chief Complaint:  Chief Complaint    Follow-up     HPI: Jamie [name] is a 49 year old Caucasian female, employed, divorced, lives in [place] with her parents, has a history of MDD, GAD, panic attacks, chronic pain, was evaluated by telemedicine today.  Patient today appeared to be tearful in session.  She reports she continues to struggle with psychosocial stressors of work-related problems.  She is not happy at her job and continues to have interpersonal struggles with her supervisor. Patient reports she is trying to transition to a new place at this time.  She however has not found anything yet.  She reports she stopped the Cymbalta.  She tried propranolol as needed however this dosage has not helped much.  She reports she had to try to cope with her anxiety attacks herself which does not work all the time.  She is currently with Ms. Zadie Rhine for therapy sessions.  She however reports due to her work stressors she however will not be able to see her  therapist often.  Patient denies any suicidality, homicidality or perceptual disturbances.  Patient reports sleep is okay.  She has to wake up at times to help her elderly parents.  Patient however does have Seroquel which she takes only as needed for sleep.  She does not want to take it every night since she has to be up to help her parents.  Patient denies any other concerns today.  Visit Diagnosis:    ICD-10-CM   1. MDD (major depressive disorder), recurrent episode, moderate (HCC)  F33.1 PARoxetine (PAXIL) 10 MG tablet    propranolol (INDERAL) 20 MG tablet    busPIRone (BUSPAR) 15 MG tablet  2. GAD (generalized anxiety disorder)  F41.1 PARoxetine (PAXIL) 10 MG tablet    propranolol (INDERAL) 20 MG tablet    busPIRone (BUSPAR) 15 MG tablet  3. Panic attack  F41.0 PARoxetine (PAXIL) 10 MG tablet    propranolol (INDERAL) 20 MG tablet    busPIRone (BUSPAR) 15 MG tablet  4. High risk medication use  Z79.899     Past Psychiatric History: I have reviewed past psychiatric history from my progress note on 04/26/2020.  Past trials of Trintellix-did not try due to expensive, Seroquel, Abilify, Effexor, sertraline, Cymbalta-bruxism, Belsomra, trazodone, Ambien, Klonopin.  Past Medical History:  Past Medical History:  Diagnosis Date  . Anxiety    takes Ativan daily.  Panic Attack  . Arthritis   . Chronic back pain    herniated disc/stenosis/scoliosis  . Depression    takes Effexor daily  .  Eczema    uses a cream daily as needed  . Family history of adverse reaction to anesthesia    pta dad is very hard to wake up;excessive nausea  . History of bronchitis    > 63yr[age]  . Hypoglycemia   . Insomnia    takes Lorazepam nightly  . Joint pain   . PONV (postoperative nausea and vomiting)   . Seasonal allergies    uses Flonase daily  . Vitamin D deficiency    takes Vit D daily    Past Surgical History:  Procedure Laterality Date  . BACK SURGERY  2008/2012   laminectomy 1st time and  2nd time laminectomy and fusion  . COLONOSCOPY WITH PROPOFOL N/A 05/27/2020   Procedure: COLONOSCOPY WITH PROPOFOL;  Surgeon: TVirgel Manifold MD;  Location: [facility];  Service: Gastroenterology;  Laterality: N/A;  COVID POSITIVE AUGUST 16  . MAXIMUM ACCESS (MAS)POSTERIOR LUMBAR INTERBODY FUSION (PLIF) 2 LEVEL N/A 09/01/2014   Procedure: Lumbar four-five, Lumbar five-Sacral one Maximum Access Surgery posterior lumbar interbody fusion with interbody prosthesis posterior lateral arthrodesis posterior segmental instrumentation;  Surgeon: GElaina Hoops MD;  Location: [facility];  Service: Neurosurgery;  Laterality: N/A;  Lumbar four-five, Lumbar five-Sacral one Maximum Access Surgery posterior lumbar interbody fusion with i    Family Psychiatric History: I have reviewed family psychiatric history from my progress note on 04/26/2020  Family History:  Family History  Problem Relation Age of Onset  . Arthritis Mother   . Hyperlipidemia Mother   . Migraines Mother   . Arthritis Father   . Diabetes Father   . Heart disease Father   . Diabetes Brother   . Breast cancer Maternal Grandmother   . Mental illness Neg Hx     Social History: I have reviewed social history from my progress note on 04/26/2020 Social History   Socioeconomic History  . Marital status: Divorced    Spouse name: Not on file  . Number of children: Not on file  . Years of education: Not on file  . Highest education level: Bachelor's degree (e.g., BA, AB, BS)  Occupational History  . Not on file  Tobacco Use  . Smoking status: Former Smoker    Packs/day: 0.50    Years: 20.00    Pack years: 10.00    Quit date: 10/18/2016    Years since quitting: 3.6  . Smokeless tobacco: Never Used  Vaping Use  . Vaping Use: Never used  Substance and Sexual Activity  . Alcohol use: No  . Drug use: No  . Sexual activity: Not Currently  Other Topics Concern  . Not on file  Social History Narrative  . Not on file   Social  Determinants of Health   Financial Resource Strain:   . Difficulty of Paying Living Expenses: Not on file  Food Insecurity:   . Worried About RCharity fundraiserin the Last Year: Not on file  . Ran Out of Food in the Last Year: Not on file  Transportation Needs:   . Lack of Transportation (Medical): Not on file  . Lack of Transportation (Non-Medical): Not on file  Physical Activity:   . Days of Exercise per Week: Not on file  . Minutes of Exercise per Session: Not on file  Stress:   . Feeling of Stress : Not on file  Social Connections:   . Frequency of Communication with Friends and Family: Not on file  . Frequency of Social Gatherings with Friends  and Family: Not on file  . Attends Religious Services: Not on file  . Active Member of Clubs or Organizations: Not on file  . Attends Archivist Meetings: Not on file  . Marital Status: Not on file    Allergies:  Allergies  Allergen Reactions  . Shellfish Allergy Hives  . Flexeril [Cyclobenzaprine] Hives    RAPID HEARTBEAT    Metabolic Disorder Labs: No results found for: HGBA1C, MPG No results found for: PROLACTIN Lab Results  Component Value Date   CHOL 193 04/04/2020   TRIG 54 04/04/2020   HDL 59 04/04/2020   CHOLHDL 3.6 10/16/2016   LDLCALC 124 (H) 04/04/2020   LDLCALC 107 (H) 02/06/2018   Lab Results  Component Value Date   TSH 1.560 04/04/2020   TSH 1.290 02/06/2018    Therapeutic Level Labs: No results found for: LITHIUM No results found for: VALPROATE No components found for:  CBMZ  Current Medications: Current Outpatient Medications  Medication Sig Dispense Refill  . albuterol (VENTOLIN HFA) 108 (90 Base) MCG/ACT inhaler Inhale 2 puffs into the lungs every 6 (six) hours as needed for wheezing or shortness of breath. 8 g 0  . Ascorbic Acid (VITAMIN C PO) Take by mouth.    . benzonatate (TESSALON) 200 MG capsule Take 1 capsule (200 mg total) by mouth 2 (two) times daily as needed for cough. 20  capsule 0  . busPIRone (BUSPAR) 15 MG tablet Take 1 tablet (15 mg total) by mouth 3 (three) times daily. 90 tablet 1  . cholecalciferol (VITAMIN D) 1000 UNITS tablet Take 1,000 Units by mouth daily.    Stasia Cavalier (EUCRISA) 2 % OINT Apply 1 application topically 2 (two) times daily as needed. 60 g 6  . dextromethorphan 15 MG/5ML syrup Take 10 mLs (30 mg total) by mouth 4 (four) times daily as needed for cough. (Patient not taking: Reported on 04/26/2020) 120 mL 0  . fluticasone (FLONASE) 50 MCG/ACT nasal spray Place 1 spray into both nostrils daily as needed for allergies.     Marland Kitchen gabapentin (NEURONTIN) 600 MG tablet Take 600 mg by mouth 3 (three) times daily.     . halobetasol (ULTRAVATE) 0.05 % cream Apply topically 2 (two) times daily as needed. Up to 2 weeks. Avoid face, groin, underarms 50 g 2  . meloxicam (MOBIC) 7.5 MG tablet Take 7.5 mg by mouth 2 (two) times daily.    . metaxalone (SKELAXIN) 800 MG tablet Take 800 mg by mouth 3 (three) times daily.    . montelukast (SINGULAIR) 10 MG tablet Take 1 tablet (10 mg total) by mouth at bedtime. 90 tablet 3  . morphine (KADIAN) 30 MG 24 hr capsule Take 30 mg by mouth daily.    Marland Kitchen morphine (MS CONTIN) 30 MG 12 hr tablet Take 30 mg by mouth every morning.    . Multiple Vitamin (MULTIVITAMIN ADULT PO) Take by mouth daily.    . Multiple Vitamins-Minerals (ZINC PO) Take by mouth.    . oxyCODONE (ROXICODONE) 15 MG immediate release tablet Take 15 mg by mouth every 6 (six) hours as needed.   0  . PARoxetine (PAXIL) 10 MG tablet Take 1 tablet (10 mg total) by mouth daily. 30 tablet 1  . propranolol (INDERAL) 20 MG tablet Take 1 tablet (20 mg total) by mouth 2 (two) times daily as needed. For severe panic attacks 60 tablet 1  . QUEtiapine (SEROQUEL) 50 MG tablet Take 1 tablet (50 mg total) by mouth at bedtime.  90 tablet 1  . SUPREP BOWEL PREP KIT 17.5-3.13-1.6 GM/177ML SOLN Take by mouth as directed.     . triamcinolone cream (KENALOG) 0.1 % Apply 1  application topically 2 (two) times daily. 60 g 6   No current facility-administered medications for this visit.     Musculoskeletal: Strength & Muscle Tone: UTA Gait & Station: Observed as seated Patient leans: N/A  Psychiatric Specialty Exam: Review of Systems  Musculoskeletal: Positive for arthralgias.  Psychiatric/Behavioral: Positive for dysphoric mood and sleep disturbance. The patient is nervous/anxious.   All other systems reviewed and are negative.   Last menstrual period 06/20/2016.There is no height or weight on file to calculate BMI.  General Appearance: Casual  Eye Contact:  Fair  Speech:  Clear and Coherent  Volume:  Normal  Mood:  Anxious and Depressed  Affect:  Tearful  Thought Process:  Goal Directed and Descriptions of Associations: Intact  Orientation:  Full (Time, Place, and Person)  Thought Content: Logical   Suicidal Thoughts:  No  Homicidal Thoughts:  No  Memory:  Immediate;   Fair Recent;   Fair Remote;   Fair  Judgement:  Fair  Insight:  Fair  Psychomotor Activity:  Normal  Concentration:  Concentration: Fair and Attention Span: Fair  Recall:  [employer] of Knowledge: Fair  Language: Fair  Akathisia:  No  Handed:  Right  AIMS (if indicated): UTA  Assets:  Communication Skills Desire for Improvement Housing Social Support  ADL's:  Intact  Cognition: WNL  Sleep:  Restless   Screenings: GAD-7     Video Visit from 04/26/2020 in [facility] Office Visit from 04/04/2020 in [facility] Video Visit from 01/26/2020 in [facility] Video Visit from 11/04/2019 in [facility] Visit from 07/24/2019 in [facility]  Total GAD-7 Score 19 18 20 20 21     PHQ2-9     Video Visit from 04/26/2020 in [facility] Office Visit from 04/04/2020 in [facility] Video Visit from 01/26/2020 in [facility] Video Visit from 11/04/2019 in  [facility] Visit from 09/14/2019 in [facility]  PHQ-2 Total Score 5 6 5 5 2   PHQ-9 Total Score 18 21 20 21 19        Assessment and Plan: Jamie [name] is a 49 year old Caucasian female, divorced, employed, lives in [place], has a history of MDD, GAD, panic attacks, seborrheic keratitis, chronic pain was evaluated by telemedicine today.  Patient with psychosocial stressors of interpersonal problems at work, COVID-19 pandemic, her own health issues, parents health problems.  Patient is motivated to stay in treatment as well as psychotherapy.  Plan as noted below.  Plan MDD-unstable Cymbalta discontinued due to side effects. Start Paxil 10 mg p.o. daily BuSpar 15 mg p.o. 3 times daily Seroquel 50 mg p.o. nightly as needed for sleep. Continue CBT with Ms. Zadie Rhine. Discussed with patient that we could do FMLA intermittent or 3 to 4 weeks.  Patient will let writer know.  GAD-unstable BuSpar 15 mg p.o. 3 times daily Start Paxil 10 mg p.o. daily Increase propranolol to 20 mg p.o. twice daily as needed for anxiety attacks. Discontinue Klonopin, patient stopped taking it.  Panic attacks-stable Continue CBT Start Paxil 10 mg daily. Increase propranolol as noted above.  High risk medication use-pending labs-hemoglobin A1c.  At risk for QT syndrome-pending EKG.  Follow-up in clinic in 3 to 4 weeks or sooner if needed.  I have spent atleast 20 minutes face to face by video with patient today. More than 50 % of the time was spent for preparing to see the patient ( e.g., review of test, records ),  ordering medications and test ,psychoeducation and supportive psychotherapy and care coordination,as well as documenting clinical information in electronic health record. This note was generated in part or whole with voice recognition software. Voice recognition is usually quite accurate but there are transcription errors that can and very  often do occur. I apologize for any typographical errors that were not detected and corrected.       Ursula Alert, MD 06/03/2020, 12:05 PM

## 2020-06-03 NOTE — Patient Instructions (Signed)
Paroxetine tablets What is this medicine? PAROXETINE (pa ROX e teen) is used to treat depression. It may also be used to treat anxiety disorders, obsessive compulsive disorder, panic attacks, post traumatic stress, and premenstrual dysphoric disorder (PMDD). This medicine may be used for other purposes; ask your health care provider or pharmacist if you have questions. COMMON BRAND NAME(S): Paxil, Pexeva What should I tell my health care provider before I take this medicine? They need to know if you have any of these conditions:  bipolar disorder or a family history of bipolar disorder  bleeding disorders  glaucoma  heart disease  kidney disease  liver disease  low levels of sodium in the blood  seizures  suicidal thoughts, plans, or attempt; a previous suicide attempt by you or a family member  take MAOIs like Carbex, Eldepryl, Marplan, Nardil, and Parnate  take medicines that treat or prevent blood clots  thyroid disease  an unusual or allergic reaction to paroxetine, other medicines, foods, dyes, or preservatives  pregnant or trying to get pregnant  breast-feeding How should I use this medicine? Take this medicine by mouth with a glass of water. Follow the directions on the prescription label. You can take it with or without food. Take your medicine at regular intervals. Do not take your medicine more often than directed. Do not stop taking this medicine suddenly except upon the advice of your doctor. Stopping this medicine too quickly may cause serious side effects or your condition may worsen. A special MedGuide will be given to you by the pharmacist with each prescription and refill. Be sure to read this information carefully each time. Talk to your pediatrician regarding the use of this medicine in children. Special care may be needed. Overdosage: If you think you have taken too much of this medicine contact a poison control center or emergency room at once. NOTE:  This medicine is only for you. Do not share this medicine with others. What if I miss a dose? If you miss a dose, take it as soon as you can. If it is almost time for your next dose, take only that dose. Do not take double or extra doses. What may interact with this medicine? Do not take this medicine with any of the following medications:  linezolid  MAOIs like Carbex, Eldepryl, Marplan, Nardil, and Parnate  methylene blue (injected into a vein)  pimozide  thioridazine This medicine may also interact with the following medications:  alcohol  amphetamines  aspirin and aspirin-like medicines  atomoxetine  certain medicines for depression, anxiety, or psychotic disturbances  certain medicines for irregular heart beat like propafenone, flecainide, encainide, and quinidine  certain medicines for migraine headache like almotriptan, eletriptan, frovatriptan, naratriptan, rizatriptan, sumatriptan, zolmitriptan  cimetidine  digoxin  diuretics  fentanyl  fosamprenavir  furazolidone  isoniazid  lithium  medicines that treat or prevent blood clots like warfarin, enoxaparin, and dalteparin  medicines for sleep  NSAIDs, medicines for pain and inflammation, like ibuprofen or naproxen  phenobarbital  phenytoin  procarbazine  rasagiline  ritonavir  supplements like St. John's wort, kava kava, valerian  tamoxifen  tramadol  tryptophan This list may not describe all possible interactions. Give your health care provider a list of all the medicines, herbs, non-prescription drugs, or dietary supplements you use. Also tell them if you smoke, drink alcohol, or use illegal drugs. Some items may interact with your medicine. What should I watch for while using this medicine? Tell your doctor if your symptoms do not   get better or if they get worse. Visit your doctor or health care professional for regular checks on your progress. Because it may take several weeks to see  the full effects of this medicine, it is important to continue your treatment as prescribed by your doctor. Patients and their families should watch out for new or worsening thoughts of suicide or depression. Also watch out for sudden changes in feelings such as feeling anxious, agitated, panicky, irritable, hostile, aggressive, impulsive, severely restless, overly excited and hyperactive, or not being able to sleep. If this happens, especially at the beginning of treatment or after a change in dose, call your health care professional. You may get drowsy or dizzy. Do not drive, use machinery, or do anything that needs mental alertness until you know how this medicine affects you. Do not stand or sit up quickly, especially if you are an older patient. This reduces the risk of dizzy or fainting spells. Alcohol may interfere with the effect of this medicine. Avoid alcoholic drinks. Your mouth may get dry. Chewing sugarless gum or sucking hard candy, and drinking plenty of water will help. Contact your doctor if the problem does not go away or is severe. What side effects may I notice from receiving this medicine? Side effects that you should report to your doctor or health care professional as soon as possible:  allergic reactions like skin rash, itching or hives, swelling of the face, lips, or tongue  anxious  black, tarry stools  changes in vision  confusion  elevated mood, decreased need for sleep, racing thoughts, impulsive behavior  eye pain  fast, irregular heartbeat  feeling faint or lightheaded, falls  feeling agitated, angry, or irritable  hallucination, loss of contact with reality  loss of balance or coordination  loss of memory  painful or prolonged erections  restlessness, pacing, inability to keep still  seizures  stiff muscles  suicidal thoughts or other mood changes  trouble sleeping  unusual bleeding or bruising  unusually weak or tired  vomiting Side  effects that usually do not require medical attention (report to your doctor or health care professional if they continue or are bothersome):  change in appetite or weight  change in sex drive or performance  diarrhea  dizziness  dry mouth  increased sweating  indigestion, nausea  tired  tremors This list may not describe all possible side effects. Call your doctor for medical advice about side effects. You may report side effects to FDA at 1-800-FDA-1088. Where should I keep my medicine? Keep out of the reach of children. Store at room temperature between 15 and 30 degrees C (59 and 86 degrees F). Keep container tightly closed. Throw away any unused medicine after the expiration date. NOTE: This sheet is a summary. It may not cover all possible information. If you have questions about this medicine, talk to your doctor, pharmacist, or health care provider.  2020 Elsevier/Gold Standard (2016-01-07 15:50:32)  

## 2020-06-07 ENCOUNTER — Ambulatory Visit: Payer: 59 | Admitting: Licensed Clinical Social Worker

## 2020-06-08 ENCOUNTER — Other Ambulatory Visit: Payer: Self-pay | Admitting: Psychiatry

## 2020-06-08 ENCOUNTER — Telehealth: Payer: Self-pay | Admitting: Psychiatry

## 2020-06-08 DIAGNOSIS — F331 Major depressive disorder, recurrent, moderate: Secondary | ICD-10-CM

## 2020-06-08 DIAGNOSIS — F411 Generalized anxiety disorder: Secondary | ICD-10-CM

## 2020-06-08 DIAGNOSIS — F410 Panic disorder [episodic paroxysmal anxiety] without agoraphobia: Secondary | ICD-10-CM

## 2020-06-08 MED ORDER — BUSPIRONE HCL 15 MG PO TABS: 15.0000 mg | ORAL_TABLET | Freq: Three times a day (TID) | ORAL | 1 refills | Status: DC

## 2020-06-08 MED ORDER — PROPRANOLOL HCL 20 MG PO TABS: 20.0000 mg | ORAL_TABLET | Freq: Two times a day (BID) | ORAL | 1 refills | Status: DC | PRN

## 2020-06-08 MED ORDER — PAROXETINE HCL 10 MG PO TABS: 10.0000 mg | ORAL_TABLET | Freq: Every day | ORAL | 1 refills | Status: DC

## 2020-06-08 MED FILL — PARoxetine HCL 10 MG TABS: 10 | 30 days supply | Qty: 30 | Fill #0

## 2020-06-08 MED FILL — PROPRANOLOL 20 MG TABLET: 20 | 30 days supply | Qty: 60 | Fill #0

## 2020-06-08 NOTE — Telephone Encounter (Signed)
I have sent Paxil, propranolol, BuSpar to pharmacy again due to E prescribing error previously.

## 2020-06-10 ENCOUNTER — Other Ambulatory Visit: Payer: Self-pay

## 2020-06-10 ENCOUNTER — Ambulatory Visit (INDEPENDENT_AMBULATORY_CARE_PROVIDER_SITE_OTHER): Payer: 59 | Admitting: Licensed Clinical Social Worker

## 2020-06-10 ENCOUNTER — Encounter: Payer: Self-pay | Admitting: Licensed Clinical Social Worker

## 2020-06-10 DIAGNOSIS — F411 Generalized anxiety disorder: Secondary | ICD-10-CM

## 2020-06-10 DIAGNOSIS — F331 Major depressive disorder, recurrent, moderate: Secondary | ICD-10-CM

## 2020-06-10 NOTE — Progress Notes (Signed)
Virtual Visit via Video Note  I connected with Jamie [name] on 06/10/20 at  8:00 AM EDT by a video enabled telemedicine application and verified that I am speaking with the correct person using two identifiers.  Location: Patient: Vehicle in [employer] Works[facility] Provider: Home Office   I discussed the limitations of evaluation and management by telemedicine and the availability of in person appointments. The patient expressed understanding and agreed to proceed.  THERAPY PROGRESS NOTE  Session Time: 30 Minutes  Participation Level: Active  Behavioral Response: Well GroomedAlertAnxious and Depressed  Type of Therapy: Individual Therapy  Treatment Goals addressed: Anxiety and Coping  Interventions: CBT  Summary: Jamie [name] is a 49 y.o. female who presents with depression and anxiety sxs. Pt reported she followed up with psychiatrist regarding medication side effects and is going to start new medication. Pt reported having panic attacks before going to work and experiencing work related stress. Pt reported she has difficulty "not taking things personally" when dealing with angry clients. Pt reported she did not see an email from therapist with the handouts for mindfulness skills, but is interested in reviewing and practicing for next time.    Suicidal/Homicidal: No  Therapist Response: Therapist met with patient for follow up session. Therapist and patient reviewed homework assignment. Therapist and patient explored reactions to stressors and ways to alter thoughts to increase compassion for self and others. Pt was receptive.  Plan: Return again in 2 weeks.  Diagnosis: Axis I: Generalized Anxiety Disorder and MDD, Recurrent, Moderate    Axis II: N/A  [name], LCSW, LCAS 06/10/2020

## 2020-06-24 ENCOUNTER — Ambulatory Visit: Payer: 59 | Admitting: Licensed Clinical Social Worker

## 2020-06-27 ENCOUNTER — Ambulatory Visit: Payer: Self-pay | Admitting: Dermatology

## 2020-07-01 ENCOUNTER — Telehealth (INDEPENDENT_AMBULATORY_CARE_PROVIDER_SITE_OTHER): Payer: 59 | Admitting: Psychiatry

## 2020-07-01 ENCOUNTER — Encounter: Payer: Self-pay | Admitting: Psychiatry

## 2020-07-01 ENCOUNTER — Other Ambulatory Visit: Payer: Self-pay

## 2020-07-01 ENCOUNTER — Other Ambulatory Visit: Payer: Self-pay | Admitting: Psychiatry

## 2020-07-01 DIAGNOSIS — F411 Generalized anxiety disorder: Secondary | ICD-10-CM

## 2020-07-01 DIAGNOSIS — F410 Panic disorder [episodic paroxysmal anxiety] without agoraphobia: Secondary | ICD-10-CM

## 2020-07-01 DIAGNOSIS — F331 Major depressive disorder, recurrent, moderate: Secondary | ICD-10-CM

## 2020-07-01 MED ORDER — BUSPIRONE HCL 10 MG PO TABS: 10.0000 mg | ORAL_TABLET | Freq: Three times a day (TID) | ORAL | 0 refills | Status: DC

## 2020-07-01 MED FILL — busPIRone HCL 10 MG TABS: 10 | 30 days supply | Qty: 90 | Fill #0

## 2020-07-01 NOTE — Patient Instructions (Signed)
Paroxetine tablets What is this medicine? PAROXETINE (pa ROX e teen) is used to treat depression. It may also be used to treat anxiety disorders, obsessive compulsive disorder, panic attacks, post traumatic stress, and premenstrual dysphoric disorder (PMDD). This medicine may be used for other purposes; ask your health care provider or pharmacist if you have questions. COMMON BRAND NAME(S): Paxil, Pexeva What should I tell my health care provider before I take this medicine? They need to know if you have any of these conditions:  bipolar disorder or a family history of bipolar disorder  bleeding disorders  glaucoma  heart disease  kidney disease  liver disease  low levels of sodium in the blood  seizures  suicidal thoughts, plans, or attempt; a previous suicide attempt by you or a family member  take MAOIs like Carbex, Eldepryl, Marplan, Nardil, and Parnate  take medicines that treat or prevent blood clots  thyroid disease  an unusual or allergic reaction to paroxetine, other medicines, foods, dyes, or preservatives  pregnant or trying to get pregnant  breast-feeding How should I use this medicine? Take this medicine by mouth with a glass of water. Follow the directions on the prescription label. You can take it with or without food. Take your medicine at regular intervals. Do not take your medicine more often than directed. Do not stop taking this medicine suddenly except upon the advice of your doctor. Stopping this medicine too quickly may cause serious side effects or your condition may worsen. A special MedGuide will be given to you by the pharmacist with each prescription and refill. Be sure to read this information carefully each time. Talk to your pediatrician regarding the use of this medicine in children. Special care may be needed. Overdosage: If you think you have taken too much of this medicine contact a poison control center or emergency room at once. NOTE:  This medicine is only for you. Do not share this medicine with others. What if I miss a dose? If you miss a dose, take it as soon as you can. If it is almost time for your next dose, take only that dose. Do not take double or extra doses. What may interact with this medicine? Do not take this medicine with any of the following medications:  linezolid  MAOIs like Carbex, Eldepryl, Marplan, Nardil, and Parnate  methylene blue (injected into a vein)  pimozide  thioridazine This medicine may also interact with the following medications:  alcohol  amphetamines  aspirin and aspirin-like medicines  atomoxetine  certain medicines for depression, anxiety, or psychotic disturbances  certain medicines for irregular heart beat like propafenone, flecainide, encainide, and quinidine  certain medicines for migraine headache like almotriptan, eletriptan, frovatriptan, naratriptan, rizatriptan, sumatriptan, zolmitriptan  cimetidine  digoxin  diuretics  fentanyl  fosamprenavir  furazolidone  isoniazid  lithium  medicines that treat or prevent blood clots like warfarin, enoxaparin, and dalteparin  medicines for sleep  NSAIDs, medicines for pain and inflammation, like ibuprofen or naproxen  phenobarbital  phenytoin  procarbazine  rasagiline  ritonavir  supplements like St. John's wort, kava kava, valerian  tamoxifen  tramadol  tryptophan This list may not describe all possible interactions. Give your health care provider a list of all the medicines, herbs, non-prescription drugs, or dietary supplements you use. Also tell them if you smoke, drink alcohol, or use illegal drugs. Some items may interact with your medicine. What should I watch for while using this medicine? Tell your doctor if your symptoms do not   get better or if they get worse. Visit your doctor or health care professional for regular checks on your progress. Because it may take several weeks to see  the full effects of this medicine, it is important to continue your treatment as prescribed by your doctor. Patients and their families should watch out for new or worsening thoughts of suicide or depression. Also watch out for sudden changes in feelings such as feeling anxious, agitated, panicky, irritable, hostile, aggressive, impulsive, severely restless, overly excited and hyperactive, or not being able to sleep. If this happens, especially at the beginning of treatment or after a change in dose, call your health care professional. You may get drowsy or dizzy. Do not drive, use machinery, or do anything that needs mental alertness until you know how this medicine affects you. Do not stand or sit up quickly, especially if you are an older patient. This reduces the risk of dizzy or fainting spells. Alcohol may interfere with the effect of this medicine. Avoid alcoholic drinks. Your mouth may get dry. Chewing sugarless gum or sucking hard candy, and drinking plenty of water will help. Contact your doctor if the problem does not go away or is severe. What side effects may I notice from receiving this medicine? Side effects that you should report to your doctor or health care professional as soon as possible:  allergic reactions like skin rash, itching or hives, swelling of the face, lips, or tongue  anxious  black, tarry stools  changes in vision  confusion  elevated mood, decreased need for sleep, racing thoughts, impulsive behavior  eye pain  fast, irregular heartbeat  feeling faint or lightheaded, falls  feeling agitated, angry, or irritable  hallucination, loss of contact with reality  loss of balance or coordination  loss of memory  painful or prolonged erections  restlessness, pacing, inability to keep still  seizures  stiff muscles  suicidal thoughts or other mood changes  trouble sleeping  unusual bleeding or bruising  unusually weak or tired  vomiting Side  effects that usually do not require medical attention (report to your doctor or health care professional if they continue or are bothersome):  change in appetite or weight  change in sex drive or performance  diarrhea  dizziness  dry mouth  increased sweating  indigestion, nausea  tired  tremors This list may not describe all possible side effects. Call your doctor for medical advice about side effects. You may report side effects to FDA at 1-800-FDA-1088. Where should I keep my medicine? Keep out of the reach of children. Store at room temperature between 15 and 30 degrees C (59 and 86 degrees F). Keep container tightly closed. Throw away any unused medicine after the expiration date. NOTE: This sheet is a summary. It may not cover all possible information. If you have questions about this medicine, talk to your doctor, pharmacist, or health care provider.  2020 Elsevier/Gold Standard (2016-01-07 15:50:32)  

## 2020-07-01 NOTE — Progress Notes (Signed)
Virtual Visit via Video Note  I connected with Jamie [name] on 07/01/20 at  9:20 AM EST by a video enabled telemedicine application and verified that I am speaking with the correct person using two identifiers.  Location Provider Location : [facility] Patient Location : Car  Participants: Patient , Provider   I discussed the limitations of evaluation and management by telemedicine and the availability of in person appointments. The patient expressed understanding and agreed to proceed. I discussed the assessment and treatment plan with the patient. The patient was provided an opportunity to ask questions and all were answered. The patient agreed with the plan and demonstrated an understanding of the instructions. The patient was advised to call back or seek an in-person evaluation if the symptoms worsen or if the condition fails to improve as anticipated.   [place] MD OP Progress Note  07/01/2020 12:26 PM Marylyn Appenzeller  MRN:  [id number]  Chief Complaint:  Chief Complaint    Follow-up     HPI: Jamie [name] is a 49 year old Caucasian female, employed, divorced, lives in [place] with her parents, has a history of MDD, GAD, panic attacks, chronic pain was evaluated by telemedicine today.  Patient today reports she is currently struggling with anxiety symptoms.  She continues to struggle with work-related stressors.  She is currently looking for a change and is interviewing.  She is hopeful that she will make it.  Patient reports she did not start the Paxil as discussed last visit.  She does not remember what happened.  She stopped the Cymbalta.  Currently takes only BuSpar.  She continues to struggle with teeth grinding at night as well as during the day.  It does affect her.  She does not use her oral device at this time.  Patient denies any suicidality, homicidality or perceptual disturbances.  She continues to have sleep problems and does not want to take her sleep medication regularly.  She  does have Seroquel available which she takes sometimes and that helps.  She however does not want to sleep too deep since she wants to be aware if her elderly parents need her help.  Patient is in psychotherapy sessions with Ms. Zadie Rhine and reports therapy sessions are beneficial.    Visit Diagnosis:    ICD-10-CM   1. MDD (major depressive disorder), recurrent episode, moderate (HCC)  F33.1 busPIRone (BUSPAR) 10 MG tablet  2. GAD (generalized anxiety disorder)  F41.1 busPIRone (BUSPAR) 10 MG tablet  3. Panic attack  F41.0     Past Psychiatric History: I have reviewed past psychiatric history from my progress note on 04/26/2020.  Past trials of Trintellix-expensive, Seroquel, Abilify, Effexor, sertraline, Cymbalta - bruxism, Belsomra, trazodone, Ambien, Klonopin  Past Medical History:  Past Medical History:  Diagnosis Date  . Anxiety    takes Ativan daily.  Panic Attack  . Arthritis   . Chronic back pain    herniated disc/stenosis/scoliosis  . Depression    takes Effexor daily  . Eczema    uses a cream daily as needed  . Family history of adverse reaction to anesthesia    pta dad is very hard to wake up;excessive nausea  . History of bronchitis    > 11yrago  . Hypoglycemia   . Insomnia    takes Lorazepam nightly  . Joint pain   . PONV (postoperative nausea and vomiting)   . Seasonal allergies    uses Flonase daily  . Vitamin D deficiency    takes Vit D  daily    Past Surgical History:  Procedure Laterality Date  . BACK SURGERY  2008/2012   laminectomy 1st time and 2nd time laminectomy and fusion  . COLONOSCOPY WITH PROPOFOL N/A 05/27/2020   Procedure: COLONOSCOPY WITH PROPOFOL;  Surgeon: Virgel Manifold, MD;  Location: [facility] END[facility]COPY;  Service: Gastroenterology;  Laterality: N/A;  COVID POSITIVE AUGUST 16  . MAXIMUM ACCESS (MAS)POSTERIOR LUMBAR INTERBODY FUSION (PLIF) 2 LEVEL N/A 09/01/2014   Procedure: Lumbar four-five, Lumbar five-Sacral one Maximum Access  Surgery posterior lumbar interbody fusion with interbody prosthesis posterior lateral arthrodesis posterior segmental instrumentation;  Surgeon: Elaina Hoops, MD;  Location: [facility];  Service: Neurosurgery;  Laterality: N/A;  Lumbar four-five, Lumbar five-Sacral one Maximum Access Surgery posterior lumbar interbody fusion with i    Family Psychiatric History: I have reviewed family psychiatric history from my progress note on 04/26/2020  Family History:  Family History  Problem Relation Age of Onset  . Arthritis Mother   . Hyperlipidemia Mother   . Migraines Mother   . Arthritis Father   . Diabetes Father   . Heart disease Father   . Diabetes Brother   . Breast cancer Maternal Grandmother   . Mental illness Neg Hx     Social History: I have reviewed social history from my progress note on 04/26/2020 Social History   Socioeconomic History  . Marital status: Divorced    Spouse name: Not on file  . Number of children: Not on file  . Years of education: Not on file  . Highest education level: Bachelor's degree (e.g., BA, AB, BS)  Occupational History  . Not on file  Tobacco Use  . Smoking status: Former Smoker    Packs/day: 0.50    Years: 20.00    Pack years: 10.00    Quit date: 10/18/2016    Years since quitting: 3.7  . Smokeless tobacco: Never Used  Vaping Use  . Vaping Use: Never used  Substance and Sexual Activity  . Alcohol use: No  . Drug use: No  . Sexual activity: Not Currently  Other Topics Concern  . Not on file  Social History Narrative  . Not on file   Social Determinants of Health   Financial Resource Strain:   . Difficulty of Paying Living Expenses: Not on file  Food Insecurity:   . Worried About Charity fundraiser in the Last Year: Not on file  . Ran Out of Food in the Last Year: Not on file  Transportation Needs:   . Lack of Transportation (Medical): Not on file  . Lack of Transportation (Non-Medical): Not on file  Physical Activity:   . Days of  Exercise per Week: Not on file  . Minutes of Exercise per Session: Not on file  Stress:   . Feeling of Stress : Not on file  Social Connections:   . Frequency of Communication with Friends and Family: Not on file  . Frequency of Social Gatherings with Friends and Family: Not on file  . Attends Religious Services: Not on file  . Active Member of Clubs or Organizations: Not on file  . Attends Archivist Meetings: Not on file  . Marital Status: Not on file    Allergies:  Allergies  Allergen Reactions  . Shellfish Allergy Hives  . Flexeril [Cyclobenzaprine] Hives    RAPID HEARTBEAT    Metabolic Disorder Labs: No results found for: HGBA1C, MPG No results found for: PROLACTIN Lab Results  Component Value Date  CHOL 193 04/04/2020   TRIG 54 04/04/2020   HDL 59 04/04/2020   CHOLHDL 3.6 10/16/2016   LDLCALC 124 (H) 04/04/2020   LDLCALC 107 (H) 02/06/2018   Lab Results  Component Value Date   TSH 1.560 04/04/2020   TSH 1.290 02/06/2018    Therapeutic Level Labs: No results found for: LITHIUM No results found for: VALPROATE No components found for:  CBMZ  Current Medications: Current Outpatient Medications  Medication Sig Dispense Refill  . albuterol (VENTOLIN HFA) 108 (90 Base) MCG/ACT inhaler Inhale 2 puffs into the lungs every 6 (six) hours as needed for wheezing or shortness of breath. 8 g 0  . Ascorbic Acid (VITAMIN C PO) Take by mouth.    . benzonatate (TESSALON) 200 MG capsule Take 1 capsule (200 mg total) by mouth 2 (two) times daily as needed for cough. 20 capsule 0  . busPIRone (BUSPAR) 10 MG tablet Take 1 tablet (10 mg total) by mouth 3 (three) times daily. 90 tablet 0  . cholecalciferol (VITAMIN D) 1000 UNITS tablet Take 1,000 Units by mouth daily.    Stasia Cavalier (EUCRISA) 2 % OINT Apply 1 application topically 2 (two) times daily as needed. 60 g 6  . dextromethorphan 15 MG/5ML syrup Take 10 mLs (30 mg total) by mouth 4 (four) times daily as needed  for cough. (Patient not taking: Reported on 04/26/2020) 120 mL 0  . fluticasone (FLONASE) 50 MCG/ACT nasal spray Place 1 spray into both nostrils daily as needed for allergies.     Marland Kitchen gabapentin (NEURONTIN) 600 MG tablet Take 600 mg by mouth 3 (three) times daily.     . halobetasol (ULTRAVATE) 0.05 % cream Apply topically 2 (two) times daily as needed. Up to 2 weeks. Avoid face, groin, underarms 50 g 2  . meloxicam (MOBIC) 7.5 MG tablet Take 7.5 mg by mouth 2 (two) times daily.    . metaxalone (SKELAXIN) 800 MG tablet Take 800 mg by mouth 3 (three) times daily.    . montelukast (SINGULAIR) 10 MG tablet Take 1 tablet (10 mg total) by mouth at bedtime. 90 tablet 3  . morphine (KADIAN) 30 MG 24 hr capsule Take 30 mg by mouth daily.    Marland Kitchen morphine (MS CONTIN) 30 MG 12 hr tablet Take 30 mg by mouth every morning.    . Multiple Vitamin (MULTIVITAMIN ADULT PO) Take by mouth daily.    . Multiple Vitamins-Minerals (ZINC PO) Take by mouth.    . oxyCODONE (ROXICODONE) 15 MG immediate release tablet Take 15 mg by mouth every 6 (six) hours as needed.   0  . PARoxetine (PAXIL) 10 MG tablet Take 1 tablet (10 mg total) by mouth daily. 30 tablet 1  . propranolol (INDERAL) 20 MG tablet Take 1 tablet (20 mg total) by mouth 2 (two) times daily as needed. For severe panic attacks 60 tablet 1  . QUEtiapine (SEROQUEL) 50 MG tablet Take 1 tablet (50 mg total) by mouth at bedtime. 90 tablet 1  . SUPREP BOWEL PREP KIT 17.5-3.13-1.6 GM/177ML SOLN Take by mouth as directed.     . triamcinolone cream (KENALOG) 0.1 % Apply 1 application topically 2 (two) times daily. 60 g 6   No current facility-administered medications for this visit.     Musculoskeletal: Strength & Muscle Tone: UTA Gait & Station: Seated Patient leans: N/A  Psychiatric Specialty Exam: Review of Systems  Psychiatric/Behavioral: Positive for dysphoric mood and sleep disturbance. The patient is nervous/anxious.   All other systems reviewed  and are  negative.   Last menstrual period 06/20/2016.There is no height or weight on file to calculate BMI.  General Appearance: Casual  Eye Contact:  Fair  Speech:  Clear and Coherent  Volume:  Normal  Mood:  Anxious and Dysphoric  Affect:  Congruent  Thought Process:  Goal Directed and Descriptions of Associations: Intact  Orientation:  Full (Time, Place, and Person)  Thought Content: Logical   Suicidal Thoughts:  No  Homicidal Thoughts:  No  Memory:  Immediate;   Fair Recent;   Fair Remote;   Fair  Judgement:  Fair  Insight:  Fair  Psychomotor Activity:  Normal  Concentration:  Concentration: Fair and Attention Span: Fair  Recall:  [employer] of Knowledge: Fair  Language: Fair  Akathisia:  No  Handed:  Right  AIMS (if indicated): UTA  Assets:  Communication Skills Desire for Improvement Housing Social Support  ADL's:  Intact  Cognition: WNL  Sleep:  restless   Screenings: GAD-7     Video Visit from 04/26/2020 in [facility] Visit from 04/04/2020 in [facility] Video Visit from 01/26/2020 in [facility] Video Visit from 11/04/2019 in [facility] Visit from 07/24/2019 in [facility]  Total GAD-7 Score _0 PHQ2-9     Video Visit from 04/26/2020 in [facility] Office Visit from 04/04/2020 in [facility] Video Visit from 01/26/2020 in [facility] Video Visit from 11/04/2019 in [facility] Visit from 09/14/2019 in [facility]  PHQ-2 Total Score _1 PHQ-9 Total Score _2 Assessment and Plan: Jamie [name] is a 49 year old Caucasian female, divorced, employed, lives in [place], has a history of MDD, GAD, panic attacks, seborrheic keratitis, chronic pain was evaluated by telemedicine today.  Patient with psychosocial stressors of interpersonal problems at  work, COVID-19 pandemic, her own health issues, parents health problems.  Patient continues to struggle with anxiety and has been noncompliant with medication regimen as discussed last visit.  Discussed plan as noted below.  Plan MDD-unstable Start Paxil 10 mg p.o. daily Reduce BuSpar 10 mg p.o. 3 times daily.  Dosage being reduced due to her bruxism. Seroquel 50 mg p.o. nightly as needed for sleep Continue CBT with Ms. Zadie Rhine. Discussed with patient to let writer know if she is interested in intermittent FMLA.  GAD-unstable Reduce BuSpar to 10 mg p.o. 3 times daily Start Paxil 10 mg p.o. daily Propranolol 20 mg p.o. twice daily as needed for anxiety attacks  Panic attacks-stable Continue CBT Propranolol as needed as noted above.  Pending labs-hemoglobin A1c.  Pending EKG to monitor QTC.  Encouraged compliance.  Provided education.  Patient also advised to talk to her dentist about an oral device for her bruxism.  Follow-up in clinic in 4 weeks or sooner if needed.  I have spent atleast 20 minutes face to face by video with patient today. More than 50 % of the time was spent for preparing to see the patient ( e.g., review of test, records ),  ordering medications and test ,psychoeducation and supportive psychotherapy and care coordination,as well as documenting clinical information in electronic health record. This note was generated in part or whole with voice recognition software. Voice recognition is usually quite accurate but there are transcription errors that can and very often do  occur. I apologize for any typographical errors that were not detected and corrected.        Ursula Alert, MD 07/01/2020, 12:26 PM

## 2020-07-04 MED FILL — oxyCODONE HCL 15 MG TABS: 15 | 30 days supply | Qty: 120 | Fill #0

## 2020-07-04 MED FILL — MELOXICAM 7.5 MG TABLET: 7.5 | 30 days supply | Qty: 60 | Fill #1

## 2020-07-04 MED FILL — METAXALONE 800 MG TABS: 800 | 30 days supply | Qty: 120 | Fill #1

## 2020-07-04 MED FILL — GABAPENTIN 600 MG TABLET: 600 | 30 days supply | Qty: 90 | Fill #1

## 2020-07-04 MED FILL — MORPHINE SULF ER 30 MG TAB: 30 | 30 days supply | Qty: 30 | Fill #0

## 2020-07-18 ENCOUNTER — Other Ambulatory Visit: Payer: Self-pay

## 2020-07-18 ENCOUNTER — Encounter: Payer: Self-pay | Admitting: Dermatology

## 2020-07-18 ENCOUNTER — Ambulatory Visit (INDEPENDENT_AMBULATORY_CARE_PROVIDER_SITE_OTHER): Payer: 59 | Admitting: Dermatology

## 2020-07-18 DIAGNOSIS — L409 Psoriasis, unspecified: Secondary | ICD-10-CM

## 2020-07-18 DIAGNOSIS — L820 Inflamed seborrheic keratosis: Secondary | ICD-10-CM

## 2020-07-18 DIAGNOSIS — D489 Neoplasm of uncertain behavior, unspecified: Secondary | ICD-10-CM

## 2020-07-18 DIAGNOSIS — D224 Melanocytic nevi of scalp and neck: Secondary | ICD-10-CM

## 2020-07-18 DIAGNOSIS — L988 Other specified disorders of the skin and subcutaneous tissue: Secondary | ICD-10-CM | POA: Diagnosis not present

## 2020-07-18 NOTE — Patient Instructions (Signed)
Wound Care Instructions ° °1. Cleanse wound gently with soap and water once a day then pat dry with clean gauze. Apply a thing coat of Petrolatum (petroleum jelly, "Vaseline") over the wound (unless you have an allergy to this). We recommend that you use a new, sterile tube of Vaseline. Do not pick or remove scabs. Do not remove the yellow or white "healing tissue" from the base of the wound. ° °2. Cover the wound with fresh, clean, nonstick gauze and secure with paper tape. You may use Band-Aids in place of gauze and tape if the would is small enough, but would recommend trimming much of the tape off as there is often too much. Sometimes Band-Aids can irritate the skin. ° °3. You should call the office for your biopsy report after 1 week if you have not already been contacted. ° °4. If you experience any problems, such as abnormal amounts of bleeding, swelling, significant bruising, significant pain, or evidence of infection, please call the office immediately. ° °Cryotherapy Aftercare ° °• Wash gently with soap and water everyday.   °• Apply Vaseline and Band-Aid daily until healed. ° ° °

## 2020-07-18 NOTE — Progress Notes (Signed)
   Follow-Up Visit   Subjective  Jamie [name] is a 49 y.o. 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  She has an itchy growth on her back that she would like removed. Patient is also treating dermatitis vs psoriasis of the palms and soles. She is using Eucrisa ointment and halobetasol cream. Halobetasol cream isn't helping any.  She is currently being treated for depression.  The following portions of the chart were reviewed this encounter and updated as appropriate:     Review of Systems:  No other skin or systemic complaints except as noted in HPI or Assessment and Plan.  Objective  Well appearing patient in no apparent distress; mood and affect are within normal limits.  A focused examination was performed including the post neck. Relevant physical exam findings are noted in the Assessment and Plan.  Objective  post neck: Pink biopsy site.  Objective  Palms and soles: Pustules and dried microvesicles, erythema and scale of bil palms/soles.  Objective  Left Mid Back: Erythematous keratotic or waxy stuck-on papule    Assessment & Plan  Neoplasm of uncertain behavior post neck  Epidermal / dermal shaving  Lesion diameter (cm):  0.8 Informed consent: discussed and consent obtained   Patient was prepped and draped in usual sterile fashion: Area prepped with alcohol. Anesthesia: the lesion was anesthetized in a standard fashion   Anesthetic:  1% lidocaine w/ epinephrine 1-100,000 buffered w/ 8.4% NaHCO3 Instrument used: flexible razor blade   Hemostasis achieved with: pressure, aluminum chloride and electrodesiccation   Outcome: patient tolerated procedure well   Post-procedure details: wound care instructions given   Post-procedure details comment:  Ointment and small bandage applied  Specimen 1 - Surgical pathology Differential Diagnosis: Biopsy proven Dysplastic Nevus with Mod to Severe  Atypia Check Margins: Yes Pink biopsy site. [id number]  Bx-proven Mod/severe dysplastic nevus Larger area removed by shave today and sent for path  Psoriasis Palms and soles  Continue Eucrisa ointment qd/bid. D/C halobetasol cream Start Duobrii Apply to Aas qhs - samples given x 3. Patient will call for Rx if improves.  Discussed starting Otezla or biologic. Patient will discuss with her doctor about starting Rutherford Nail since she is being treated for depression. Rutherford Nail information given.  Side effects of Otezla (apremilast) include diarrhea, nausea, headache, upper respiratory infection, depression, and weight decrease (5-10%). It should only be taken by pregnant women after a discussion regarding risks and benefits with their doctor. Goal is control of skin condition, not cure.  The use of Rutherford Nail requires long term medication management, including periodic office visits.   Inflamed seborrheic keratosis Left Mid Back  Destruction of lesion - Left Mid Back  Destruction method: cryotherapy   Informed consent: discussed and consent obtained   Lesion destroyed using liquid nitrogen: Yes   Region frozen until ice ball extended beyond lesion: Yes   Outcome: patient tolerated procedure well with no complications   Post-procedure details: wound care instructions given    Return in about 6 weeks (around 08/29/2020) for derm vs psoriasis.   Documentation: I have reviewed the above documentation for accuracy and completeness, and I agree with the above.  [name] MD

## 2020-07-19 ENCOUNTER — Telehealth: Payer: Self-pay

## 2020-07-19 NOTE — Telephone Encounter (Signed)
-----   Message from [name], MD sent at 07/19/2020  2:26 PM EST ----- Skin , post neck NO RESIDUAL DYSPLASTIC NEVUS, MARGINS FREE  Please call patient

## 2020-07-19 NOTE — Telephone Encounter (Signed)
Left message for patient to call.

## 2020-07-19 NOTE — Telephone Encounter (Signed)
Patient advised of biopsy results.

## 2020-07-29 ENCOUNTER — Other Ambulatory Visit: Payer: Self-pay | Admitting: Internal Medicine

## 2020-07-29 ENCOUNTER — Ambulatory Visit: Payer: 59 | Attending: Internal Medicine

## 2020-07-29 DIAGNOSIS — Z23 Encounter for immunization: Secondary | ICD-10-CM

## 2020-07-29 NOTE — Progress Notes (Signed)
   Covid-19 Vaccination Clinic  Name:  Jamie [name]    MRN: [id number] DOB: 08-Apr-1971  07/29/2020  [name] was observed post Covid-19 immunization for 15 minutes without incident. She was provided with Vaccine Information Sheet and instruction to access the V-Safe system.   [name] was instructed to call 911 with any severe reactions post vaccine: Marland Kitchen Difficulty breathing  . Swelling of face and throat  . A fast heartbeat  . A bad rash all over body  . Dizziness and weakness   Immunizations Administered    Name Date Dose VIS Date Route   Pfizer COVID-19 Vaccine 07/29/2020  8:30 AM 0.3 mL 06/08/2020 Intramuscular   Manufacturer: [place]   Lot: [id number]   [place]: [id number]

## 2020-08-01 ENCOUNTER — Other Ambulatory Visit: Payer: Self-pay | Admitting: Neurosurgery

## 2020-08-01 DIAGNOSIS — M5136 Other intervertebral disc degeneration, lumbar region: Secondary | ICD-10-CM | POA: Diagnosis not present

## 2020-08-01 DIAGNOSIS — M961 Postlaminectomy syndrome, not elsewhere classified: Secondary | ICD-10-CM | POA: Diagnosis not present

## 2020-08-01 MED FILL — MELOXICAM 7.5 MG TABLET: 7.5 | 30 days supply | Qty: 60 | Fill #0

## 2020-08-01 MED FILL — GABAPENTIN 600 MG TABLET: 600 | 30 days supply | Qty: 90 | Fill #0

## 2020-08-01 MED FILL — METAXALONE 800 MG TABS: 800 | 30 days supply | Qty: 120 | Fill #0

## 2020-08-02 MED FILL — oxyCODONE HCL 15 MG TABS: 15 | 30 days supply | Qty: 120 | Fill #0

## 2020-08-02 MED FILL — MORPHINE SULF ER 30 MG TAB: 30 | 30 days supply | Qty: 30 | Fill #0

## 2020-08-03 ENCOUNTER — Encounter: Payer: Self-pay | Admitting: Psychiatry

## 2020-08-03 ENCOUNTER — Other Ambulatory Visit: Payer: Self-pay | Admitting: Psychiatry

## 2020-08-03 ENCOUNTER — Other Ambulatory Visit: Payer: Self-pay

## 2020-08-03 ENCOUNTER — Telehealth (INDEPENDENT_AMBULATORY_CARE_PROVIDER_SITE_OTHER): Payer: 59 | Admitting: Psychiatry

## 2020-08-03 DIAGNOSIS — F331 Major depressive disorder, recurrent, moderate: Secondary | ICD-10-CM | POA: Diagnosis not present

## 2020-08-03 DIAGNOSIS — F411 Generalized anxiety disorder: Secondary | ICD-10-CM

## 2020-08-03 DIAGNOSIS — F410 Panic disorder [episodic paroxysmal anxiety] without agoraphobia: Secondary | ICD-10-CM

## 2020-08-03 MED ORDER — BUSPIRONE HCL 10 MG PO TABS: 10.0000 mg | ORAL_TABLET | Freq: Three times a day (TID) | ORAL | 1 refills | Status: DC

## 2020-08-03 MED ORDER — PAROXETINE HCL 10 MG PO TABS: 10.0000 mg | ORAL_TABLET | Freq: Every day | ORAL | 1 refills | Status: DC

## 2020-08-03 MED ORDER — PROPRANOLOL HCL 20 MG PO TABS: 20.0000 mg | ORAL_TABLET | Freq: Two times a day (BID) | ORAL | 1 refills | Status: DC | PRN

## 2020-08-03 MED FILL — busPIRone HCL 10 MG TABS: 10 | 30 days supply | Qty: 90 | Fill #0

## 2020-08-03 MED FILL — PROPRANOLOL 20 MG TABLET: 20 | 30 days supply | Qty: 60 | Fill #0

## 2020-08-03 MED FILL — PARoxetine HCL 10 MG TABS: 10 | 30 days supply | Qty: 30 | Fill #0

## 2020-08-03 NOTE — Progress Notes (Signed)
Virtual Visit via Telephone Note  I connected with Jamie [name] on 08/03/20 at  8:30 AM EST by telephone and verified that I am speaking with the correct person using two identifiers.  Location Provider Location : [facility] Patient Location : Work  Participants: Patient , Provider   I discussed the limitations, risks, security and privacy concerns of performing an evaluation and management service by telephone and the availability of in person appointments. I also discussed with the patient that there may be a patient responsible charge related to this service. The patient expressed understanding and agreed to proceed.    I discussed the assessment and treatment plan with the patient. The patient was provided an opportunity to ask questions and all were answered. The patient agreed with the plan and demonstrated an understanding of the instructions.   The patient was advised to call back or seek an in-person evaluation if the symptoms worsen or if the condition fails to improve as anticipated.  [place] MD OP Progress Note  08/03/2020 2:34 PM Jamie [name]  MRN:  [id number]  Chief Complaint:  Chief Complaint    Follow-up     HPI: Jamie [name] is a 49 year old Caucasian female, employed, divorced, lives in [place] with her parents, has a history of MDD, GAD, panic attacks, chronic pain was evaluated by telemedicine today.  Patient today reports she is currently taking the Paxil.  She reports she started it couple of weeks ago.  She has not noticed any side effects to it yet.  She reports the Paxil helps to relax her.  It also helps her to fall asleep quicker.  She reports she has noticed an improvement in her sleep.  She does wake up after 2 hours or so and it takes her time to fall back asleep however it is an improvement that she is able to fall back asleep .  She reports her panic symptoms have improved.  She reports work situation has improved.  She has applied for several jobs and is  planning to start at a new job soon.  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  Patient denies any suicidality, homicidality or perceptual disturbances.  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  She reports since starting the Paxil she has not noticed any worsening of her bruxism and it may have improved some.  She is currently on a lower dosage of BuSpar, she may have had some withdrawal symptoms like ' brain zaps', initially however that went away.  Patient denies any other concerns today.  Visit Diagnosis:    ICD-10-CM   1. MDD (major depressive disorder), recurrent episode, moderate (HCC)  F33.1 PARoxetine (PAXIL) 10 MG tablet    propranolol (INDERAL) 20 MG tablet    busPIRone (BUSPAR) 10 MG tablet  2. GAD (generalized anxiety disorder)  F41.1 PARoxetine (PAXIL) 10 MG tablet    propranolol (INDERAL) 20 MG tablet    busPIRone (BUSPAR) 10 MG tablet  3. Panic attack  F41.0 PARoxetine (PAXIL) 10 MG tablet    propranolol (INDERAL) 20 MG tablet    Past Psychiatric History: I have reviewed past psychiatric history from my progress note on 04/26/2020.  Past trials of Trintellix-expensive, Seroquel, Abilify, Effexor, sertraline, Cymbalta-bruxism, Belsomra, trazodone, Ambien, Klonopin  Past Medical History:  Past Medical History:  Diagnosis Date  . Anxiety    takes Ativan daily.  Panic Attack  . Arthritis   . Chronic back pain    herniated disc/stenosis/scoliosis  .  Depression    takes Effexor daily  . Eczema    uses a cream daily as needed  . Family history of adverse reaction to anesthesia    pta dad is very hard to wake up;excessive nausea  . History of bronchitis    > 33yrago  . Hx of dysplastic nevus 05/10/2020   neck - posterior, Severe Atypia. Shave removal 07/18/2020   . Hypoglycemia   . Insomnia    takes Lorazepam nightly  . Joint pain   . PONV (postoperative nausea and vomiting)   . Seasonal allergies    uses Flonase  daily  . Vitamin D deficiency    takes Vit D daily    Past Surgical History:  Procedure Laterality Date  . BACK SURGERY  2008/2012   laminectomy 1st time and 2nd time laminectomy and fusion  . COLONOSCOPY WITH PROPOFOL N/A 05/27/2020   Procedure: COLONOSCOPY WITH PROPOFOL;  Surgeon: TVirgel Manifold MD;  Location: [facility] END[facility]COPY;  Service: Gastroenterology;  Laterality: N/A;  COVID POSITIVE AUGUST 16  . MAXIMUM ACCESS (MAS)POSTERIOR LUMBAR INTERBODY FUSION (PLIF) 2 LEVEL N/A 09/01/2014   Procedure: Lumbar four-five, Lumbar five-Sacral one Maximum Access Surgery posterior lumbar interbody fusion with interbody prosthesis posterior lateral arthrodesis posterior segmental instrumentation;  Surgeon: GElaina Hoops MD;  [facility];  Service: Neurosurgery;  Laterality: N/A;  Lumbar four-five, Lumbar five-Sacral one Maximum Access Surgery posterior lumbar interbody fusion with i    Family Psychiatric History: I have reviewed family psychiatric history from my progress note on 04/26/2020  Family History:  Family History  Problem Relation Age of Onset  . Arthritis Mother   . Hyperlipidemia Mother   . Migraines Mother   . Arthritis Father   . Diabetes Father   . Heart disease Father   . Diabetes Brother   . Breast cancer Maternal Grandmother   . Mental illness Neg Hx     Social History: Reviewed social history from my progress note on 04/26/2020 Social History   Socioeconomic History  . Marital status: Divorced    Spouse name: Not on file  . Number of children: Not on file  . Years of education: Not on file  . Highest education level: Bachelor's degree (e.g., BA, AB, BS)  Occupational History  . Not on file  Tobacco Use  . Smoking status: Former Smoker    Packs/day: 0.50    Years: 20.00    Pack years: 10.00    Quit date: 10/18/2016    Years since quitting: 3.7  . Smokeless tobacco: Never Used  Vaping Use  . Vaping Use: Never used  Substance and Sexual Activity  .  Alcohol use: No  . Drug use: No  . Sexual activity: Not Currently  Other Topics Concern  . Not on file  Social History Narrative  . Not on file   Social Determinants of Health   Financial Resource Strain: Not on file  Food Insecurity: Not on file  Transportation Needs: Not on file  Physical Activity: Not on file  Stress: Not on file  Social Connections: Not on file    Allergies:  Allergies  Allergen Reactions  . Shellfish Allergy Hives  . Flexeril [Cyclobenzaprine] Hives    RAPID HEARTBEAT    Metabolic Disorder Labs: No results found for: HGBA1C, MPG No results found for: PROLACTIN Lab Results  Component Value Date   CHOL 193 04/04/2020   TRIG 54 04/04/2020   HDL 59 04/04/2020   CHOLHDL 3.6 10/16/2016  [place] 124 (H) 04/04/2020   LDLCALC 107 (H) 02/06/2018   Lab Results  Component Value Date   TSH 1.560 04/04/2020   TSH 1.290 02/06/2018    Therapeutic Level Labs: No results found for: LITHIUM No results found for: VALPROATE No components found for:  CBMZ  Current Medications: Current Outpatient Medications  Medication Sig Dispense Refill  . albuterol (VENTOLIN HFA) 108 (90 Base) MCG/ACT inhaler Inhale 2 puffs into the lungs every 6 (six) hours as needed for wheezing or shortness of breath. 8 g 0  . Ascorbic Acid (VITAMIN C PO) Take by mouth.    . benzonatate (TESSALON) 200 MG capsule Take 1 capsule (200 mg total) by mouth 2 (two) times daily as needed for cough. 20 capsule 0  . busPIRone (BUSPAR) 10 MG tablet Take 1 tablet (10 mg total) by mouth 3 (three) times daily. 90 tablet 1  . cholecalciferol (VITAMIN D) 1000 UNITS tablet Take 1,000 Units by mouth daily.    Stasia Cavalier (EUCRISA) 2 % OINT Apply 1 application topically 2 (two) times daily as needed. 60 g 6  . dextromethorphan 15 MG/5ML syrup Take 10 mLs (30 mg total) by mouth 4 (four) times daily as needed for cough. (Patient not taking: Reported on 07/18/2020) 120 mL 0  . fluticasone (FLONASE) 50  MCG/ACT nasal spray Place 1 spray into both nostrils daily as needed for allergies.     Marland Kitchen gabapentin (NEURONTIN) 600 MG tablet Take 600 mg by mouth 3 (three) times daily.     . halobetasol (ULTRAVATE) 0.05 % cream Apply topically 2 (two) times daily as needed. Up to 2 weeks. Avoid face, groin, underarms 50 g 2  . meloxicam (MOBIC) 7.5 MG tablet Take 7.5 mg by mouth 2 (two) times daily.    . metaxalone (SKELAXIN) 800 MG tablet Take 800 mg by mouth 3 (three) times daily.    . montelukast (SINGULAIR) 10 MG tablet Take 1 tablet (10 mg total) by mouth at bedtime. 90 tablet 3  . morphine (KADIAN) 30 MG 24 hr capsule Take 30 mg by mouth daily.     Marland Kitchen morphine (MS CONTIN) 30 MG 12 hr tablet Take 30 mg by mouth every morning.     . Multiple Vitamin (MULTIVITAMIN ADULT PO) Take by mouth daily.    . Multiple Vitamins-Minerals (ZINC PO) Take by mouth.    . oxyCODONE (ROXICODONE) 15 MG immediate release tablet Take 15 mg by mouth every 6 (six) hours as needed.   0  . PARoxetine (PAXIL) 10 MG tablet Take 1 tablet (10 mg total) by mouth daily. 30 tablet 1  . propranolol (INDERAL) 20 MG tablet Take 1 tablet (20 mg total) by mouth 2 (two) times daily as needed. For severe panic attacks 60 tablet 1  . QUEtiapine (SEROQUEL) 50 MG tablet Take 1 tablet (50 mg total) by mouth at bedtime. 90 tablet 1  . SUPREP BOWEL PREP KIT 17.5-3.13-1.6 GM/177ML SOLN Take by mouth as directed.     . triamcinolone cream (KENALOG) 0.1 % Apply 1 application topically 2 (two) times daily. 60 g 6   No current facility-administered medications for this visit.     Musculoskeletal: Strength & Muscle Tone: UTA Gait & Station: UTA Patient leans: N/A  Psychiatric Specialty Exam: Review of Systems  Psychiatric/Behavioral: Positive for dysphoric mood and sleep disturbance. The patient is nervous/anxious.   All other systems reviewed and are negative.   Last menstrual period 06/20/2016.There is no height or weight on file to calculate  BMI.  General Appearance: UTA  Eye Contact:  UTA  Speech:  Clear and Coherent  Volume:  Normal  Mood:  Anxious and Depressed improving  Affect:  UTA  Thought Process:  Goal Directed and Descriptions of Associations: Intact  Orientation:  Full (Time, Place, and Person)  Thought Content: Logical   Suicidal Thoughts:  No  Homicidal Thoughts:  No  Memory:  Immediate;   Fair Recent;   Fair Remote;   Fair  Judgement:  Fair  Insight:  Fair  Psychomotor Activity:  UTA  Concentration:  Concentration: Fair and Attention Span: Fair  Recall:  [employer] of Knowledge: Fair  Language: Fair  Akathisia:  No  Handed:  Right  AIMS (if indicated):UTA  Assets:  Communication Skills Desire for [place] Talents/Skills Transportation  ADL's:  Intact  Cognition: WNL  Sleep:  Improving   Screenings: GAD-7   Flowsheet Row Video Visit from 04/26/2020 in [place] Office Visit from 04/04/2020 in [facility] Video Visit from 01/26/2020 in [facility] Video Visit from 11/04/2019 in [place] Visit from 07/24/2019 in [place]  Total GAD-7 Score 19 18 20 20 21     PHQ2-9   Flowsheet Row Video Visit from 04/26/2020 in [place] Office Visit from 04/04/2020 in [facility] Video Visit from 01/26/2020 in [facility] Video Visit from 11/04/2019 in [place] Visit from 09/14/2019 in [facility]  PHQ-2 Total Score 5 6 5 5 2   PHQ-9 Total Score 18 21 20 21 19        Assessment and Plan: Jamie [name] is a 49 year old Caucasian female, divorced, employed, lives in [place], has a history of MDD, GAD, panic attacks, chronic pain was evaluated by telemedicine today.  Patient with psychosocial stressors of interpersonal problems at work, COVID-19 pandemic, her own health issues, parents health  problems.  Patient is currently making some progress with regards to her anxiety on the current medication regimen.  Plan as noted below.  Plan MDD-improving Paxil 10 mg p.o. daily BuSpar at reduced dose of 10 mg p.o. 3 times daily.  Dosage reduced due to bruxism. Seroquel 50 mg p.o. nightly as needed for sleep Continue CBT with Ms. Zadie Rhine.  GAD-improving BuSpar as prescribed Paxil 10 mg p.o. daily Propranolol 20 mg p.o. twice daily as needed for anxiety attacks  Panic attacks-stable Continue CBT Propranolol as needed.  Follow-up in clinic in 4 weeks or sooner if needed.  I have spent atleast 20 minutes non face to face  with patient today. More than 50 % of the time was spent for preparing to see the patient ( e.g., review of test, records ), ordering medications and test ,psychoeducation and supportive psychotherapy and care coordination,as well as documenting clinical information in electronic health record. This note was generated in part or whole with voice recognition software. Voice recognition is usually quite accurate but there are transcription errors that can and very often do occur. I apologize for any typographical errors that were not detected and corrected.       Ursula Alert, MD 08/03/2020, 2:34 PM

## 2020-08-31 ENCOUNTER — Other Ambulatory Visit: Payer: Self-pay

## 2020-08-31 ENCOUNTER — Ambulatory Visit (INDEPENDENT_AMBULATORY_CARE_PROVIDER_SITE_OTHER): Payer: 59 | Admitting: Dermatology

## 2020-08-31 DIAGNOSIS — L409 Psoriasis, unspecified: Secondary | ICD-10-CM

## 2020-08-31 MED ORDER — DUOBRII 0.01-0.045 % EX LOTN: 1.0000 "application " | TOPICAL_LOTION | Freq: Every day | CUTANEOUS | 1 refills | Status: DC

## 2020-08-31 NOTE — Progress Notes (Signed)
   Follow-Up Visit   Subjective  Jamie [name] is a 50 y.o. female who presents for the following: Psoriasis (Hands, feet, Eucrisa prn not working well, Duobrii samples ran out, pt has not discussed [place] with Psychologist who txts her for her depression, pt has televisit this Friday and will discuss at that visit).  She would like an RX for Duobrii.  She has a h/o arthritis.   The following portions of the chart were reviewed this encounter and updated as appropriate:       Review of Systems:  No other skin or systemic complaints except as noted in HPI or Assessment and Plan.  Objective  Well appearing patient in no apparent distress; mood and affect are within normal limits.  A focused examination was performed including hands, feet. Relevant physical exam findings are noted in the Assessment and Plan.  Objective  bil feet, hands: Hyperkeratosis with dried microvesicles with crusted excoriations bil soles, diffuse erythema hyperkeratosis with fissures bil palms   Assessment & Plan  Psoriasis bil feet, hands  Palmarplantar- flared Psoriasis is a chronic non-curable, but treatable genetic/hereditary disease that may have other systemic features affecting other organ systems such as joints (Psoriatic Arthritis). It is associated with an increased risk of inflammatory bowel disease, heart disease, non-alcoholic fatty liver disease, and depression.     Start Duobrii qhs to aa hands and feet until clear, avoid f/g/a, samples x 2 given [id number] exp 06/2021 Pending approval to start [facility] from Psychologist, pt can call for starter pack and we will send prescription in.  Discussed Xtrac Laser, info given, will see if insurance covers.  Discussed if Psychologist doesn't approve R[place]ford Nail will consider a Biologic Cosentyx or Taltz, info given.  Side effects of Otezla (apremilast) include diarrhea, nausea, headache, upper respiratory infection, depression, and weight decrease (5-10%). It  should only be taken by pregnant women after a discussion regarding risks and benefits with their doctor. Goal is control of skin condition, not cure.  The use of [place] Nail requires long term medication management, including periodic office visits.  Reviewed risks of biologics including immunosuppression, infections, injection site reaction, and failure to improve condition. Goal is control of skin condition, not cure.  Some older biologics such as Humira and Enbrel may slightly increase risk of malignancy and may worsen congestive heart failure. The use of biologics requires long term medication management, including periodic office visits and monitoring of blood work.   Halobetasol Prop-Tazarotene (DUOBRII) 0.01-0.045 % LOTN - bil feet, hands  Return in about 2 months (around 10/29/2020) for Psoriasis f/u.   I, Othelia Pulling, RMA, am acting as scribe for [name], MD . Documentation: I have reviewed the above documentation for accuracy and completeness, and I agree with the above.  [name] MD

## 2020-09-01 MED FILL — oxyCODONE HCL 15 MG TABS: 15 | 30 days supply | Qty: 120 | Fill #0

## 2020-09-01 MED FILL — MORPHINE SULF ER 30 MG TAB: 30 | 30 days supply | Qty: 30 | Fill #0

## 2020-09-02 ENCOUNTER — Encounter: Payer: Self-pay | Admitting: Psychiatry

## 2020-09-02 ENCOUNTER — Telehealth (INDEPENDENT_AMBULATORY_CARE_PROVIDER_SITE_OTHER): Payer: 59 | Admitting: Psychiatry

## 2020-09-02 ENCOUNTER — Other Ambulatory Visit: Payer: Self-pay

## 2020-09-02 DIAGNOSIS — F3341 Major depressive disorder, recurrent, in partial remission: Secondary | ICD-10-CM | POA: Diagnosis not present

## 2020-09-02 DIAGNOSIS — F411 Generalized anxiety disorder: Secondary | ICD-10-CM | POA: Diagnosis not present

## 2020-09-02 DIAGNOSIS — F410 Panic disorder [episodic paroxysmal anxiety] without agoraphobia: Secondary | ICD-10-CM | POA: Diagnosis not present

## 2020-09-02 MED ORDER — PAROXETINE HCL 20 MG PO TABS: 20.0000 mg | ORAL_TABLET | Freq: Every day | ORAL | 1 refills | Status: DC

## 2020-09-02 NOTE — Progress Notes (Signed)
Virtual Visit via Telephone Note  I connected with Jamie [name] on 09/02/20 at  8:30 AM EST by telephone and verified that I am speaking with the correct person using two identifiers.  Location Provider Location : [facility] Patient Location : Home  Participants: Patient , Provider   I discussed the limitations, risks, security and privacy concerns of performing an evaluation and management service by telephone and the availability of in person appointments. I also discussed with the patient that there may be a patient responsible charge related to this service. The patient expressed understanding and agreed to proceed.   I discussed the assessment and treatment plan with the patient. The patient was provided an opportunity to ask questions and all were answered. The patient agreed with the plan and demonstrated an understanding of the instructions.   The patient was advised to call back or seek an in-person evaluation if the symptoms worsen or if the condition fails to improve as anticipated.   [place] [facility] MD OP Progress Note  09/02/2020 10:07 AM Jamie [name]  MRN:  [id number]  Chief Complaint:  Chief Complaint    Follow-up     HPI: Jamie [name] is a 50 year old Caucasian female, employed, divorced, lives in [place] with her parents, has a history of MDD, GAD, panic attacks, chronic pain was evaluated by telemedicine today.  Patient today reports she continues to struggle with anxiety symptoms.  She is a [employer] officer, trade [employer].  She worries about everything to the extreme.  She is often restless.  She reports her anxiety symptoms may be getting better to some extent on the current medication regimen.  She reports she feels less depressed now.  The medication has definitely helped with her depression.  She reports she is compliant on her medications.  Denies side effects.  She denies any suicidality, homicidality or perceptual disturbances.  She continues to have situational stresses of going through a  divorce, being the primary caregiver for her parents as well as job related stresses.  She reports she is starting a new job on Monday and is very excited about that.  Patient has been noncompliant with psychotherapy sessions however agrees to give the therapist a call.  She denies any other concerns today.  Visit Diagnosis:    ICD-10-CM   1. MDD (major depressive disorder), recurrent, in partial remission (HCC)  F33.41 PARoxetine (PAXIL) 20 MG tablet  2. GAD (generalized anxiety disorder)  F41.1 PARoxetine (PAXIL) 20 MG tablet  3. Panic attack  F41.0 PARoxetine (PAXIL) 20 MG tablet    Past Psychiatric History: I have reviewed past psychiatric history from my progress note on 04/26/2020.  Past trials of Trintellix-expensive, Seroquel, Abilify, Effexor, sertraline, Cymbalta-bruxism, Belsomra, trazodone, Ambien, Klonopin  Past Medical History:  Past Medical History:  Diagnosis Date  . Anxiety    takes Ativan daily.  Panic Attack  . Arthritis   . Chronic back pain    herniated disc/stenosis/scoliosis  . Depression    takes Effexor daily  . Eczema    uses a cream daily as needed  . Family history of adverse reaction to anesthesia    pta dad is very hard to wake up;excessive nausea  . History of bronchitis    > 95yrago  . Hx of dysplastic nevus 05/10/2020   neck - posterior, Severe Atypia. Shave removal 07/18/2020, margins free  . Hypoglycemia   . Insomnia    takes Lorazepam nightly  . Joint pain   . PONV (postoperative nausea and vomiting)   . Seasonal  allergies    uses Flonase daily  . Vitamin D deficiency    takes Vit D daily    Past Surgical History:  Procedure Laterality Date  . BACK SURGERY  2008/2012   laminectomy 1st time and 2nd time laminectomy and fusion  . COLONOSCOPY WITH PROPOFOL N/A 05/27/2020   Procedure: COLONOSCOPY WITH PROPOFOL;  Surgeon: Virgel Manifold, MD;  Location: [facility] ENDOSCOPY;  Service: Gastroenterology;  Laterality: N/A;  COVID POSITIVE AUGUST  16  . MAXIMUM ACCESS (MAS)POSTERIOR LUMBAR INTERBODY FUSION (PLIF) 2 LEVEL N/A 09/01/2014   Procedure: Lumbar four-five, Lumbar five-Sacral one Maximum Access Surgery posterior lumbar interbody fusion with interbody prosthesis posterior lateral arthrodesis posterior segmental instrumentation;  Surgeon: Elaina Hoops, MD;  Location: [facility];  Service: Neurosurgery;  Laterality: N/A;  Lumbar four-five, Lumbar five-Sacral one Maximum Access Surgery posterior lumbar interbody fusion with i    Family Psychiatric History: I have reviewed family psychiatric history from my progress note on 04/26/2020  Family History:  Family History  Problem Relation Age of Onset  . Arthritis Mother   . Hyperlipidemia Mother   . Migraines Mother   . Arthritis Father   . Diabetes Father   . Heart disease Father   . Diabetes Brother   . Breast cancer Maternal Grandmother   . Mental illness Neg Hx     Social History: Reviewed social history from my progress note on 04/26/2020 Social History   Socioeconomic History  . Marital status: Divorced    Spouse name: Not on file  . Number of children: Not on file  . Years of education: Not on file  . Highest education level: Bachelor's degree (e.g., BA, AB, BS)  Occupational History  . Not on file  Tobacco Use  . Smoking status: Former Smoker    Packs/day: 0.50    Years: 20.00    Pack years: 10.00    Quit date: 10/18/2016    Years since quitting: 3.8  . Smokeless tobacco: Never Used  Vaping Use  . Vaping Use: Never used  Substance and Sexual Activity  . Alcohol use: No  . Drug use: No  . Sexual activity: Not Currently  Other Topics Concern  . Not on file  Social History Narrative  . Not on file   Social Determinants of Health   Financial Resource Strain: Not on file  Food Insecurity: Not on file  Transportation Needs: Not on file  Physical Activity: Not on file  Stress: Not on file  Social Connections: Not on file    Allergies:  Allergies   Allergen Reactions  . Shellfish Allergy Hives  . Flexeril [Cyclobenzaprine] Hives    RAPID HEARTBEAT    Metabolic Disorder Labs: No results found for: HGBA1C, MPG No results found for: PROLACTIN Lab Results  Component Value Date   CHOL 193 04/04/2020   TRIG 54 04/04/2020   HDL 59 04/04/2020   CHOLHDL 3.6 10/16/2016   LDLCALC 124 (H) 04/04/2020   LDLCALC 107 (H) 02/06/2018   Lab Results  Component Value Date   TSH 1.560 04/04/2020   TSH 1.290 02/06/2018    Therapeutic Level Labs: No results found for: LITHIUM No results found for: VALPROATE No components found for:  CBMZ  Current Medications: Current Outpatient Medications  Medication Sig Dispense Refill  . PARoxetine (PAXIL) 20 MG tablet Take 1 tablet (20 mg total) by mouth daily. 30 tablet 1  . albuterol (VENTOLIN HFA) 108 (90 Base) MCG/ACT inhaler Inhale 2 puffs into the lungs every  6 (six) hours as needed for wheezing or shortness of breath. 8 g 0  . Ascorbic Acid (VITAMIN C PO) Take by mouth.    . benzonatate (TESSALON) 200 MG capsule Take 1 capsule (200 mg total) by mouth 2 (two) times daily as needed for cough. 20 capsule 0  . busPIRone (BUSPAR) 10 MG tablet Take 1 tablet (10 mg total) by mouth 3 (three) times daily. 90 tablet 1  . cholecalciferol (VITAMIN D) 1000 UNITS tablet Take 1,000 Units by mouth daily.    Jamie [name] (EUCRISA) 2 % OINT Apply 1 application topically 2 (two) times daily as needed. 60 g 6  . dextromethorphan 15 MG/5ML syrup Take 10 mLs (30 mg total) by mouth 4 (four) times daily as needed for cough. (Patient not taking: Reported on 07/18/2020) 120 mL 0  . fluticasone (FLONASE) 50 MCG/ACT nasal spray Place 1 spray into both nostrils daily as needed for allergies.     Marland Kitchen gabapentin (NEURONTIN) 600 MG tablet Take 600 mg by mouth 3 (three) times daily.     . halobetasol (ULTRAVATE) 0.05 % cream Apply topically 2 (two) times daily as needed. Up to 2 weeks. Avoid face, groin, underarms 50 g 2  .  Halobetasol Prop-Tazarotene (DUOBRII) 0.01-0.045 % LOTN Apply 1 application topically at bedtime. qhs to aa rash on hands and feet until clear, then prn flares, avoid face, groin, axilla 100 g 1  . meloxicam (MOBIC) 7.5 MG tablet Take 7.5 mg by mouth 2 (two) times daily.    . metaxalone (SKELAXIN) 800 MG tablet Take 800 mg by mouth 3 (three) times daily.    . montelukast (SINGULAIR) 10 MG tablet Take 1 tablet (10 mg total) by mouth at bedtime. 90 tablet 3  . morphine (KADIAN) 30 MG 24 hr capsule Take 30 mg by mouth daily.     Marland Kitchen morphine (MS CONTIN) 30 MG 12 hr tablet Take 30 mg by mouth every morning.     . Multiple Vitamin (MULTIVITAMIN ADULT PO) Take by mouth daily.    . Multiple Vitamins-Minerals (ZINC PO) Take by mouth.    . oxyCODONE (ROXICODONE) 15 MG immediate release tablet Take 15 mg by mouth every 6 (six) hours as needed.   0  . propranolol (INDERAL) 20 MG tablet Take 1 tablet (20 mg total) by mouth 2 (two) times daily as needed. For severe panic attacks 60 tablet 1  . QUEtiapine (SEROQUEL) 50 MG tablet Take 1 tablet (50 mg total) by mouth at bedtime. 90 tablet 1  . SUPREP BOWEL PREP KIT 17.5-3.13-1.6 GM/177ML SOLN Take by mouth as directed.     . triamcinolone cream (KENALOG) 0.1 % Apply 1 application topically 2 (two) times daily. 60 g 6   No current facility-administered medications for this visit.     Musculoskeletal: Strength & Muscle Tone: UTA Gait & Station: UTA Patient leans: N/A  Psychiatric Specialty Exam: Review of Systems  HENT: Positive for congestion and rhinorrhea.   Psychiatric/Behavioral: Positive for sleep disturbance. Negative for agitation, behavioral problems, confusion, decreased concentration, dysphoric mood, hallucinations, self-injury and suicidal ideas. The patient is nervous/anxious. The patient is not hyperactive.   All other systems reviewed and are negative.   Last menstrual period 06/20/2016.There is no height or weight on file to calculate BMI.   General Appearance: UTA  Eye Contact:  UTA  Speech:  Clear and Coherent  Volume:  Normal  Mood:  Anxious  Affect:  UTA  Thought Process:  Goal Directed and Descriptions of  Associations: Intact  Orientation:  Full (Time, Place, and Person)  Thought Content: Logical   Suicidal Thoughts:  No  Homicidal Thoughts:  No  Memory:  Immediate;   Fair Recent;   Fair Remote;   Fair  Judgement:  Fair  Insight:  Fair  Psychomotor Activity:  UTA  Concentration:  Concentration: Good and Attention Span: Fair  Recall:  [employer] of Knowledge: Fair  Language: Fair  Akathisia:  No  Handed:  Right  AIMS (if indicated): UTA  Assets:  Communication Skills Desire for Improvement Housing Social Support  ADL's:  Intact  Cognition: WNL  Sleep:  Restless   Screenings: GAD-7   Flowsheet Row Video Visit from 09/02/2020 in [facility] Video Visit from 04/26/2020 in [facility] Office Visit from 04/04/2020 in [facility] Video Visit from 01/26/2020 in [facility] Video Visit from 11/04/2019 in [facility]  Total GAD-7 Score _0 PHQ2-9   Flowsheet Row Video Visit from 09/02/2020 in [facility] Video Visit from 04/26/2020 in [facility] Office Visit from 04/04/2020 in [facility] Video Visit from 01/26/2020 in [facility] Video Visit from 11/04/2019 in [facility]  PHQ-2 Total Score _1 PHQ-9 Total Score _2 Assessment and Plan: Jamie [name] is a 50 year old Caucasian female, divorced, employed, lives in [place], has a history of MDD, GAD, panic attacks, chronic pain was evaluated by telemedicine today.  Patient with psychosocial stressors of work-related stressors, pandemic, her own health issues, going through a divorce.  Patient continues to struggle with anxiety symptoms although  depressive symptoms are improving.  Discussed plan as noted below.  Plan MDD- improving Paxil as prescribed BuSpar at reduced dose of 10 mg p.o. 3 times daily.  Dosage reduced due to bruxism. Seroquel 50 mg p.o. nightly as needed for sleep.  Patient wants to be awake to be able to help her elderly parents at night and does not want changes with her sleep medication. Continue CBT with Ms. Zadie Rhine.  Patient has been noncompliant, encouraged her to schedule an appointment. PHQ-9 equals 11  GAD- unstable Increase Paxil to 20 mg p.o. daily BuSpar as prescribed Propranolol 20 mg p.o. twice daily as needed for anxiety attacks Continue CBT GAD 7 equals 13  Panic attacks -stable Continue CBT Propranolol as needed  Follow-up in clinic in 3 weeks or sooner if needed.  I have spent atleast 20 minutes non face to face with patient today. More than 50 % of the time was spent for preparing to see the patient ( e.g., review of test, records ), ordering medications and test ,psychoeducation and supportive psychotherapy and care coordination,as well as documenting clinical information in electronic health record. This note was generated in part or whole with voice recognition software. Voice recognition is usually quite accurate but there are transcription errors that can and very often do occur. I apologize for any typographical errors that were not detected and corrected.      Ursula Alert, MD 09/02/2020, 10:07 AM

## 2020-09-08 ENCOUNTER — Other Ambulatory Visit: Payer: Self-pay | Admitting: Neurosurgery

## 2020-09-23 ENCOUNTER — Other Ambulatory Visit: Payer: Self-pay | Admitting: Psychiatry

## 2020-09-23 ENCOUNTER — Encounter: Payer: Self-pay | Admitting: Psychiatry

## 2020-09-23 ENCOUNTER — Other Ambulatory Visit: Payer: Self-pay

## 2020-09-23 ENCOUNTER — Telehealth (INDEPENDENT_AMBULATORY_CARE_PROVIDER_SITE_OTHER): Payer: 59 | Admitting: Psychiatry

## 2020-09-23 DIAGNOSIS — F411 Generalized anxiety disorder: Secondary | ICD-10-CM | POA: Diagnosis not present

## 2020-09-23 DIAGNOSIS — F3342 Major depressive disorder, recurrent, in full remission: Secondary | ICD-10-CM

## 2020-09-23 DIAGNOSIS — F410 Panic disorder [episodic paroxysmal anxiety] without agoraphobia: Secondary | ICD-10-CM

## 2020-09-23 MED ORDER — BUSPIRONE HCL 10 MG PO TABS: 10.0000 mg | ORAL_TABLET | Freq: Three times a day (TID) | ORAL | 1 refills | Status: DC

## 2020-09-23 NOTE — Progress Notes (Signed)
Virtual Visit via Video Note  I connected with Jamie [name] on 09/23/20 at 11:25 AM EST by a video enabled telemedicine application and verified that I am speaking with the correct person using two identifiers. Location Provider Location : [facility] Patient Location : Home  Participants: Patient , Provider   I discussed the limitations of evaluation and management by telemedicine and the availability of in person appointments. The patient expressed understanding and agreed to proceed. I discussed the assessment and treatment plan with the patient. The patient was provided an opportunity to ask questions and all were answered. The patient agreed with the plan and demonstrated an understanding of the instructions. The patient was advised to call back or seek an in-person evaluation if the symptoms worsen or if the condition fails to improve as anticipated.  [facility] MD OP Progress Note  09/23/2020 12:34 PM Jamie [name]  MRN:  [id number]  Chief Complaint:  Chief Complaint    Follow-up     HPI: Jamie [name] is a 50 year old Caucasian female, employed, divorced, lives in [place], has a history of MDD, GAD, panic attacks, chronic pain was evaluated by telemedicine today.  Patient today reports she is currently at her new job.  She reports she enjoys this job and it has been 3 weeks since she started it and it is going well.  She reports her anxiety symptoms as better since being at this new job position.  She reports her work atmosphere is very different from what she was used to at her previous job.  That definitely helps.  Patient reports she continues to be anxious, nervous and restless,and has trouble relaxing often.  She however reports in spite of that it does not affect her work.  She is compliant on the Paxil and that helps.  Patient denies any depressive symptoms.  She denies any sadness or crying spells.  She reports the Paxil is helpful for her depressive symptoms.  She takes the Paxil at  bedtime and that helps her to relax and helps her to fall asleep.  She reports even though she wakes up in between she is able to fall back asleep quicker than before.  She has been noncompliant on her Seroquel and does not want to take it anymore since the Paxil helps her to sleep.  Patient denies any suicidality, homicidality or perceptual disturbances.  Patient has been noncompliant with psychotherapy sessions however agrees to get in touch with her therapist.  Patient denies any other concerns today.  Visit Diagnosis:    ICD-10-CM   1. MDD (major depressive disorder), recurrent, in full remission ([place])  F33.42 busPIRone (BUSPAR) 10 MG tablet  2. GAD (generalized anxiety disorder)  F41.1 busPIRone (BUSPAR) 10 MG tablet  3. Panic attack  F41.0     Past Psychiatric History: I have reviewed past psychiatric history from my progress note on 04/26/2020.  Past trials of Trintellix-expensive, Seroquel, Abilify, Effexor, sertraline, Cymbalta-bruxism, Belsomra, trazodone, Ambien, Klonopin  Past Medical History:  Past Medical History:  Diagnosis Date  . Anxiety    takes Ativan daily.  Panic Attack  . Arthritis   . Chronic back pain    herniated disc/stenosis/scoliosis  . Depression    takes Effexor daily  . Eczema    uses a cream daily as needed  . Family history of adverse reaction to anesthesia    pta dad is very hard to wake up;excessive nausea  . History of bronchitis    > 30yrago  . Hx of dysplastic  nevus 05/10/2020   neck - posterior, Severe Atypia. Shave removal 07/18/2020, margins free  . Hypoglycemia   . Insomnia    takes Lorazepam nightly  . Joint pain   . PONV (postoperative nausea and vomiting)   . Seasonal allergies    uses Flonase daily  . Vitamin D deficiency    takes Vit D daily    Past Surgical History:  Procedure Laterality Date  . BACK SURGERY  2008/2012   laminectomy 1st time and 2nd time laminectomy and fusion  . COLONOSCOPY WITH PROPOFOL N/A 05/27/2020    Procedure: COLONOSCOPY WITH PROPOFOL;  Surgeon: Pasty Spillers, MD;  Location: [facility] ENDOSCOPY;  Service: Gastroenterology;  Laterality: N/A;  COVID POSITIVE AUGUST 16  . MAXIMUM ACCESS (MAS)POSTERIOR LUMBAR INTERBODY FUSION (PLIF) 2 LEVEL N/A 09/01/2014   Procedure: Lumbar four-five, Lumbar five-Sacral one Maximum Access Surgery posterior lumbar interbody fusion with interbody prosthesis posterior lateral arthrodesis posterior segmental instrumentation;  Surgeon: Mariam Dollar, MD;  Location: [facility];  Service: Neurosurgery;  Laterality: N/A;  Lumbar four-five, Lumbar five-Sacral one Maximum Access Surgery posterior lumbar interbody fusion with i    Family Psychiatric History: I have reviewed family psychiatric history from my progress note on 04/26/2020  Family History:  Family History  Problem Relation Age of Onset  . Arthritis Mother   . Hyperlipidemia Mother   . Migraines Mother   . Arthritis Father   . Diabetes Father   . Heart disease Father   . Diabetes Brother   . Breast cancer Maternal Grandmother   . Mental illness Neg Hx     Social History: Reviewed social history from my progress note on 04/26/2020 Social History   Socioeconomic History  . Marital status: Divorced    Spouse name: Not on file  . Number of children: Not on file  . Years of education: Not on file  . Highest education level: Bachelor's degree (e.g., BA, AB, BS)  Occupational History  . Not on file  Tobacco Use  . Smoking status: Former Smoker    Packs/day: 0.50    Years: 20.00    Pack years: 10.00    Quit date: 10/18/2016    Years since quitting: 3.9  . Smokeless tobacco: Never Used  Vaping Use  . Vaping Use: Never used  Substance and Sexual Activity  . Alcohol use: No  . Drug use: No  . Sexual activity: Not Currently  Other Topics Concern  . Not on file  Social History Narrative  . Not on file   Social Determinants of Health   Financial Resource Strain: Not on file  Food Insecurity:  Not on file  Transportation Needs: Not on file  Physical Activity: Not on file  Stress: Not on file  Social Connections: Not on file    Allergies:  Allergies  Allergen Reactions  . Shellfish Allergy Hives  . Flexeril [Cyclobenzaprine] Hives    RAPID HEARTBEAT    Metabolic Disorder Labs: No results found for: HGBA1C, MPG No results found for: PROLACTIN Lab Results  Component Value Date   CHOL 193 04/04/2020   TRIG 54 04/04/2020   HDL 59 04/04/2020   CHOLHDL 3.6 10/16/2016   LDLCALC 124 (H) 04/04/2020   LDLCALC 107 (H) 02/06/2018   Lab Results  Component Value Date   TSH 1.560 04/04/2020   TSH 1.290 02/06/2018    Therapeutic Level Labs: No results found for: LITHIUM No results found for: VALPROATE No components found for:  CBMZ  Current Medications: Current  Outpatient Medications  Medication Sig Dispense Refill  . albuterol (VENTOLIN HFA) 108 (90 Base) MCG/ACT inhaler Inhale 2 puffs into the lungs every 6 (six) hours as needed for wheezing or shortness of breath. 8 g 0  . Ascorbic Acid (VITAMIN C PO) Take by mouth.    . benzonatate (TESSALON) 200 MG capsule Take 1 capsule (200 mg total) by mouth 2 (two) times daily as needed for cough. 20 capsule 0  . busPIRone (BUSPAR) 10 MG tablet Take 1 tablet (10 mg total) by mouth 3 (three) times daily. 90 tablet 1  . cholecalciferol (VITAMIN D) 1000 UNITS tablet Take 1,000 Units by mouth daily.    Jamie [name] (EUCRISA) 2 % OINT Apply 1 application topically 2 (two) times daily as needed. 60 g 6  . dextromethorphan 15 MG/5ML syrup Take 10 mLs (30 mg total) by mouth 4 (four) times daily as needed for cough. (Patient not taking: Reported on 07/18/2020) 120 mL 0  . fluticasone (FLONASE) 50 MCG/ACT nasal spray Place 1 spray into both nostrils daily as needed for allergies.     Marland Kitchen gabapentin (NEURONTIN) 600 MG tablet Take 600 mg by mouth 3 (three) times daily.     . halobetasol (ULTRAVATE) 0.05 % cream Apply topically 2 (two) times  daily as needed. Up to 2 weeks. Avoid face, groin, underarms 50 g 2  . Halobetasol Prop-Tazarotene (DUOBRII) 0.01-0.045 % LOTN Apply 1 application topically at bedtime. qhs to aa rash on hands and feet until clear, then prn flares, avoid face, groin, axilla 100 g 1  . meloxicam (MOBIC) 7.5 MG tablet Take 7.5 mg by mouth 2 (two) times daily.    . metaxalone (SKELAXIN) 800 MG tablet Take 800 mg by mouth 3 (three) times daily.    . montelukast (SINGULAIR) 10 MG tablet Take 1 tablet (10 mg total) by mouth at bedtime. 90 tablet 3  . morphine (KADIAN) 30 MG 24 hr capsule Take 30 mg by mouth daily.     Marland Kitchen morphine (MS CONTIN) 30 MG 12 hr tablet Take 30 mg by mouth every morning.     . Multiple Vitamin (MULTIVITAMIN ADULT PO) Take by mouth daily.    . Multiple Vitamins-Minerals (ZINC PO) Take by mouth.    . oxyCODONE (ROXICODONE) 15 MG immediate release tablet Take 15 mg by mouth every 6 (six) hours as needed.   0  . PARoxetine (PAXIL) 20 MG tablet Take 1 tablet (20 mg total) by mouth daily. 30 tablet 1  . PFIZER-BIONTECH COVID-19 VACC 30 MCG/0.3ML injection     . propranolol (INDERAL) 20 MG tablet Take 1 tablet (20 mg total) by mouth 2 (two) times daily as needed. For severe panic attacks 60 tablet 1  . SUPREP BOWEL PREP KIT 17.5-3.13-1.6 GM/177ML SOLN Take by mouth as directed.     . triamcinolone cream (KENALOG) 0.1 % Apply 1 application topically 2 (two) times daily. 60 g 6   No current facility-administered medications for this visit.     Musculoskeletal: Strength & Muscle Tone: UTA Gait & Station: UTA Patient leans: N/A  Psychiatric Specialty Exam: Review of Systems  Psychiatric/Behavioral: The patient is nervous/anxious (improving).   All other systems reviewed and are negative.   Last menstrual period 06/20/2016.There is no height or weight on file to calculate BMI.  General Appearance: Casual  Eye Contact:  Fair  Speech:  Clear and Coherent  Volume:  Normal  Mood:  Anxious  improving  Affect:  Congruent  Thought Process:  Goal  Directed and Descriptions of Associations: Intact  Orientation:  Full (Time, Place, and Person)  Thought Content: Logical   Suicidal Thoughts:  No  Homicidal Thoughts:  No  Memory:  Immediate;   Fair Recent;   Fair Remote;   Fair  Judgement:  Fair  Insight:  Fair  Psychomotor Activity:  Normal  Concentration:  Concentration: Fair and Attention Span: Fair  Recall:  [employer] of Knowledge: Fair  Language: Fair  Akathisia:  No  Handed:  Right  AIMS (if indicated): UTA  Assets:  Communication Skills Desire for Improvement Housing Social Support  ADL's:  Intact  Cognition: WNL  Sleep:  Fair   Screenings: GAD-7   Flowsheet Row Video Visit from 09/23/2020 in [place] Video Visit from 09/02/2020 in [place] Video Visit from 04/26/2020 in [place] Office Visit from 04/04/2020 in [facility] Video Visit from 01/26/2020 in [place]  Total GAD-7 Score _0 PHQ2-9   Flowsheet Row Video Visit from 09/23/2020 in [place] Video Visit from 09/02/2020 in [place] Video Visit from 04/26/2020 in [place] Office Visit from 04/04/2020 in [facility] Video Visit from 01/26/2020 in [place]  PHQ-2 Total Score _1 PHQ-9 Total Score _2 Assessment and Plan: Janith Nielson is a 50 year old Caucasian female, divorced, employed, lives in [place], has a history of MDD, GAD, panic attacks, chronic pain was evaluated by telemedicine today.  Patient with psychosocial stressors of her own health issues, going through a divorce.  Patient is currently at her new job and reports situational stressors as improved and medications as beneficial.  Plan as noted below.  Plan MDD-in remission PHQ  9 today equals 7 Paxil 20 mg p.o. daily Continue BuSpar at reduced dosage of 10 mg p.o. 3 times daily.  Dosage reduced due to bruxism. Discontinue Seroquel for noncompliance.  GAD-improving GAD 7 equals 13 Continue Paxil 20 mg p.o. daily Patient advised to restart psychotherapy sessions with Jamie [name] has been noncompliant  Panic attacks-stable Continue CBT Propranolol as needed  Follow-up in clinic in 1 month or sooner if needed.  I have spent atleast 20 minutes face to face by video with patient today. More than 50 % of the time was spent for preparing to see the patient ( e.g., review of test, records ),  ordering medications and test ,psychoeducation and supportive psychotherapy and care coordination,as well as documenting clinical information in electronic health record. This note was generated in part or whole with voice recognition software. Voice recognition is usually quite accurate but there are transcription errors that can and very often do occur. I apologize for any typographical errors that were not detected and corrected.     Ursula Alert, MD 09/23/2020, 12:34 PM

## 2020-09-28 DIAGNOSIS — M25432 Effusion, left wrist: Secondary | ICD-10-CM | POA: Diagnosis not present

## 2020-09-28 DIAGNOSIS — M960 Pseudarthrosis after fusion or arthrodesis: Secondary | ICD-10-CM | POA: Diagnosis not present

## 2020-09-28 DIAGNOSIS — M5441 Lumbago with sciatica, right side: Secondary | ICD-10-CM | POA: Diagnosis not present

## 2020-09-28 DIAGNOSIS — M19032 Primary osteoarthritis, left wrist: Secondary | ICD-10-CM | POA: Diagnosis not present

## 2020-09-28 DIAGNOSIS — G8929 Other chronic pain: Secondary | ICD-10-CM | POA: Diagnosis not present

## 2020-09-28 DIAGNOSIS — M67432 Ganglion, left wrist: Secondary | ICD-10-CM | POA: Diagnosis not present

## 2020-09-28 DIAGNOSIS — M25532 Pain in left wrist: Secondary | ICD-10-CM | POA: Diagnosis not present

## 2020-09-29 ENCOUNTER — Other Ambulatory Visit: Payer: Self-pay | Admitting: Neurosurgery

## 2020-09-29 DIAGNOSIS — G8929 Other chronic pain: Secondary | ICD-10-CM

## 2020-09-29 DIAGNOSIS — M960 Pseudarthrosis after fusion or arthrodesis: Secondary | ICD-10-CM

## 2020-10-10 DIAGNOSIS — M5441 Lumbago with sciatica, right side: Secondary | ICD-10-CM | POA: Diagnosis not present

## 2020-10-10 DIAGNOSIS — G8929 Other chronic pain: Secondary | ICD-10-CM | POA: Diagnosis not present

## 2020-10-19 ENCOUNTER — Other Ambulatory Visit: Payer: Self-pay | Admitting: Psychiatry

## 2020-10-19 ENCOUNTER — Encounter: Payer: Self-pay | Admitting: Psychiatry

## 2020-10-19 ENCOUNTER — Telehealth (INDEPENDENT_AMBULATORY_CARE_PROVIDER_SITE_OTHER): Payer: 59 | Admitting: Psychiatry

## 2020-10-19 ENCOUNTER — Other Ambulatory Visit: Payer: Self-pay

## 2020-10-19 DIAGNOSIS — F3342 Major depressive disorder, recurrent, in full remission: Secondary | ICD-10-CM

## 2020-10-19 DIAGNOSIS — F411 Generalized anxiety disorder: Secondary | ICD-10-CM

## 2020-10-19 DIAGNOSIS — F332 Major depressive disorder, recurrent severe without psychotic features: Secondary | ICD-10-CM | POA: Insufficient documentation

## 2020-10-19 DIAGNOSIS — F410 Panic disorder [episodic paroxysmal anxiety] without agoraphobia: Secondary | ICD-10-CM

## 2020-10-19 MED ORDER — PAROXETINE HCL 20 MG PO TABS: 30.0000 mg | ORAL_TABLET | Freq: Every day | ORAL | 1 refills | Status: DC

## 2020-10-19 NOTE — Progress Notes (Signed)
Virtual Visit via Video Note  I connected with Jamie [name] on 10/19/20 at  8:30 AM EST by a video enabled telemedicine application and verified that I am speaking with the correct person using two identifiers.  Location Provider Location : [facility] Patient Location : Car  Participants: Patient , Provider   I discussed the limitations of evaluation and management by telemedicine and the availability of in person appointments. The patient expressed understanding and agreed to proceed.    I discussed the assessment and treatment plan with the patient. The patient was provided an opportunity to ask questions and all were answered. The patient agreed with the plan and demonstrated an understanding of the instructions.   The patient was advised to call back or seek an in-person evaluation if the symptoms worsen or if the condition fails to improve as anticipated.   [facility] MD OP Progress Note  10/19/2020 8:54 AM Jamie [name]  MRN:  [id number]  Chief Complaint:  Chief Complaint    Follow-up     HPI: Jamie [name] is a 50 year old Caucasian female, employed, divorced, lives in [place], has a history of MDD, panic attacks, GAD, chronic pain was evaluated by telemedicine today.  Patient today reports she is enjoying her work.  She reports her work environment is very supportive and that helps her a lot.  She reports she however got a message from her husband with whom she has been separated since 2015 that he wants the divorce finalized.  She reports even though she saw it coming she is still grieving the loss of that relationship.  Patient became tearful when she discussed it.  She reports she will continue to work with her therapist on the same.  Patient does report sadness due to going through divorce.  She however reports she has been coping okay.  She does have anxiety symptoms on and off.  She reports she often worries about different things and has trouble relaxing.  The Paxil does help her a  lot.  It also helps her to sleep.  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  She however is going to get a mouthguard soon for her bruxism and is hoping that will help.  Patient denies any suicidality, homicidality or perceptual disturbances.  Patient denies any other concerns today.  Visit Diagnosis:    ICD-10-CM   1. MDD (major depressive disorder), recurrent, in full remission ([place])  F33.42   2. GAD (generalized anxiety disorder)  F41.1 PARoxetine (PAXIL) 20 MG tablet  3. Panic attack  F41.0 PARoxetine (PAXIL) 20 MG tablet    Past Psychiatric History: I have reviewed past psychiatric history from my progress note on 04/26/2020.  Past also Trintellix-expensive, Seroquel, Abilify, Effexor, sertraline, Cymbalta-bruxism, Belsomra, trazodone, Ambien, Klonopin  Past Medical History:  Past Medical History:  Diagnosis Date   Anxiety    takes Ativan daily.  Panic Attack   Arthritis    Chronic back pain    herniated disc/stenosis/scoliosis   Depression    takes Effexor daily   Eczema    uses a cream daily as needed   Family history of adverse reaction to anesthesia    pta dad is very hard to wake up;excessive nausea   History of bronchitis    > 79yrago   Hx of dysplastic nevus 05/10/2020  neck - posterior, Severe Atypia. Shave removal 07/18/2020, margins free   Hypoglycemia    Insomnia    takes Lorazepam nightly   Joint pain    PONV (postoperative nausea and vomiting)    Seasonal allergies    uses Flonase daily   Vitamin D deficiency    takes Vit D daily    Past Surgical History:  Procedure Laterality Date   BACK SURGERY  2008/2012   laminectomy 1st time and 2nd time laminectomy and fusion   COLONOSCOPY WITH PROPOFOL N/A 05/27/2020   Procedure: COLONOSCOPY WITH PROPOFOL;  Surgeon: Virgel Manifold, MD;  Location: [facility] ENDOSCOPY;  Service: Gastroenterology;  Laterality: N/A;  COVID POSITIVE AUGUST 16   MAXIMUM ACCESS (MAS)POSTERIOR LUMBAR INTERBODY FUSION (PLIF) 2 LEVEL N/A 09/01/2014   Procedure: Lumbar four-five, Lumbar five-Sacral one Maximum Access Surgery posterior lumbar interbody fusion with interbody prosthesis posterior lateral arthrodesis posterior segmental instrumentation;  Surgeon: Elaina Hoops, MD;  Location: [facility];  Service: Neurosurgery;  Laterality: N/A;  Lumbar four-five, Lumbar five-Sacral one Maximum Access Surgery posterior lumbar interbody fusion with i    Family Psychiatric History: I have reviewed family psychiatric history from my progress note on 04/26/2020  Family History:  Family History  Problem Relation Age of Onset   Arthritis Mother    Hyperlipidemia Mother    Migraines Mother    Arthritis Father    Diabetes Father    Heart disease Father    Diabetes Brother    Breast cancer Maternal Grandmother    Mental illness Neg Hx     Social History: Reviewed social history from my progress note on 04/26/2020 Social History   Socioeconomic History   Marital status: Divorced    Spouse name: Not on file   Number of children: Not on file   Years of education: Not on file   Highest education level: Bachelor's degree (e.g., BA, AB, BS)  Occupational History   Not on file  Tobacco Use   Smoking status: Former Smoker    Packs/day: 0.50    Years: 20.00    Pack years: 10.00    Quit date: 10/18/2016    Years since quitting: 4.0   Smokeless tobacco: Never Used  Vaping Use   Vaping Use: Never used  Substance and Sexual Activity   Alcohol use: No   Drug use: No   Sexual activity: Not Currently  Other Topics Concern   Not on file  Social History Narrative   Not on file   Social Determinants of Health   Financial Resource Strain: Not on file  Food Insecurity: Not on file  Transportation Needs: Not on file  Physical  Activity: Not on file  Stress: Not on file  Social Connections: Not on file    Allergies:  Allergies  Allergen Reactions   Shellfish Allergy Hives   Flexeril [Cyclobenzaprine] Hives    RAPID HEARTBEAT    Metabolic Disorder Labs: No results found for: HGBA1C, MPG No results found for: PROLACTIN Lab Results  Component Value Date   CHOL 193 04/04/2020   TRIG 54 04/04/2020   HDL 59 04/04/2020   CHOLHDL 3.6 10/16/2016   LDLCALC 124 (H) 04/04/2020   LDLCALC 107 (H) 02/06/2018   Lab Results  Component Value Date   TSH 1.560 04/04/2020   TSH 1.290 02/06/2018    Therapeutic Level Labs: No results found for: LITHIUM No results found for: VALPROATE No components found for:  CBMZ  Current Medications: Current Outpatient Medications  Medication  Sig Dispense Refill   albuterol (VENTOLIN HFA) 108 (90 Base) MCG/ACT inhaler Inhale 2 puffs into the lungs every 6 (six) hours as needed for wheezing or shortness of breath. 8 g 0   Ascorbic Acid (VITAMIN C PO) Take by mouth.     benzonatate (TESSALON) 200 MG capsule Take 1 capsule (200 mg total) by mouth 2 (two) times daily as needed for cough. 20 capsule 0   busPIRone (BUSPAR) 10 MG tablet Take 1 tablet (10 mg total) by mouth 3 (three) times daily. 90 tablet 1   cholecalciferol (VITAMIN D) 1000 UNITS tablet Take 1,000 Units by mouth daily.     Crisaborole (EUCRISA) 2 % OINT Apply 1 application topically 2 (two) times daily as needed. 60 g 6   dextromethorphan 15 MG/5ML syrup Take 10 mLs (30 mg total) by mouth 4 (four) times daily as needed for cough. (Patient not taking: Reported on 07/18/2020) 120 mL 0   fluticasone (FLONASE) 50 MCG/ACT nasal spray Place 1 spray into both nostrils daily as needed for allergies.      gabapentin (NEURONTIN) 600 MG tablet Take 600 mg by mouth 3 (three) times daily.      halobetasol (ULTRAVATE) 0.05 % cream Apply topically 2 (two) times daily as needed. Up to 2 weeks. Avoid face, groin, underarms  50 g 2   Halobetasol Prop-Tazarotene (DUOBRII) 0.01-0.045 % LOTN Apply 1 application topically at bedtime. qhs to aa rash on hands and feet until clear, then prn flares, avoid face, groin, axilla 100 g 1   meloxicam (MOBIC) 7.5 MG tablet Take 7.5 mg by mouth 2 (two) times daily.     metaxalone (SKELAXIN) 800 MG tablet Take 800 mg by mouth 3 (three) times daily.     montelukast (SINGULAIR) 10 MG tablet Take 1 tablet (10 mg total) by mouth at bedtime. 90 tablet 3   morphine (KADIAN) 30 MG 24 hr capsule Take 30 mg by mouth daily.      morphine (MS CONTIN) 30 MG 12 hr tablet Take 30 mg by mouth every morning.      Multiple Vitamin (MULTIVITAMIN ADULT PO) Take by mouth daily.     Multiple Vitamins-Minerals (ZINC PO) Take by mouth.     oxyCODONE (ROXICODONE) 15 MG immediate release tablet Take 15 mg by mouth every 6 (six) hours as needed.   0   PARoxetine (PAXIL) 20 MG tablet Take 1.5 tablets (30 mg total) by mouth daily. 45 tablet 1   PFIZER-BIONTECH COVID-19 VACC 30 MCG/0.3ML injection      propranolol (INDERAL) 20 MG tablet Take 1 tablet (20 mg total) by mouth 2 (two) times daily as needed. For severe panic attacks 60 tablet 1   SUPREP BOWEL PREP KIT 17.5-3.13-1.6 GM/177ML SOLN Take by mouth as directed.      triamcinolone cream (KENALOG) 0.1 % Apply 1 application topically 2 (two) times daily. 60 g 6   No current facility-administered medications for this visit.     Musculoskeletal: Strength & Muscle Tone: UTA Gait & Station: UTA Patient leans: N/A  Psychiatric Specialty Exam: Review of Systems  Psychiatric/Behavioral: Positive for sleep disturbance. The patient is nervous/anxious.   All other systems reviewed and are negative.   Last menstrual period 06/20/2016.There is no height or weight on file to calculate BMI.  General Appearance: Casual  Eye Contact:  Fair  Speech:  Clear and Coherent  Volume:  Normal  Mood:  Anxious  Affect:  Congruent  Thought Process:  Goal  Directed and  Descriptions of Associations: Intact  Orientation:  Full (Time, Place, and Person)  Thought Content: Logical   Suicidal Thoughts:  No  Homicidal Thoughts:  No  Memory:  Immediate;   Fair Recent;   Fair Remote;   Fair  Judgement:  Fair  Insight:  Fair  Psychomotor Activity:  Normal  Concentration:  Concentration: Fair and Attention Span: Fair  Recall:  [employer] of Knowledge: Fair  Language: Fair  Akathisia:  No  Handed:  Right  AIMS (if indicated): UTA  Assets:  Communication Skills Desire for [place] Talents/Skills Transportation Vocational/Educational  ADL's:  Intact  Cognition: WNL  Sleep:  Restless   Screenings: GAD-7   Flowsheet Row Video Visit from 09/23/2020 in [facility] Video Visit from 09/02/2020 in [facility] Video Visit from 04/26/2020 in [facility] Office Visit from 04/04/2020 in [facility] Video Visit from 01/26/2020 in [facility]  Total GAD-7 Score _0 PHQ2-9   Flowsheet Row Video Visit from 10/19/2020 in [facility] Video Visit from 09/23/2020 in [facility] Video Visit from 09/02/2020 in [facility] Video Visit from 04/26/2020 in [facility] Office Visit from 04/04/2020 in [facility]  PHQ-2 Total Score _1 PHQ-9 Total Score -- _2 Flowsheet Row Video Visit from 10/19/2020 in [facility] No Risk       Assessment and Plan: Madylin Fairbank is a 50 year old Caucasian female, divorced, employed, lives in [place], has a history of MDD, GAD, panic attacks, chronic pain was evaluated by telemedicine today.  Patient with psychosocial stressors of going through divorce, parents with health problems, her own health issues.   Patient however is currently making progress however will continue to benefit from medication readjustment and psychotherapy sessions.  Plan MDD in remission Paxil as prescribed BuSpar at reduced dose of 10 mg p.o. 3 times daily.  Dose reduced due to bruxism.  GAD-improving Increase Paxil to 30 mg p.o. daily Continue psychotherapy sessions.   Panic attacks-stable Continue CBT Propranolol as needed for severe panic symptoms  Encouraged patient to have more frequent psychotherapy sessions since she is currently going through the stress of divorce.  Follow-up in clinic in 3 to 4 weeks or sooner if needed.   This note was generated in part or whole with voice recognition software. Voice recognition is usually quite accurate but there are transcription errors that can and very often do occur. I apologize for any typographical errors that were not detected and corrected.        Ursula Alert, MD 10/19/2020, 8:54 AM

## 2020-10-25 DIAGNOSIS — M25532 Pain in left wrist: Secondary | ICD-10-CM | POA: Insufficient documentation

## 2020-10-25 DIAGNOSIS — M5136 Other intervertebral disc degeneration, lumbar region: Secondary | ICD-10-CM | POA: Diagnosis not present

## 2020-10-25 DIAGNOSIS — M961 Postlaminectomy syndrome, not elsewhere classified: Secondary | ICD-10-CM | POA: Diagnosis not present

## 2020-10-26 ENCOUNTER — Other Ambulatory Visit: Payer: Self-pay | Admitting: Neurosurgery

## 2020-10-31 ENCOUNTER — Ambulatory Visit: Payer: 59 | Admitting: Licensed Clinical Social Worker

## 2020-11-08 ENCOUNTER — Ambulatory Visit (INDEPENDENT_AMBULATORY_CARE_PROVIDER_SITE_OTHER): Payer: 59 | Admitting: Dermatology

## 2020-11-08 ENCOUNTER — Other Ambulatory Visit: Payer: Self-pay

## 2020-11-08 DIAGNOSIS — L409 Psoriasis, unspecified: Secondary | ICD-10-CM

## 2020-11-08 NOTE — Progress Notes (Signed)
   Follow-Up Visit   Subjective  Jamie [name] is a 50 y.o. female who presents for the following: Psoriasis (Patient here for 2 month follow-up palmar-plantar psoriasis. She is using Duobrii lotion, which has helped, but still not clear. She had a recent flare last week, making it hard to walk. She talked to her Psychiatrist about starting [place], but decided against it at this time. She has a history of depression and is doing well at this time.). She does have some joint pain in knees and ankles. In the past, she has tried [place] ointment and halobetasol propionate cream. No history of eczema or asthma. She does have seasonal allergies. No history of inflammatory bowel disease.   The following portions of the chart were reviewed this encounter and updated as appropriate:       Review of Systems:  No other skin or systemic complaints except as noted in HPI or Assessment and Plan.  Objective  Well appearing patient in no apparent distress; mood and affect are within normal limits.  A focused examination was performed including hands, feet. Relevant physical exam findings are noted in the Assessment and Plan.  Objective  palms, soles: Well-demarcated erythematous patches with hyperkeratosis and dried vesicles L heel, plantar, instep; diffuse erythema and hyperkeratosis with dried vesicles of bilateral palms. 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 Not clearing with topical Duobrii  Discussed Otezla.  Pt defers due to h/o depression and her psychiatrist prefers she not take it. Discussed biologics. Pending labs and insurance approval, will start Cosentyx injections.  Continue Duobrii lotion qhs palms and soles. Avoid face, groin, axilla.  Reviewed risks of biologics including immunosuppression, infections, injection site reaction, and failure to improve condition. Goal is control of skin  condition, not cure.  Some older biologics such as Humira and Enbrel may slightly increase risk of malignancy and may worsen congestive heart failure. The use of biologics requires long term medication management, including periodic office visits and monitoring of blood work.  Topical steroids (such as triamcinolone, fluocinolone, fluocinonide, mometasone, clobetasol, halobetasol, betamethasone, hydrocortisone) can cause thinning and lightening of the skin if they are used for too long in the same area. Your physician has selected the right strength medicine for your problem and area affected on the body. Please use your medication only as directed by your physician to prevent side effects.    Other Related Procedures Comprehensive metabolic panel CBC with Differential/Platelet Hepatitis B surface antibody,qualitative Hepatitis B surface antigen Hepatitis C antibody HIV Antibody (routine testing w rflx) QuantiFERON-TB Gold Plus  Other Related Medications Halobetasol Prop-Tazarotene (DUOBRII) 0.01-0.045 % LOTN  Return in about 1 month (around 12/09/2020) for psoriasis.   IJamesetta Orleans, CMA, am acting as scribe for [name], MD .  Documentation: I have reviewed the above documentation for accuracy and completeness, and I agree with the above.  [name] MD

## 2020-11-08 NOTE — Patient Instructions (Addendum)
If you have any questions or concerns for your doctor, please call our main line at [phone] and press option 4 to reach your doctor's medical assistant. If no one answers, please leave a voicemail as directed and we will return your call as soon as possible. Messages left after 4 pm will be answered the following business day.   You may also send us a message via MyChart. We typically respond to MyChart messages within 1-2 business days.  For prescription refills, please ask your pharmacy to contact our office. Our fax number is [phone].  If you have an urgent issue when the clinic is closed that cannot wait until the next business day, you can page your doctor at the number below.    Please note that while we do our best to be available for urgent issues outside of office hours, we are not available 24/7.   If you have an urgent issue and are unable to reach us, you may choose to seek medical care at your doctor's office, retail clinic, urgent care center, or emergency room.  If you have a medical emergency, please immediately call 911 or go to the emergency department.  Pager Numbers  - Dr. Kowalski: [phone]  - Dr. Moye: [phone]  - Dr. Stewart: [phone]  In the event of inclement weather, please call our main line at [phone] for an update on the status of any delays or closures.  Dermatology Medication Tips: Please keep the boxes that topical medications come in in order to help keep track of the instructions about where and how to use these. Pharmacies typically print the medication instructions only on the boxes and not directly on the medication tubes.   If your medication is too expensive, please contact our office at [phone] option 4 or send us a message through MyChart.   We are unable to tell what your co-pay for medications will be in advance as this is different depending on your insurance coverage. However, we may be able to find a substitute  medication at lower cost or fill out paperwork to get insurance to cover a needed medication.   If a prior authorization is required to get your medication covered by your insurance company, please allow us 1-2 business days to complete this process.  Drug prices often vary depending on where the prescription is filled and some pharmacies may offer cheaper prices.  The website www.goodrx.com contains coupons for medications through different pharmacies. The prices here do not account for what the cost may be with help from insurance (it may be cheaper with your insurance), but the website can give you the price if you did not use any insurance.  - You can print the associated coupon and take it with your prescription to the pharmacy.  - You may also stop by our office during regular business hours and pick up a GoodRx coupon card.  - If you need your prescription sent electronically to a different pharmacy, notify our office through [facility] MyChart or by phone at [phone] option 4.  Reviewed risks of biologics including immunosuppression, infections, injection site reaction, and failure to improve condition. Goal is control of skin condition, not cure.  Some older biologics such as Humira and Enbrel may slightly increase risk of malignancy and may worsen congestive heart failure. The use of biologics requires long term medication management, including periodic office visits and monitoring of blood work.  Topical steroids (such as triamcinolone, fluocinolone, fluocinonide, mometasone, clobetasol, halobetasol, betamethasone, hydrocortisone)   can cause thinning and lightening of the skin if they are used for too long in the same area. Your physician has selected the right strength medicine for your problem and area affected on the body. Please use your medication only as directed by your physician to prevent side effects.   

## 2020-11-09 ENCOUNTER — Ambulatory Visit: Payer: 59

## 2020-11-16 ENCOUNTER — Encounter: Payer: Self-pay | Admitting: Licensed Clinical Social Worker

## 2020-11-16 ENCOUNTER — Telehealth (INDEPENDENT_AMBULATORY_CARE_PROVIDER_SITE_OTHER): Payer: 59 | Admitting: Psychiatry

## 2020-11-16 ENCOUNTER — Other Ambulatory Visit: Payer: Self-pay

## 2020-11-16 ENCOUNTER — Encounter: Payer: Self-pay | Admitting: Psychiatry

## 2020-11-16 ENCOUNTER — Ambulatory Visit (INDEPENDENT_AMBULATORY_CARE_PROVIDER_SITE_OTHER): Payer: 59 | Admitting: Licensed Clinical Social Worker

## 2020-11-16 ENCOUNTER — Other Ambulatory Visit: Payer: Self-pay | Admitting: Psychiatry

## 2020-11-16 DIAGNOSIS — F410 Panic disorder [episodic paroxysmal anxiety] without agoraphobia: Secondary | ICD-10-CM

## 2020-11-16 DIAGNOSIS — F411 Generalized anxiety disorder: Secondary | ICD-10-CM | POA: Diagnosis not present

## 2020-11-16 DIAGNOSIS — F3342 Major depressive disorder, recurrent, in full remission: Secondary | ICD-10-CM

## 2020-11-16 MED ORDER — PROPRANOLOL HCL 20 MG PO TABS: 20.0000 mg | ORAL_TABLET | Freq: Two times a day (BID) | ORAL | 1 refills | Status: DC | PRN

## 2020-11-16 MED ORDER — BUSPIRONE HCL 10 MG PO TABS: 10.0000 mg | ORAL_TABLET | Freq: Three times a day (TID) | ORAL | 1 refills | Status: DC

## 2020-11-16 NOTE — Progress Notes (Signed)
Virtual Visit via Video Note  I connected with Jamie [name] on 11/16/20 at  8:30 AM EDT by a video enabled telemedicine application and verified that I am speaking with the correct person using two identifiers.  Location Provider Location : [facility] Patient Location : Car  Participants: Patient , Provider   I discussed the limitations of evaluation and management by telemedicine and the availability of in person appointments. The patient expressed understanding and agreed to proceed.   I discussed the assessment and treatment plan with the patient. The patient was provided an opportunity to ask questions and all were answered. The patient agreed with the plan and demonstrated an understanding of the instructions.   The patient was advised to call back or seek an in-person evaluation if the symptoms worsen or if the condition fails to improve as anticipated.   [facility] MD OP Progress Note  11/16/2020 1:52 PM Jamie [name]  MRN:  [id number]  Chief Complaint:  Chief Complaint    Follow-up; Anxiety; Depression     HPI: Jamie [name] is a 50 year old Caucasian female, employed, divorced, lives in [place], has a history of MDD, panic attacks, GAD, chronic pain was evaluated by telemedicine today.  Patient today reports she is currently making progress with regards to her anxiety symptoms on the higher dosage of Paxil.  She however reports she continues to worry a lot.  She reports she has chronic back pain and may need surgery soon.  She has upcoming appointment with neurosurgeon.  She currently rates her pain at a 6 out of 10, 10 being the worst.  She reports she worries about what will happen to her parents during her recovery period after surgery.  Patient also reports she does not know how her work is going to handle it if she asked for leave of absence since she is at this new job.  These are all genuine fears that she has.  She continues to follow-up with therapist and reports therapy sessions are  helpful.  She reports she is tolerating the Paxil well and her teeth grinding has not worsened.  She currently uses mouthguard.  She reports sleep is better with the Paxil.  She denies suicidality, homicidality or perceptual disturbances.  Patient denies any other concerns today.  Visit Diagnosis:    ICD-10-CM   1. MDD (major depressive disorder), recurrent, in full remission ([place])  F33.42 propranolol (INDERAL) 20 MG tablet  2. GAD (generalized anxiety disorder)  F41.1 busPIRone (BUSPAR) 10 MG tablet    propranolol (INDERAL) 20 MG tablet  3. Panic attack  F41.0 propranolol (INDERAL) 20 MG tablet    Past Psychiatric History: I have reviewed past psychiatric history from my progress note on 04/26/2020.  Past trials of Trintellix-expensive, Seroquel, Abilify, Effexor, sertraline, Cymbalta-bruxism, Belsomra, trazodone, Ambien, Klonopin  Past Medical History:  Past Medical History:  Diagnosis Date  . Anxiety    takes Ativan daily.  Panic Attack  . Arthritis   . Chronic back pain    herniated disc/stenosis/scoliosis  . Depression    takes Effexor daily  . Eczema    uses a cream daily as needed  . Family history of adverse reaction to anesthesia    pta dad is very hard to wake up;excessive nausea  . History of bronchitis    > 33yrago  . Hx of dysplastic nevus 05/10/2020   neck - posterior, Severe Atypia. Shave removal 07/18/2020, margins free  . Hypoglycemia   . Insomnia    takes Lorazepam nightly  .  Joint pain   . PONV (postoperative nausea and vomiting)   . Seasonal allergies    uses Flonase daily  . Vitamin D deficiency    takes Vit D daily    Past Surgical History:  Procedure Laterality Date  . BACK SURGERY  2008/2012   laminectomy 1st time and 2nd time laminectomy and fusion  . COLONOSCOPY WITH PROPOFOL N/A 05/27/2020   Procedure: COLONOSCOPY WITH PROPOFOL;  Surgeon: Virgel Manifold, MD;  Location: [facility] ENDOSCOPY;  Service: Gastroenterology;  Laterality: N/A;   COVID POSITIVE AUGUST 16  . MAXIMUM ACCESS (MAS)POSTERIOR LUMBAR INTERBODY FUSION (PLIF) 2 LEVEL N/A 09/01/2014   Procedure: Lumbar four-five, Lumbar five-Sacral one Maximum Access Surgery posterior lumbar interbody fusion with interbody prosthesis posterior lateral arthrodesis posterior segmental instrumentation;  Surgeon: Elaina Hoops, MD;  Location: [facility];  Service: Neurosurgery;  Laterality: N/A;  Lumbar four-five, Lumbar five-Sacral one Maximum Access Surgery posterior lumbar interbody fusion with i    Family Psychiatric History: I have reviewed family psychiatric history from my progress note on 04/26/2020  Family History:  Family History  Problem Relation Age of Onset  . Arthritis Mother   . Hyperlipidemia Mother   . Migraines Mother   . Arthritis Father   . Diabetes Father   . Heart disease Father   . Diabetes Brother   . Breast cancer Maternal Grandmother   . Mental illness Neg Hx     Social History: Reviewed social history from my progress note on 04/26/2020 Social History   Socioeconomic History  . Marital status: Divorced    Spouse name: Not on file  . Number of children: Not on file  . Years of education: Not on file  . Highest education level: Bachelor's degree (e.g., BA, AB, BS)  Occupational History  . Not on file  Tobacco Use  . Smoking status: Former Smoker    Packs/day: 0.50    Years: 20.00    Pack years: 10.00    Quit date: 10/18/2016    Years since quitting: 4.0  . Smokeless tobacco: Never Used  Vaping Use  . Vaping Use: Never used  Substance and Sexual Activity  . Alcohol use: No  . Drug use: No  . Sexual activity: Not Currently  Other Topics Concern  . Not on file  Social History Narrative  . Not on file   Social Determinants of Health   Financial Resource Strain: Not on file  Food Insecurity: Not on file  Transportation Needs: Not on file  Physical Activity: Not on file  Stress: Not on file  Social Connections: Not on file     Allergies:  Allergies  Allergen Reactions  . Shellfish Allergy Hives  . Flexeril [Cyclobenzaprine] Hives    RAPID HEARTBEAT    Metabolic Disorder Labs: No results found for: HGBA1C, MPG No results found for: PROLACTIN Lab Results  Component Value Date   CHOL 193 04/04/2020   TRIG 54 04/04/2020   HDL 59 04/04/2020   CHOLHDL 3.6 10/16/2016   LDLCALC 124 (H) 04/04/2020   LDLCALC 107 (H) 02/06/2018   Lab Results  Component Value Date   TSH 1.560 04/04/2020   TSH 1.290 02/06/2018    Therapeutic Level Labs: No results found for: LITHIUM No results found for: VALPROATE No components found for:  CBMZ  Current Medications: Current Outpatient Medications  Medication Sig Dispense Refill  . albuterol (VENTOLIN HFA) 108 (90 Base) MCG/ACT inhaler Inhale 2 puffs into the lungs every 6 (six) hours as needed  for wheezing or shortness of breath. 8 g 0  . Ascorbic Acid (VITAMIN C PO) Take by mouth.    . busPIRone (BUSPAR) 10 MG tablet Take 1 tablet (10 mg total) by mouth 3 (three) times daily. 90 tablet 1  . cholecalciferol (VITAMIN D) 1000 UNITS tablet Take 1,000 Units by mouth daily.    Stasia Cavalier (EUCRISA) 2 % OINT Apply 1 application topically 2 (two) times daily as needed. 60 g 6  . dextromethorphan 15 MG/5ML syrup Take 10 mLs (30 mg total) by mouth 4 (four) times daily as needed for cough. (Patient not taking: Reported on 07/18/2020) 120 mL 0  . fluticasone (FLONASE) 50 MCG/ACT nasal spray Place 1 spray into both nostrils daily as needed for allergies.     Marland Kitchen gabapentin (NEURONTIN) 600 MG tablet Take 600 mg by mouth 3 (three) times daily.     . halobetasol (ULTRAVATE) 0.05 % cream Apply topically 2 (two) times daily as needed. Up to 2 weeks. Avoid face, groin, underarms 50 g 2  . Halobetasol Prop-Tazarotene (DUOBRII) 0.01-0.045 % LOTN Apply 1 application topically at bedtime. qhs to aa rash on hands and feet until clear, then prn flares, avoid face, groin, axilla 100 g 1  .  meloxicam (MOBIC) 7.5 MG tablet Take 7.5 mg by mouth 2 (two) times daily.    . metaxalone (SKELAXIN) 800 MG tablet Take 800 mg by mouth 3 (three) times daily.    . montelukast (SINGULAIR) 10 MG tablet Take 1 tablet (10 mg total) by mouth at bedtime. 90 tablet 3  . morphine (KADIAN) 30 MG 24 hr capsule Take 30 mg by mouth daily.     Marland Kitchen morphine (MS CONTIN) 30 MG 12 hr tablet Take 30 mg by mouth every morning.     . Multiple Vitamin (MULTIVITAMIN ADULT PO) Take by mouth daily.    . Multiple Vitamins-Minerals (ZINC PO) Take by mouth.    . oxyCODONE (ROXICODONE) 15 MG immediate release tablet Take 15 mg by mouth every 6 (six) hours as needed.   0  . PARoxetine (PAXIL) 20 MG tablet Take 1.5 tablets (30 mg total) by mouth daily. 45 tablet 1  . PFIZER-BIONTECH COVID-19 VACC 30 MCG/0.3ML injection     . propranolol (INDERAL) 20 MG tablet Take 1 tablet (20 mg total) by mouth 2 (two) times daily as needed. For severe panic attacks 60 tablet 1  . SUPREP BOWEL PREP KIT 17.5-3.13-1.6 GM/177ML SOLN Take by mouth as directed.     . triamcinolone cream (KENALOG) 0.1 % Apply 1 application topically 2 (two) times daily. 60 g 6   No current facility-administered medications for this visit.     Musculoskeletal: Strength & Muscle Tone: UTA Gait & Station: UTA Patient leans: N/A  Psychiatric Specialty Exam: Review of Systems  Musculoskeletal: Positive for back pain.  Psychiatric/Behavioral: The patient is nervous/anxious.   All other systems reviewed and are negative.   Last menstrual period 06/20/2016.There is no height or weight on file to calculate BMI.  General Appearance: Casual  Eye Contact:  Fair  Speech:  Clear and Coherent  Volume:  Normal  Mood:  Anxious  Affect:  Congruent  Thought Process:  Goal Directed and Descriptions of Associations: Intact  Orientation:  Full (Time, Place, and Person)  Thought Content: Logical   Suicidal Thoughts:  No  Homicidal Thoughts:  No  Memory:  Immediate;    Fair Recent;   Fair Remote;   Fair  Judgement:  Fair  Insight:  Fair  Psychomotor Activity:  Normal  Concentration:  Concentration: Fair and Attention Span: Fair  Recall:  [employer] of Knowledge: Fair  Language: Fair  Akathisia:  No  Handed:  Right  AIMS (if indicated): UTA  Assets:  Communication Skills Desire for Improvement Housing Social Support  ADL's:  Intact  Cognition: WNL  Sleep:  Fair   Screenings: GAD-7   Flowsheet Row Video Visit from 11/16/2020 in [facility] Video Visit from 09/23/2020 in [facility] Video Visit from 09/02/2020 in [facility] Video Visit from 04/26/2020 in [facility] Office Visit from 04/04/2020 in [facility]  Total GAD-7 Score _0 PHQ2-9   Flowsheet Row Video Visit from 10/19/2020 in [facility] Video Visit from 09/23/2020 in [facility] Video Visit from 09/02/2020 in [facility] Video Visit from 04/26/2020 in [facility] Office Visit from 04/04/2020 in [facility]  PHQ-2 Total Score _1 PHQ-9 Total Score -- _2 Flowsheet Row Video Visit from 10/19/2020 in [facility] No Risk       Assessment and Plan: Jamie [name] is a 50 year old Caucasian female, divorced, employed, lives in [place], has a history of MDD, GAD, panic attacks, chronic pain was evaluated by telemedicine today.  Patient with psychosocial stressors of going through divorce, parents with health problems, her own health issues.  Patient is currently making progress, plan as noted below.  Plan MDD in remission Paxil as prescribed BuSpar at reduced dosage of 10 mg p.o. 3 times daily.  Dose reduced due to bruxism.  GAD-improving GAD 7 today equals 10 Paxil 30  mg p.o. daily Continue CBT with Ms. Zadie Rhine  Panic attacks-stable Continue CBT Propranolol as needed for severe panic symptoms  Follow-up in clinic in 6 to 8 weeks or sooner if needed.  This note was generated in part or whole with voice recognition software. Voice recognition is usually quite accurate but there are transcription errors that can and very often do occur. I apologize for any typographical errors that were not detected and corrected.        Ursula Alert, MD 11/16/2020, 1:52 PM

## 2020-11-16 NOTE — Progress Notes (Signed)
Virtual Visit via Video Note  I connected with Jamie [name] on 11/16/20 at  4:00 PM EDT by a video enabled telemedicine application and verified that I am speaking with the correct person using two identifiers.  Participating Parties Patient Provider  Location: Patient: [employer] Lot Provider: Home Office   I discussed the limitations of evaluation and management by telemedicine and the availability of in person appointments. The patient expressed understanding and agreed to proceed.  THERAPY PROGRESS NOTE  Session Time: 39 Minutes  Participation Level: Active  Behavioral Response: Well GroomedAlertAnxious, Depressed and Tearful  Type of Therapy: Individual Therapy  Treatment Goals addressed: Coping  Interventions: CBT  Summary: Jamie [name] is a 50 y.o. female who presents with depression and anxiety sxs. Pt reported she is "excited about my new job and happy with everyone I am working with. It is a very laid-back environment. My boss is amazing and easy to talk to. I can trust her". Pt reported she has been working there since January 18th. Pt reported she is taking steps to initiate divorce with spouse after being separated for 6 years and living apart. Pt reported this is difficulty because "I don't want to be mean or hurt anyone". Pt reported since medication changes, she has noticed improvement s/ "it is keeping me a little more balanced". 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 Pt reported she is in process of considering surgery for her back and worries that she will "let others down" if she is unable to work or help her parents during recovery. Pt reported "my faith really gets me through a lot". Pt recognized importance of addressing her own needs.   Suicidal/Homicidal: No  Therapist Response: Therapist met with patient for follow up session. Therapist and patient explored current stressors  and attempts to cope. Therapist and patient discussed ways patient is making steps towards working on meeting her own needs and processing the emotions and thoughts that follow. Therapist informed patient regarding initial phase of terminating services with this therapist due to leaving the practice in the next 4 weeks. Pt denied any concerns around this and had no questions at this time.    Plan: Return again in 3 weeks.  Diagnosis: Axis I: Generalized Anxiety Disorder and MDD, Recurrent, Panic Attacks    Axis II: N/A  [name], LCSW, LCAS 11/16/2020

## 2020-11-18 ENCOUNTER — Telehealth: Payer: Self-pay | Admitting: Family Medicine

## 2020-11-18 ENCOUNTER — Other Ambulatory Visit: Payer: Self-pay | Admitting: Family Medicine

## 2020-11-18 MED ORDER — ALPRAZOLAM 0.5 MG PO TABS: ORAL_TABLET | ORAL | 0 refills | Status: DC

## 2020-11-18 NOTE — Telephone Encounter (Signed)
Patient notified via voicemail.

## 2020-11-18 NOTE — Telephone Encounter (Signed)
Rx at her pharmacy 

## 2020-11-18 NOTE — Telephone Encounter (Addendum)
Pt is calling to request an anxiety medication such as xanax or valium because she is having an MRI on Monday.Pt states that she is requesting a script for 2 pills due to her having a MRI on Monday.  Pt was advised that she would need an apt for a prescription. Pt was offered an appt on Monday. And stated that in the past Dr. Jeananne Rama wrote a script for anxiety medication when she had an MRI.  Patient is asking if Dr. Wynetta Emery can prescribe an anxiety medication for her. Preferred Pharmacy- [facility]

## 2020-11-21 ENCOUNTER — Ambulatory Visit
Admission: RE | Admit: 2020-11-21 | Discharge: 2020-11-21 | Disposition: A | Discharge status: Home / Self Care | Payer: 59 | Source: Ambulatory Visit | Attending: Neurosurgery | Admitting: Neurosurgery

## 2020-11-21 ENCOUNTER — Other Ambulatory Visit: Payer: Self-pay

## 2020-11-21 DIAGNOSIS — G8929 Other chronic pain: Secondary | ICD-10-CM | POA: Insufficient documentation

## 2020-11-21 DIAGNOSIS — M960 Pseudarthrosis after fusion or arthrodesis: Secondary | ICD-10-CM

## 2020-11-21 DIAGNOSIS — M5441 Lumbago with sciatica, right side: Secondary | ICD-10-CM | POA: Diagnosis not present

## 2020-11-21 DIAGNOSIS — M545 Low back pain, unspecified: Secondary | ICD-10-CM | POA: Diagnosis not present

## 2020-11-21 MED FILL — Alprazolam Tab 0.5 MG: ORAL | 1 days supply | Qty: 2 | Fill #0 | Status: AC

## 2020-11-21 MED FILL — Paroxetine HCl Tab 20 MG: ORAL | 30 days supply | Qty: 45 | Fill #0 | Status: AC

## 2020-11-22 ENCOUNTER — Other Ambulatory Visit: Payer: Self-pay

## 2020-11-24 ENCOUNTER — Other Ambulatory Visit: Payer: Self-pay

## 2020-11-24 MED ORDER — MORPHINE SULFATE ER 30 MG PO TBCR
EXTENDED_RELEASE_TABLET | ORAL | 0 refills | Status: DC
  Filled 2020-11-25: qty 30, 30d supply, fill #0

## 2020-11-24 MED ORDER — OXYCODONE HCL 15 MG PO TABS
ORAL_TABLET | ORAL | 0 refills | Status: DC
  Filled 2020-11-25: qty 120, 30d supply, fill #0

## 2020-11-25 ENCOUNTER — Other Ambulatory Visit: Payer: Self-pay

## 2020-11-30 ENCOUNTER — Other Ambulatory Visit: Payer: Self-pay

## 2020-11-30 MED FILL — Metaxalone Tab 800 MG: ORAL | 30 days supply | Qty: 120 | Fill #0 | Status: AC

## 2020-11-30 MED FILL — Gabapentin Tab 600 MG: ORAL | 30 days supply | Qty: 90 | Fill #0 | Status: AC

## 2020-12-01 DIAGNOSIS — M960 Pseudarthrosis after fusion or arthrodesis: Secondary | ICD-10-CM | POA: Diagnosis not present

## 2020-12-05 ENCOUNTER — Other Ambulatory Visit: Payer: Self-pay

## 2020-12-05 ENCOUNTER — Encounter: Payer: Self-pay | Admitting: Licensed Clinical Social Worker

## 2020-12-05 ENCOUNTER — Ambulatory Visit (INDEPENDENT_AMBULATORY_CARE_PROVIDER_SITE_OTHER): Payer: 59 | Admitting: Licensed Clinical Social Worker

## 2020-12-05 DIAGNOSIS — F411 Generalized anxiety disorder: Secondary | ICD-10-CM

## 2020-12-05 DIAGNOSIS — F410 Panic disorder [episodic paroxysmal anxiety] without agoraphobia: Secondary | ICD-10-CM | POA: Diagnosis not present

## 2020-12-05 DIAGNOSIS — F3342 Major depressive disorder, recurrent, in full remission: Secondary | ICD-10-CM | POA: Diagnosis not present

## 2020-12-05 NOTE — Progress Notes (Signed)
Virtual Visit via Video Note  I connected with Jamie [name] on 12/05/20 at  4:00 PM EDT by a video enabled telemedicine application and verified that I am speaking with the correct person using two identifiers.  Participating Parties Patient Provider  Location: Patient: Home Provider: Home Office   I discussed the limitations of evaluation and management by telemedicine and the availability of in person appointments. The patient expressed understanding and agreed to proceed.  THERAPY PROGRESS NOTE  Session Time: 30 Minutes  Participation Level: Active  Behavioral Response: Well GroomedAlertAnxious  Type of Therapy: Individual Therapy  Treatment Goals addressed: Anxiety and Coping  Interventions: CBT  Summary: Jamie [name] is a 50 y.o. female who presents with depression and anxiety sxs. Pt reported things with work continue to be "really, really good. I have been keeping busy". Pt reported her main stressors include coordinating recovery time and surgery needed for self in addition to making sure needs are met for her parents' health issues. Pt acknowledged that she does have supports in place through her small group at church and can utilize help offered. Pt reported hx of tendency to "put too much on myself. I come from a family of doers".    Suicidal/Homicidal: No  Therapist Response: Therapist met with patient for follow up session. Therapist and patient explored ruminations and using supports. Therapist validated patient feelings/concerns. Therapist and patient discussed options for continued care.    Plan: Return again as needed to resume therapy with new therapist/pursue other therapy options.  Diagnosis: Axis I: Generalized Anxiety Disorder and MDD, Recurrent, In Remission and Panic Attacks    Axis II: N/A  [name], LCSW, LCAS 12/05/2020

## 2020-12-09 DIAGNOSIS — L409 Psoriasis, unspecified: Secondary | ICD-10-CM | POA: Diagnosis not present

## 2020-12-12 ENCOUNTER — Telehealth: Payer: Self-pay

## 2020-12-12 LAB — CBC WITH DIFFERENTIAL/PLATELET
Basophils Absolute: 0 10*3/uL (ref 0.0–0.2)
Basos: 0 %
EOS (ABSOLUTE): 0.1 10*3/uL (ref 0.0–0.4)
Eos: 1 %
Hematocrit: 38.5 % (ref 34.0–46.6)
Hemoglobin: 13 g/dL (ref 11.1–15.9)
Immature Grans (Abs): 0 10*3/uL (ref 0.0–0.1)
Immature Granulocytes: 0 %
Lymphocytes Absolute: 3.2 10*3/uL — ABNORMAL HIGH (ref 0.7–3.1)
Lymphs: 44 %
MCH: 33.2 pg — ABNORMAL HIGH (ref 26.6–33.0)
MCHC: 33.8 g/dL (ref 31.5–35.7)
MCV: 98 fL — ABNORMAL HIGH (ref 79–97)
Monocytes Absolute: 0.6 10*3/uL (ref 0.1–0.9)
Monocytes: 8 %
Neutrophils Absolute: 3.4 10*3/uL (ref 1.4–7.0)
Neutrophils: 47 %
Platelets: 248 10*3/uL (ref 150–450)
RBC: 3.92 x10E6/uL (ref 3.77–5.28)
RDW: 12.4 % (ref 11.7–15.4)
WBC: 7.3 10*3/uL (ref 3.4–10.8)

## 2020-12-12 LAB — QUANTIFERON-TB GOLD PLUS
QuantiFERON Mitogen Value: >10 IU/mL
QuantiFERON Nil Value: 0.01 IU/mL
QuantiFERON TB1 Ag Value: 0.04 IU/mL
QuantiFERON TB2 Ag Value: 0.05 IU/mL
QuantiFERON-TB Gold Plus: NEGATIVE

## 2020-12-12 LAB — COMPREHENSIVE METABOLIC PANEL
ALT: 13 IU/L (ref 0–32)
AST: 15 IU/L (ref 0–40)
Albumin/Globulin Ratio: 2 (ref 1.2–2.2)
Albumin: 4.6 g/dL (ref 3.8–4.9)
Alkaline Phosphatase: 76 IU/L (ref 44–121)
BUN/Creatinine Ratio: 20 (ref 9–23)
BUN: 11 mg/dL (ref 6–24)
Bilirubin Total: <0.2 mg/dL (ref 0.0–1.2)
CO2: 24 mmol/L (ref 20–29)
Calcium: 9.2 mg/dL (ref 8.7–10.2)
Chloride: 105 mmol/L (ref 96–106)
Creatinine, Ser: 0.56 mg/dL — ABNORMAL LOW (ref 0.57–1.00)
Globulin, Total: 2.3 g/dL (ref 1.5–4.5)
Glucose: 92 mg/dL (ref 65–99)
Potassium: 4.1 mmol/L (ref 3.5–5.2)
Sodium: 144 mmol/L (ref 134–144)
Total Protein: 6.9 g/dL (ref 6.0–8.5)
eGFR: 110 mL/min/{1.73_m2} (ref >59)

## 2020-12-12 LAB — HEPATITIS B SURFACE ANTIBODY,QUALITATIVE: Hep B Surface Ab, Qual: NONREACTIVE

## 2020-12-12 LAB — HIV ANTIBODY (ROUTINE TESTING W REFLEX): HIV Screen 4th Generation wRfx: NONREACTIVE

## 2020-12-12 LAB — HEPATITIS B SURFACE ANTIGEN: Hepatitis B Surface Ag: NEGATIVE

## 2020-12-12 LAB — HEPATITIS C ANTIBODY: Hep C Virus Ab: <0.1 s/co ratio (ref 0.0–0.9)

## 2020-12-12 MED ORDER — COSENTYX SENSOREADY (300 MG) 150 MG/ML ~~LOC~~ SOAJ: 300.0000 mg | SUBCUTANEOUS | 5 refills | Status: DC

## 2020-12-12 MED ORDER — COSENTYX SENSOREADY (300 MG) 150 MG/ML ~~LOC~~ SOAJ: 300.0000 mg | SUBCUTANEOUS | 0 refills | Status: DC

## 2020-12-12 NOTE — Telephone Encounter (Signed)
-----   Message from [name], MD sent at 12/12/2020  8:35 AM EDT ----- Baseline labs okay, TB neg, Hep screening neg, HIV neg. Send in Cosentyx as per OV

## 2020-12-12 NOTE — Telephone Encounter (Signed)
Patient advised of labs. RX sent in to [facility].   Patient rescheduled tomorrow's office visit one month out since she has not started anything new at this time.

## 2020-12-13 ENCOUNTER — Ambulatory Visit: Payer: 59 | Admitting: Dermatology

## 2020-12-19 ENCOUNTER — Other Ambulatory Visit: Payer: Self-pay

## 2020-12-20 DIAGNOSIS — G8929 Other chronic pain: Secondary | ICD-10-CM | POA: Diagnosis not present

## 2020-12-20 DIAGNOSIS — M5441 Lumbago with sciatica, right side: Secondary | ICD-10-CM | POA: Diagnosis not present

## 2020-12-20 DIAGNOSIS — M5442 Lumbago with sciatica, left side: Secondary | ICD-10-CM | POA: Diagnosis not present

## 2020-12-26 ENCOUNTER — Other Ambulatory Visit: Payer: Self-pay

## 2020-12-26 MED FILL — Oxycodone HCl Tab 15 MG: ORAL | 30 days supply | Qty: 120 | Fill #0 | Status: AC

## 2020-12-26 MED FILL — Morphine Sulfate Tab ER 30 MG: ORAL | 30 days supply | Qty: 30 | Fill #0 | Status: CN

## 2020-12-27 DIAGNOSIS — M5441 Lumbago with sciatica, right side: Secondary | ICD-10-CM | POA: Diagnosis not present

## 2020-12-27 DIAGNOSIS — G8929 Other chronic pain: Secondary | ICD-10-CM | POA: Diagnosis not present

## 2020-12-27 DIAGNOSIS — M5442 Lumbago with sciatica, left side: Secondary | ICD-10-CM | POA: Diagnosis not present

## 2021-01-02 ENCOUNTER — Other Ambulatory Visit: Payer: Self-pay

## 2021-01-02 ENCOUNTER — Other Ambulatory Visit: Payer: Self-pay | Admitting: Psychiatry

## 2021-01-02 DIAGNOSIS — F410 Panic disorder [episodic paroxysmal anxiety] without agoraphobia: Secondary | ICD-10-CM

## 2021-01-02 DIAGNOSIS — F411 Generalized anxiety disorder: Secondary | ICD-10-CM

## 2021-01-02 DIAGNOSIS — G8929 Other chronic pain: Secondary | ICD-10-CM | POA: Diagnosis not present

## 2021-01-02 DIAGNOSIS — M5441 Lumbago with sciatica, right side: Secondary | ICD-10-CM | POA: Diagnosis not present

## 2021-01-02 DIAGNOSIS — M5442 Lumbago with sciatica, left side: Secondary | ICD-10-CM | POA: Diagnosis not present

## 2021-01-02 MED ORDER — GABAPENTIN 600 MG PO TABS
ORAL_TABLET | Freq: Three times a day (TID) | ORAL | 1 refills | Status: DC
  Filled 2021-01-02: qty 90, 30d supply, fill #0

## 2021-01-02 MED FILL — Morphine Sulfate Tab ER 30 MG: ORAL | 30 days supply | Qty: 30 | Fill #0 | Status: AC

## 2021-01-02 MED FILL — Metaxalone Tab 800 MG: ORAL | 30 days supply | Qty: 120 | Fill #0 | Status: AC

## 2021-01-02 MED FILL — Paroxetine HCl Tab 20 MG: ORAL | 30 days supply | Qty: 45 | Fill #0 | Status: AC

## 2021-01-02 MED FILL — Buspirone HCl Tab 10 MG: ORAL | 30 days supply | Qty: 90 | Fill #0 | Status: AC

## 2021-01-09 ENCOUNTER — Telehealth: Payer: Self-pay

## 2021-01-09 NOTE — Telephone Encounter (Signed)
Called pt LM on VM she will need to contact biologics at [phone] to discuss Cosentyx injections

## 2021-01-10 ENCOUNTER — Other Ambulatory Visit (HOSPITAL_COMMUNITY): Payer: Self-pay

## 2021-01-11 ENCOUNTER — Telehealth: Payer: 59 | Admitting: Psychiatry

## 2021-01-11 ENCOUNTER — Other Ambulatory Visit: Payer: Self-pay

## 2021-01-11 ENCOUNTER — Other Ambulatory Visit (HOSPITAL_COMMUNITY): Payer: Self-pay

## 2021-01-11 ENCOUNTER — Ambulatory Visit (INDEPENDENT_AMBULATORY_CARE_PROVIDER_SITE_OTHER): Payer: 59

## 2021-01-11 DIAGNOSIS — L409 Psoriasis, unspecified: Secondary | ICD-10-CM | POA: Diagnosis not present

## 2021-01-11 MED ORDER — SECUKINUMAB (300 MG DOSE) 150 MG/ML ~~LOC~~ SOAJ
300.0000 mg | Freq: Once | SUBCUTANEOUS | Status: AC
  Administered 2021-01-11: 300 mg via SUBCUTANEOUS

## 2021-01-11 MED FILL — Montelukast Sodium Tab 10 MG (Base Equiv): ORAL | 90 days supply | Qty: 90 | Fill #0 | Status: AC

## 2021-01-11 NOTE — Progress Notes (Signed)
Pt here for week 0 loading dose of 300 mg Cosentyx.   150 mg/mL injected into right and left arm. Pt tolerated well.   Lot: [id number]  Exp: 03/2022

## 2021-01-12 ENCOUNTER — Other Ambulatory Visit (HOSPITAL_COMMUNITY): Payer: Self-pay

## 2021-01-12 MED ORDER — COSENTYX (300 MG DOSE) 150 MG/ML ~~LOC~~ SOSY
PREFILLED_SYRINGE | SUBCUTANEOUS | 5 refills | Status: DC
Start: 2020-12-12 — End: 2021-03-30
  Filled 2021-03-30: qty 2, 28d supply, fill #0

## 2021-01-12 MED ORDER — COSENTYX SENSOREADY (300 MG) 150 MG/ML ~~LOC~~ SOAJ: SUBCUTANEOUS | 0 refills | Status: DC

## 2021-01-23 DIAGNOSIS — M5136 Other intervertebral disc degeneration, lumbar region: Secondary | ICD-10-CM | POA: Diagnosis not present

## 2021-01-23 DIAGNOSIS — Z6825 Body mass index (BMI) 25.0-25.9, adult: Secondary | ICD-10-CM | POA: Diagnosis not present

## 2021-01-23 DIAGNOSIS — I10 Essential (primary) hypertension: Secondary | ICD-10-CM | POA: Diagnosis not present

## 2021-01-23 DIAGNOSIS — M5416 Radiculopathy, lumbar region: Secondary | ICD-10-CM | POA: Diagnosis not present

## 2021-01-23 DIAGNOSIS — M961 Postlaminectomy syndrome, not elsewhere classified: Secondary | ICD-10-CM | POA: Diagnosis not present

## 2021-01-24 ENCOUNTER — Ambulatory Visit: Payer: 59 | Admitting: Dermatology

## 2021-01-25 ENCOUNTER — Telehealth: Payer: 59 | Admitting: Psychiatry

## 2021-01-25 ENCOUNTER — Other Ambulatory Visit: Payer: Self-pay

## 2021-02-07 ENCOUNTER — Encounter: Payer: Self-pay | Admitting: Psychiatry

## 2021-02-07 ENCOUNTER — Other Ambulatory Visit: Payer: Self-pay

## 2021-02-07 ENCOUNTER — Telehealth (INDEPENDENT_AMBULATORY_CARE_PROVIDER_SITE_OTHER): Payer: 59 | Admitting: Psychiatry

## 2021-02-07 DIAGNOSIS — F410 Panic disorder [episodic paroxysmal anxiety] without agoraphobia: Secondary | ICD-10-CM

## 2021-02-07 DIAGNOSIS — F411 Generalized anxiety disorder: Secondary | ICD-10-CM | POA: Diagnosis not present

## 2021-02-07 DIAGNOSIS — F331 Major depressive disorder, recurrent, moderate: Secondary | ICD-10-CM

## 2021-02-07 MED ORDER — QUETIAPINE FUMARATE 25 MG PO TABS: 25.0000 mg | ORAL_TABLET | Freq: Every day | ORAL | 1 refills | Status: DC

## 2021-02-07 NOTE — Patient Instructions (Signed)
Quetiapine tablets What is this medication? QUETIAPINE (kwe TYE a peen) is an antipsychotic. It is used to treatschizophrenia and bipolar disorder, also known as manic-depression. This medicine may be used for other purposes; ask your health care provider orpharmacist if you have questions. COMMON BRAND NAME(S): Seroquel What should I tell my care team before I take this medication? They need to know if you have any of these conditions: blockage in your bowel cataracts constipation dementia diabetes difficulty swallowing glaucoma heart disease high levels of prolactin history of breast cancer history of irregular heartbeat liver disease low blood counts, like low white cell, platelet, or red cell counts low blood pressure Parkinson's disease prostate disease seizures suicidal thoughts, plans or attempt; a previous suicide attempt by you or a family member thyroid disease trouble passing urine an unusual or allergic reaction to quetiapine, other medicines, foods, dyes, or preservatives pregnant or trying to get pregnant breast-feeding How should I use this medication? Take this medicine by mouth. Swallow it with a drink of water. Follow the directions on the prescription label. If it upsets your stomach you can take it with food. Take your medicine at regular intervals. Do not take it more often than directed. Do not stop taking except on the advice of your doctor or healthcare professional. A special MedGuide will be given to you by the pharmacist with eachprescription and refill. Be sure to read this information carefully each time. Talk to your pediatrician regarding the use of this medicine in children. While this drug may be prescribed for children as young as 10 years for selectedconditions, precautions do apply. Patients over age 45 years may have a stronger reaction to this medicine andneed smaller doses. Overdosage: If you think you have taken too much of this medicine  contact apoison control center or emergency room at once. NOTE: This medicine is only for you. Do not share this medicine with others. What if I miss a dose? If you miss a dose, take it as soon as you can. If it is almost time for yournext dose, take only that dose. Do not take double or extra doses. What may interact with this medication? Do not take this medicine with any of the following medications: cisapride dronedarone metoclopramide pimozide thioridazine This medicine may also interact with the following medications: alcohol antihistamines for allergy, cough, and cold atropine avasimibe certain antivirals for HIV or hepatitis certain medicines for anxiety or sleep certain medicines for bladder problems like oxybutynin, tolterodine certain medicines for depression like amitriptyline, fluoxetine, nefazodone, sertraline certain medicines for fungal infections like fluconazole, ketoconazole, itraconazole, posaconazole certain medicines for stomach problems like dicyclomine, hyoscyamine certain medicines for travel sickness like scopolamine cimetidine general anesthetics like halothane, isoflurane, methoxyflurane, propofol ipratropium levodopa or other medicines for Parkinson's disease medicines for blood pressure medicines for seizures medicines that relax muscles for surgery narcotic medicines for pain other medicines that prolong the QT interval (cause an abnormal heart rhythm) phenothiazines like chlorpromazine, prochlorperazine rifampin St. John's wort This list may not describe all possible interactions. Give your health care provider a list of all the medicines, herbs, non-prescription drugs, or dietary supplements you use. Also tell them if you smoke, drink alcohol, or use illegaldrugs. Some items may interact with your medicine. What should I watch for while using this medication? Visit your health care professional for regular checks on your progress. Tell your health  care professional if symptoms do not start to get better or if they get worse. Do not stop taking  except on your health care professional's advice. You may develop a severe reaction. Your health care professional will tell youhow much medicine to take. You may need to have an eye exam before and during use of this medicine. This medicine may increase blood sugar. Ask your health care provider ifchanges in diet or medicines are needed if you have diabetes. Patients and their families should watch out for new or worsening depression or thoughts of suicide. Also watch out for sudden or severe changes in feelings such as feeling anxious, agitated, panicky, irritable, hostile, aggressive, impulsive, severely restless, overly excited and hyperactive, or not being able to sleep. If this happens, especially at the beginning of antidepressanttreatment or after a change in dose, call your health care professional. Dennis Bast may get dizzy or drowsy. Do not drive, use machinery, or do anything that needs mental alertness until you know how this medicine affects you. Do not stand or sit up quickly, especially if you are an older patient. This reduces the risk of dizzy or fainting spells. Alcohol may interfere with the effect ofthis medicine. Avoid alcoholic drinks. This drug can cause problems with controlling your body temperature. It can lower the response of your body to cold temperatures. If possible, stay indoors during cold weather. If you must go outdoors, wear warm clothes. It can also lower the response of your body to heat. Do not overheat. Do not over-exercise. Stay out of the sun when possible. If you must be in the sun, wear cool clothing. Drink plenty of water. If you have trouble controlling your bodytemperature, call your health care provider right away. What side effects may I notice from receiving this medication? Side effects that you should report to your doctor or health care professionalas soon as  possible: allergic reactions like skin rash, itching or hives, swelling of the face, lips, or tongue breathing problems changes in vision confusion elevated mood, decreased need for sleep, racing thoughts, impulsive behavior eye pain fast, irregular heartbeat fever or chills, sore throat inability to keep still males: prolonged or painful erection problems with balance, talking, walking redness, blistering, peeling, or loosening of the skin, including inside the mouth seizures signs and symptoms of high blood sugar such as being more thirsty or hungry or having to urinate more than normal. You may also feel very tired or have blurry vision signs and symptoms of hypothyroidism like fatigue; increased sensitivity to cold; weight gain; hoarseness; thinning hair signs and symptoms of low blood pressure like dizziness; feeling faint or lightheaded; falls; unusually weak or tired signs and symptoms of neuroleptic malignant syndrome like confusion; fast, irregular heartbeat; high fever; increased sweating; stiff muscles sudden numbness or weakness of the face, arm, or leg suicidal thoughts or other mood changes trouble swallowing uncontrollable movements of the arms, face, head, mouth, neck, or upper body Side effects that usually do not require medical attention (report to yourdoctor or health care professional if they continue or are bothersome): change in sex drive or performance constipation drowsiness dry mouth upset stomach weight gain This list may not describe all possible side effects. Call your doctor for medical advice about side effects. You may report side effects to FDA at1-800-FDA-1088. Where should I keep my medication? Keep out of the reach of children. Store at room temperature between 15 and 30 degrees C (59 and 86 degrees F).Throw away any unused medicine after the expiration date. NOTE: This sheet is a summary. It may not cover all possible information. If you have  questions about this medicine, talk to your doctor, pharmacist, orhealth care provider.  2022 Elsevier/Gold Standard (2019-06-24 14:42:48)

## 2021-02-07 NOTE — Progress Notes (Signed)
Virtual Visit via Video Note  I connected with Jamie [name] on 02/07/21 at 11:00 AM EDT by a video enabled telemedicine application and verified that I am speaking with the correct person using two identifiers.  Location Provider Location : [facility] Patient Location : Car  Participants: Patient , Provider   I discussed the limitations of evaluation and management by telemedicine and the availability of in person appointments. The patient expressed understanding and agreed to proceed.    I discussed the assessment and treatment plan with the patient. The patient was provided an opportunity to ask questions and all were answered. The patient agreed with the plan and demonstrated an understanding of the instructions.   The patient was advised to call back or seek an in-person evaluation if the symptoms worsen or if the condition fails to improve as anticipated.  Video connection was lost at less than 50% of the duration of the visit, at which time the remainder of the visit was completed through audio only      [facility] MD OP Progress Note  02/07/2021 11:38 AM Jamie [name]  MRN:  [id number]  Chief Complaint:  Chief Complaint   Follow-up; Anxiety; Depression    HPI: Jamie [name] is a 50 year old Caucasian female, employed, divorced, lives in [place], has a history of MDD, panic attacks, GAD, chronic pain was evaluated by telemedicine today.  Patient today reports her mother is currently struggling with health problems and will need surgery on Monday.  She is hence going to take a week off from work.  She reports she has been having worsening mood symptoms, depression and anxiety since the past few weeks since she learned about her mother.  She reports sadness, crying spells, low motivation, anhedonia, concentration problems, racing thoughts, sleep problems.  Patient denies any suicidality, homicidality or perceptual disturbances.  Patient reports she has not been able to find a therapist  since her previous therapist left the office.  Her therapist had provided her with resources however she never got time to reach out to anyone in the community yet.  She however is willing to give it a try.  Patient denies any side effects to medications.  She reports she enjoys her work however feels as though she is falling behind with her assignments.  It is likely because of her recent psychosocial stressors.  Patient denies any other concerns today.  Visit Diagnosis:    ICD-10-CM   1. MDD (major depressive disorder), recurrent episode, moderate (HCC)  F33.1 QUEtiapine (SEROQUEL) 25 MG tablet    2. GAD (generalized anxiety disorder)  F41.1 QUEtiapine (SEROQUEL) 25 MG tablet    3. Panic attack  F41.0       Past Psychiatric History: I have reviewed past psychiatric history from progress note on 04/26/2020.  Past trials of medications- Trintellix-expensive Seroquel Abilify Effexor Sertraline Cymbalta-bruxism Belsomra Trazodone Ambien Klonopin  Past Medical History:  Past Medical History:  Diagnosis Date   Anxiety    takes Ativan daily.  Panic Attack   Arthritis    Chronic back pain    herniated disc/stenosis/scoliosis   Depression    takes Effexor daily   Eczema    uses a cream daily as needed   Family history of adverse reaction to anesthesia    pta dad is very hard to wake up;excessive nausea   History of bronchitis    > 41yr[age]   Hx of dysplastic nevus 05/10/2020   neck - posterior, Severe Atypia. Shave removal 07/18/2020, margins free  Hypoglycemia    Insomnia    takes Lorazepam nightly   Joint pain    PONV (postoperative nausea and vomiting)    Seasonal allergies    uses Flonase daily   Vitamin D deficiency    takes Vit D daily    Past Surgical History:  Procedure Laterality Date   BACK SURGERY  2008/2012   laminectomy 1st time and 2nd time laminectomy and fusion   COLONOSCOPY WITH PROPOFOL N/A 05/27/2020   Procedure: COLONOSCOPY WITH PROPOFOL;   Surgeon: Virgel Manifold, MD;  Location: [facility];  Service: Gastroenterology;  Laterality: N/A;  COVID POSITIVE AUGUST 16   MAXIMUM ACCESS (MAS)POSTERIOR LUMBAR INTERBODY FUSION (PLIF) 2 LEVEL N/A 09/01/2014   Procedure: Lumbar four-five, Lumbar five-Sacral one Maximum Access Surgery posterior lumbar interbody fusion with interbody prosthesis posterior lateral arthrodesis posterior segmental instrumentation;  Surgeon: Elaina Hoops, MD;  Location: [place] [facility];  Service: Neurosurgery;  Laterality: N/A;  Lumbar four-five, Lumbar five-Sacral one Maximum Access Surgery posterior lumbar interbody fusion with i    Family Psychiatric History: I have reviewed family psychiatric history from progress note on 04/26/2020  Family History:  Family History  Problem Relation Age of Onset   Arthritis Mother    Hyperlipidemia Mother    Migraines Mother    Arthritis Father    Diabetes Father    Heart disease Father    Diabetes Brother    Breast cancer Maternal Grandmother    Mental illness Neg Hx     Social History: Reviewed social history from progress note on 04/26/2020 Social History   Socioeconomic History   Marital status: Divorced    Spouse name: Not on file   Number of children: Not on file   Years of education: Not on file   Highest education level: Bachelor's degree (e.g., BA, AB, BS)  Occupational History   Not on file  Tobacco Use   Smoking status: Former    Packs/day: 0.50    Years: 20.00    Pack years: 10.00    Types: Cigarettes    Quit date: 10/18/2016    Years since quitting: 4.3   Smokeless tobacco: Never  Vaping Use   Vaping Use: Never used  Substance and Sexual Activity   Alcohol use: No   Drug use: No   Sexual activity: Not Currently  Other Topics Concern   Not on file  Social History Narrative   Not on file   Social Determinants of Health   Financial Resource Strain: Not on file  Food Insecurity: Not on file  Transportation Needs: Not on file  Physical  Activity: Not on file  Stress: Not on file  Social Connections: Not on file    Allergies:  Allergies  Allergen Reactions   Shellfish Allergy Hives   Flexeril [Cyclobenzaprine] Hives    RAPID HEARTBEAT    Metabolic Disorder Labs: No results found for: HGBA1C, MPG No results found for: PROLACTIN Lab Results  Component Value Date   CHOL 193 04/04/2020   TRIG 54 04/04/2020   HDL 59 04/04/2020   CHOLHDL 3.6 10/16/2016   LDLCALC 124 (H) 04/04/2020   LDLCALC 107 (H) 02/06/2018   Lab Results  Component Value Date   TSH 1.560 04/04/2020   TSH 1.290 02/06/2018    Therapeutic Level Labs: No results found for: LITHIUM No results found for: VALPROATE No components found for:  CBMZ  Current Medications: Current Outpatient Medications  Medication Sig Dispense Refill   QUEtiapine (SEROQUEL) 25 MG tablet Take  1 tablet (25 mg total) by mouth at bedtime. 30 tablet 1   albuterol (VENTOLIN HFA) 108 (90 Base) MCG/ACT inhaler Inhale 2 puffs into the lungs every 6 (six) hours as needed for wheezing or shortness of breath. 8 g 0   Ascorbic Acid (VITAMIN C PO) Take by mouth.     BOOSTRIX 5-2.5-18.5 LF-MCG/0.5 injection      busPIRone (BUSPAR) 10 MG tablet TAKE 1 TABLET (10 MG TOTAL) BY MOUTH 3 (THREE) TIMES DAILY. 90 tablet 1   cholecalciferol (VITAMIN D) 1000 UNITS tablet Take 1,000 Units by mouth daily.     COVID-19 mRNA vaccine, Pfizer, 30 MCG/0.3ML injection USE AS DIRECTED .3 mL 0   Crisaborole (EUCRISA) 2 % OINT Apply 1 application topically 2 (two) times daily as needed. 60 g 6   dextromethorphan 15 MG/5ML syrup Take 10 mLs (30 mg total) by mouth 4 (four) times daily as needed for cough. (Patient not taking: Reported on 07/18/2020) 120 mL 0   fluticasone (FLONASE) 50 MCG/ACT nasal spray Place 1 spray into both nostrils daily as needed for allergies.      gabapentin (NEURONTIN) 600 MG tablet Take 600 mg by mouth 3 (three) times daily.      gabapentin (NEURONTIN) 600 MG tablet TAKE 1  TABLET BY MOUTH 3 TIMES DAILY 90 tablet 1   gabapentin (NEURONTIN) 600 MG tablet TAKE 1 TABLET BY MOUTH 3 TIMES DAILY 90 tablet 2   gabapentin (NEURONTIN) 600 MG tablet TAKE 1 TABLET BY MOUTH THREE TIMES DAILY 90 tablet 1   halobetasol (ULTRAVATE) 0.05 % cream Apply topically 2 (two) times daily as needed. Up to 2 weeks. Avoid face, groin, underarms 50 g 2   Halobetasol Prop-Tazarotene (DUOBRII) 0.01-0.045 % LOTN Apply 1 application topically at bedtime. qhs to aa rash on hands and feet until clear, then prn flares, avoid face, groin, axilla 100 g 1   meloxicam (MOBIC) 7.5 MG tablet Take 7.5 mg by mouth 2 (two) times daily.     meloxicam (MOBIC) 7.5 MG tablet TAKE 1 TABLET BY MOUTH TWICE DAILY 60 tablet 2   meloxicam (MOBIC) 7.5 MG tablet TAKE 1 TABLET BY MOUTH 2 TIMES DAILY 60 tablet 2   meloxicam (MOBIC) 7.5 MG tablet TAKE 1 TABLET BY MOUTH TWO TIMES DAILY 60 tablet 2   metaxalone (SKELAXIN) 800 MG tablet Take 800 mg by mouth 3 (three) times daily.     metaxalone (SKELAXIN) 800 MG tablet TAKE 1 TABLET BY MOUTH 4 TIMES DAILY AS NEEDED 120 tablet 2   metaxalone (SKELAXIN) 800 MG tablet TAKE 1 TABLET BY MOUTH 4 TIMES DAILY AS NEEDED 120 tablet 1   metaxalone (SKELAXIN) 800 MG tablet TAKE 1 TABLET BY MOUTH FOUR TIMES DAILY AS NEEDED 120 tablet 2   montelukast (SINGULAIR) 10 MG tablet TAKE 1 TABLET BY MOUTH AT BEDTIME 90 tablet 3   morphine (KADIAN) 30 MG 24 hr capsule Take 30 mg by mouth daily.      morphine (MS CONTIN) 30 MG 12 hr tablet Take 30 mg by mouth every morning.      morphine (MS CONTIN) 30 MG 12 hr tablet TAKE 1 TABLET BY MOUTH EVERY MORNING 30 tablet 0   morphine (MS CONTIN) 30 MG 12 hr tablet TAKE 1 TABLET BY MOUTH EVERY AM (DNF 10/01/20). MAY FILL ON 09/30/20. 30 tablet 0   morphine (MS CONTIN) 30 MG 12 hr tablet TAKE 1 TABLET BY MOUTH ONCE DAILY EVERY MORNING 30 tablet 0   morphine (MS  CONTIN) 30 MG 12 hr tablet take 1 tablet by mouth every morning 30 tablet 0   Multiple Vitamin  (MULTIVITAMIN ADULT PO) Take by mouth daily.     Multiple Vitamins-Minerals (ZINC PO) Take by mouth.     Na Sulfate-K Sulfate-Mg Sulf 17.5-3.13-1.6 GM/177ML SOLN USE AS DIRECTED 354 mL 0   oxyCODONE (ROXICODONE) 15 MG immediate release tablet Take 15 mg by mouth every 6 (six) hours as needed.   0   oxyCODONE (ROXICODONE) 15 MG immediate release tablet TAKE 1 TABLET BY MOUTH EVERY 6 HOURS AS NEEDED FOR BREAKTHROUGH PAIN (DNF 12/26/20) 120 tablet 0   oxyCODONE (ROXICODONE) 15 MG immediate release tablet TAKE 1 TABLET BY MOUTH EVERY 6 HOURS AS NEEDED FOR BREAKTHROUGH PAIN (DNF 10/28/20) 120 tablet 0   oxyCODONE (ROXICODONE) 15 MG immediate release tablet TAKE 1 TABLET BY MOUTH EVERY 6 HOURS AS NEEDED FOR BREAKTHROUGH PAIN 120 tablet 0   oxyCODONE (ROXICODONE) 15 MG immediate release tablet take 1 tablet by mouth every 6 hours as needed for breakthrough pain 120 tablet 0   PARoxetine (PAXIL) 20 MG tablet TAKE 1 & 1/2 TABLETS (30 MG) BY MOUTH DAILY. 45 tablet 1   PFIZER-BIONTECH COVID-19 VACC 30 MCG/0.3ML injection      propranolol (INDERAL) 20 MG tablet TAKE 1 TABLET (20 MG TOTAL) BY MOUTH 2 (TWO) TIMES DAILY AS NEEDED FOR SEVERE PANIC ATTACKS 60 tablet 1   Secukinumab, 300 MG Dose, (COSENTYX SENSOREADY, 300 MG,) 150 MG/ML SOAJ Inject 300 mg into the skin every 28 (twenty-eight) days. For maintenance. 2 mL 5   Secukinumab, 300 MG Dose, (COSENTYX SENSOREADY, 300 MG,) 150 MG/ML SOAJ Inject 300 mg into the skin as directed. On week 0, 1, 2, 3 and 4. 10 mL 0   Secukinumab, 300 MG Dose, (COSENTYX SENSOREADY, 300 MG,) 150 MG/ML SOAJ Inject 300 mg into the skin as directed on week 0, 1, 2, 3 and 4 10 mL 0   Secukinumab, 300 MG Dose, (COSENTYX, 300 MG DOSE,) 150 MG/ML SOSY Inject 300 mg into the skin every 28 days for maintenance 2 mL 5   SUPREP BOWEL PREP KIT 17.5-3.13-1.6 GM/177ML SOLN Take by mouth as directed.      triamcinolone cream (KENALOG) 0.1 % Apply 1 application topically 2 (two) times daily. 60 g 6    No current facility-administered medications for this visit.     Musculoskeletal: Strength & Muscle Tone:  UTA Gait & Station:  UTA Patient leans: N/A  Psychiatric Specialty Exam: Review of Systems  Musculoskeletal:  Positive for back pain (Chronic - follows up with pain management).  Psychiatric/Behavioral:  Positive for decreased concentration, dysphoric mood and sleep disturbance. The patient is nervous/anxious.   All other systems reviewed and are negative.  Last menstrual period 06/20/2016.There is no height or weight on file to calculate BMI.  General Appearance: Fairly Groomed  Eye Contact:  Poor  Speech:  Clear and Coherent  Volume:  Normal  Mood:  Anxious and Depressed  Affect:  Tearful  Thought Process:  Goal Directed and Descriptions of Associations: Intact  Orientation:  Full (Time, Place, and Person)  Thought Content: Logical   Suicidal Thoughts:  No  Homicidal Thoughts:  No  Memory:  Immediate;   Fair Recent;   Fair Remote;   Fair  Judgement:  Fair  Insight:  Fair  Psychomotor Activity:  Normal  Concentration:  Concentration: Fair and Attention Span: Fair  Recall:  Good  Fund of Knowledge: Fair  Language: Fair  Akathisia:  No  Handed:  Right  AIMS (if indicated): not done  Assets:  Communication Skills Desire for Improvement Housing Talents/Skills Transportation Vocational/Educational  ADL's:  Intact  Cognition: WNL  Sleep:  Poor   Screenings: GAD-7    Flowsheet Row Video Visit from 02/07/2021 in [place] Video Visit from 11/16/2020 in [place] Video Visit from 09/23/2020 in [place] Video Visit from 09/02/2020 in [place] Video Visit from 04/26/2020 in [place]  Total GAD-7 Score _0 PHQ2-9    Flowsheet Row Video Visit from 02/07/2021 in [place] Video Visit from 10/19/2020 in [place] Video Visit from 09/23/2020 in [place] Video Visit from 09/02/2020 in [place] Video Visit from 04/26/2020 in [place]  PHQ-2 Total Score _1 PHQ-9 Total Score 22 -- _2 Flowsheet Row Video Visit from 02/07/2021 in [place] Video Visit from 10/19/2020 in [place] No Risk No Risk        Assessment and Plan: Alyna Stensland is a 50 year old Caucasian female, divorced, employed, lives in [place], has a history of MDD, GAD, panic attacks, chronic pain was evaluated by telemedicine today.  Patient with recent psychosocial stressors of her mother with medical problems requiring surgery.  Patient is currently struggling with depression, anxiety, sleep problems and will benefit from medication management and psychotherapy sessions.  Plan MDD-unstable Continue Paxil 30 mg p.o. daily Continue BuSpar 10 mg p.o. 3 times daily-reduced dosage due to bruxism Start Seroquel 25 mg p.o. nightly with plan to increase the dosage gradually.  Encouraged compliance. Patient to start psychotherapy sessions, provided information for employee assistance program, as well as advised to reach out to health insurance plan for a therapist in the community.  She also has therapist resources provided by her previous therapist.  GAD-unstable Paxil 30 mg p.o. daily Start Seroquel 25 mg p.o. nightly Restart CBT  Panic attacks-stable Continue CBT Propranolol as needed for severe panic symptoms   I have spent at least 22 minutes non face to face with patient today .   Follow-up in clinic in 4 weeks in office.  This note was generated in part or whole with voice recognition software. Voice recognition is usually quite accurate but there are transcription  errors that can and very often do occur. I apologize for any typographical errors that were not detected and corrected.       Jamie Alert, MD 02/08/2021, 6:25 PM

## 2021-02-08 ENCOUNTER — Ambulatory Visit (INDEPENDENT_AMBULATORY_CARE_PROVIDER_SITE_OTHER): Payer: 59 | Admitting: Dermatology

## 2021-02-08 ENCOUNTER — Encounter: Payer: Self-pay | Admitting: Dermatology

## 2021-02-08 ENCOUNTER — Other Ambulatory Visit: Payer: Self-pay

## 2021-02-08 DIAGNOSIS — L409 Psoriasis, unspecified: Secondary | ICD-10-CM | POA: Diagnosis not present

## 2021-02-08 DIAGNOSIS — L405 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, unspecified: Secondary | ICD-10-CM

## 2021-02-08 NOTE — Progress Notes (Signed)
   Follow-Up Visit   Subjective  Jamie [name] is a 50 y.o. female who presents for the following: Psoriasis (1 month follow-up. Psoriasis. Palms, soles. On Cosentyx since 01/11/2021. Doing injections at home. Tolerating well. No injection site reactions. No adverse reactions. Patient reports psoriasis has shown a lot of improvement. Today is last loading dose, per patient. Thinks joint pain has improved since starting. ). She is using topical duobrii lotion at night.  Insurance didn't cover Cosentyx, so she is getting it through the drug company.   The following portions of the chart were reviewed this encounter and updated as appropriate:       Review of Systems: No other skin or systemic complaints except as noted in HPI or Assessment and Plan.   Objective  Well appearing patient in no apparent distress; mood and affect are within normal limits.  A focused examination was performed including hands and feet. Relevant physical exam findings are noted in the Assessment and Plan.  palmar, plantar B/L Mild erythema with scaling at B/L palms, few scattered microvesicles. Hyperkeratosis at plantar feet, heels. Left worse than right.   Assessment & Plan  Psoriasis palmar, plantar B/L  With PSA, improving, not at goal  Psoriasis - severe on systemic "biologic" treatment injections.  Psoriasis is a chronic non-curable, but treatable genetic/hereditary disease that may have other systemic features affecting other organ systems such as joints (Psoriatic Arthritis).  It is linked with heart disease, inflammatory bowel disease, non-alcoholic fatty liver disease, and depression. Significant skin psoriasis and/or psoriatic arthritis may have significant symptoms and affects activities of daily activity and often benefits from systemic "biologic" injection treatments.  These "biologic" treatments have some potential side effects including immunosuppression and require pre-treatment laboratory screening  and periodic laboratory monitoring and periodic in person evaluation and monitoring by the attending dermatologist physician.   Continue Cosentyx as directed. Last loading dose due today. Then will start monthly injections  Continue Duobrii qhs as directed.   Start Eucerin roughness relief cream qd/bid to feet/hands prn  Reviewed risks of biologics including immunosuppression, infections, injection site reaction, and failure to improve condition. Goal is control of skin condition, not cure.  Some older biologics such as Humira and Enbrel may slightly increase risk of malignancy and may worsen congestive heart failure. The use of biologics requires long term medication management, including periodic office visits and monitoring of blood work.   Related Medications Halobetasol Prop-Tazarotene (DUOBRII) 0.01-0.045 % LOTN Apply 1 application topically at bedtime. qhs to aa rash on hands and feet until clear, then prn flares, avoid face, groin, axilla  Return in about 3 months (around 05/11/2021) for psoriasis/biologic recheck.   I, Emelia Salisbury, CMA, am acting as scribe for [name], MD.  Documentation: I have reviewed the above documentation for accuracy and completeness, and I agree with the above.  [name] MD

## 2021-02-08 NOTE — Patient Instructions (Addendum)
Psoriasis is a chronic non-curable, but treatable genetic/hereditary disease that may have other systemic features affecting other organ systems such as joints (Psoriatic Arthritis). It is associated with an increased risk of inflammatory bowel disease, heart disease, non-alcoholic fatty liver disease, and depression.     Reviewed risks of biologics including immunosuppression, infections, injection site reaction, and failure to improve condition. Goal is control of skin condition, not cure.  Some older biologics such as Humira and Enbrel may slightly increase risk of malignancy and may worsen congestive heart failure. The use of biologics requires long term medication management, including periodic office visits and monitoring of blood work.  If you have any questions or concerns for your doctor, please call our main line at [phone] and press option 4 to reach your doctor's medical assistant. If no one answers, please leave a voicemail as directed and we will return your call as soon as possible. Messages left after 4 pm will be answered the following business day.   You may also send [place] a message via [place]. We typically respond to MyChart messages within 1-2 business days.  For prescription refills, please ask your pharmacy to contact our office. Our fax number is [phone].  If you have an urgent issue when the clinic is closed that cannot wait until the next business day, you can page your doctor at the number below.    Please note that while we do our best to be available for urgent issues outside of office hours, we are not available 24/7.   If you have an urgent issue and are unable to reach [place], you may choose to seek medical care at your doctor's office, retail clinic, urgent care center, or emergency room.  If you have a medical emergency, please immediately call 911 or go to the emergency department.  Pager Numbers  - Dr. Nehemiah Massed: [phone]  - Dr. Laurence Ferrari: [phone]  -  Dr. Nicole Kindred: [phone]  In the event of inclement weather, please call our main line at [phone] for an update on the status of any delays or closures.  Dermatology Medication Tips: Please keep the boxes that topical medications come in in order to help keep track of the instructions about where and how to use these. Pharmacies typically print the medication instructions only on the boxes and not directly on the medication tubes.   If your medication is too expensive, please contact our office at [phone] option 4 or send [place] a message through [place].   We are unable to tell what your co-pay for medications will be in advance as this is different depending on your insurance coverage. However, we may be able to find a substitute medication at lower cost or fill out paperwork to get insurance to cover a needed medication.   If a prior authorization is required to get your medication covered by your insurance company, please allow [place] 1-2 business days to complete this process.  Drug prices often vary depending on where the prescription is filled and some pharmacies may offer cheaper prices.  The website www.goodrx.com contains coupons for medications through different pharmacies. The prices here do not account for what the cost may be with help from insurance (it may be cheaper with your insurance), but the website can give you the price if you did not use any insurance.  - You can print the associated coupon and take it with your prescription to the pharmacy.  - You may also stop by our office during regular business hours  and pick up a GoodRx coupon card.  - If you need your prescription sent electronically to a different pharmacy, notify our office through [facility] or by phone at [phone] option 4.   Recommend using Eucerin Roughness Relief cream.

## 2021-02-10 ENCOUNTER — Other Ambulatory Visit (HOSPITAL_COMMUNITY)
Admission: RE | Admit: 2021-02-10 | Discharge: 2021-02-10 | Disposition: A | Discharge status: Home / Self Care | Payer: 59 | Source: Ambulatory Visit | Attending: Family Medicine | Admitting: Family Medicine

## 2021-02-10 ENCOUNTER — Ambulatory Visit (INDEPENDENT_AMBULATORY_CARE_PROVIDER_SITE_OTHER): Payer: 59 | Admitting: Family Medicine

## 2021-02-10 ENCOUNTER — Other Ambulatory Visit: Payer: Self-pay

## 2021-02-10 ENCOUNTER — Encounter: Payer: Self-pay | Admitting: Family Medicine

## 2021-02-10 VITALS — BP 130/86 | HR 64 | Ht 66.0 in | Wt 162.0 lb

## 2021-02-10 DIAGNOSIS — Z1231 Encounter for screening mammogram for malignant neoplasm of breast: Secondary | ICD-10-CM

## 2021-02-10 DIAGNOSIS — F419 Anxiety disorder, unspecified: Secondary | ICD-10-CM

## 2021-02-10 DIAGNOSIS — Z0000 Encounter for general adult medical examination without abnormal findings: Secondary | ICD-10-CM | POA: Diagnosis not present

## 2021-02-10 MED ORDER — LORAZEPAM 0.5 MG PO TABS: 0.5000 mg | ORAL_TABLET | Freq: Two times a day (BID) | ORAL | 0 refills | Status: DC | PRN

## 2021-02-10 NOTE — Progress Notes (Signed)
BP 130/86   Pulse 64   Ht 5' 6"  (1.676 m)   Wt 162 lb (73.5 kg)   LMP 06/20/2016 (Approximate)   SpO2 98%   BMI 26.15 kg/m    Subjective:    Patient ID: Jamie [name], female    DOB: August 15, 1971, 50 y.o.   MRN: [id number]  HPI: Jamie [name] is a 50 y.o. female presenting on 02/10/2021 for comprehensive medical examination. Current medical complaints include:  ANXIETY/STRESS- her mother is having surgery on Monday. They think she has a mass and ?cancer. She has not been doing well. She has been crying and having panic attacks for the past couple of days. She has not talked to her psychiatrist. She is usually doing well, but this has made her feel terrible.   She currently lives with: parents Menopausal Symptoms: no  Depression Screen done today and results listed below:  Depression screen [facility] 2/9 02/10/2021 04/04/2020 01/26/2020 11/04/2019 09/14/2019  Decreased Interest 3 3 2 2 1   Down, Depressed, Hopeless 3 3 3 3 1   PHQ - 2 Score 6 6 5 5 2   Altered sleeping 3 3 3 3 3   Tired, decreased energy 3 2 2 2 3   Change in appetite 3 3 3 3 3   Feeling bad or failure about yourself  1 2 2 2 3   Trouble concentrating 3 2 3 3 2   Moving slowly or fidgety/restless 1 3 2 3 3   Suicidal thoughts 0 0 0 0 0  PHQ-9 Score 20 21 20 21 19   Difficult doing work/chores Not difficult at all - Somewhat difficult Somewhat difficult -  Some encounter information is confidential and restricted. Go to Review Flowsheets activity to see all data.  Some recent data might be hidden    Past Medical History:  Past Medical History:  Diagnosis Date   Anxiety    takes Ativan daily.  Panic Attack   Arthritis    Chronic back pain    herniated disc/stenosis/scoliosis   Depression    takes Effexor daily   Eczema    uses a cream daily as needed   Family history of adverse reaction to anesthesia    pta dad is very hard to wake up;excessive nausea   History of bronchitis    > [id number]   Hx of dysplastic nevus  05/10/2020   neck - posterior, Severe Atypia. Shave removal 07/18/2020, margins free   Hypoglycemia    Insomnia    takes Lorazepam nightly   Joint pain    PONV (postoperative nausea and vomiting)    Seasonal allergies    uses Flonase daily   Vitamin D deficiency    takes Vit D daily    Surgical History:  Past Surgical History:  Procedure Laterality Date   BACK SURGERY  2008/2012   laminectomy 1st time and 2nd time laminectomy and fusion   COLONOSCOPY WITH PROPOFOL N/A 05/27/2020   Procedure: COLONOSCOPY WITH PROPOFOL;  Surgeon: TVirgel Manifold MD;  Location: [facility] ENDOSCOPY;  Service: Gastroenterology;  Laterality: N/A;  COVID POSITIVE AUGUST 16   MAXIMUM ACCESS (MAS)POSTERIOR LUMBAR INTERBODY FUSION (PLIF) 2 LEVEL N/A 09/01/2014   Procedure: Lumbar four-five, Lumbar five-Sacral one Maximum Access Surgery posterior lumbar interbody fusion with interbody prosthesis posterior lateral arthrodesis posterior segmental instrumentation;  Surgeon: GElaina Hoops MD;  [facility];  Service: Neurosurgery;  Laterality: N/A;  Lumbar four-five, Lumbar five-Sacral one Maximum Access Surgery posterior lumbar interbody fusion with i    Medications:  Current  Outpatient Medications on File Prior to Visit  Medication Sig   albuterol (VENTOLIN HFA) 108 (90 Base) MCG/ACT inhaler Inhale 2 puffs into the lungs every 6 (six) hours as needed for wheezing or shortness of breath.   Ascorbic Acid (VITAMIN C PO) Take by mouth.   BOOSTRIX 5-2.5-18.5 LF-MCG/0.5 injection    busPIRone (BUSPAR) 10 MG tablet TAKE 1 TABLET (10 MG TOTAL) BY MOUTH 3 (THREE) TIMES DAILY.   cholecalciferol (VITAMIN D) 1000 UNITS tablet Take 1,000 Units by mouth daily.   COVID-19 mRNA vaccine, Pfizer, 30 MCG/0.3ML injection USE AS DIRECTED   fluticasone (FLONASE) 50 MCG/ACT nasal spray Place 1 spray into both nostrils daily as needed for allergies.    gabapentin (NEURONTIN) 600 MG tablet Take 600 mg by mouth 3 (three) times  daily.    gabapentin (NEURONTIN) 600 MG tablet TAKE 1 TABLET BY MOUTH 3 TIMES DAILY   gabapentin (NEURONTIN) 600 MG tablet TAKE 1 TABLET BY MOUTH 3 TIMES DAILY   gabapentin (NEURONTIN) 600 MG tablet TAKE 1 TABLET BY MOUTH THREE TIMES DAILY   halobetasol (ULTRAVATE) 0.05 % cream Apply topically 2 (two) times daily as needed. Up to 2 weeks. Avoid face, groin, underarms   Halobetasol Prop-Tazarotene (DUOBRII) 0.01-0.045 % LOTN Apply 1 application topically at bedtime. qhs to aa rash on hands and feet until clear, then prn flares, avoid face, groin, axilla   meloxicam (MOBIC) 7.5 MG tablet Take 7.5 mg by mouth 2 (two) times daily.   meloxicam (MOBIC) 7.5 MG tablet TAKE 1 TABLET BY MOUTH TWICE DAILY   meloxicam (MOBIC) 7.5 MG tablet TAKE 1 TABLET BY MOUTH 2 TIMES DAILY   meloxicam (MOBIC) 7.5 MG tablet TAKE 1 TABLET BY MOUTH TWO TIMES DAILY   metaxalone (SKELAXIN) 800 MG tablet Take 800 mg by mouth 3 (three) times daily.   metaxalone (SKELAXIN) 800 MG tablet TAKE 1 TABLET BY MOUTH 4 TIMES DAILY AS NEEDED   metaxalone (SKELAXIN) 800 MG tablet TAKE 1 TABLET BY MOUTH 4 TIMES DAILY AS NEEDED   metaxalone (SKELAXIN) 800 MG tablet TAKE 1 TABLET BY MOUTH FOUR TIMES DAILY AS NEEDED   montelukast (SINGULAIR) 10 MG tablet TAKE 1 TABLET BY MOUTH AT BEDTIME   morphine (KADIAN) 30 MG 24 hr capsule Take 30 mg by mouth daily.    morphine (MS CONTIN) 30 MG 12 hr tablet Take 30 mg by mouth every morning.    morphine (MS CONTIN) 30 MG 12 hr tablet TAKE 1 TABLET BY MOUTH EVERY MORNING   morphine (MS CONTIN) 30 MG 12 hr tablet TAKE 1 TABLET BY MOUTH EVERY AM (DNF 10/01/20). MAY FILL ON 09/30/20.   morphine (MS CONTIN) 30 MG 12 hr tablet TAKE 1 TABLET BY MOUTH ONCE DAILY EVERY MORNING   morphine (MS CONTIN) 30 MG 12 hr tablet take 1 tablet by mouth every morning   Multiple Vitamin (MULTIVITAMIN ADULT PO) Take by mouth daily.   Multiple Vitamins-Minerals (ZINC PO) Take by mouth.   oxyCODONE (ROXICODONE) 15 MG immediate  release tablet Take 15 mg by mouth every 6 (six) hours as needed.    oxyCODONE (ROXICODONE) 15 MG immediate release tablet TAKE 1 TABLET BY MOUTH EVERY 6 HOURS AS NEEDED FOR BREAKTHROUGH PAIN (DNF 12/26/20)   oxyCODONE (ROXICODONE) 15 MG immediate release tablet TAKE 1 TABLET BY MOUTH EVERY 6 HOURS AS NEEDED FOR BREAKTHROUGH PAIN (DNF 10/28/20)   oxyCODONE (ROXICODONE) 15 MG immediate release tablet TAKE 1 TABLET BY MOUTH EVERY 6 HOURS AS NEEDED FOR BREAKTHROUGH PAIN  oxyCODONE (ROXICODONE) 15 MG immediate release tablet take 1 tablet by mouth every 6 hours as needed for breakthrough pain   PARoxetine (PAXIL) 20 MG tablet TAKE 1 & 1/2 TABLETS (30 MG) BY MOUTH DAILY.   PFIZER-BIONTECH COVID-19 VACC 30 MCG/0.3ML injection    propranolol (INDERAL) 20 MG tablet TAKE 1 TABLET (20 MG TOTAL) BY MOUTH 2 (TWO) TIMES DAILY AS NEEDED FOR SEVERE PANIC ATTACKS   QUEtiapine (SEROQUEL) 25 MG tablet Take 1 tablet (25 mg total) by mouth at bedtime.   Secukinumab, 300 MG Dose, (COSENTYX SENSOREADY, 300 MG,) 150 MG/ML SOAJ Inject 300 mg into the skin every 28 (twenty-eight) days. For maintenance.   Secukinumab, 300 MG Dose, (COSENTYX SENSOREADY, 300 MG,) 150 MG/ML SOAJ Inject 300 mg into the skin as directed. On week 0, 1, 2, 3 and 4.   Secukinumab, 300 MG Dose, (COSENTYX SENSOREADY, 300 MG,) 150 MG/ML SOAJ Inject 300 mg into the skin as directed on week 0, 1, 2, 3 and 4   Secukinumab, 300 MG Dose, (COSENTYX, 300 MG DOSE,) 150 MG/ML SOSY Inject 300 mg into the skin every 28 days for maintenance   triamcinolone cream (KENALOG) 0.1 % Apply 1 application topically 2 (two) times daily.   No current facility-administered medications on file prior to visit.    Allergies:  Allergies  Allergen Reactions   Shellfish Allergy Hives   Flexeril [Cyclobenzaprine] Hives    RAPID HEARTBEAT    Social History:  Social History   Socioeconomic History   Marital status: Divorced    Spouse name: Not on file   Number of  children: Not on file   Years of education: Not on file   Highest education level: Bachelor's degree (e.g., BA, AB, BS)  Occupational History   Not on file  Tobacco Use   Smoking status: Former    Packs/day: 0.50    Years: 20.00    Pack years: 10.00    Types: Cigarettes    Quit date: 10/18/2016    Years since quitting: 4.3   Smokeless tobacco: Never  Vaping Use   Vaping Use: Never used  Substance and Sexual Activity   Alcohol use: No   Drug use: No   Sexual activity: Not Currently  Other Topics Concern   Not on file  Social History Narrative   Not on file   Social Determinants of Health   Financial Resource Strain: Not on file  Food Insecurity: Not on file  Transportation Needs: Not on file  Physical Activity: Not on file  Stress: Not on file  Social Connections: Not on file  Intimate Partner Violence: Not on file   Social History   Tobacco Use  Smoking Status Former   Packs/day: 0.50   Years: 20.00   Pack years: 10.00   Types: Cigarettes   Quit date: 10/18/2016   Years since quitting: 4.3  Smokeless Tobacco Never   Social History   Substance and Sexual Activity  Alcohol Use No    Family History:  Family History  Problem Relation Age of Onset   Arthritis Mother    Hyperlipidemia Mother    Migraines Mother    Arthritis Father    Diabetes Father    Heart disease Father    Diabetes Brother    Breast cancer Maternal Grandmother    Mental illness Neg Hx     Past medical history, surgical history, medications, allergies, family history and social history reviewed with patient today and changes made to appropriate areas of the  chart.   Review of Systems  Constitutional: Negative.   HENT: Negative.    Eyes: Negative.   Respiratory: Negative.    Cardiovascular: Negative.   Gastrointestinal: Negative.   Genitourinary: Negative.   Musculoskeletal:  Positive for back pain and myalgias. Negative for falls, joint pain and neck pain.  Skin: Negative.    Neurological:  Positive for tingling and weakness. Negative for dizziness, tremors, sensory change, speech change, focal weakness, seizures, loss of consciousness and headaches.  Endo/Heme/Allergies: Negative.   Psychiatric/Behavioral:  Negative for depression, hallucinations, memory loss, substance abuse and suicidal ideas. The patient is nervous/anxious. The patient does not have insomnia.   All other ROS negative except what is listed above and in the HPI.      Objective:    BP 130/86   Pulse 64   Ht 5' 6"  (1.676 m)   Wt 162 lb (73.5 kg)   LMP 06/20/2016 (Approximate)   SpO2 98%   BMI 26.15 kg/m   Wt Readings from Last 3 Encounters:  02/10/21 162 lb (73.5 kg)  05/27/20 150 lb (68 kg)  04/04/20 157 lb (71.2 kg)    Physical Exam Vitals and nursing note reviewed.  Constitutional:      General: She is not in acute distress.    Appearance: Normal appearance. She is not ill-appearing, toxic-appearing or diaphoretic.  HENT:     Head: Normocephalic and atraumatic.     Right Ear: Tympanic membrane, ear canal and external ear normal. There is no impacted cerumen.     Left Ear: Tympanic membrane, ear canal and external ear normal. There is no impacted cerumen.     Nose: Nose normal. No congestion or rhinorrhea.     Mouth/Throat:     Mouth: Mucous membranes are moist.     Pharynx: Oropharynx is clear. No oropharyngeal exudate or posterior oropharyngeal erythema.  Eyes:     General: No scleral icterus.       Right eye: No discharge.        Left eye: No discharge.     Extraocular Movements: Extraocular movements intact.     Conjunctiva/sclera: Conjunctivae normal.     Pupils: Pupils are equal, round, and reactive to light.  Neck:     Vascular: No carotid bruit.  Cardiovascular:     Rate and Rhythm: Normal rate and regular rhythm.     Pulses: Normal pulses.     Heart sounds: No murmur heard.   No friction rub. No gallop.  Pulmonary:     Effort: Pulmonary effort is normal. No  respiratory distress.     Breath sounds: Normal breath sounds. No stridor. No wheezing, rhonchi or rales.  Chest:     Chest wall: No tenderness.  Abdominal:     General: Abdomen is flat. Bowel sounds are normal. There is no distension.     Palpations: Abdomen is soft. There is no mass.     Tenderness: There is no abdominal tenderness. There is no right CVA tenderness, left CVA tenderness, guarding or rebound.     Hernia: No hernia is present.  Genitourinary:    General: Normal vulva.     Exam position: Knee-chest position.     Labia:        Right: No rash, tenderness, lesion or injury.        Left: No rash, tenderness, lesion or injury.      Vagina: Normal.     Cervix: Normal.     Uterus: Normal.  Adnexa: Right adnexa normal and left adnexa normal.     Comments: Breast exams deferred with shared decision making Musculoskeletal:        General: No swelling, tenderness, deformity or signs of injury.     Cervical back: Normal range of motion and neck supple. No rigidity. No muscular tenderness.     Right lower leg: No edema.     Left lower leg: No edema.  Lymphadenopathy:     Cervical: No cervical adenopathy.  Skin:    General: Skin is warm and dry.     Capillary Refill: Capillary refill takes less than 2 seconds.     Coloration: Skin is not jaundiced or pale.     Findings: No bruising, erythema, lesion or rash.  Neurological:     General: No focal deficit present.     Mental Status: She is alert and oriented to person, place, and time. Mental status is at baseline.     Cranial Nerves: No cranial nerve deficit.     Sensory: No sensory deficit.     Motor: No weakness.     Coordination: Coordination normal.     Gait: Gait normal.     Deep Tendon Reflexes: Reflexes normal.  Psychiatric:        Mood and Affect: Mood is anxious and depressed. Affect is tearful.        Behavior: Behavior normal.        Thought Content: Thought content normal.        Judgment: Judgment normal.     Results for orders placed or performed in visit on 02/10/21  CBC with Differential/Platelet  Result Value Ref Range   WBC 7.8 3.4 - 10.8 x10E3/uL   RBC 3.83 3.77 - 5.28 x10E6/uL   Hemoglobin 12.6 11.1 - 15.9 g/dL   Hematocrit 37.3 34.0 - 46.6 %   MCV 97 79 - 97 fL   MCH 32.9 26.6 - 33.0 pg   MCHC 33.8 31.5 - 35.7 g/dL   RDW 12.0 11.7 - 15.4 %   Platelets 273 150 - 450 x10E3/uL   Neutrophils 39 Not Estab. %   Lymphs 52 Not Estab. %   Monocytes 8 Not Estab. %   Eos 1 Not Estab. %   Basos 0 Not Estab. %   Neutrophils Absolute 3.1 1.4 - 7.0 x10E3/uL   Lymphocytes Absolute 4.0 (H) 0.7 - 3.1 x10E3/uL   Monocytes Absolute 0.6 0.1 - 0.9 x10E3/uL   EOS (ABSOLUTE) 0.1 0.0 - 0.4 x10E3/uL   Basophils Absolute 0.0 0.0 - 0.2 x10E3/uL   Immature Granulocytes 0 Not Estab. %   Immature Grans (Abs) 0.0 0.0 - 0.1 x10E3/uL  Comprehensive metabolic panel  Result Value Ref Range   Glucose 102 (H) 65 - 99 mg/dL   BUN 10 6 - 24 mg/dL   Creatinine, Ser 0.68 0.57 - 1.00 mg/dL   eGFR 105 >59 mL/min/1.73   BUN/Creatinine Ratio 15 9 - 23   Sodium 142 134 - 144 mmol/L   Potassium 4.2 3.5 - 5.2 mmol/L   Chloride 106 96 - 106 mmol/L   CO2 23 20 - 29 mmol/L   Calcium 9.4 8.7 - 10.2 mg/dL   Total Protein 6.6 6.0 - 8.5 g/dL   Albumin 4.5 3.8 - 4.9 g/dL   Globulin, Total 2.1 1.5 - 4.5 g/dL   Albumin/Globulin Ratio 2.1 1.2 - 2.2   Bilirubin Total <0.2 0.0 - 1.2 mg/dL   Alkaline Phosphatase 74 44 - 121 IU/L   AST 17  0 - 40 IU/L   ALT 11 0 - 32 IU/L  Lipid Panel w/o Chol/HDL Ratio  Result Value Ref Range   Cholesterol, Total 197 100 - 199 mg/dL   Triglycerides 173 (H) 0 - 149 mg/dL   HDL 46 >39 mg/dL   VLDL Cholesterol Cal 31 5 - 40 mg/dL   LDL Chol Calc (NIH) 120 (H) 0 - 99 mg/dL  Urinalysis, Routine w reflex microscopic  Result Value Ref Range   Specific Gravity, UA >1.030 (H) 1.005 - 1.030   pH, UA 5.0 5.0 - 7.5   Color, UA Yellow Yellow   Appearance Ur Clear Clear   Leukocytes,UA Negative  Negative   Protein,UA Negative Negative/Trace   Glucose, UA Negative Negative   Ketones, UA Negative Negative   RBC, UA Negative Negative   Bilirubin, UA Negative Negative   Urobilinogen, Ur 0.2 0.2 - 1.0 mg/dL   Nitrite, UA Negative Negative  TSH  Result Value Ref Range   TSH 0.616 0.450 - 4.500 uIU/mL      Assessment & Plan:   Problem List Items Addressed This Visit   None Visit Diagnoses     Routine general medical examination at a health care facility    -  Primary   Vaccines up to date. Screening labs checked today. Pap done. Mammogram odered. Colonoscopy up to date. Continue diet and exercise. Call with concerns.    Relevant Orders   CBC with Differential/Platelet (Completed)   Comprehensive metabolic panel (Completed)   Lipid Panel w/o Chol/HDL Ratio (Completed)   Cytology - PAP   Urinalysis, Routine w reflex microscopic (Completed)   TSH (Completed)   Acute anxiety       In acute exacerbation. Attempted to contact pain managment but they were closed. Will send 6 tabs of ativan. Not to be taken within 8 hours of her pain medicine   Relevant Medications   LORazepam (ATIVAN) 0.5 MG tablet   Encounter for screening mammogram for malignant neoplasm of breast       Mammogram ordered today        Follow up plan: Return in about 1 year (around 02/10/2022) for physical.   LABORATORY TESTING:  - Pap smear: pap done  IMMUNIZATIONS:   - Tdap: Tetanus vaccination status reviewed: last tetanus booster within 10 years. - Influenza: Up to date - Pneumovax: Not applicable - Prevnar: Not applicable - COVID Up to date - Zostavax vaccine: Not applicable  SCREENING: -Mammogram: Ordered today  - Colonoscopy: Up to date  - Bone Density: Not applicable   PATIENT COUNSELING:   Advised to take 1 mg of folate supplement per day if capable of pregnancy.   Sexuality: Discussed sexually transmitted diseases, partner selection, use of condoms, avoidance of unintended pregnancy   and contraceptive alternatives.   Advised to avoid cigarette smoking.  I discussed with the patient that most people either abstain from alcohol or drink within safe limits (<=14/week and <=4 drinks/occasion for males, <=7/weeks and <= 3 drinks/occasion for females) and that the risk for alcohol disorders and other health effects rises proportionally with the number of drinks per week and how often a drinker exceeds daily limits.  Discussed cessation/primary prevention of drug use and availability of treatment for abuse.   Diet: Encouraged to adjust caloric intake to maintain  or achieve ideal body weight, to reduce intake of dietary saturated fat and total fat, to limit sodium intake by avoiding high sodium foods and not adding table salt, and to maintain  adequate dietary potassium and calcium preferably from fresh fruits, vegetables, and low-fat dairy products.    stressed the importance of regular exercise  Injury prevention: Discussed safety belts, safety helmets, smoke detector, smoking near bedding or upholstery.   Dental health: Discussed importance of regular tooth brushing, flossing, and dental visits.    NEXT PREVENTATIVE PHYSICAL DUE IN 1 YEAR. Return in about 1 year (around 02/10/2022) for physical.

## 2021-02-11 LAB — COMPREHENSIVE METABOLIC PANEL
ALT: 11 IU/L (ref 0–32)
AST: 17 IU/L (ref 0–40)
Albumin/Globulin Ratio: 2.1 (ref 1.2–2.2)
Albumin: 4.5 g/dL (ref 3.8–4.9)
Alkaline Phosphatase: 74 IU/L (ref 44–121)
BUN/Creatinine Ratio: 15 (ref 9–23)
BUN: 10 mg/dL (ref 6–24)
Bilirubin Total: <0.2 mg/dL (ref 0.0–1.2)
CO2: 23 mmol/L (ref 20–29)
Calcium: 9.4 mg/dL (ref 8.7–10.2)
Chloride: 106 mmol/L (ref 96–106)
Creatinine, Ser: 0.68 mg/dL (ref 0.57–1.00)
Globulin, Total: 2.1 g/dL (ref 1.5–4.5)
Glucose: 102 mg/dL — ABNORMAL HIGH (ref 65–99)
Potassium: 4.2 mmol/L (ref 3.5–5.2)
Sodium: 142 mmol/L (ref 134–144)
Total Protein: 6.6 g/dL (ref 6.0–8.5)
eGFR: 105 mL/min/{1.73_m2} (ref >59)

## 2021-02-11 LAB — CBC WITH DIFFERENTIAL/PLATELET
Basophils Absolute: 0 10*3/uL (ref 0.0–0.2)
Basos: 0 %
EOS (ABSOLUTE): 0.1 10*3/uL (ref 0.0–0.4)
Eos: 1 %
Hematocrit: 37.3 % (ref 34.0–46.6)
Hemoglobin: 12.6 g/dL (ref 11.1–15.9)
Immature Grans (Abs): 0 10*3/uL (ref 0.0–0.1)
Immature Granulocytes: 0 %
Lymphocytes Absolute: 4 10*3/uL — ABNORMAL HIGH (ref 0.7–3.1)
Lymphs: 52 %
MCH: 32.9 pg (ref 26.6–33.0)
MCHC: 33.8 g/dL (ref 31.5–35.7)
MCV: 97 fL (ref 79–97)
Monocytes Absolute: 0.6 10*3/uL (ref 0.1–0.9)
Monocytes: 8 %
Neutrophils Absolute: 3.1 10*3/uL (ref 1.4–7.0)
Neutrophils: 39 %
Platelets: 273 10*3/uL (ref 150–450)
RBC: 3.83 x10E6/uL (ref 3.77–5.28)
RDW: 12 % (ref 11.7–15.4)
WBC: 7.8 10*3/uL (ref 3.4–10.8)

## 2021-02-11 LAB — LIPID PANEL W/O CHOL/HDL RATIO
Cholesterol, Total: 197 mg/dL (ref 100–199)
HDL: 46 mg/dL (ref >39)
LDL Chol Calc (NIH): 120 mg/dL — ABNORMAL HIGH (ref 0–99)
Triglycerides: 173 mg/dL — ABNORMAL HIGH (ref 0–149)
VLDL Cholesterol Cal: 31 mg/dL (ref 5–40)

## 2021-02-11 LAB — URINALYSIS, ROUTINE W REFLEX MICROSCOPIC
Bilirubin, UA: NEGATIVE
Glucose, UA: NEGATIVE
Ketones, UA: NEGATIVE
Leukocytes,UA: NEGATIVE
Nitrite, UA: NEGATIVE
Protein,UA: NEGATIVE
RBC, UA: NEGATIVE
Specific Gravity, UA: >1.03 — ABNORMAL HIGH (ref 1.005–1.030)
Urobilinogen, Ur: 0.2 mg/dL (ref 0.2–1.0)
pH, UA: 5 (ref 5.0–7.5)

## 2021-02-11 LAB — TSH: TSH: 0.616 u[IU]/mL (ref 0.450–4.500)

## 2021-02-12 ENCOUNTER — Encounter: Payer: Self-pay | Admitting: Family Medicine

## 2021-02-13 ENCOUNTER — Other Ambulatory Visit (HOSPITAL_COMMUNITY): Payer: Self-pay

## 2021-02-15 LAB — CYTOLOGY - PAP
Comment: NEGATIVE
Diagnosis: NEGATIVE
High risk HPV: NEGATIVE

## 2021-02-16 ENCOUNTER — Other Ambulatory Visit: Payer: Self-pay | Admitting: Psychiatry

## 2021-02-16 ENCOUNTER — Other Ambulatory Visit: Payer: Self-pay

## 2021-02-16 ENCOUNTER — Other Ambulatory Visit (HOSPITAL_COMMUNITY): Payer: Self-pay

## 2021-02-16 DIAGNOSIS — F411 Generalized anxiety disorder: Secondary | ICD-10-CM

## 2021-02-16 MED FILL — Propranolol HCl Tab 20 MG: ORAL | 30 days supply | Qty: 60 | Fill #0 | Status: AC

## 2021-02-16 MED FILL — Paroxetine HCl Tab 20 MG: ORAL | 30 days supply | Qty: 45 | Fill #1 | Status: AC

## 2021-02-16 MED FILL — Buspirone HCl Tab 10 MG: ORAL | 30 days supply | Qty: 90 | Fill #0 | Status: AC

## 2021-02-17 ENCOUNTER — Other Ambulatory Visit (HOSPITAL_COMMUNITY): Payer: Self-pay

## 2021-02-17 ENCOUNTER — Other Ambulatory Visit: Payer: Self-pay

## 2021-02-21 ENCOUNTER — Other Ambulatory Visit: Payer: Self-pay

## 2021-03-08 ENCOUNTER — Telehealth: Payer: Self-pay

## 2021-03-08 ENCOUNTER — Other Ambulatory Visit: Payer: Self-pay

## 2021-03-08 DIAGNOSIS — L409 Psoriasis, unspecified: Secondary | ICD-10-CM

## 2021-03-08 MED ORDER — DUOBRII 0.01-0.045 % EX LOTN: 1.0000 "application " | TOPICAL_LOTION | Freq: Every day | CUTANEOUS | 1 refills | Status: AC

## 2021-03-08 NOTE — Telephone Encounter (Signed)
Spoke with patient this morning. Something happened within [employer] and Covered until your Covered system and patient's monthly RF was denied. This will be corrected and fixed but patient would not have medication until next week possibly. She is due this week and having a current flare. Sample given to make sure patient does not get off of therapy.   LOT: [id number] EXP: 05/2022

## 2021-03-08 NOTE — Progress Notes (Signed)
New RF to [place].

## 2021-03-10 ENCOUNTER — Other Ambulatory Visit: Payer: Self-pay

## 2021-03-10 ENCOUNTER — Encounter: Payer: Self-pay | Admitting: Family Medicine

## 2021-03-10 ENCOUNTER — Telehealth (INDEPENDENT_AMBULATORY_CARE_PROVIDER_SITE_OTHER): Payer: 59 | Admitting: Family Medicine

## 2021-03-10 DIAGNOSIS — R197 Diarrhea, unspecified: Secondary | ICD-10-CM

## 2021-03-10 MED ORDER — ONDANSETRON 4 MG PO TBDP
4.0000 mg | ORAL_TABLET | Freq: Three times a day (TID) | ORAL | 0 refills | Status: DC | PRN
  Filled 2021-03-10: qty 20, 7d supply, fill #0

## 2021-03-10 NOTE — Progress Notes (Signed)
LMP 06/20/2016 (Approximate)    Subjective:    Patient ID: Jamie [name], female    DOB: 1971/05/06, 50 y.o.   MRN: [id number]  HPI: Jamie [name] is a 50 y.o. female  Chief Complaint  Patient presents with   Diarrhea    Bloating nausea started on Wednesday, has gotten worse, she still is having some cramps bloating and diarrhea   ABDOMINAL ISSUES Duration: 2 days Petra Kuba: bloating Location: diffuse  Severity: moderate  Radiation: no Frequency: intermittent Treatments attempted:  gas-x Constipation: no Diarrhea: yes Episodes of diarrhea/day: 5-6x a day Mucous in the stool: no Heartburn: no Bloating:yes Flatulence: yes Nausea: yes Vomiting: no Melena or hematochezia: no Rash: no Jaundice: no Fever: no Weight loss: no   Relevant past medical, surgical, family and social history reviewed and updated as indicated. Interim medical history since our last visit reviewed. Allergies and medications reviewed and updated.  Review of Systems  Constitutional:  Positive for chills. Negative for activity change, appetite change, diaphoresis, fatigue, fever and unexpected weight change.  Respiratory: Negative.    Cardiovascular: Negative.   Gastrointestinal:  Positive for abdominal distention, diarrhea and nausea. Negative for abdominal pain, anal bleeding, blood in stool, constipation, rectal pain and vomiting.  Musculoskeletal: Negative.   Skin: Negative.   Psychiatric/Behavioral: Negative.     Per HPI unless specifically indicated above     Objective:    LMP 06/20/2016 (Approximate)   Wt Readings from Last 3 Encounters:  02/10/21 162 lb (73.5 kg)  05/27/20 150 lb (68 kg)  04/04/20 157 lb (71.2 kg)    Physical Exam Vitals and nursing note reviewed.  Constitutional:      General: She is not in acute distress.    Appearance: Normal appearance. She is not ill-appearing, toxic-appearing or diaphoretic.  HENT:     Head: Normocephalic and atraumatic.     Right Ear:  External ear normal.     Left Ear: External ear normal.     Nose: Nose normal.     Mouth/Throat:     Mouth: Mucous membranes are moist.     Pharynx: Oropharynx is clear.  Eyes:     General: No scleral icterus.       Right eye: No discharge.        Left eye: No discharge.     Conjunctiva/sclera: Conjunctivae normal.     Pupils: Pupils are equal, round, and reactive to light.  Pulmonary:     Effort: Pulmonary effort is normal. No respiratory distress.     Comments: Speaking in full sentences Musculoskeletal:        General: Normal range of motion.     Cervical back: Normal range of motion.  Skin:    Coloration: Skin is not jaundiced or pale.     Findings: No bruising, erythema, lesion or rash.  Neurological:     Mental Status: She is alert and oriented to person, place, and time. Mental status is at baseline.  Psychiatric:        Mood and Affect: Mood normal.        Behavior: Behavior normal.        Thought Content: Thought content normal.        Judgment: Judgment normal.    Results for orders placed or performed in visit on 02/10/21  CBC with Differential/Platelet  Result Value Ref Range   WBC 7.8 3.4 - 10.8 x10E3/uL   RBC 3.83 3.77 - 5.28 x10E6/uL   Hemoglobin 12.6 11.1 - 15.9 g/dL  Hematocrit 37.3 34.0 - 46.6 %   MCV 97 79 - 97 fL   MCH 32.9 26.6 - 33.0 pg   MCHC 33.8 31.5 - 35.7 g/dL   RDW 12.0 11.7 - 15.4 %   Platelets 273 150 - 450 x10E3/uL   Neutrophils 39 Not Estab. %   Lymphs 52 Not Estab. %   Monocytes 8 Not Estab. %   Eos 1 Not Estab. %   Basos 0 Not Estab. %   Neutrophils Absolute 3.1 1.4 - 7.0 x10E3/uL   Lymphocytes Absolute 4.0 (H) 0.7 - 3.1 x10E3/uL   Monocytes Absolute 0.6 0.1 - 0.9 x10E3/uL   EOS (ABSOLUTE) 0.1 0.0 - 0.4 x10E3/uL   Basophils Absolute 0.0 0.0 - 0.2 x10E3/uL   Immature Granulocytes 0 Not Estab. %   Immature Grans (Abs) 0.0 0.0 - 0.1 x10E3/uL  Comprehensive metabolic panel  Result Value Ref Range   Glucose 102 (H) 65 - 99 mg/dL    BUN 10 6 - 24 mg/dL   Creatinine, Ser 0.68 0.57 - 1.00 mg/dL   eGFR 105 >59 mL/min/1.73   BUN/Creatinine Ratio 15 9 - 23   Sodium 142 134 - 144 mmol/L   Potassium 4.2 3.5 - 5.2 mmol/L   Chloride 106 96 - 106 mmol/L   CO2 23 20 - 29 mmol/L   Calcium 9.4 8.7 - 10.2 mg/dL   Total Protein 6.6 6.0 - 8.5 g/dL   Albumin 4.5 3.8 - 4.9 g/dL   Globulin, Total 2.1 1.5 - 4.5 g/dL   Albumin/Globulin Ratio 2.1 1.2 - 2.2   Bilirubin Total <0.2 0.0 - 1.2 mg/dL   Alkaline Phosphatase 74 44 - 121 IU/L   AST 17 0 - 40 IU/L   ALT 11 0 - 32 IU/L  Lipid Panel w/o Chol/HDL Ratio  Result Value Ref Range   Cholesterol, Total 197 100 - 199 mg/dL   Triglycerides 173 (H) 0 - 149 mg/dL   HDL 46 >39 mg/dL   VLDL Cholesterol Cal 31 5 - 40 mg/dL   LDL Chol Calc (NIH) 120 (H) 0 - 99 mg/dL  Urinalysis, Routine w reflex microscopic  Result Value Ref Range   Specific Gravity, UA >1.030 (H) 1.005 - 1.030   pH, UA 5.0 5.0 - 7.5   Color, UA Yellow Yellow   Appearance Ur Clear Clear   Leukocytes,UA Negative Negative   Protein,UA Negative Negative/Trace   Glucose, UA Negative Negative   Ketones, UA Negative Negative   RBC, UA Negative Negative   Bilirubin, UA Negative Negative   Urobilinogen, Ur 0.2 0.2 - 1.0 mg/dL   Nitrite, UA Negative Negative  TSH  Result Value Ref Range   TSH 0.616 0.450 - 4.500 uIU/mL  Cytology - PAP  Result Value Ref Range   High risk HPV Negative    Adequacy      Satisfactory for evaluation; transformation zone component PRESENT.   Diagnosis      - Negative for intraepithelial lesion or malignancy (NILM)   Comment Normal Reference Range HPV - Negative       Assessment & Plan:   Problem List Items Addressed This Visit   None Visit Diagnoses     Diarrhea, unspecified type    -  Primary   BRAT diet and zofran PRN. Call if not better by Monday and we'll check labs. Continue to monitor. Call with any concerns.         Follow up plan: Return if symptoms worsen or fail to  improve.  This visit was completed via video visit through MyChart due to the restrictions of the COVID-19 pandemic. All issues as above were discussed and addressed. Physical exam was done as above through visual confirmation on video through MyChart. If it was felt that the patient should be evaluated in the office, they were directed there. The patient verbally consented to this visit. Location of the patient: home Location of the provider: work Those involved with this call:  Provider: Park Liter, DO CMA: Gerrit Halls, CMA Front Desk/Registration: Jill Side  Time spent on call:  15 minutes with patient face to face via video conference. More than 50% of this time was spent in counseling and coordination of care. 30 minutes total spent in review of patient's record and preparation of their chart.

## 2021-03-13 DIAGNOSIS — M5441 Lumbago with sciatica, right side: Secondary | ICD-10-CM | POA: Diagnosis not present

## 2021-03-13 DIAGNOSIS — G8929 Other chronic pain: Secondary | ICD-10-CM | POA: Diagnosis not present

## 2021-03-13 DIAGNOSIS — M5442 Lumbago with sciatica, left side: Secondary | ICD-10-CM | POA: Diagnosis not present

## 2021-03-14 ENCOUNTER — Encounter: Payer: Self-pay | Admitting: Dermatology

## 2021-03-15 ENCOUNTER — Ambulatory Visit (INDEPENDENT_AMBULATORY_CARE_PROVIDER_SITE_OTHER): Payer: 59 | Admitting: Dermatology

## 2021-03-15 ENCOUNTER — Other Ambulatory Visit: Payer: Self-pay

## 2021-03-15 DIAGNOSIS — L409 Psoriasis, unspecified: Secondary | ICD-10-CM

## 2021-03-15 MED ORDER — DOXYCYCLINE MONOHYDRATE 100 MG PO CAPS
100.0000 mg | ORAL_CAPSULE | Freq: Two times a day (BID) | ORAL | 0 refills | Status: AC
  Filled 2021-03-15: qty 20, 10d supply, fill #0

## 2021-03-15 MED ORDER — CLOBETASOL PROPIONATE 0.05 % EX CREA
TOPICAL_CREAM | CUTANEOUS | 1 refills | Status: DC
  Filled 2021-03-15: qty 60, 14d supply, fill #0

## 2021-03-15 MED ORDER — CLINDAMYCIN PHOSPHATE 1 % EX LOTN
TOPICAL_LOTION | CUTANEOUS | 1 refills | Status: DC
  Filled 2021-03-15: qty 60, 14d supply, fill #0

## 2021-03-15 MED ORDER — HYDROXYZINE HCL 10 MG PO TABS
ORAL_TABLET | ORAL | 1 refills | Status: DC
Start: 2021-03-15 — End: 2022-02-23
  Filled 2021-03-15: qty 90, 30d supply, fill #0

## 2021-03-15 NOTE — Progress Notes (Signed)
Follow-Up Visit   Subjective  Jamie [name] is a 50 y.o. female who presents for the following: Psoriasis (Flared. Patient started monthly Cosentyx injections last week and she uses topical Duobrii lotion. Monday night patient's psoriasis flared and spread to arm, scalp, buttocks. Patient normally only has psoriasis on palms and soles. ). No recent sickness or recent increased stress. This rash seems different than her psoriasis.  Very itchy. No side effects from Cosentyx.  Was working well until now.   The following portions of the chart were reviewed this encounter and updated as appropriate:       Review of Systems:  No other skin or systemic complaints except as noted in HPI or Assessment and Plan.  Objective  Well appearing patient in no apparent distress; mood and affect are within normal limits.  A focused examination was performed including face, hands, feet, buttocks. Relevant physical exam findings are noted in the Assessment and Plan.  palms, fingers, buttocks Diffuse erythema and hyperkeratosis of the palms and fingers; deep-seated pustules on palm, fingers; multiple pink excoriated papules on the buttocks L > R   Assessment & Plan  Psoriasis palms, fingers, buttocks  Psoriasis flare vs Folliculitis  Psoriasis - severe on systemic "biologic" treatment injections.  Psoriasis is a chronic non-curable, but treatable genetic/hereditary disease that may have other systemic features affecting other organ systems such as joints (Psoriatic Arthritis).  It is linked with heart disease, inflammatory bowel disease, non-alcoholic fatty liver disease, and depression. Significant skin psoriasis and/or psoriatic arthritis may have significant symptoms and affects activities of daily activity and often benefits from systemic "biologic" injection treatments.  These "biologic" treatments have some potential side effects including immunosuppression and require pre-treatment laboratory  screening and periodic laboratory monitoring and periodic in person evaluation and monitoring by the attending dermatologist physician.   Continue Cosentyx 300 mg SQ monthly Start doxycycline monohydrate take 1 po BID with food x 10 days dsp #20 0Rf Start clindamycin lotion Apply to Aas qd/bid until improved dsp 99m 1Rf. Start clobetasol cream Apply to AA qd/bid AA until improved dsp 60g 1Rf. Avoid face, groin, axilla. Start hydroxyzine '10mg'$  take 1-3 po qhs dsp #90 1Rf.   d/c Duobrii lotion for now.   Doxycycline should be taken with food to prevent nausea. Do not lay down for 30 minutes after taking. Be cautious with sun exposure and use good sun protection while on this medication. Pregnant women should not take this medication.   Topical steroids (such as triamcinolone, fluocinolone, fluocinonide, mometasone, clobetasol, halobetasol, betamethasone, hydrocortisone) can cause thinning and lightening of the skin if they are used for too long in the same area. Your physician has selected the right strength medicine for your problem and area affected on the body. Please use your medication only as directed by your physician to prevent side effects.   Reviewed risks of biologics including immunosuppression, infections, injection site reaction, and failure to improve condition. Goal is control of skin condition, not cure.  Some older biologics such as Humira and Enbrel may slightly increase risk of malignancy and may worsen congestive heart failure. The use of biologics requires long term medication management, including periodic office visits and monitoring of blood work.   doxycycline (MONODOX) 100 MG capsule - palms, fingers, buttocks Take 1 capsule (100 mg total) by mouth 2 (two) times daily for 10 days.  clindamycin (CLEOCIN T) 1 % lotion - palms, fingers, buttocks Apply to affected areas 1-2 times a day until rash improved.  clobetasol cream (TEMOVATE) 0.05 % - palms, fingers,  buttocks Apply to affected areas 1-2 times a day until rash improved. Avoid face, groin, axilla.  hydrOXYzine (ATARAX/VISTARIL) 10 MG tablet - palms, fingers, buttocks Take 1-3 tablets by mouth every night as needed for itch.  Related Medications Halobetasol Prop-Tazarotene (DUOBRII) 0.01-0.045 % LOTN Apply 1 application topically at bedtime. qhs to aa rash on hands and feet until clear, then prn flares, avoid face, groin, axilla  Return as scheduled.  IJamesetta Orleans, CMA, am acting as scribe for [name], MD .  Documentation: I have reviewed the above documentation for accuracy and completeness, and I agree with the above.  [name] MD

## 2021-03-15 NOTE — Patient Instructions (Addendum)
Doxycycline should be taken with food to prevent nausea. Do not lay down for 30 minutes after taking. Be cautious with sun exposure and use good sun protection while on this medication. Pregnant women should not take this medication.   Topical retinoid medications like tretinoin/Retin-A, adapalene/Differin, tazarotene/Fabior, and Epiduo/Epiduo Forte can cause dryness and irritation when first started. Only apply a pea-sized amount to the entire affected area. Avoid applying it around the eyes, edges of mouth and creases at the nose. If you experience irritation, use a good moisturizer first and/or apply the medicine less often. If you are doing well with the medicine, you can increase how often you use it until you are applying every night. Be careful with sun protection while using this medication as it can make you sensitive to the sun. This medicine should not be used by pregnant women.   Reviewed risks of biologics including immunosuppression, infections, injection site reaction, and failure to improve condition. Goal is control of skin condition, not cure.  Some older biologics such as Humira and Enbrel may slightly increase risk of malignancy and may worsen congestive heart failure. The use of biologics requires long term medication management, including periodic office visits and monitoring of blood work.  If you have any questions or concerns for your doctor, please call our main line at [phone] and press option 4 to reach your doctor's medical assistant. If no one answers, please leave a voicemail as directed and we will return your call as soon as possible. Messages left after 4 pm will be answered the following business day.   You may also send [place] a message via [place]. We typically respond to MyChart messages within 1-2 business days.  For prescription refills, please ask your pharmacy to contact our office. Our fax number is [phone].  If you have an urgent issue when the clinic  is closed that cannot wait until the next business day, you can page your doctor at the number below.    Please note that while we do our best to be available for urgent issues outside of office hours, we are not available 24/7.   If you have an urgent issue and are unable to reach [place], you may choose to seek medical care at your doctor's office, retail clinic, urgent care center, or emergency room.  If you have a medical emergency, please immediately call 911 or go to the emergency department.  Pager Numbers  - Dr. Nehemiah Massed: [phone]  - Dr. Laurence Ferrari: [phone]  - Dr. Nicole Kindred: [phone]  In the event of inclement weather, please call our main line at [phone] for an update on the status of any delays or closures.  Dermatology Medication Tips: Please keep the boxes that topical medications come in in order to help keep track of the instructions about where and how to use these. Pharmacies typically print the medication instructions only on the boxes and not directly on the medication tubes.   If your medication is too expensive, please contact our office at [phone] option 4 or send [place] a message through [place].   We are unable to tell what your co-pay for medications will be in advance as this is different depending on your insurance coverage. However, we may be able to find a substitute medication at lower cost or fill out paperwork to get insurance to cover a needed medication.   If a prior authorization is required to get your medication covered by your insurance company, please allow [place] 1-2 business  days to complete this process.  Drug prices often vary depending on where the prescription is filled and some pharmacies may offer cheaper prices.  The website www.goodrx.com contains coupons for medications through different pharmacies. The prices here do not account for what the cost may be with help from insurance (it may be cheaper with your insurance), but the website  can give you the price if you did not use any insurance.  - You can print the associated coupon and take it with your prescription to the pharmacy.  - You may also stop by our office during regular business hours and pick up a GoodRx coupon card.  - If you need your prescription sent electronically to a different pharmacy, notify our office through [facility] or by phone at [phone] option 4.

## 2021-03-17 ENCOUNTER — Ambulatory Visit: Payer: 59 | Admitting: Psychiatry

## 2021-03-21 IMAGING — MG DIGITAL SCREENING BILAT W/ TOMO W/ CAD
8 series · 9 of 24 positions shown · non-contrast
Comparison: Previous exam(s).

CLINICAL DATA: Screening.

EXAM:
DIGITAL SCREENING BILATERAL MAMMOGRAM WITH TOMO AND CAD

[R CC synth-2D]
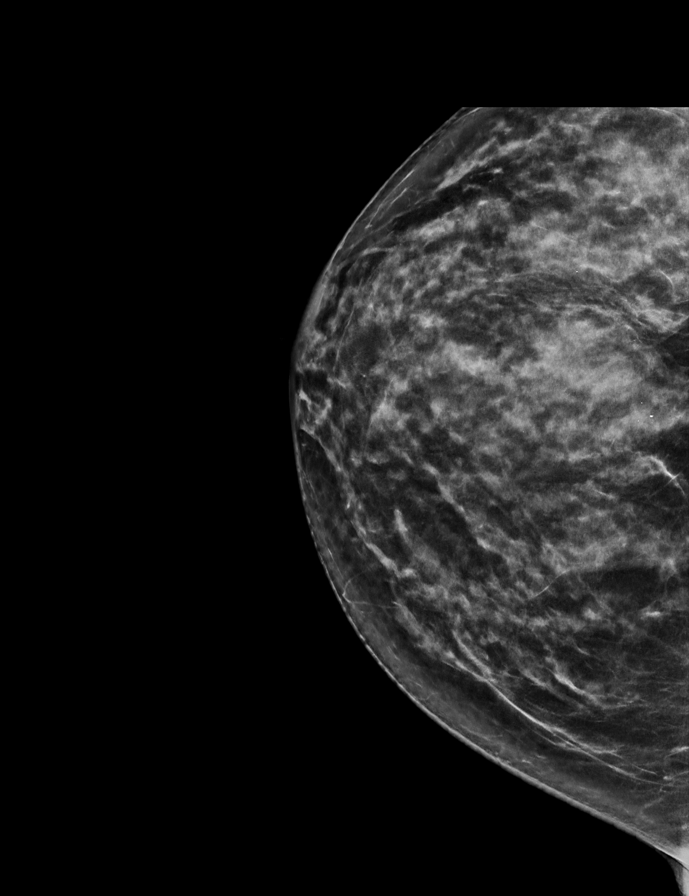

[L MLO synth-2D]
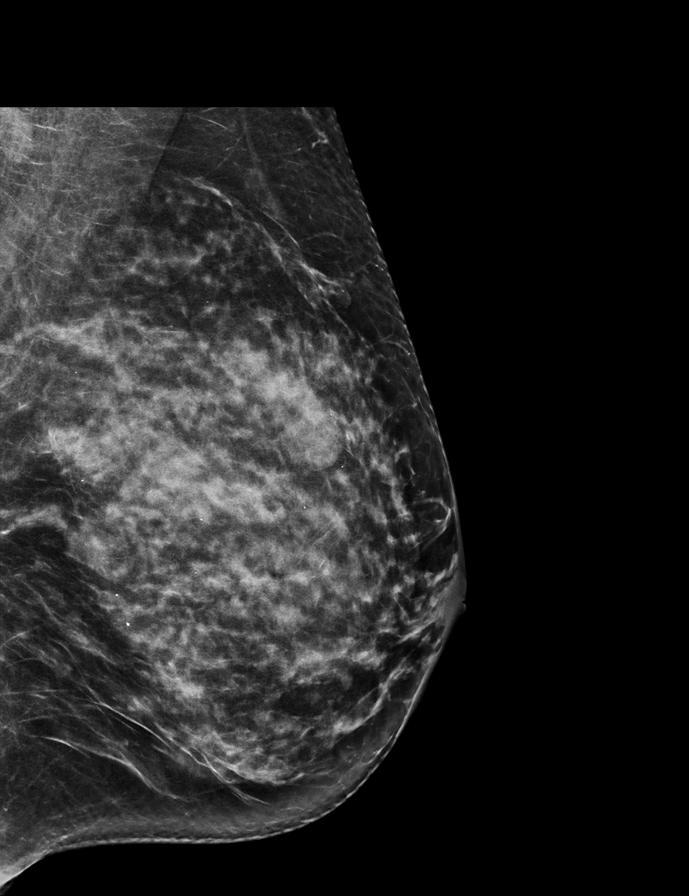

[L CC synth-2D]
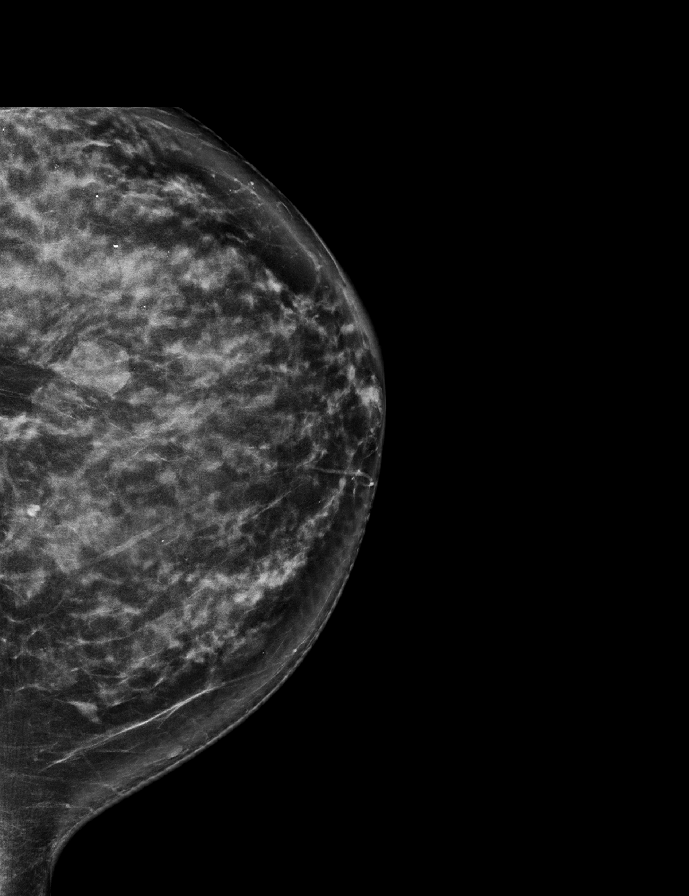

[R MLO synth-2D]
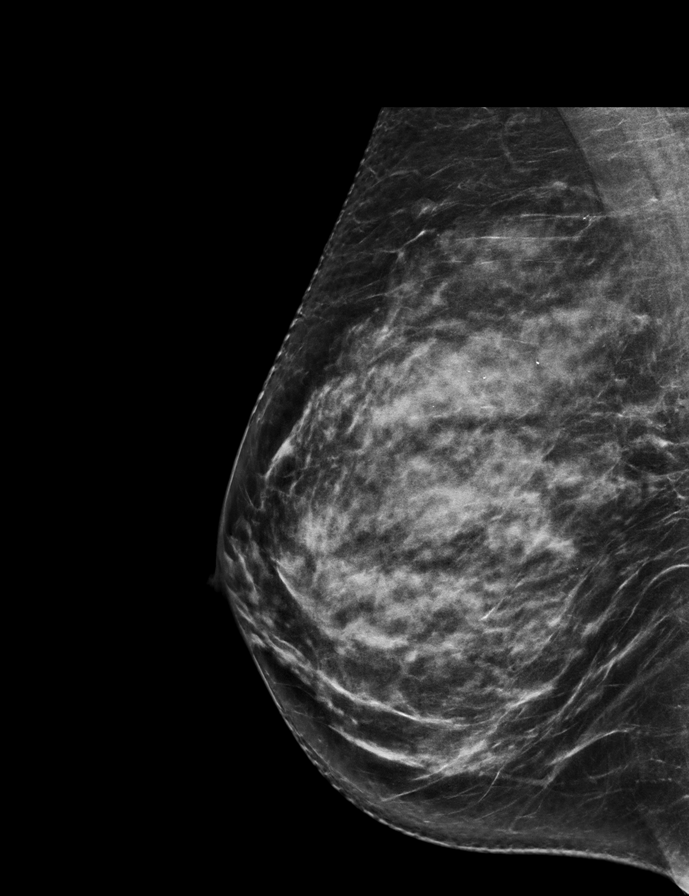

[L CC tomo · 2 of 74 frames shown]
[frame 24/74]
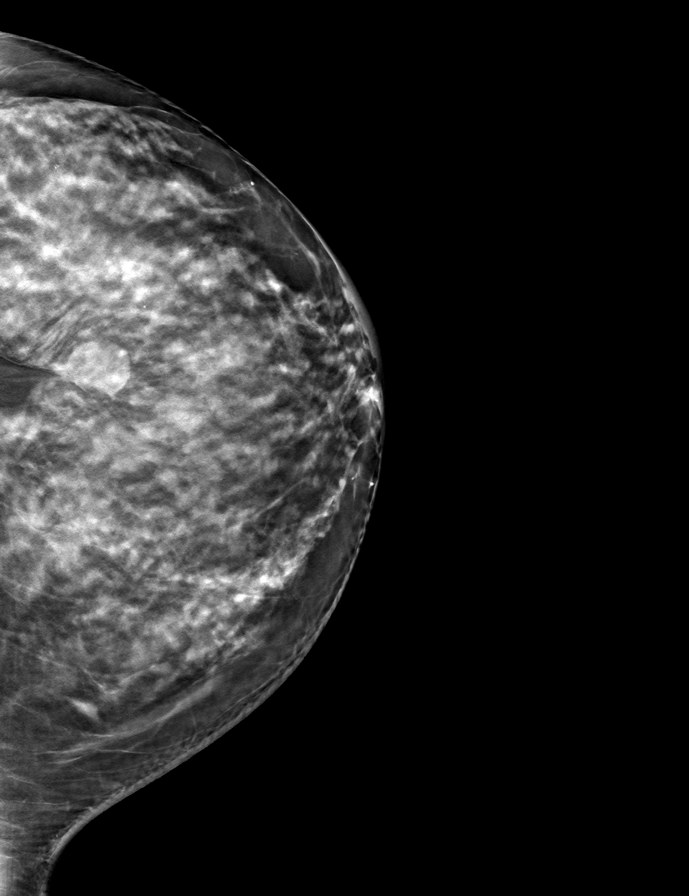
[frame 37/74]
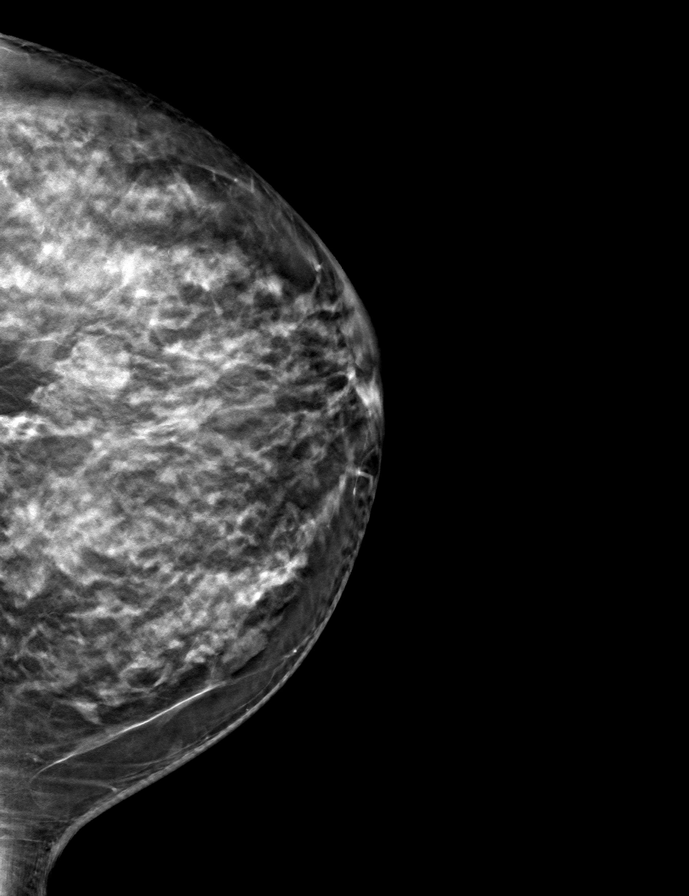

[R CC tomo · tomo slice 35/69.0]
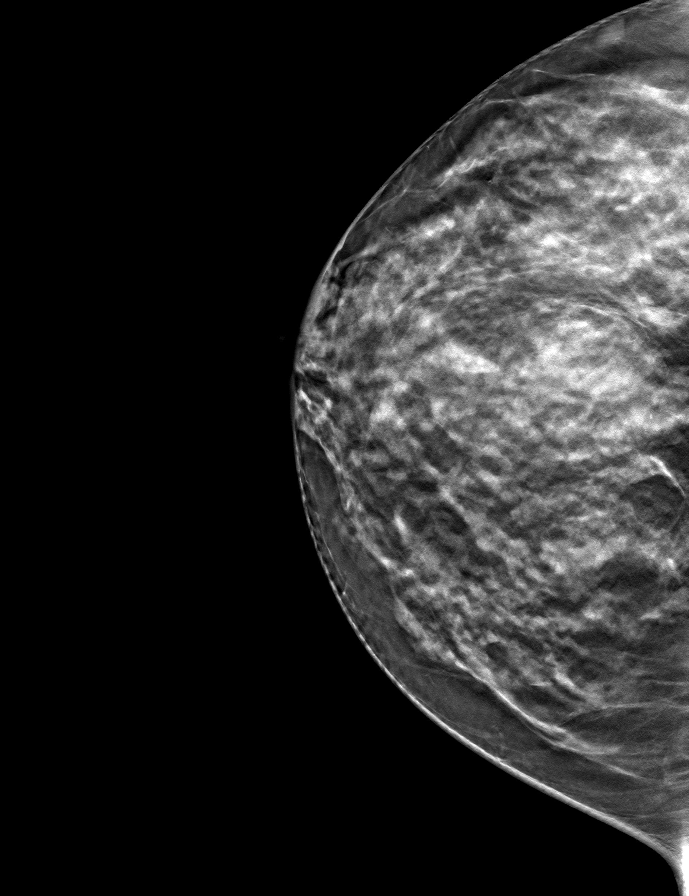

[L MLO tomo · tomo slice 35/69.0]
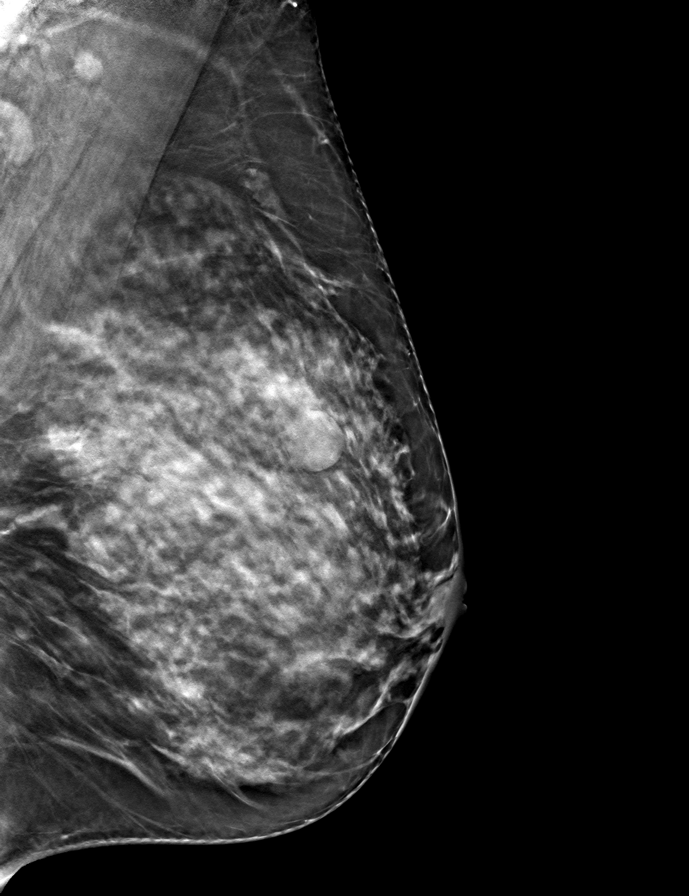

[R MLO tomo · tomo slice 37/73.0]
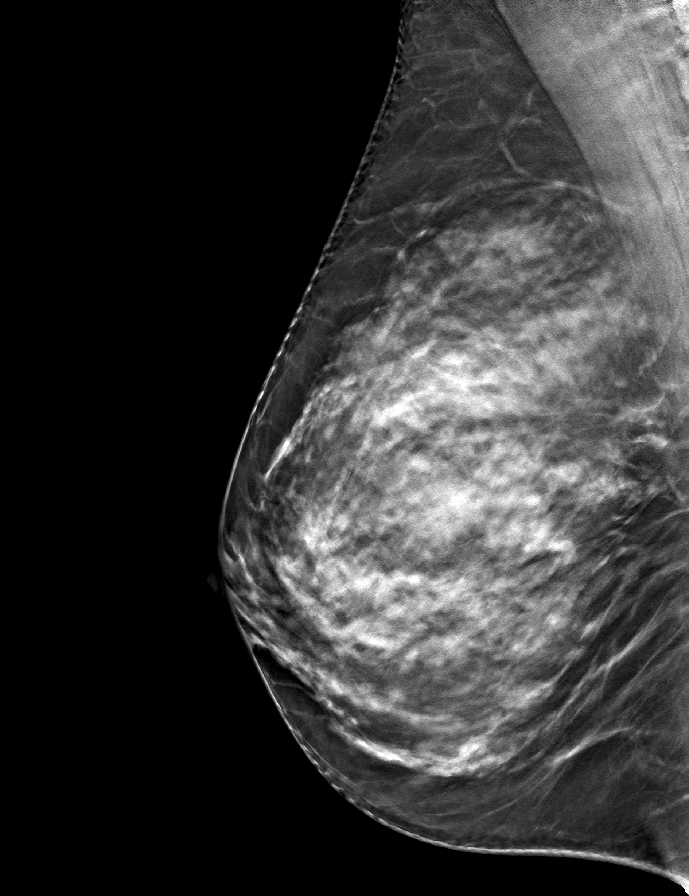

[9 of 24 positions shown; findings below may reference images not displayed]

ACR Breast Density Category c: The breast tissue is heterogeneously
dense, which may obscure small masses.
FINDINGS: There are no findings suspicious for malignancy. Images were
processed with CAD.
IMPRESSION: No mammographic evidence of malignancy. A result letter of this
screening mammogram will be mailed directly to the patient.

RECOMMENDATION:
Screening mammogram in one year. (Code:[id number])

BI-RADS CATEGORY  1: Negative.

## 2021-03-28 ENCOUNTER — Other Ambulatory Visit: Payer: Self-pay

## 2021-03-30 ENCOUNTER — Other Ambulatory Visit: Payer: Self-pay | Admitting: Pharmacist

## 2021-03-30 ENCOUNTER — Telehealth: Payer: Self-pay | Admitting: Pharmacist

## 2021-03-30 ENCOUNTER — Other Ambulatory Visit (HOSPITAL_COMMUNITY): Payer: Self-pay

## 2021-03-30 MED ORDER — COSENTYX (300 MG DOSE) 150 MG/ML ~~LOC~~ SOSY
PREFILLED_SYRINGE | SUBCUTANEOUS | 5 refills | Status: DC
  Filled 2021-03-30: qty 2, fill #0
  Filled 2021-04-11 (×2): qty 2, 28d supply, fill #0
  Filled 2021-05-03: qty 2, 28d supply, fill #1

## 2021-03-30 NOTE — Telephone Encounter (Signed)
Called patient to schedule an appointment for the [employer] [facility]. I was unable to reach the patient so I left a HIPAA-compliant message requesting that the patient return my call.   Luke Van Ausdall, PharmD, BCACP, CPP Clinical Pharmacist [facility] [phone]  

## 2021-03-31 ENCOUNTER — Other Ambulatory Visit (HOSPITAL_COMMUNITY): Payer: Self-pay

## 2021-03-31 ENCOUNTER — Other Ambulatory Visit: Payer: Self-pay

## 2021-03-31 ENCOUNTER — Ambulatory Visit: Payer: 59 | Attending: Family Medicine | Admitting: Pharmacist

## 2021-03-31 DIAGNOSIS — Z79899 Other long term (current) drug therapy: Secondary | ICD-10-CM

## 2021-03-31 NOTE — Progress Notes (Signed)
   S: Patient presents today for review of their specialty medication.   Patient is currently taking Cosentyx (secukinumab) for psoriasis. Patient is managed by Dr. Nicole Kindred for this.   Dosing: Plaque psoriasis: SubQ: 300 mg once weekly at weeks 0, 1, 2, 3, and 4 followed by 300 mg every 4 weeks. Some patients may only require 150 mg per dose.  Adherence: confirms  Efficacy: reports that it has helped with her psoriasis tremendously.   Current adverse effects: S/sx of infection: none GI upset: none Headache: none  S/sx of hypersensitivity: none  O:     Lab Results  Component Value Date   WBC 7.8 02/10/2021   HGB 12.6 02/10/2021   HCT 37.3 02/10/2021   MCV 97 02/10/2021   PLT 273 02/10/2021      Chemistry      Component Value Date/Time   NA 142 02/10/2021 1626   K 4.2 02/10/2021 1626   CL 106 02/10/2021 1626   CO2 23 02/10/2021 1626   BUN 10 02/10/2021 1626   CREATININE 0.68 02/10/2021 1626      Component Value Date/Time   CALCIUM 9.4 02/10/2021 1626   ALKPHOS 74 02/10/2021 1626   AST 17 02/10/2021 1626   ALT 11 02/10/2021 1626   BILITOT <0.2 02/10/2021 1626       A/P: 1. Medication review: Patient is currently on Cosentyx for psoriasis and is tolerating it well. Reviewed the medication with the patient, including the following: Cosentyx is a monoclonal antibody used in the treatment of ankylosing spondylitis, psoriasis, and psoriatic arthritis. The injection is subq and the medication should be allowed to reach room temp prior to injecting. Injection sites should be rotated. Possible adverse effects include headaches, GI upset, increased risk of infection and hypersensitivity reactions. No recommendations for any changes at this time.  Benard Halsted, PharmD, [place], [place] [phone]

## 2021-04-04 ENCOUNTER — Other Ambulatory Visit: Payer: Self-pay

## 2021-04-04 ENCOUNTER — Telehealth: Payer: Self-pay

## 2021-04-04 ENCOUNTER — Other Ambulatory Visit: Payer: Self-pay | Admitting: Psychiatry

## 2021-04-04 DIAGNOSIS — F411 Generalized anxiety disorder: Secondary | ICD-10-CM

## 2021-04-04 DIAGNOSIS — F410 Panic disorder [episodic paroxysmal anxiety] without agoraphobia: Secondary | ICD-10-CM

## 2021-04-04 MED ORDER — PAROXETINE HCL 20 MG PO TABS
ORAL_TABLET | ORAL | 1 refills | Status: DC
  Filled 2021-04-04: qty 45, 30d supply, fill #0

## 2021-04-04 NOTE — Telephone Encounter (Signed)
pt had to r/s her appt pt needs enough of her paxil to due untill her next appt.

## 2021-04-04 NOTE — Telephone Encounter (Signed)
Ordered

## 2021-04-07 ENCOUNTER — Other Ambulatory Visit: Payer: Self-pay

## 2021-04-07 MED FILL — Buspirone HCl Tab 10 MG: ORAL | 30 days supply | Qty: 90 | Fill #1 | Status: AC

## 2021-04-11 ENCOUNTER — Other Ambulatory Visit (HOSPITAL_COMMUNITY): Payer: Self-pay

## 2021-04-12 ENCOUNTER — Other Ambulatory Visit (HOSPITAL_COMMUNITY): Payer: Self-pay

## 2021-04-12 ENCOUNTER — Other Ambulatory Visit: Payer: Self-pay | Admitting: Family Medicine

## 2021-04-12 DIAGNOSIS — Z1231 Encounter for screening mammogram for malignant neoplasm of breast: Secondary | ICD-10-CM

## 2021-04-12 MED ORDER — MONTELUKAST SODIUM 10 MG PO TABS
ORAL_TABLET | Freq: Every day | ORAL | 3 refills | Status: DC
  Filled 2021-04-21 – 2021-05-16 (×2): qty 90, 90d supply, fill #0
  Filled 2021-09-22: qty 90, 90d supply, fill #1
  Filled 2022-02-01: qty 90, 90d supply, fill #2

## 2021-04-13 ENCOUNTER — Other Ambulatory Visit (HOSPITAL_COMMUNITY): Payer: Self-pay

## 2021-04-17 ENCOUNTER — Other Ambulatory Visit: Payer: Self-pay

## 2021-04-17 MED ORDER — OXYCODONE HCL 15 MG PO TABS: ORAL_TABLET | ORAL | 0 refills | Status: DC

## 2021-04-18 ENCOUNTER — Other Ambulatory Visit: Payer: Self-pay

## 2021-04-19 ENCOUNTER — Ambulatory Visit: Payer: Self-pay | Admitting: *Deleted

## 2021-04-19 NOTE — Telephone Encounter (Signed)
I'm not in this afternoon. If someone else is available, they can see her, otherwise I can see her tomorrow.

## 2021-04-19 NOTE — Telephone Encounter (Signed)
Routing to PCP to advise

## 2021-04-19 NOTE — Telephone Encounter (Signed)
Reason for Disposition  [1] MILD diarrhea (e.g., 1-3 or more stools than normal in past 24 hours) without known cause AND [2] present >  7 days  Answer Assessment - Initial Assessment Questions 1. DIARRHEA SEVERITY: "How bad is the diarrhea?" "How many more stools have you had in the past 24 hours than normal?"    - NO DIARRHEA (SCALE 0)   - MILD (SCALE 1-3): Few loose or mushy BMs; increase of 1-3 stools over normal daily number of stools; mild increase in ostomy output.   -  MODERATE (SCALE 4-7): Increase of 4-6 stools daily over normal; moderate increase in ostomy output. * SEVERE (SCALE 8-10; OR 'WORST POSSIBLE'): Increase of 7 or more stools daily over normal; moderate increase in ostomy output; incontinence.     1 episode today- stomach feels "grumbled" 2. ONSET: "When did the diarrhea begin?"      Started- Saturday 3. BM CONSISTENCY: "How loose or watery is the diarrhea?"      loose 4. VOMITING: "Are you also vomiting?" If Yes, ask: "How many times in the past 24 hours?"      No- no appetite 5. ABDOMINAL PAIN: "Are you having any abdominal pain?" If Yes, ask: "What does it feel like?" (e.g., crampy, dull, intermittent, constant)      Little cramping 6. ABDOMINAL PAIN SEVERITY: If present, ask: "How bad is the pain?"  (e.g., Scale 1-10; mild, moderate, or severe)   - MILD (1-3): doesn't interfere with normal activities, abdomen soft and not tender to touch    - MODERATE (4-7): interferes with normal activities or awakens from sleep, abdomen tender to touch    - SEVERE (8-10): excruciating pain, doubled over, unable to do any normal activities       mild 7. ORAL INTAKE: If vomiting, "Have you been able to drink liquids?" "How much liquids have you had in the past 24 hours?"     Yes- able to hydrate 8. HYDRATION: "Any signs of dehydration?" (e.g., dry mouth [not just dry lips], too weak to stand, dizziness, new weight loss) "When did you last urinate?"     no 9. EXPOSURE: "Have you  traveled to a foreign country recently?" "Have you been exposed to anyone with diarrhea?" "Could you have eaten any food that was spoiled?"     Exposure to father- diagnosed with C-diff 10. ANTIBIOTIC USE: "Are you taking antibiotics now or have you taken antibiotics in the past 2 months?"       Patient was treated with antibiotic- 03/15/21- Doxycycline  11. OTHER SYMPTOMS: "Do you have any other symptoms?" (e.g., fever, blood in stool)       no 12. PREGNANCY: "Is there any chance you are pregnant?" "When was your last menstrual period?"       N/a  Protocols used: [facility]

## 2021-04-19 NOTE — Telephone Encounter (Signed)
Patient is calling to report her father has been diagnosed with C-diff. Patient lives in the same home with him and was not aware until today. Patient states she has been having bouts of diarrhea since Saturday, no appetite with stomach not settled. Patient is not going to work- she does not want to expose coworkers(2 are pregnant) until she gets instructions from provider. Patient would like appointment today- she has been scheduled for tomorrow am.

## 2021-04-20 ENCOUNTER — Ambulatory Visit: Payer: 59 | Admitting: Nurse Practitioner

## 2021-04-20 ENCOUNTER — Other Ambulatory Visit: Payer: Self-pay

## 2021-04-20 ENCOUNTER — Encounter: Payer: Self-pay | Admitting: Nurse Practitioner

## 2021-04-20 VITALS — BP 116/72 | HR 77 | Temp 98.3°F | Wt 163.0 lb

## 2021-04-20 DIAGNOSIS — R197 Diarrhea, unspecified: Secondary | ICD-10-CM

## 2021-04-20 MED ORDER — METRONIDAZOLE 500 MG PO TABS: 500.0000 mg | ORAL_TABLET | Freq: Three times a day (TID) | ORAL | 0 refills | Status: DC

## 2021-04-20 NOTE — Patient Instructions (Signed)
Start antibiotic after getting stool sample Bring back stool sample Monday-Friday 8am-5pm (not 12pm-1pm)

## 2021-04-20 NOTE — Progress Notes (Signed)
Acute Office Visit  Subjective:    Patient ID: Jamie [name], female    DOB: 1971/04/07, 50 y.o.   MRN: [id number]  Chief Complaint  Patient presents with   Diarrhea    Pt states she has been having diarrhea for the past few days. States her dad is in the home and tested positive for C-diff    HPI Patient is in today for diarrhea for 6 days. Her dad lives with her and he was recently diagnosed with c-diff. Stool is loose and watery. Endorses some stomach gurgling. Denies pain, fever, blood in stool.    Past Medical History:  Diagnosis Date   Anxiety    takes Ativan daily.  Panic Attack   Arthritis    Chronic back pain    herniated disc/stenosis/scoliosis   Depression    takes Effexor daily   Eczema    uses a cream daily as needed   Family history of adverse reaction to anesthesia    pta dad is very hard to wake up;excessive nausea   History of bronchitis    > 24yrago   Hx of dysplastic nevus 05/10/2020   neck - posterior, Severe Atypia. Shave removal 07/18/2020, margins free   Hypoglycemia    Insomnia    takes Lorazepam nightly   Joint pain    PONV (postoperative nausea and vomiting)    Seasonal allergies    uses Flonase daily   Vitamin D deficiency    takes Vit D daily    Past Surgical History:  Procedure Laterality Date   BACK SURGERY  2008/2012   laminectomy 1st time and 2nd time laminectomy and fusion   COLONOSCOPY WITH PROPOFOL N/A 05/27/2020   Procedure: COLONOSCOPY WITH PROPOFOL;  Surgeon: TVirgel Manifold MD;  Location: [facility];  Service: Gastroenterology;  Laterality: N/A;  COVID POSITIVE AUGUST 16   MAXIMUM ACCESS (MAS)POSTERIOR LUMBAR INTERBODY FUSION (PLIF) 2 LEVEL N/A 09/01/2014   Procedure: Lumbar four-five, Lumbar five-Sacral one Maximum Access Surgery posterior lumbar interbody fusion with interbody prosthesis posterior lateral arthrodesis posterior segmental instrumentation;  Surgeon: GElaina Hoops MD;  [facility];  Service:  Neurosurgery;  Laterality: N/A;  Lumbar four-five, Lumbar five-Sacral one Maximum Access Surgery posterior lumbar interbody fusion with i    Family History  Problem Relation Age of Onset   Arthritis Mother    Hyperlipidemia Mother    Migraines Mother    Arthritis Father    Diabetes Father    Heart disease Father    Atrial fibrillation Father    Diabetes Brother    Breast cancer Maternal Grandmother    Mental illness Neg Hx     Social History   Socioeconomic History   Marital status: Divorced    Spouse name: Not on file   Number of children: Not on file   Years of education: Not on file   Highest education level: Bachelor's degree (e.g., BA, AB, BS)  Occupational History   Not on file  Tobacco Use   Smoking status: Former    Packs/day: 0.50    Years: 20.00    Pack years: 10.00    Types: Cigarettes    Quit date: 10/18/2016    Years since quitting: 4.5   Smokeless tobacco: Never  Vaping Use   Vaping Use: Never used  Substance and Sexual Activity   Alcohol use: No   Drug use: No   Sexual activity: Not Currently  Other Topics Concern   Not on file  Social  History Narrative   Not on file   Social Determinants of Health   Financial Resource Strain: Not on file  Food Insecurity: Not on file  Transportation Needs: Not on file  Physical Activity: Not on file  Stress: Not on file  Social Connections: Not on file  Intimate Partner Violence: Not on file    Outpatient Medications Prior to Visit  Medication Sig Dispense Refill   Ascorbic Acid (VITAMIN C PO) Take by mouth.     busPIRone (BUSPAR) 10 MG tablet TAKE 1 TABLET (10 MG TOTAL) BY MOUTH 3 (THREE) TIMES DAILY. 90 tablet 1   cholecalciferol (VITAMIN D) 1000 UNITS tablet Take 1,000 Units by mouth daily.     clindamycin (CLEOCIN T) 1 % lotion Apply to affected areas 1-2 times a day until rash improved. 60 mL 1   clobetasol cream (TEMOVATE) 0.05 % Apply to affected areas 1-2 times a day until rash improved. Avoid  face, groin, axilla. 60 g 1   COVID-19 mRNA vaccine, Pfizer, 30 MCG/0.3ML injection USE AS DIRECTED .3 mL 0   fluticasone (FLONASE) 50 MCG/ACT nasal spray Place 1 spray into both nostrils daily as needed for allergies.      gabapentin (NEURONTIN) 600 MG tablet TAKE 1 TABLET BY MOUTH THREE TIMES DAILY 90 tablet 1   halobetasol (ULTRAVATE) 0.05 % cream Apply topically 2 (two) times daily as needed. Up to 2 weeks. Avoid face, groin, underarms 50 g 2   Halobetasol Prop-Tazarotene (DUOBRII) 0.01-0.045 % LOTN Apply 1 application topically at bedtime. qhs to aa rash on hands and feet until clear, then prn flares, avoid face, groin, axilla 100 g 1   hydrOXYzine (ATARAX/VISTARIL) 10 MG tablet Take 1-3 tablets by mouth every night as needed for itch. 90 tablet 1   meloxicam (MOBIC) 7.5 MG tablet TAKE 1 TABLET BY MOUTH TWICE DAILY 60 tablet 2   metaxalone (SKELAXIN) 800 MG tablet TAKE 1 TABLET BY MOUTH 4 TIMES DAILY AS NEEDED 120 tablet 2   montelukast (SINGULAIR) 10 MG tablet TAKE 1 TABLET BY MOUTH AT BEDTIME 90 tablet 3   morphine (KADIAN) 30 MG 24 hr capsule Take 30 mg by mouth daily.      Multiple Vitamin (MULTIVITAMIN ADULT PO) Take by mouth daily.     Multiple Vitamins-Minerals (ZINC PO) Take by mouth.     oxyCODONE (ROXICODONE) 15 MG immediate release tablet Take 15 mg by mouth every 6 (six) hours as needed.   0   PARoxetine (PAXIL) 20 MG tablet TAKE 1 & 1/2 TABLETS (30 MG) BY MOUTH DAILY. 45 tablet 1   propranolol (INDERAL) 20 MG tablet TAKE 1 TABLET (20 MG TOTAL) BY MOUTH 2 (TWO) TIMES DAILY AS NEEDED FOR SEVERE PANIC ATTACKS 60 tablet 1   QUEtiapine (SEROQUEL) 25 MG tablet Take 1 tablet (25 mg total) by mouth at bedtime. 30 tablet 1   Secukinumab, 300 MG Dose, (COSENTYX, 300 MG DOSE,) 150 MG/ML SOSY Inject 300 mg into the skin every 28 days for maintenance 2 mL 5   triamcinolone cream (KENALOG) 0.1 % Apply 1 application topically 2 (two) times daily. 60 g 6   BOOSTRIX 5-2.5-18.5 LF-MCG/0.5  injection      gabapentin (NEURONTIN) 600 MG tablet Take 600 mg by mouth 3 (three) times daily.      gabapentin (NEURONTIN) 600 MG tablet TAKE 1 TABLET BY MOUTH 3 TIMES DAILY 90 tablet 1   gabapentin (NEURONTIN) 600 MG tablet TAKE 1 TABLET BY MOUTH 3 TIMES DAILY 90 tablet  2   meloxicam (MOBIC) 7.5 MG tablet Take 7.5 mg by mouth 2 (two) times daily.     meloxicam (MOBIC) 7.5 MG tablet TAKE 1 TABLET BY MOUTH 2 TIMES DAILY 60 tablet 2   meloxicam (MOBIC) 7.5 MG tablet TAKE 1 TABLET BY MOUTH TWO TIMES DAILY 60 tablet 2   metaxalone (SKELAXIN) 800 MG tablet Take 800 mg by mouth 3 (three) times daily.     metaxalone (SKELAXIN) 800 MG tablet TAKE 1 TABLET BY MOUTH 4 TIMES DAILY AS NEEDED 120 tablet 1   metaxalone (SKELAXIN) 800 MG tablet TAKE 1 TABLET BY MOUTH FOUR TIMES DAILY AS NEEDED 120 tablet 2   ondansetron (ZOFRAN ODT) 4 MG disintegrating tablet Take 1 tablet (4 mg total) by mouth every 8 (eight) hours as needed for nausea or vomiting. 20 tablet 0   oxyCODONE (ROXICODONE) 15 MG immediate release tablet TAKE 1 TABLET BY MOUTH EVERY 6 HOURS AS NEEDED FOR BREAKTHROUGH PAIN (DNF 10/28/20) 120 tablet 0   PFIZER-BIONTECH COVID-19 VACC 30 MCG/0.3ML injection      Secukinumab, 300 MG Dose, (COSENTYX SENSOREADY, 300 MG,) 150 MG/ML SOAJ Inject 300 mg into the skin every 28 (twenty-eight) days. For maintenance. 2 mL 5   No facility-administered medications prior to visit.    Allergies  Allergen Reactions   Shellfish Allergy Hives   Flexeril [Cyclobenzaprine] Hives    RAPID HEARTBEAT    Review of Systems  Constitutional: Negative.   Respiratory: Negative.    Gastrointestinal:  Positive for diarrhea. Negative for abdominal pain and blood in stool.  Genitourinary: Negative.   Skin: Negative.   Neurological: Negative.       Objective:    Physical Exam Vitals and nursing note reviewed.  Constitutional:      General: She is not in acute distress.    Appearance: Normal appearance.  HENT:      Head: Normocephalic.  Eyes:     Conjunctiva/sclera: Conjunctivae normal.  Cardiovascular:     Rate and Rhythm: Normal rate.     Pulses: Normal pulses.  Pulmonary:     Effort: Pulmonary effort is normal.  Abdominal:     Palpations: Abdomen is soft.     Tenderness: There is no abdominal tenderness.  Musculoskeletal:     Cervical back: Normal range of motion.  Skin:    General: Skin is warm.  Neurological:     General: No focal deficit present.     Mental Status: She is alert and oriented to person, place, and time.  Psychiatric:        Mood and Affect: Mood normal.        Behavior: Behavior normal.        Thought Content: Thought content normal.        Judgment: Judgment normal.    BP 116/72   Pulse 77   Temp 98.3 F (36.8 C) (Oral)   Wt 163 lb (73.9 kg)   LMP 06/20/2016 (Approximate)   SpO2 98%   BMI 26.31 kg/m  Wt Readings from Last 3 Encounters:  04/20/21 163 lb (73.9 kg)  02/10/21 162 lb (73.5 kg)  05/27/20 150 lb (68 kg)    Health Maintenance Due  Topic Date Due   Pneumococcal Vaccine 34-4 Years old (1 - PCV) Never done   Zoster Vaccines- Shingrix (1 of 2) Never done   COVID-19 Vaccine (4 - Booster for Pfizer series) 10/27/2020   INFLUENZA VACCINE  03/20/2021    There are no preventive care reminders to display  for this patient.   Lab Results  Component Value Date   TSH 0.616 02/10/2021   Lab Results  Component Value Date   WBC 7.8 02/10/2021   HGB 12.6 02/10/2021   HCT 37.3 02/10/2021   MCV 97 02/10/2021   PLT 273 02/10/2021   Lab Results  Component Value Date   NA 142 02/10/2021   K 4.2 02/10/2021   CO2 23 02/10/2021   GLUCOSE 102 (H) 02/10/2021   BUN 10 02/10/2021   CREATININE 0.68 02/10/2021   BILITOT <0.2 02/10/2021   ALKPHOS 74 02/10/2021   AST 17 02/10/2021   ALT 11 02/10/2021   PROT 6.6 02/10/2021   ALBUMIN 4.5 02/10/2021   CALCIUM 9.4 02/10/2021   ANIONGAP 8 08/08/2015   EGFR 105 02/10/2021   Lab Results  Component Value  Date   CHOL 197 02/10/2021   Lab Results  Component Value Date   HDL 46 02/10/2021   Lab Results  Component Value Date   LDLCALC 120 (H) 02/10/2021   Lab Results  Component Value Date   TRIG 173 (H) 02/10/2021   Lab Results  Component Value Date   CHOLHDL 3.6 10/16/2016   No results found for: HGBA1C     Assessment & Plan:   Problem List Items Addressed This Visit   None Visit Diagnoses     Diarrhea of presumed infectious origin    -  Primary   Will check stool for c-diff, O&P and culture. Will presumtively treat for c-diff with close contact with flagyl after bringing stool sample. Encourage fluids.   Relevant Orders   Cdiff NAA+O+P+Stool Culture        Meds ordered this encounter  Medications   metroNIDAZOLE (FLAGYL) 500 MG tablet    Sig: Take 1 tablet (500 mg total) by mouth 3 (three) times daily.    Dispense:  30 tablet    Refill:  0     Charyl Dancer, NP

## 2021-04-21 ENCOUNTER — Other Ambulatory Visit: Payer: Self-pay

## 2021-04-21 ENCOUNTER — Other Ambulatory Visit: Payer: 59

## 2021-04-21 DIAGNOSIS — R197 Diarrhea, unspecified: Secondary | ICD-10-CM | POA: Diagnosis not present

## 2021-04-21 NOTE — Addendum Note (Signed)
Addended by: Rudie Meyer on: 04/21/2021 08:56 AM   Modules accepted: Orders

## 2021-04-27 DIAGNOSIS — I10 Essential (primary) hypertension: Secondary | ICD-10-CM | POA: Diagnosis not present

## 2021-04-27 DIAGNOSIS — M5416 Radiculopathy, lumbar region: Secondary | ICD-10-CM | POA: Diagnosis not present

## 2021-04-27 DIAGNOSIS — Z6825 Body mass index (BMI) 25.0-25.9, adult: Secondary | ICD-10-CM | POA: Diagnosis not present

## 2021-04-27 DIAGNOSIS — M961 Postlaminectomy syndrome, not elsewhere classified: Secondary | ICD-10-CM | POA: Diagnosis not present

## 2021-05-03 ENCOUNTER — Other Ambulatory Visit (HOSPITAL_COMMUNITY): Payer: Self-pay

## 2021-05-05 ENCOUNTER — Other Ambulatory Visit: Payer: Self-pay

## 2021-05-08 ENCOUNTER — Other Ambulatory Visit (HOSPITAL_COMMUNITY): Payer: Self-pay

## 2021-05-08 LAB — CDIFF NAA+O+P+STOOL CULTURE
E coli, Shiga toxin Assay: NEGATIVE
Toxigenic C. Difficile by PCR: NEGATIVE

## 2021-05-16 ENCOUNTER — Ambulatory Visit
Admission: RE | Admit: 2021-05-16 | Discharge: 2021-05-16 | Disposition: A | Discharge status: Home / Self Care | Payer: 59 | Source: Ambulatory Visit | Attending: Family Medicine | Admitting: Family Medicine

## 2021-05-16 ENCOUNTER — Ambulatory Visit (INDEPENDENT_AMBULATORY_CARE_PROVIDER_SITE_OTHER): Payer: 59 | Admitting: Dermatology

## 2021-05-16 ENCOUNTER — Other Ambulatory Visit: Payer: Self-pay

## 2021-05-16 ENCOUNTER — Ambulatory Visit (INDEPENDENT_AMBULATORY_CARE_PROVIDER_SITE_OTHER): Payer: 59 | Admitting: Psychiatry

## 2021-05-16 ENCOUNTER — Encounter: Payer: Self-pay | Admitting: Psychiatry

## 2021-05-16 VITALS — BP 112/70 | HR 78 | Temp 98.3°F | Wt 163.8 lb

## 2021-05-16 DIAGNOSIS — L739 Follicular disorder, unspecified: Secondary | ICD-10-CM

## 2021-05-16 DIAGNOSIS — Z1231 Encounter for screening mammogram for malignant neoplasm of breast: Secondary | ICD-10-CM | POA: Diagnosis not present

## 2021-05-16 DIAGNOSIS — L309 Dermatitis, unspecified: Secondary | ICD-10-CM

## 2021-05-16 DIAGNOSIS — F411 Generalized anxiety disorder: Secondary | ICD-10-CM | POA: Diagnosis not present

## 2021-05-16 DIAGNOSIS — F331 Major depressive disorder, recurrent, moderate: Secondary | ICD-10-CM | POA: Diagnosis not present

## 2021-05-16 DIAGNOSIS — F410 Panic disorder [episodic paroxysmal anxiety] without agoraphobia: Secondary | ICD-10-CM

## 2021-05-16 DIAGNOSIS — H5213 Myopia, bilateral: Secondary | ICD-10-CM | POA: Diagnosis not present

## 2021-05-16 MED ORDER — DUPIXENT 300 MG/2ML ~~LOC~~ SOAJ: 300.0000 mg | SUBCUTANEOUS | 5 refills | Status: DC

## 2021-05-16 MED ORDER — DOXYCYCLINE MONOHYDRATE 100 MG PO TABS
100.0000 mg | ORAL_TABLET | Freq: Two times a day (BID) | ORAL | 0 refills | Status: AC
Start: 2021-05-16 — End: 2021-05-30
  Filled 2021-05-16: qty 28, 14d supply, fill #0

## 2021-05-16 MED ORDER — DUPIXENT 300 MG/2ML ~~LOC~~ SOPN
600.0000 mg | PEN_INJECTOR | Freq: Once | SUBCUTANEOUS | 0 refills | Status: AC
Start: 2021-05-16 — End: 2021-05-16

## 2021-05-16 MED ORDER — PAROXETINE HCL 20 MG PO TABS
ORAL_TABLET | ORAL | 1 refills | Status: DC
  Filled 2021-05-16: qty 45, 30d supply, fill #0
  Filled 2021-07-04: qty 45, 30d supply, fill #1

## 2021-05-16 MED ORDER — MIRTAZAPINE 7.5 MG PO TABS
7.5000 mg | ORAL_TABLET | Freq: Every day | ORAL | 1 refills | Status: DC
  Filled 2021-05-16: qty 30, 30d supply, fill #0

## 2021-05-16 MED ORDER — TACROLIMUS 0.1 % EX OINT
TOPICAL_OINTMENT | Freq: Two times a day (BID) | CUTANEOUS | 1 refills | Status: DC
  Filled 2021-05-16: qty 100, 30d supply, fill #0

## 2021-05-16 MED ORDER — BUSPIRONE HCL 10 MG PO TABS
10.0000 mg | ORAL_TABLET | Freq: Every day | ORAL | 1 refills | Status: DC
  Filled 2021-05-16: qty 30, 30d supply, fill #0
  Filled 2021-06-13: qty 30, 30d supply, fill #1

## 2021-05-16 NOTE — Patient Instructions (Signed)
Mirtazapine Tablets What is this medication? MIRTAZAPINE (mir TAZ a peen) treats depression. It increases the amount of serotonin and norepinephrine in the brain, hormones that help regulate mood. This medicine may be used for other purposes; ask your health care provider or pharmacist if you have questions. COMMON BRAND NAME(S): Remeron What should I tell my care team before I take this medication? They need to know if you have any of these conditions: Bipolar disorder Glaucoma Kidney disease Liver disease Suicidal thoughts An unusual or allergic reaction to mirtazapine, other medications, foods, dyes, or preservatives Pregnant or trying to get pregnant Breast-feeding How should I use this medication? Take this medication by mouth with a glass of water. Follow the directions on the prescription label. Take your medication at regular intervals. Do not take your medication more often than directed. Do not stop taking this medication suddenly except upon the advice of your care team. Stopping this medication too quickly may cause serious side effects or your condition may worsen. A special MedGuide will be given to you by the pharmacist with each prescription and refill. Be sure to read this information carefully each time. Talk to your care team about the use of this medication in children. Special care may be needed. Overdosage: If you think you have taken too much of this medicine contact a poison control center or emergency room at once. NOTE: This medicine is only for you. Do not share this medicine with others. What if I miss a dose? If you miss a dose, take it as soon as you can. If it is almost time for your next dose, take only that dose. Do not take double or extra doses. What may interact with this medication? Do not take this medication with any of the following: Linezolid MAOIs like Carbex, Eldepryl, Marplan, Nardil, and Parnate Methylene blue (injected into a vein) This  medication may also interact with the following: Alcohol Antiviral medications for HIV or AIDS Certain medications that treat or prevent blood clots like warfarin Certain medications for depression, anxiety, or psychotic disturbances Certain medications for fungal infections like ketoconazole and itraconazole Certain medications for migraine headache like almotriptan, eletriptan, frovatriptan, naratriptan, rizatriptan, sumatriptan, zolmitriptan Certain medications for seizures like carbamazepine or phenytoin Certain medications for sleep Cimetidine Erythromycin Fentanyl Lithium Medications for blood pressure Nefazodone Rasagiline Rifampin Supplements like St. John's wort, kava kava, valerian Tramadol Tryptophan This list may not describe all possible interactions. Give your health care provider a list of all the medicines, herbs, non-prescription drugs, or dietary supplements you use. Also tell them if you smoke, drink alcohol, or use illegal drugs. Some items may interact with your medicine. What should I watch for while using this medication? Tell your care team if your symptoms do not get better or if they get worse. Visit your care team for regular checks on your progress. Because it may take several weeks to see the full effects of this medication, it is important to continue your treatment as prescribed by your doctor. Patients and their families should watch out for new or worsening thoughts of suicide or depression. Also watch out for sudden changes in feelings such as feeling anxious, agitated, panicky, irritable, hostile, aggressive, impulsive, severely restless, overly excited and hyperactive, or not being able to sleep. If this happens, especially at the beginning of treatment or after a change in dose, call your care team. You may get drowsy or dizzy. Do not drive, use machinery, or do anything that needs mental alertness until   you know how this medication affects you. Do not  stand or sit up quickly, especially if you are an older patient. This reduces the risk of dizzy or fainting spells. Alcohol may interfere with the effect of this medication. Avoid alcoholic drinks. This medication may cause dry eyes and blurred vision. If you wear contact lenses you may feel some discomfort. Lubricating drops may help. See your eye care team if the problem does not go away or is severe. Your mouth may get dry. Chewing sugarless gum or sucking hard candy, and drinking plenty of water may help. Contact your care team if the problem does not go away or is severe. What side effects may I notice from receiving this medication? Side effects that you should report to your care team as soon as possible: Allergic reactions-skin rash, itching, hives, swelling of the face, lips, tongue, or throat Heart rhythm changes-fast or irregular heartbeat, dizziness, feeling faint or lightheaded, chest pain, trouble breathing Infection-fever, chills, cough, or sore throat Irritability, confusion, fast or irregular heartbeat, muscle stiffness, twitching muscles, sweating, high fever, seizure, chills, vomiting, diarrhea, which may be signs of serotonin syndrome Low sodium level-muscle weakness, fatigue, dizziness, headache, confusion Rash, fever, and swollen lymph nodes Redness, blistering, peeling or loosening of the skin, including inside the mouth Seizures Sudden eye pain or change in vision such as blurry vision, seeing halos around lights, vision loss Thoughts of suicide or self-harm, worsening mood, feelings of depression Side effects that usually do not require medical attention (report to your care team if they continue or are bothersome): Constipation Dizziness Drowsiness Dry mouth Increase in appetite Weight gain This list may not describe all possible side effects. Call your doctor for medical advice about side effects. You may report side effects to FDA at 1-800-FDA-1088. Where should I  keep my medication? Keep out of the reach of children. Store at room temperature between 15 and 30 degrees C (59 and 86 degrees F) Protect from light and moisture. Throw away any unused medication after the expiration date. NOTE: This sheet is a summary. It may not cover all possible information. If you have questions about this medicine, talk to your doctor, pharmacist, or health care provider.  2022 Elsevier/Gold Standard (2020-09-27 12:55:45)

## 2021-05-16 NOTE — Progress Notes (Signed)
Follow-Up Visit   Subjective  Jamie [name] is a 50 y.o. female who presents for the following: Follow-up (Patient here today for 3 month follow up on psoriasis. She is currently taking Cocentyx , clobetasol, clindamycin lotion and hydroxyzine.  Patient states she feels like areas are not getting better and has some new areas on hands , face, and legs. She reports hands and feet area painful. ).  Spots on body come up as pus bumps and are very itchy.  No side effects from Cosentyx. She has long h/o joint pain.   Med not helping skin or joints.   Patient reports a similar flare at age 42 at just hands after mother had passed away. Pt has h/o seasonal allergies.  She was treated for dyshidrotic eczema in 2016 with topical temovate, TMC, and Eucrisa.  The following portions of the chart were reviewed this encounter and updated as appropriate:      Review of Systems: No other skin or systemic complaints except as noted in HPI or Assessment and Plan.   Objective  Well appearing patient in no apparent distress; mood and affect are within normal limits.  A focused examination was performed including face, scalp, hands, wrist, palms of hands, feet, legs. Relevant physical exam findings are noted in the Assessment and Plan.  bilateral legs and arms  pink excoriated papules on post thighs,anterior thighs, lower legs and post upper arms. All secondary lesions  bilateral hands Erythema and hyperkeratosis with microvesicles and crusted excoriations at entire palms of BL hands and BL plantar feet at instep. BSA 4%    Assessment & Plan  Folliculitis bilateral legs and arms  Start Doxycycline 100 by bid for 2 weeks with food  Start clindamycin  lotion to aas twice daily as needed   May use clobetasol cream spot treat bid prn itch  Doxycycline should be taken with food to prevent nausea. Do not lay down for 30 minutes after taking. Be cautious with sun exposure and use good sun protection  while on this medication. Pregnant women should not take this medication.     doxycycline (ADOXA) 100 MG tablet - bilateral legs and arms Take 1 tablet (100 mg total) by mouth 2 (two) times daily for 14 days. Take with food  Hand dermatitis bilateral hands  Severe chronic condition Probable atopic dermatitis r/o contact dermatitis.  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  Will d/c and switch to Dupixent injections.  Patch test placed today on back - true test 36 Discussed that skin biopsy does not differentiate between dyshidrotic dermatitis and pustular psoriasis on the palms/soles and that they also appear very similar clinically  D/C Cosentyx 300 mg SQ  Start Tacrolimus 0.1 % ointment - apply topically to hands and feet twice daily.  Continue clobetasol cream Apply to AA qd/bid AA until improved dsp 60g 1Rf. Avoid face, groin, axilla. Continue hydroxyzine 10mg  take 1-3 po qhs dsp #90 1Rf.  Will start Dupixent samples after patch test results- Enrollment Forms filled out and medication sent to [facility] is a treatment given by injection for adults and children with moderate-to-severe atopic dermatitis. Goal is control of skin condition, not cure. It is given as 2 injections at the first dose followed by 1 injection ever 2 weeks thereafter.  Young children are dosed monthly.  Potential side effects include allergic reaction, herpes infections, injection site reactions and conjunctivitis (inflammation of the eyes).  The  use of Dupixent requires long term medication management, including periodic office visits.       tacrolimus (PROTOPIC) 0.1 % ointment - bilateral hands Apply topically 2 (two) times daily. Apply to feet and hands  Dupilumab (DUPIXENT) 300 MG/2ML SOPN - bilateral hands Inject 600 mg into the skin once for 1 dose. On day 1.  Dupilumab (DUPIXENT) 300 MG/2ML SOPN - bilateral  hands Inject 300 mg into the skin every 14 (fourteen) days. Starting at day 15 for maintenance.  Return in 1 week (on 05/23/2021), or with Dr. Chauncey Cruel, for patch test reading on thursday with Jamie [name], 1 week follow up Dr. Nicole Kindred . I, Ruthell Rummage, CMA, am acting as scribe for [name], MD.  Documentation: I have reviewed the above documentation for accuracy and completeness, and I agree with the above.  [name] MD

## 2021-05-16 NOTE — Progress Notes (Signed)
[facility] OP Progress Note  05/16/2021 5:22 PM Jamie [name]  MRN:  [id number]  Chief Complaint:  Chief Complaint   Follow-up    HPI: Jamie [name] is a 50 year old Caucasian female, employed, divorced, lives in [place], has a history of MDD, panic attacks, GAD, chronic pain was evaluated in office today.  Patient today reports she is currently struggling a lot at work due to workload.  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  She is currently also working on weekends to cover her workload.  That does have an impact on her anxiety.  She reports she is also worried a lot about her mother's health.  Her mother was due to get hip surgery and also was recently treated for lung cancer.  She did not need chemoradiation since it was a small lesion.  Patient however reports her mother is depressed and that makes her worried.  Patient has racing thoughts at night and this does have an impact on her sleep.  She stopped the Seroquel since she felt it made her groggy the next day.  Patient denies any suicidality, homicidality or perceptual disturbances.  She continues to struggle with physical problems especially pain.  Patient struggles with back pain and is currently trying to get treatment for the same.  Patient walks with a limp after sitting for a long time due to her back.  Patient has not been able to establish care with a therapist yet however agrees to do so soon.  She denies any side effects to her medications like Paxil or BuSpar.  Patient denies any other concerns today.  Visit Diagnosis:    ICD-10-CM   1. MDD (major depressive disorder), recurrent episode, moderate (HCC)  F33.1     2. GAD (generalized anxiety disorder)  F41.1 busPIRone (BUSPAR) 10 MG tablet    mirtazapine (REMERON) 7.5 MG tablet    PARoxetine (PAXIL) 20 MG tablet    3. Panic attack  F41.0 PARoxetine (PAXIL) 20 MG tablet      Past Psychiatric History: Reviewed past psychiatric  history from progress note on 04/26/2020.  Past trials of medications-Trintellix-expensive, Seroquel, Abilify, Effexor, sertraline, Cymbalta-bruxism, Belsomra, trazodone, Ambien, Klonopin  Past Medical History:  Past Medical History:  Diagnosis Date   Anxiety    takes Ativan daily.  Panic Attack   Arthritis    Chronic back pain    herniated disc/stenosis/scoliosis   Depression    takes Effexor daily   Eczema    uses a cream daily as needed   Family history of adverse reaction to anesthesia    pta dad is very hard to wake up;excessive nausea   History of bronchitis    > 44yr ago   Hx of dysplastic nevus 05/10/2020   neck - posterior, Severe Atypia. Shave removal 07/18/2020, margins free   Hypoglycemia    Insomnia    takes Lorazepam nightly   Joint pain    PONV (postoperative nausea and vomiting)    Seasonal allergies    uses Flonase daily   Vitamin D deficiency    takes Vit D daily    Past Surgical History:  Procedure Laterality Date   BACK SURGERY  2008/2012   laminectomy 1st time and 2nd time laminectomy and fusion   COLONOSCOPY WITH PROPOFOL N/A 05/27/2020   Procedure: COLONOSCOPY WITH PROPOFOL;  Surgeon: Virgel Manifold, MD;  Location: [facility];  Service: Gastroenterology;  Laterality: N/A;  COVID POSITIVE AUGUST 16   MAXIMUM  ACCESS (MAS)POSTERIOR LUMBAR INTERBODY FUSION (PLIF) 2 LEVEL N/A 09/01/2014   Procedure: Lumbar four-five, Lumbar five-Sacral one Maximum Access Surgery posterior lumbar interbody fusion with interbody prosthesis posterior lateral arthrodesis posterior segmental instrumentation;  Surgeon: Elaina Hoops, MD;  Location: [facility];  Service: Neurosurgery;  Laterality: N/A;  Lumbar four-five, Lumbar five-Sacral one Maximum Access Surgery posterior lumbar interbody fusion with i    Family Psychiatric History: Reviewed family psychiatric history from progress note on 04/26/2020  Family History:  Family History  Problem Relation Age of Onset    Arthritis Mother    Hyperlipidemia Mother    Migraines Mother    Arthritis Father    Diabetes Father    Heart disease Father    Atrial fibrillation Father    Diabetes Brother    Breast cancer Maternal Grandmother    Mental illness Neg Hx     Social History: Reviewed social history from progress note on 04/26/2020 Social History   Socioeconomic History   Marital status: Divorced    Spouse name: Not on file   Number of children: Not on file   Years of education: Not on file   Highest education level: Bachelor's degree (e.g., BA, AB, BS)  Occupational History   Not on file  Tobacco Use   Smoking status: Former    Packs/day: 0.50    Years: 20.00    Pack years: 10.00    Types: Cigarettes    Quit date: 10/18/2016    Years since quitting: 4.5   Smokeless tobacco: Never  Vaping Use   Vaping Use: Never used  Substance and Sexual Activity   Alcohol use: No   Drug use: No   Sexual activity: Not Currently  Other Topics Concern   Not on file  Social History Narrative   Not on file   Social Determinants of Health   Financial Resource Strain: Not on file  Food Insecurity: Not on file  Transportation Needs: Not on file  Physical Activity: Not on file  Stress: Not on file  Social Connections: Not on file    Allergies:  Allergies  Allergen Reactions   Shellfish Allergy Hives   Flexeril [Cyclobenzaprine] Hives    RAPID HEARTBEAT    Metabolic Disorder Labs: No results found for: HGBA1C, MPG No results found for: PROLACTIN Lab Results  Component Value Date   CHOL 197 02/10/2021   TRIG 173 (H) 02/10/2021   HDL 46 02/10/2021   CHOLHDL 3.6 10/16/2016   LDLCALC 120 (H) 02/10/2021   LDLCALC 124 (H) 04/04/2020   Lab Results  Component Value Date   TSH 0.616 02/10/2021   TSH 1.560 04/04/2020    Therapeutic Level Labs: No results found for: LITHIUM No results found for: VALPROATE No components found for:  CBMZ  Current Medications: Current Outpatient Medications   Medication Sig Dispense Refill   Ascorbic Acid (VITAMIN C PO) Take by mouth.     cholecalciferol (VITAMIN D) 1000 UNITS tablet Take 1,000 Units by mouth daily.     clindamycin (CLEOCIN T) 1 % lotion Apply to affected areas 1-2 times a day until rash improved. 60 mL 1   clobetasol cream (TEMOVATE) 0.05 % Apply to affected areas 1-2 times a day until rash improved. Avoid face, groin, axilla. 60 g 1   COVID-19 mRNA vaccine, Pfizer, 30 MCG/0.3ML injection USE AS DIRECTED .3 mL 0   doxycycline (ADOXA) 100 MG tablet Take 1 tablet (100 mg total) by mouth 2 (two) times daily for 14 days. Take  with food 28 tablet 0   fluticasone (FLONASE) 50 MCG/ACT nasal spray Place 1 spray into both nostrils daily as needed for allergies.      gabapentin (NEURONTIN) 600 MG tablet TAKE 1 TABLET BY MOUTH THREE TIMES DAILY 90 tablet 1   halobetasol (ULTRAVATE) 0.05 % cream Apply topically 2 (two) times daily as needed. Up to 2 weeks. Avoid face, groin, underarms 50 g 2   Halobetasol Prop-Tazarotene (DUOBRII) 0.01-0.045 % LOTN Apply 1 application topically at bedtime. qhs to aa rash on hands and feet until clear, then prn flares, avoid face, groin, axilla 100 g 1   hydrOXYzine (ATARAX/VISTARIL) 10 MG tablet Take 1-3 tablets by mouth every night as needed for itch. 90 tablet 1   meloxicam (MOBIC) 7.5 MG tablet TAKE 1 TABLET BY MOUTH TWICE DAILY 60 tablet 2   metaxalone (SKELAXIN) 800 MG tablet TAKE 1 TABLET BY MOUTH 4 TIMES DAILY AS NEEDED 120 tablet 2   metroNIDAZOLE (FLAGYL) 500 MG tablet Take 1 tablet (500 mg total) by mouth 3 (three) times daily. 30 tablet 0   mirtazapine (REMERON) 7.5 MG tablet Take 1 tablet (7.5 mg total) by mouth at bedtime. 30 tablet 1   montelukast (SINGULAIR) 10 MG tablet TAKE 1 TABLET BY MOUTH AT BEDTIME 90 tablet 3   morphine (KADIAN) 30 MG 24 hr capsule Take 30 mg by mouth daily.      Multiple Vitamin (MULTIVITAMIN ADULT PO) Take by mouth daily.     Multiple Vitamins-Minerals (ZINC PO) Take by  mouth.     oxyCODONE (ROXICODONE) 15 MG immediate release tablet Take 15 mg by mouth every 6 (six) hours as needed.   0   propranolol (INDERAL) 20 MG tablet TAKE 1 TABLET (20 MG TOTAL) BY MOUTH 2 (TWO) TIMES DAILY AS NEEDED FOR SEVERE PANIC ATTACKS 60 tablet 1   Secukinumab, 300 MG Dose, (COSENTYX, 300 MG DOSE,) 150 MG/ML SOSY Inject 300 mg into the skin every 28 days for maintenance 2 mL 5   tacrolimus (PROTOPIC) 0.1 % ointment Apply topically 2 (two) times daily. Apply to feet and hands 100 g 1   triamcinolone cream (KENALOG) 0.1 % Apply 1 application topically 2 (two) times daily. 60 g 6   busPIRone (BUSPAR) 10 MG tablet Take 1 tablet (10 mg total) by mouth daily with breakfast. 30 tablet 1   Dupilumab (DUPIXENT) 300 MG/2ML SOPN Inject 600 mg into the skin once for 1 dose. On day 1. 4 mL 0   Dupilumab (DUPIXENT) 300 MG/2ML SOPN Inject 300 mg into the skin every 14 (fourteen) days. Starting at day 15 for maintenance. 4 mL 5   PARoxetine (PAXIL) 20 MG tablet TAKE 1 & 1/2 TABLETS (30 MG) BY MOUTH DAILY. 45 tablet 1   No current facility-administered medications for this visit.     Musculoskeletal: Strength & Muscle Tone: within normal limits Gait & Station:  walks with a limp Patient leans: Left  Psychiatric Specialty Exam: Review of Systems  Musculoskeletal:  Positive for back pain.  Psychiatric/Behavioral:  Positive for decreased concentration, dysphoric mood and sleep disturbance. The patient is nervous/anxious.   All other systems reviewed and are negative.  Blood pressure 112/70, pulse 78, temperature 98.3 F (36.8 C), temperature source Temporal, weight 163 lb 12.8 oz (74.3 kg), last menstrual period 06/20/2016.Body mass index is 26.44 kg/m.  General Appearance: Casual  Eye Contact:  Good  Speech:  Clear and Coherent  Volume:  Normal  Mood:  Anxious and Depressed  Affect:  Congruent  Thought Process:  Goal Directed and Descriptions of Associations: Intact  Orientation:  Full  (Time, Place, and Person)  Thought Content: Logical   Suicidal Thoughts:  No  Homicidal Thoughts:  No  Memory:  Immediate;   Fair Recent;   Fair Remote;   Fair  Judgement:  Fair  Insight:  Fair  Psychomotor Activity:  Normal  Concentration:  Concentration: Fair and Attention Span: Fair  Recall:  [employer] of Knowledge: Fair  Language: Fair  Akathisia:  No  Handed:  Right  AIMS (if indicated): done  Assets:  Communication Skills Desire for [place] Talents/Skills Transportation Vocational/Educational  ADL's:  Intact  Cognition: WNL  Sleep:  Poor   Screenings: GAD-7    Flowsheet Row Office Visit from 02/10/2021 in [facility] Video Visit from 02/07/2021 in [facility] Video Visit from 11/16/2020 in [facility] Video Visit from 09/23/2020 in [facility] Video Visit from 09/02/2020 in [facility]  Total GAD-7 Score 19 21 10 13 13       PHQ2-9    [facility] Visit from 05/16/2021 in [facility] Visit from 02/10/2021 in [facility] Video Visit from 02/07/2021 in [facility] Video Visit from 10/19/2020 in [facility] Video Visit from 09/23/2020 in [facility]  PHQ-2 Total Score 1 6 5 1 2   PHQ-9 Total Score 9 20 22  -- 7      Flowsheet Row Video Visit from 02/07/2021 in [place] [facility] Video Visit from 10/19/2020 in [facility] No Risk No Risk        Assessment and Plan: Jamie [name] is a 50 year old Caucasian female, divorced, employed, lives in [place], has a history of MDD, GAD, panic attacks, chronic pain was evaluated in office today.  Patient is currently struggling with mood, sleep and situational  stressors as noted above.  She will benefit from following plan.  Plan MDD-unstable Paxil 30 mg p.o. daily with supper Reduce BuSpar to 10 mg p.o. daily with breakfast Start mirtazapine 7.5 mg p.o. nightly. Discussed medication side effects, benefits, serotonin syndrome and compliant with other medications. Discontinue Seroquel for noncompliance and side effects AIMS - 0  GAD-unstable Paxil 30 mg p.o. daily Patient advised to start CBT.  Panic attacks-stable Continue CBT Propranolol as needed for severe panic attacks  Follow-up in clinic in 2 weeks in office.  This note was generated in part or whole with voice recognition software. Voice recognition is usually quite accurate but there are transcription errors that can and very often do occur. I apologize for any typographical errors that were not detected and corrected.       Ursula Alert, MD 05/16/2021, 5:22 PM

## 2021-05-16 NOTE — Patient Instructions (Addendum)
Doxycycline should be taken with food to prevent nausea. Do not lay down for 30 minutes after taking. Be cautious with sun exposure and use good sun protection while on this medication. Pregnant women should not take this medication.   Dupilumab (Dupixent) is a treatment given by injection for adults and children with moderate-to-severe atopic dermatitis. Goal is control of skin condition, not cure. It is given as 2 injections at the first dose followed by 1 injection ever 2 weeks thereafter.  Young children are dosed monthly.  Potential side effects include allergic reaction, herpes infections, injection site reactions and conjunctivitis (inflammation of the eyes).  The use of Dupixent requires long term medication management, including periodic office visits.     If you have any questions or concerns for your doctor, please call our main line at [phone] and press option 4 to reach your doctor's medical assistant. If no one answers, please leave a voicemail as directed and we will return your call as soon as possible. Messages left after 4 pm will be answered the following business day.   You may also send [place] a message via [place]. We typically respond to MyChart messages within 1-2 business days.  For prescription refills, please ask your pharmacy to contact our office. Our fax number is [phone].  If you have an urgent issue when the clinic is closed that cannot wait until the next business day, you can page your doctor at the number below.    Please note that while we do our best to be available for urgent issues outside of office hours, we are not available 24/7.   If you have an urgent issue and are unable to reach [place], you may choose to seek medical care at your doctor's office, retail clinic, urgent care center, or emergency room.  If you have a medical emergency, please immediately call 911 or go to the emergency department.  Pager Numbers  - Dr. Nehemiah Massed: [phone]  -  Dr. Laurence Ferrari: [phone]  - Dr. Nicole Kindred: [phone]  In the event of inclement weather, please call our main line at [phone] for an update on the status of any delays or closures.  Dermatology Medication Tips: Please keep the boxes that topical medications come in in order to help keep track of the instructions about where and how to use these. Pharmacies typically print the medication instructions only on the boxes and not directly on the medication tubes.   If your medication is too expensive, please contact our office at [phone] option 4 or send [place] a message through [place].   We are unable to tell what your co-pay for medications will be in advance as this is different depending on your insurance coverage. However, we may be able to find a substitute medication at lower cost or fill out paperwork to get insurance to cover a needed medication.   If a prior authorization is required to get your medication covered by your insurance company, please allow [place] 1-2 business days to complete this process.  Drug prices often vary depending on where the prescription is filled and some pharmacies may offer cheaper prices.  The website www.goodrx.com contains coupons for medications through different pharmacies. The prices here do not account for what the cost may be with help from insurance (it may be cheaper with your insurance), but the website can give you the price if you did not use any insurance.  - You can print the associated coupon and take it with your prescription to  the pharmacy.  - You may also stop by our office during regular business hours and pick up a GoodRx coupon card.  - If you need your prescription sent electronically to a different pharmacy, notify our office through [facility] or by phone at [phone] option 4.

## 2021-05-17 ENCOUNTER — Other Ambulatory Visit: Payer: Self-pay

## 2021-05-18 ENCOUNTER — Other Ambulatory Visit: Payer: Self-pay

## 2021-05-18 ENCOUNTER — Ambulatory Visit (INDEPENDENT_AMBULATORY_CARE_PROVIDER_SITE_OTHER): Payer: 59

## 2021-05-18 DIAGNOSIS — L239 Allergic contact dermatitis, unspecified cause: Secondary | ICD-10-CM | POA: Diagnosis not present

## 2021-05-18 DIAGNOSIS — L230 Allergic contact dermatitis due to metals: Secondary | ICD-10-CM

## 2021-05-18 NOTE — Progress Notes (Signed)
Patient here today for day 3 of patch test reading. Panels removed from back. Patient showed positive reaction to site #28 for gold and questionable to site #30.  Patient advised how to read until follow up with Dr. Nicole Kindred next Tuesday. Also advised do not soak, nor scrub patch testing area. Patient to return to office in 5 days. All questions answered.

## 2021-05-22 ENCOUNTER — Encounter: Payer: Self-pay | Admitting: Family Medicine

## 2021-05-22 ENCOUNTER — Other Ambulatory Visit (HOSPITAL_COMMUNITY): Payer: Self-pay

## 2021-05-23 ENCOUNTER — Other Ambulatory Visit: Payer: Self-pay

## 2021-05-23 ENCOUNTER — Other Ambulatory Visit (HOSPITAL_COMMUNITY): Payer: Self-pay

## 2021-05-23 ENCOUNTER — Telehealth: Payer: Self-pay | Admitting: Pharmacist

## 2021-05-23 ENCOUNTER — Ambulatory Visit (INDEPENDENT_AMBULATORY_CARE_PROVIDER_SITE_OTHER): Payer: 59 | Admitting: Dermatology

## 2021-05-23 DIAGNOSIS — L258 Unspecified contact dermatitis due to other agents: Secondary | ICD-10-CM | POA: Diagnosis not present

## 2021-05-23 DIAGNOSIS — L209 Atopic dermatitis, unspecified: Secondary | ICD-10-CM

## 2021-05-23 DIAGNOSIS — L309 Dermatitis, unspecified: Secondary | ICD-10-CM

## 2021-05-23 MED ORDER — DUPIXENT 300 MG/2ML ~~LOC~~ SOAJ
600.0000 mg | SUBCUTANEOUS | 1 refills | Status: DC
Start: 2021-05-16 — End: 2021-05-25
  Filled 2021-05-23: qty 4, 30d supply, fill #0
  Filled 2021-05-23: qty 4, 28d supply, fill #0
  Filled 2021-05-23: qty 2, 28d supply, fill #0

## 2021-05-23 MED ORDER — DUPILUMAB 300 MG/2ML ~~LOC~~ SOSY
300.0000 mg | PREFILLED_SYRINGE | Freq: Once | SUBCUTANEOUS | Status: AC
  Administered 2021-05-23: 300 mg via SUBCUTANEOUS

## 2021-05-23 NOTE — Patient Instructions (Signed)
Topical steroids (such as triamcinolone, fluocinolone, fluocinonide, mometasone, clobetasol, halobetasol, betamethasone, hydrocortisone) can cause thinning and lightening of the skin if they are used for too long in the same area. Your physician has selected the right strength medicine for your problem and area affected on the body. Please use your medication only as directed by your physician to prevent side effects.   Dupilumab (Dupixent) is a treatment given by injection for adults and children with moderate-to-severe atopic dermatitis. Goal is control of skin condition, not cure. It is given as 2 injections at the first dose followed by 1 injection ever 2 weeks thereafter.  Young children are dosed monthly.  Potential side effects include allergic reaction, herpes infections, injection site reactions and conjunctivitis (inflammation of the eyes).  The use of Dupixent requires long term medication management, including periodic office visits.  If you have any questions or concerns for your doctor, please call our main line at [phone] and press option 4 to reach your doctor's medical assistant. If no one answers, please leave a voicemail as directed and we will return your call as soon as possible. Messages left after 4 pm will be answered the following business day.   You may also send [place] a message via [place]. We typically respond to MyChart messages within 1-2 business days.  For prescription refills, please ask your pharmacy to contact our office. Our fax number is [phone].  If you have an urgent issue when the clinic is closed that cannot wait until the next business day, you can page your doctor at the number below.    Please note that while we do our best to be available for urgent issues outside of office hours, we are not available 24/7.   If you have an urgent issue and are unable to reach [place], you may choose to seek medical care at your doctor's office, retail clinic, urgent  care center, or emergency room.  If you have a medical emergency, please immediately call 911 or go to the emergency department.  Pager Numbers  - Dr. Nehemiah Massed: [phone]  - Dr. Laurence Ferrari: [phone]  - Dr. Nicole Kindred: [phone]  In the event of inclement weather, please call our main line at [phone] for an update on the status of any delays or closures.  Dermatology Medication Tips: Please keep the boxes that topical medications come in in order to help keep track of the instructions about where and how to use these. Pharmacies typically print the medication instructions only on the boxes and not directly on the medication tubes.   If your medication is too expensive, please contact our office at [phone] option 4 or send [place] a message through [place].   We are unable to tell what your co-pay for medications will be in advance as this is different depending on your insurance coverage. However, we may be able to find a substitute medication at lower cost or fill out paperwork to get insurance to cover a needed medication.   If a prior authorization is required to get your medication covered by your insurance company, please allow [place] 1-2 business days to complete this process.  Drug prices often vary depending on where the prescription is filled and some pharmacies may offer cheaper prices.  The website www.goodrx.com contains coupons for medications through different pharmacies. The prices here do not account for what the cost may be with help from insurance (it may be cheaper with your insurance), but the website can give you the price if you  did not use any insurance.  - You can print the associated coupon and take it with your prescription to the pharmacy.  - You may also stop by our office during regular business hours and pick up a GoodRx coupon card.  - If you need your prescription sent electronically to a different pharmacy, notify our office through [facility] or  by phone at [phone] option 4.

## 2021-05-23 NOTE — Progress Notes (Signed)
   Follow-Up Visit   Subjective  Jamie [name] is a 50 y.o. female who presents for the following: Follow-up (Patient here today for 1 week patch test follow up. Patient showed positive reaction to site #28 for gold and questionable to site #30 at last visit. She advises she is still itching, currently using clobetasol cream and taking hydroxyzine. She has tacrolimus at pharmacy but has not picked up yet. ).  She had been treated with Cosentyx for psoriasis, but no improvement noted.  Patient advises on Saturday #'s 28, 35, 36 and a little at 25 showed some redness and itched. Today she shows reactions to #1 Nickel and #28 Gold.   The following portions of the chart were reviewed this encounter and updated as appropriate:       Review of Systems:  No other skin or systemic complaints except as noted in HPI or Assessment and Plan.  Objective  Well appearing patient in no apparent distress; mood and affect are within normal limits.  A focused examination was performed including back, hands. Relevant physical exam findings are noted in the Assessment and Plan.  bilateral hands Erythema and hyperkeratosis with microvesicles and crusted excoriations at fingertips and entire palms of BL hands  Patch test positive 2+ to Nickel and Gold, rest negative   Assessment & Plan  Dermatitis bilateral hands  Contact dermatitis- Gold and Nickel proven allergens, probably not relevant to current outbreak.  Info sheets given. Pt will avoid exposure.  Atopic Dermatitis Start tacrolimus 0.1% ointment twice daily.  Continue clobetasol 0.05% cream 1-2 times daily. Avoid applying to face, groin, and axilla. Use as directed. Risk of skin atrophy with long-term use reviewed.  Continue hydroxyzine 10mg  take 1-3 po qhs   Start Dupixent injections today. Loading dose given today with samples x 2  Patient has already had prescription sent in for [place] and has already been contacted about an approved prior  authorization.   Topical steroids (such as triamcinolone, fluocinolone, fluocinonide, mometasone, clobetasol, halobetasol, betamethasone, hydrocortisone) can cause thinning and lightening of the skin if they are used for too long in the same area. Your physician has selected the right strength medicine for your problem and area affected on the body. Please use your medication only as directed by your physician to prevent side effects.   Dupilumab (Dupixent) is a treatment given by injection for adults and children with moderate-to-severe atopic dermatitis. Goal is control of skin condition, not cure. It is given as 2 injections at the first dose followed by 1 injection ever 2 weeks thereafter.  Young children are dosed monthly.  Potential side effects include allergic reaction, herpes infections, injection site reactions and conjunctivitis (inflammation of the eyes).  The use of Dupixent requires long term medication management, including periodic office visits.   Related Medications dupilumab (DUPIXENT) prefilled syringe 300 mg   Return in about 4 weeks (around 06/20/2021) for Dermatitis.  Graciella Belton, RMA, am acting as scribe for [name], MD .  Documentation: I have reviewed the above documentation for accuracy and completeness, and I agree with the above.  [name] MD

## 2021-05-23 NOTE — Telephone Encounter (Signed)
Called patient to schedule an appointment for the [employer] [facility]. I was unable to reach the patient so I left a HIPAA-compliant message requesting that the patient return my call.   Luke Van Ausdall, PharmD, BCACP, CPP Clinical Pharmacist [facility] [phone]  

## 2021-05-24 ENCOUNTER — Other Ambulatory Visit (HOSPITAL_COMMUNITY): Payer: Self-pay

## 2021-05-25 ENCOUNTER — Other Ambulatory Visit (HOSPITAL_COMMUNITY): Payer: Self-pay

## 2021-05-25 ENCOUNTER — Other Ambulatory Visit: Payer: Self-pay

## 2021-05-25 ENCOUNTER — Other Ambulatory Visit: Payer: Self-pay | Admitting: Pharmacist

## 2021-05-25 ENCOUNTER — Ambulatory Visit: Payer: 59 | Attending: Family Medicine | Admitting: Pharmacist

## 2021-05-25 DIAGNOSIS — Z79899 Other long term (current) drug therapy: Secondary | ICD-10-CM

## 2021-05-25 DIAGNOSIS — L309 Dermatitis, unspecified: Secondary | ICD-10-CM

## 2021-05-25 MED ORDER — DUPIXENT 300 MG/2ML ~~LOC~~ SOAJ
300.0000 mg | SUBCUTANEOUS | 5 refills | Status: DC
  Filled 2021-05-25 – 2021-05-26 (×2): qty 4, 28d supply, fill #0
  Filled 2021-06-12 – 2021-06-21 (×3): qty 4, 28d supply, fill #1
  Filled 2021-07-12: qty 4, 28d supply, fill #2
  Filled 2021-07-31 – 2021-08-07 (×2): qty 4, 28d supply, fill #3
  Filled 2021-09-05: qty 4, 28d supply, fill #4
  Filled 2021-10-03: qty 4, 28d supply, fill #5

## 2021-05-25 MED ORDER — DUPIXENT 300 MG/2ML ~~LOC~~ SOAJ
600.0000 mg | SUBCUTANEOUS | 1 refills | Status: DC
  Filled 2021-05-25: qty 4, fill #0

## 2021-05-25 NOTE — Progress Notes (Signed)
   S: Patient presents for review of their specialty medication therapy.  Patient is currently taking Dupixent for atopic dermatitis. Patient is managed by Dr. Nicole Kindred for this.   Adherence: confirms. Had her 600 mg LD earlier this week and tolerated it well.   Efficacy: still a little too early to tell.   Dosing: 600 mg x1 followed by 300 mg q2weeks  Dose adjustments: Renal: no dose adjustments (has not been studied) Hepatic: no dose adjustments (has not been studied)  Drug-drug interactions: none  Screening: TB test: completed  Monitoring: S/sx of infection: none S/sx of hypersensitivity: none S/sx of ocular effects: none S/sx of eosinophilia/vasculitis: none  O:  Lab Results  Component Value Date   WBC 7.8 02/10/2021   HGB 12.6 02/10/2021   HCT 37.3 02/10/2021   MCV 97 02/10/2021   PLT 273 02/10/2021      Chemistry      Component Value Date/Time   NA 142 02/10/2021 1626   K 4.2 02/10/2021 1626   CL 106 02/10/2021 1626   CO2 23 02/10/2021 1626   BUN 10 02/10/2021 1626   CREATININE 0.68 02/10/2021 1626      Component Value Date/Time   CALCIUM 9.4 02/10/2021 1626   ALKPHOS 74 02/10/2021 1626   AST 17 02/10/2021 1626   ALT 11 02/10/2021 1626   BILITOT <0.2 02/10/2021 1626       A/P: 1. Medication review: Patient currently on [place] for atopic dermatitis. Reviewed the medication with the patient, including the following: Dupixent is a monoclonal antibody used for the treatment of asthma or atopic dermatitis. Patient educated on purpose, proper use and potential adverse effects of Dupixent. Possible adverse effects include increased risk of infection, ocular effects, vasculitis/eosinophilia, and hypersensitivity reactions. Administer as a SubQ injection and rotate sites. Allow the medication to reach room temp prior to administration (45 mins for 300 mg syringe or 30 min for 200 mg syringe). Do not shake. Discard any unused portion. No recommendations for any  changes.     Benard Halsted, PharmD, [facility], [place] [phone]

## 2021-05-26 ENCOUNTER — Other Ambulatory Visit (HOSPITAL_COMMUNITY): Payer: Self-pay

## 2021-05-30 ENCOUNTER — Other Ambulatory Visit: Payer: Self-pay

## 2021-05-30 ENCOUNTER — Other Ambulatory Visit (HOSPITAL_COMMUNITY): Payer: Self-pay

## 2021-05-31 ENCOUNTER — Other Ambulatory Visit: Payer: Self-pay

## 2021-05-31 ENCOUNTER — Encounter: Payer: Self-pay | Admitting: Psychiatry

## 2021-05-31 ENCOUNTER — Telehealth (INDEPENDENT_AMBULATORY_CARE_PROVIDER_SITE_OTHER): Payer: 59 | Admitting: Psychiatry

## 2021-05-31 DIAGNOSIS — F331 Major depressive disorder, recurrent, moderate: Secondary | ICD-10-CM

## 2021-05-31 DIAGNOSIS — F410 Panic disorder [episodic paroxysmal anxiety] without agoraphobia: Secondary | ICD-10-CM | POA: Diagnosis not present

## 2021-05-31 DIAGNOSIS — F411 Generalized anxiety disorder: Secondary | ICD-10-CM | POA: Diagnosis not present

## 2021-05-31 MED ORDER — MIRTAZAPINE 15 MG PO TABS: 15.0000 mg | ORAL_TABLET | Freq: Every day | ORAL | 1 refills | Status: DC

## 2021-05-31 NOTE — Progress Notes (Signed)
Virtual Visit via Video Note  I connected with Jamie [name] on 05/31/21 at  4:30 PM EDT by a video enabled telemedicine application and verified that I am speaking with the correct person using two identifiers.  Location Provider Location : [facility] Patient Location : Home  Participants: Patient , Provider   I discussed the limitations of evaluation and management by telemedicine and the availability of in person appointments. The patient expressed understanding and agreed to proceed.    I discussed the assessment and treatment plan with the patient. The patient was provided an opportunity to ask questions and all were answered. The patient agreed with the plan and demonstrated an understanding of the instructions.   The patient was advised to call back or seek an in-person evaluation if the symptoms worsen or if the condition fails to improve as anticipated.                                                           [facility] MD OP Progress Note  05/31/2021 4:42 PM Vassie Kugel  MRN:  [id number]  Chief Complaint:  Chief Complaint   Follow-up; Anxiety; Depression    HPI: Jamie [name] is a 50 year old Caucasian female, employed, divorced, lives in [place], has a history of MDD, panic attacks, GAD, chronic pain was evaluated by telemedicine today.  Patient today reports she has noticed some improvement in her sleep since the past few days.  Patient however continues to be anxious and depressed.  She reports nothing much has changed with her mood.  She continues to have multiple psychosocial stressors especially at work.  Several staff left or is currently on leave of absence and her workload is high.  Patient reports that does take a toll on her mental health.  Patient also reports she is worried about her mother who currently struggles with hip pain and is not interested in surgery.  Patient denies any suicidality, homicidality or perceptual disturbances.  She denies side effects to  medications and reports she is compliant on them.  She has not been able to find a therapist yet however reports she is actively working to establish care.  Patient denies any other concerns today.  Visit Diagnosis:    ICD-10-CM   1. MDD (major depressive disorder), recurrent episode, moderate (HCC)  F33.1 mirtazapine (REMERON) 15 MG tablet    2. GAD (generalized anxiety disorder)  F41.1 mirtazapine (REMERON) 15 MG tablet    3. Panic attack  F41.0       Past Psychiatric History: Reviewed past psychiatric history from progress note on 04/26/2020.  Past trials of medications-Trintellix extensive, Seroquel, Abilify, Effexor, sertraline, Cymbalta-bruxism, Belsomra, trazodone, Ambien, Klonopin  Past Medical History:  Past Medical History:  Diagnosis Date   Anxiety    takes Ativan daily.  Panic Attack   Arthritis    Chronic back pain    herniated disc/stenosis/scoliosis   Depression    takes Effexor daily   Eczema    uses a cream daily as needed   Family history of adverse reaction to anesthesia    pta dad is very hard to wake up;excessive nausea   History of bronchitis    > 58yr ago   Hx of dysplastic nevus 05/10/2020   neck - posterior, Severe Atypia. Shave removal 07/18/2020, margins free  Hypoglycemia    Insomnia    takes Lorazepam nightly   Joint pain    PONV (postoperative nausea and vomiting)    Seasonal allergies    uses Flonase daily   Vitamin D deficiency    takes Vit D daily    Past Surgical History:  Procedure Laterality Date   BACK SURGERY  2008/2012   laminectomy 1st time and 2nd time laminectomy and fusion   COLONOSCOPY WITH PROPOFOL N/A 05/27/2020   Procedure: COLONOSCOPY WITH PROPOFOL;  Surgeon: Virgel Manifold, MD;  Location: [facility] ENDOSCOPY;  Service: Gastroenterology;  Laterality: N/A;  COVID POSITIVE AUGUST 16   MAXIMUM ACCESS (MAS)POSTERIOR LUMBAR INTERBODY FUSION (PLIF) 2 LEVEL N/A 09/01/2014   Procedure: Lumbar four-five, Lumbar five-Sacral one  Maximum Access Surgery posterior lumbar interbody fusion with interbody prosthesis posterior lateral arthrodesis posterior segmental instrumentation;  Surgeon: Elaina Hoops, MD;  Location: [facility];  Service: Neurosurgery;  Laterality: N/A;  Lumbar four-five, Lumbar five-Sacral one Maximum Access Surgery posterior lumbar interbody fusion with i    Family Psychiatric History: Reviewed family psychiatric history from progress note on 04/26/2020  Family History:  Family History  Problem Relation Age of Onset   Arthritis Mother    Hyperlipidemia Mother    Migraines Mother    Arthritis Father    Diabetes Father    Heart disease Father    Atrial fibrillation Father    Diabetes Brother    Breast cancer Maternal Grandmother    Mental illness Neg Hx     Social History: Reviewed social history from progress note on 04/26/2020 Social History   Socioeconomic History   Marital status: Divorced    Spouse name: Not on file   Number of children: Not on file   Years of education: Not on file   Highest education level: Bachelor's degree (e.g., BA, AB, BS)  Occupational History   Not on file  Tobacco Use   Smoking status: Former    Packs/day: 0.50    Years: 20.00    Pack years: 10.00    Types: Cigarettes    Quit date: 10/18/2016    Years since quitting: 4.6   Smokeless tobacco: Never  Vaping Use   Vaping Use: Never used  Substance and Sexual Activity   Alcohol use: No   Drug use: No   Sexual activity: Not Currently  Other Topics Concern   Not on file  Social History Narrative   Not on file   Social Determinants of Health   Financial Resource Strain: Not on file  Food Insecurity: Not on file  Transportation Needs: Not on file  Physical Activity: Not on file  Stress: Not on file  Social Connections: Not on file    Allergies:  Allergies  Allergen Reactions   Shellfish Allergy Hives   Flexeril [Cyclobenzaprine] Hives    RAPID HEARTBEAT    Metabolic Disorder Labs: No  results found for: HGBA1C, MPG No results found for: PROLACTIN Lab Results  Component Value Date   CHOL 197 02/10/2021   TRIG 173 (H) 02/10/2021   HDL 46 02/10/2021   CHOLHDL 3.6 10/16/2016   LDLCALC 120 (H) 02/10/2021   LDLCALC 124 (H) 04/04/2020   Lab Results  Component Value Date   TSH 0.616 02/10/2021   TSH 1.560 04/04/2020    Therapeutic Level Labs: No results found for: LITHIUM No results found for: VALPROATE No components found for:  CBMZ  Current Medications: Current Outpatient Medications  Medication Sig Dispense Refill   mirtazapine (  REMERON) 15 MG tablet Take 1 tablet (15 mg total) by mouth at bedtime. 30 tablet 1   Ascorbic Acid (VITAMIN C PO) Take by mouth.     busPIRone (BUSPAR) 10 MG tablet Take 1 tablet (10 mg total) by mouth daily with breakfast. 30 tablet 1   cholecalciferol (VITAMIN D) 1000 UNITS tablet Take 1,000 Units by mouth daily.     clindamycin (CLEOCIN T) 1 % lotion Apply to affected areas 1-2 times a day until rash improved. 60 mL 1   clobetasol cream (TEMOVATE) 0.05 % Apply to affected areas 1-2 times a day until rash improved. Avoid face, groin, axilla. 60 g 1   COVID-19 mRNA vaccine, Pfizer, 30 MCG/0.3ML injection USE AS DIRECTED .3 mL 0   Dupilumab (DUPIXENT) 300 MG/2ML SOPN Inject 300 mg into the skin every 14 (fourteen) days. Starting at day 15 for maintenance. 4 mL 5   fluticasone (FLONASE) 50 MCG/ACT nasal spray Place 1 spray into both nostrils daily as needed for allergies.      gabapentin (NEURONTIN) 600 MG tablet TAKE 1 TABLET BY MOUTH THREE TIMES DAILY 90 tablet 1   Halobetasol Prop-Tazarotene (DUOBRII) 0.01-0.045 % LOTN Apply 1 application topically at bedtime. qhs to aa rash on hands and feet until clear, then prn flares, avoid face, groin, axilla 100 g 1   hydrOXYzine (ATARAX/VISTARIL) 10 MG tablet Take 1-3 tablets by mouth every night as needed for itch. 90 tablet 1   meloxicam (MOBIC) 7.5 MG tablet TAKE 1 TABLET BY MOUTH TWICE DAILY 60  tablet 2   metaxalone (SKELAXIN) 800 MG tablet TAKE 1 TABLET BY MOUTH 4 TIMES DAILY AS NEEDED 120 tablet 2   metroNIDAZOLE (FLAGYL) 500 MG tablet Take 1 tablet (500 mg total) by mouth 3 (three) times daily. 30 tablet 0   montelukast (SINGULAIR) 10 MG tablet TAKE 1 TABLET BY MOUTH AT BEDTIME 90 tablet 3   morphine (KADIAN) 30 MG 24 hr capsule Take 30 mg by mouth daily.      Multiple Vitamin (MULTIVITAMIN ADULT PO) Take by mouth daily.     Multiple Vitamins-Minerals (ZINC PO) Take by mouth.     oxyCODONE (ROXICODONE) 15 MG immediate release tablet Take 15 mg by mouth every 6 (six) hours as needed.   0   PARoxetine (PAXIL) 20 MG tablet TAKE 1 & 1/2 TABLETS (30 MG) BY MOUTH DAILY. 45 tablet 1   propranolol (INDERAL) 20 MG tablet TAKE 1 TABLET (20 MG TOTAL) BY MOUTH 2 (TWO) TIMES DAILY AS NEEDED FOR SEVERE PANIC ATTACKS 60 tablet 1   Secukinumab, 300 MG Dose, (COSENTYX, 300 MG DOSE,) 150 MG/ML SOSY Inject 300 mg into the skin every 28 days for maintenance 2 mL 5   tacrolimus (PROTOPIC) 0.1 % ointment Apply topically 2 (two) times daily. Apply to feet and hands 100 g 1   triamcinolone cream (KENALOG) 0.1 % Apply 1 application topically 2 (two) times daily. 60 g 6   No current facility-administered medications for this visit.     Musculoskeletal: Strength & Muscle Tone:  UTA Gait & Station:  WNL Patient leans: N/A  Psychiatric Specialty Exam: Review of Systems  Musculoskeletal:  Positive for back pain.  Psychiatric/Behavioral:  Positive for sleep disturbance. The patient is nervous/anxious.   All other systems reviewed and are negative.  Last menstrual period 06/20/2016.There is no height or weight on file to calculate BMI.  General Appearance: Casual  Eye Contact:  Fair  Speech:  Clear and Coherent  Volume:  Normal  Mood:  Anxious  Affect:  Congruent  Thought Process:  Goal Directed and Descriptions of Associations: Intact  Orientation:  Full (Time, Place, and Person)  Thought Content:  Logical   Suicidal Thoughts:  No  Homicidal Thoughts:  No  Memory:  Immediate;   Fair Recent;   Fair Remote;   Fair  Judgement:  Fair  Insight:  Fair  Psychomotor Activity:  Normal  Concentration:  Concentration: Fair and Attention Span: Fair  Recall:  [employer] of Knowledge: Fair  Language: Fair  Akathisia:  No  Handed:  Right  AIMS (if indicated): done  Assets:  Communication Skills Desire for [place] Talents/Skills Transportation Vocational/Educational  ADL's:  Intact  Cognition: WNL  Sleep:  Poor   Screenings: GAD-7    Flowsheet Row Office Visit from 02/10/2021 in [facility] Video Visit from 02/07/2021 in [facility] Video Visit from 11/16/2020 in [facility] Video Visit from 09/23/2020 in [facility] Video Visit from 09/02/2020 in [facility]  Total GAD-7 Score 19 21 10 13 13       PHQ2-9    Flowsheet Row Video Visit from 05/31/2021 in [facility] Office Visit from 05/16/2021 in [facility] Office Visit from 02/10/2021 in [facility] Video Visit from 02/07/2021 in [facility] Video Visit from 10/19/2020 in [facility]  PHQ-2 Total Score 4 1 6 5 1   PHQ-9 Total Score 12 9 20 22  --      Flowsheet Row Video Visit from 02/07/2021 in [facility] Video Visit from 10/19/2020 in [facility] No Risk No Risk        Assessment and Plan: Chanie Soucek is a 50 year old Caucasian female, divorced, employed, lives in [place], has a history of MDD, GAD, panic attacks, chronic pain was evaluated by telemedicine today.  Patient is currently struggling with mood, although reports sleep is improving.  Will benefit from further medication  readjustment.  Plan as noted below.  Plan MDD-unstable Increase mirtazapine to 15 mg p.o. nightly BuSpar at reduced dosage of 10 mg p.o. daily with breakfast Paxil 30 mg p.o. daily with supper  GAD-unstable Paxil 30 mg p.o. daily Patient advised to establish care with a psychotherapist, she is motivated to do so.  Panic attacks-stable Propranolol as needed for severe panic attacks  Follow-up in clinic in 4 weeks or sooner if needed.  This note was generated in part or whole with voice recognition software. Voice recognition is usually quite accurate but there are transcription errors that can and very often do occur. I apologize for any typographical errors that were not detected and corrected.     Ursula Alert, MD 06/01/2021, 9:53 AM

## 2021-06-12 ENCOUNTER — Other Ambulatory Visit (HOSPITAL_COMMUNITY): Payer: Self-pay

## 2021-06-13 ENCOUNTER — Other Ambulatory Visit: Payer: Self-pay

## 2021-06-16 ENCOUNTER — Other Ambulatory Visit (HOSPITAL_COMMUNITY): Payer: Self-pay

## 2021-06-21 ENCOUNTER — Other Ambulatory Visit (HOSPITAL_COMMUNITY): Payer: Self-pay

## 2021-06-27 ENCOUNTER — Ambulatory Visit: Payer: 59 | Admitting: Dermatology

## 2021-06-28 ENCOUNTER — Other Ambulatory Visit: Payer: Self-pay

## 2021-06-28 ENCOUNTER — Telehealth (INDEPENDENT_AMBULATORY_CARE_PROVIDER_SITE_OTHER): Payer: 59 | Admitting: Psychiatry

## 2021-06-28 DIAGNOSIS — F331 Major depressive disorder, recurrent, moderate: Secondary | ICD-10-CM

## 2021-06-28 NOTE — Progress Notes (Signed)
Patient's dad had a heart attack, she is currently at the hospital.  Patient advised to call back to reschedule this appointment at her convenience.

## 2021-06-29 ENCOUNTER — Other Ambulatory Visit: Payer: Self-pay | Admitting: Sports Medicine

## 2021-06-29 ENCOUNTER — Other Ambulatory Visit (HOSPITAL_BASED_OUTPATIENT_CLINIC_OR_DEPARTMENT_OTHER): Payer: Self-pay | Admitting: Sports Medicine

## 2021-06-29 DIAGNOSIS — M7541 Impingement syndrome of right shoulder: Secondary | ICD-10-CM | POA: Diagnosis not present

## 2021-06-29 DIAGNOSIS — M25511 Pain in right shoulder: Secondary | ICD-10-CM

## 2021-06-29 DIAGNOSIS — G8929 Other chronic pain: Secondary | ICD-10-CM

## 2021-06-29 DIAGNOSIS — M19011 Primary osteoarthritis, right shoulder: Secondary | ICD-10-CM

## 2021-06-29 DIAGNOSIS — M7551 Bursitis of right shoulder: Secondary | ICD-10-CM | POA: Diagnosis not present

## 2021-06-29 DIAGNOSIS — M778 Other enthesopathies, not elsewhere classified: Secondary | ICD-10-CM

## 2021-06-30 ENCOUNTER — Other Ambulatory Visit: Payer: Self-pay

## 2021-06-30 ENCOUNTER — Other Ambulatory Visit: Payer: Self-pay | Admitting: Psychiatry

## 2021-06-30 ENCOUNTER — Encounter: Payer: Self-pay | Admitting: Pharmacist

## 2021-06-30 DIAGNOSIS — F411 Generalized anxiety disorder: Secondary | ICD-10-CM

## 2021-06-30 MED FILL — Buspirone HCl Tab 10 MG: ORAL | 30 days supply | Qty: 30 | Fill #0 | Status: CN

## 2021-06-30 NOTE — Telephone Encounter (Signed)
Spoke to pharmacist , they did receive the BuSpar however patient is not due for it yet-prescription will only be filled on 07/07/2021.  Pharmacist to communicate this with patient.

## 2021-06-30 NOTE — Telephone Encounter (Signed)
pt called states pharmacy still did not receive refill .

## 2021-07-04 ENCOUNTER — Telehealth: Payer: Self-pay

## 2021-07-04 ENCOUNTER — Other Ambulatory Visit: Payer: Self-pay

## 2021-07-04 NOTE — Telephone Encounter (Signed)
left message that she only suppose to be taking once a day and not twice and that per the pharmacy she can not get until 07-07-21

## 2021-07-04 NOTE — Telephone Encounter (Signed)
Please call pharmacy and verify , thank you

## 2021-07-04 NOTE — Telephone Encounter (Signed)
Patient is not on BuSpar twice a day, she is only taking BuSpar once daily.  I sent a prescription to the pharmacy on 06/30/2021.  When I received a message from patient that they I myself contacted the pharmacist at [facility].  I spoke to the pharmacist who told me that she did receive the BuSpar that I sent in that day however her insurance will not pay for it until 07/07/2021.  And that is the only reason they were not feeling it.  So please let  patient know she is not taking twice a day and her BuSpar dosages once daily.

## 2021-07-04 NOTE — Telephone Encounter (Signed)
spoke with pharmacy she states that they have a old rx that they was going to fill for patient but she states she takin buspirone twice a day and not once. so they did n't fill the old rx and no they didn;t get a new rx.

## 2021-07-04 NOTE — Telephone Encounter (Signed)
pharmacy did not received rx for buspirone

## 2021-07-07 ENCOUNTER — Other Ambulatory Visit: Payer: Self-pay

## 2021-07-07 MED FILL — Buspirone HCl Tab 10 MG: ORAL | 30 days supply | Qty: 30 | Fill #0 | Status: AC

## 2021-07-10 ENCOUNTER — Other Ambulatory Visit: Payer: Self-pay

## 2021-07-10 ENCOUNTER — Ambulatory Visit: Payer: Self-pay

## 2021-07-10 ENCOUNTER — Other Ambulatory Visit (HOSPITAL_COMMUNITY): Payer: Self-pay

## 2021-07-10 MED ORDER — LORAZEPAM 0.5 MG PO TABS
ORAL_TABLET | ORAL | 0 refills | Status: DC
  Filled 2021-07-10: qty 2, 1d supply, fill #0

## 2021-07-10 MED ORDER — LORAZEPAM 0.5 MG PO TABS: ORAL_TABLET | ORAL | 0 refills | Status: DC

## 2021-07-10 NOTE — Telephone Encounter (Signed)
Routing to provider as an FYI

## 2021-07-10 NOTE — Telephone Encounter (Signed)
Pt called back and stated that last time she had MRI. She was prescribed Ativan 0.5 mg x 2 pills. Pt took 1pill 1 hour before MRI and the other pill at the MRI center. Routing note to office. Please send to [employer].

## 2021-07-10 NOTE — Telephone Encounter (Signed)
Patient called, left VM to return the call to the office to discuss symptoms with a nurse.    Summary: MRI med request   The patient is scheduled to have an MRI tomorrow 07/11/21 and would lie to be prescribed something to help with their discomfort during the procedure   The patient shares that they have been previously prescribed LORazepam (ATIVAN) 0.5 MG tablet [[id number]] by their PCP for similar situations   The patient would like the medication submitted to  [facility], [address]  [address] [place], [place] [address]  Phone:  [phone]  Fax:  [phone]   Please contact further when possible

## 2021-07-10 NOTE — Telephone Encounter (Signed)
Rx sent 

## 2021-07-10 NOTE — Addendum Note (Signed)
Addended by: Valerie Roys on: 07/10/2021 02:46 PM   Modules accepted: Orders

## 2021-07-11 ENCOUNTER — Ambulatory Visit
Admission: RE | Admit: 2021-07-11 | Discharge: 2021-07-11 | Disposition: A | Discharge status: Home / Self Care | Payer: 59 | Source: Ambulatory Visit | Attending: Sports Medicine | Admitting: Sports Medicine

## 2021-07-11 ENCOUNTER — Other Ambulatory Visit: Payer: Self-pay

## 2021-07-11 DIAGNOSIS — M25511 Pain in right shoulder: Secondary | ICD-10-CM | POA: Diagnosis not present

## 2021-07-11 DIAGNOSIS — M7541 Impingement syndrome of right shoulder: Secondary | ICD-10-CM | POA: Diagnosis not present

## 2021-07-11 DIAGNOSIS — M7591 Shoulder lesion, unspecified, right shoulder: Secondary | ICD-10-CM

## 2021-07-11 DIAGNOSIS — M7551 Bursitis of right shoulder: Secondary | ICD-10-CM

## 2021-07-11 DIAGNOSIS — G8929 Other chronic pain: Secondary | ICD-10-CM

## 2021-07-11 DIAGNOSIS — M19011 Primary osteoarthritis, right shoulder: Secondary | ICD-10-CM

## 2021-07-11 DIAGNOSIS — S46011A Strain of muscle(s) and tendon(s) of the rotator cuff of right shoulder, initial encounter: Secondary | ICD-10-CM | POA: Diagnosis not present

## 2021-07-11 DIAGNOSIS — M778 Other enthesopathies, not elsewhere classified: Secondary | ICD-10-CM | POA: Diagnosis not present

## 2021-07-12 ENCOUNTER — Other Ambulatory Visit (HOSPITAL_COMMUNITY): Payer: Self-pay

## 2021-07-17 ENCOUNTER — Other Ambulatory Visit (HOSPITAL_COMMUNITY): Payer: Self-pay

## 2021-07-17 ENCOUNTER — Ambulatory Visit: Payer: 59 | Admitting: Family Medicine

## 2021-07-18 ENCOUNTER — Other Ambulatory Visit: Payer: Self-pay

## 2021-07-18 ENCOUNTER — Encounter: Payer: Self-pay | Admitting: Nurse Practitioner

## 2021-07-18 ENCOUNTER — Telehealth: Payer: Self-pay | Admitting: Family Medicine

## 2021-07-18 ENCOUNTER — Other Ambulatory Visit
Admission: RE | Admit: 2021-07-18 | Discharge: 2021-07-18 | Disposition: A | Discharge status: Home / Self Care | Payer: 59 | Source: Ambulatory Visit | Attending: Sports Medicine | Admitting: Sports Medicine

## 2021-07-18 ENCOUNTER — Ambulatory Visit: Payer: 59 | Admitting: Nurse Practitioner

## 2021-07-18 VITALS — BP 115/71 | HR 69 | Temp 98.7°F | Wt 165.2 lb

## 2021-07-18 DIAGNOSIS — M67911 Unspecified disorder of synovium and tendon, right shoulder: Secondary | ICD-10-CM | POA: Diagnosis not present

## 2021-07-18 DIAGNOSIS — F331 Major depressive disorder, recurrent, moderate: Secondary | ICD-10-CM

## 2021-07-18 DIAGNOSIS — M7551 Bursitis of right shoulder: Secondary | ICD-10-CM | POA: Diagnosis not present

## 2021-07-18 DIAGNOSIS — G8929 Other chronic pain: Secondary | ICD-10-CM | POA: Diagnosis not present

## 2021-07-18 DIAGNOSIS — F411 Generalized anxiety disorder: Secondary | ICD-10-CM | POA: Diagnosis not present

## 2021-07-18 DIAGNOSIS — M75111 Incomplete rotator cuff tear or rupture of right shoulder, not specified as traumatic: Secondary | ICD-10-CM | POA: Diagnosis not present

## 2021-07-18 DIAGNOSIS — M7541 Impingement syndrome of right shoulder: Secondary | ICD-10-CM | POA: Diagnosis not present

## 2021-07-18 DIAGNOSIS — M25511 Pain in right shoulder: Secondary | ICD-10-CM | POA: Diagnosis not present

## 2021-07-18 LAB — SYNOVIAL CELL COUNT + DIFF, W/ CRYSTALS
Crystals, Fluid: NONE SEEN
Eosinophils-Synovial: 0 %
Lymphocytes-Synovial Fld: 53 %
Monocyte-Macrophage-Synovial Fluid: 33 %
Neutrophil, Synovial: 14 %
WBC, Synovial: 163 /mm3 (ref 0–200)

## 2021-07-18 MED ORDER — BUSPIRONE HCL 10 MG PO TABS: 10.0000 mg | ORAL_TABLET | Freq: Two times a day (BID) | ORAL | 1 refills | Status: DC

## 2021-07-18 MED ORDER — LORAZEPAM 0.5 MG PO TABS: ORAL_TABLET | ORAL | 0 refills | Status: DC

## 2021-07-18 NOTE — Assessment & Plan Note (Signed)
Chronic, exacerbated. Buspar dosages have been changed recently along with an increase in stress at home and work. She would like a referral to a different psychiatrist, new referral placed. Will increase buspar to 10mg  BID. Consider adding abilify in the future if ongoing symptoms. Currently denies SI/HI. Will give 0.5mg  ativan for injection this afternoon. PDMP reviewed, discussed not taking pain medication at the same time. Follow up in 4 weeks or sooner with concerns.

## 2021-07-18 NOTE — Progress Notes (Signed)
Established Patient Office Visit  Subjective:  Patient ID: Jamie [name], female    DOB: 03-23-1971  Age: 50 y.o. MRN: [id number]  CC:  Chief Complaint  Patient presents with   Anxiety    HPI Jamie [name] presents for acute worsening of anxiety and stress. Jamie [name] states that her psychiatrist, recently decreased her buspar to 5m daily, however Jamie [name] misunderstood and was taking it twice a day and ran out of medication early. Then when Jamie [name] asked for a refill, it was too early to refill and Jamie [name] missed 1 week of medication. Jamie [name] also stated that Jamie [name] has had several other issues with Dr. EShea Evansand would like to see a different psychiatrist. Jamie [name] is planning on getting a steroid injection this afternoon for her rotator cuff and has a severe history of anxiety with needles and is asking for 1 time dose of ativan.   ANXIETY/STRESS  Duration:exacerbated Anxious mood: yes  Excessive worrying: yes Irritability: yes  [facility] 07/18/2021 02/10/2021  Decreased Interest 3 3  Down, Depressed, Hopeless 3 3  PHQ - 2 Score 6 6  Altered sleeping 3 3  Tired, decreased energy 3 3  Change in appetite 3 3  Feeling bad or failure about yourself  2 1  Trouble concentrating 3 3  Moving slowly or fidgety/restless 1 1  Suicidal thoughts 0 0  PHQ-9 Score 21 20  Difficult doing work/chores Very difficult Not difficult at all  Some encounter information is confidential and restricted. Go to Review Flowsheets activity to see all data.  Some recent data might be hidden   GAD 7 : Generalized Anxiety Score 07/18/2021 02/10/2021 04/04/2020 01/26/2020  Nervous, Anxious, on Edge 3 3 3 3   Control/stop worrying 3 3 2 3   Worry too much - different things 3 3 3 3   Trouble relaxing 3 3 3 3   Restless 3 3 3 3   Easily annoyed or irritable 3 2 2 3   Afraid - awful might happen 3 2 2 2   Total GAD 7 Score 21 19 18 20   Anxiety Difficulty Very difficult Somewhat difficult Somewhat difficult Somewhat difficult   Some encounter information is confidential and restricted. Go to Review Flowsheets activity to see all data.   Suicidal ideations: no  Crying spells: yes Recent Stressors/Life Changes: yes   Relationship problems: no   Family stress: yes     Financial stress: no    Job stress: yes    Recent death/loss: no   Past Medical History:  Diagnosis Date   Anxiety    takes Ativan daily.  Panic Attack   Arthritis    Chronic back pain    herniated disc/stenosis/scoliosis   Depression    takes Effexor daily   Eczema    uses a cream daily as needed   Family history of adverse reaction to anesthesia    pta dad is very hard to wake up;excessive nausea   History of bronchitis    > [id number]   Hx of dysplastic nevus 05/10/2020   neck - posterior, Severe Atypia. Shave removal 07/18/2020, margins free   Hypoglycemia    Insomnia    takes Lorazepam nightly   Joint pain    PONV (postoperative nausea and vomiting)    Seasonal allergies    uses Flonase daily   Torn rotator cuff    Vitamin D deficiency    takes Vit D daily    Past Surgical History:  Procedure Laterality Date   BACK SURGERY  2008/2012  laminectomy 1st time and 2nd time laminectomy and fusion   COLONOSCOPY WITH PROPOFOL N/A 05/27/2020   Procedure: COLONOSCOPY WITH PROPOFOL;  Surgeon: Virgel Manifold, MD;  Location: [facility];  Service: Gastroenterology;  Laterality: N/A;  COVID POSITIVE AUGUST 16   MAXIMUM ACCESS (MAS)POSTERIOR LUMBAR INTERBODY FUSION (PLIF) 2 LEVEL N/A 09/01/2014   Procedure: Lumbar four-five, Lumbar five-Sacral one Maximum Access Surgery posterior lumbar interbody fusion with interbody prosthesis posterior lateral arthrodesis posterior segmental instrumentation;  Surgeon: Elaina Hoops, MD;  Location: [facility];  Service: Neurosurgery;  Laterality: N/A;  Lumbar four-five, Lumbar five-Sacral one Maximum Access Surgery posterior lumbar interbody fusion with i    Family History  Problem Relation Age  of Onset   Arthritis Mother    Hyperlipidemia Mother    Migraines Mother    Arthritis Father    Diabetes Father    Heart disease Father    Atrial fibrillation Father    Diabetes Brother    Breast cancer Maternal Grandmother    Mental illness Neg Hx     Social History   Socioeconomic History   Marital status: Divorced    Spouse name: Not on file   Number of children: Not on file   Years of education: Not on file   Highest education level: Bachelor's degree (e.g., BA, AB, BS)  Occupational History   Not on file  Tobacco Use   Smoking status: Former    Packs/day: 0.50    Years: 20.00    Pack years: 10.00    Types: Cigarettes    Quit date: 10/18/2016    Years since quitting: 4.7   Smokeless tobacco: Never  Vaping Use   Vaping Use: Never used  Substance and Sexual Activity   Alcohol use: No   Drug use: No   Sexual activity: Not Currently  Other Topics Concern   Not on file  Social History Narrative   Not on file   Social Determinants of Health   Financial Resource Strain: Not on file  Food Insecurity: Not on file  Transportation Needs: Not on file  Physical Activity: Not on file  Stress: Not on file  Social Connections: Not on file  Intimate Partner Violence: Not on file    Outpatient Medications Prior to Visit  Medication Sig Dispense Refill   Ascorbic Acid (VITAMIN C PO) Take by mouth.     cholecalciferol (VITAMIN D) 1000 UNITS tablet Take 1,000 Units by mouth daily.     clindamycin (CLEOCIN T) 1 % lotion Apply to affected areas 1-2 times a day until rash improved. 60 mL 1   clobetasol cream (TEMOVATE) 0.05 % Apply to affected areas 1-2 times a day until rash improved. Avoid face, groin, axilla. 60 g 1   COVID-19 mRNA vaccine, Pfizer, 30 MCG/0.3ML injection USE AS DIRECTED .3 mL 0   Dupilumab (DUPIXENT) 300 MG/2ML SOPN Inject 300 mg into the skin every 14 (fourteen) days. Starting at day 15 for maintenance. 4 mL 5   fluticasone (FLONASE) 50 MCG/ACT nasal spray  Place 1 spray into both nostrils daily as needed for allergies.      gabapentin (NEURONTIN) 600 MG tablet TAKE 1 TABLET BY MOUTH THREE TIMES DAILY 90 tablet 1   Halobetasol Prop-Tazarotene (DUOBRII) 0.01-0.045 % LOTN Apply 1 application topically at bedtime. qhs to aa rash on hands and feet until clear, then prn flares, avoid face, groin, axilla 100 g 1   hydrOXYzine (ATARAX/VISTARIL) 10 MG tablet Take 1-3 tablets by mouth every night  as needed for itch. 90 tablet 1   meloxicam (MOBIC) 7.5 MG tablet TAKE 1 TABLET BY MOUTH TWICE DAILY 60 tablet 2   metaxalone (SKELAXIN) 800 MG tablet TAKE 1 TABLET BY MOUTH 4 TIMES DAILY AS NEEDED 120 tablet 2   metroNIDAZOLE (FLAGYL) 500 MG tablet Take 1 tablet (500 mg total) by mouth 3 (three) times daily. 30 tablet 0   mirtazapine (REMERON) 15 MG tablet Take 1 tablet (15 mg total) by mouth at bedtime. 30 tablet 1   montelukast (SINGULAIR) 10 MG tablet TAKE 1 TABLET BY MOUTH AT BEDTIME 90 tablet 3   morphine (KADIAN) 30 MG 24 hr capsule Take 30 mg by mouth daily.      Multiple Vitamin (MULTIVITAMIN ADULT PO) Take by mouth daily.     Multiple Vitamins-Minerals (ZINC PO) Take by mouth.     oxyCODONE (ROXICODONE) 15 MG immediate release tablet Take 15 mg by mouth every 6 (six) hours as needed.   0   PARoxetine (PAXIL) 20 MG tablet TAKE 1 & 1/2 TABLETS (30 MG) BY MOUTH DAILY. 45 tablet 1   propranolol (INDERAL) 20 MG tablet TAKE 1 TABLET (20 MG TOTAL) BY MOUTH 2 (TWO) TIMES DAILY AS NEEDED FOR SEVERE PANIC ATTACKS 60 tablet 1   tacrolimus (PROTOPIC) 0.1 % ointment Apply topically 2 (two) times daily. Apply to feet and hands 100 g 1   triamcinolone cream (KENALOG) 0.1 % Apply 1 application topically 2 (two) times daily. 60 g 6   busPIRone (BUSPAR) 10 MG tablet Take 1 tablet (10 mg total) by mouth daily with breakfast. 30 tablet 1   LORazepam (ATIVAN) 0.5 MG tablet Take 1 tab 1/2 hour before MRI and 1 immediately before. DO NOT DRIVE ON THIS MEDICINE 2 tablet 0    Secukinumab, 300 MG Dose, (COSENTYX, 300 MG DOSE,) 150 MG/ML SOSY Inject 300 mg into the skin every 28 days for maintenance 2 mL 5   No facility-administered medications prior to visit.    Allergies  Allergen Reactions   Shellfish Allergy Hives   Flexeril [Cyclobenzaprine] Hives    RAPID HEARTBEAT    ROS Review of Systems  Constitutional:  Positive for fatigue.  Respiratory: Negative.    Cardiovascular: Negative.   Musculoskeletal:  Positive for arthralgias (right shoulder).  Psychiatric/Behavioral:  The patient is nervous/anxious.      Objective:    Physical Exam Vitals and nursing note reviewed.  Constitutional:      General: Jamie [name] is not in acute distress.    Appearance: Normal appearance.  HENT:     Head: Normocephalic and atraumatic.  Eyes:     Conjunctiva/sclera: Conjunctivae normal.  Cardiovascular:     Rate and Rhythm: Normal rate and regular rhythm.     Pulses: Normal pulses.     Heart sounds: Normal heart sounds.  Pulmonary:     Effort: Pulmonary effort is normal.     Breath sounds: Normal breath sounds.  Musculoskeletal:     Cervical back: Normal range of motion.  Skin:    General: Skin is warm and dry.  Neurological:     General: No focal deficit present.     Mental Status: Jamie [name] is alert and oriented to person, place, and time.  Psychiatric:        Mood and Affect: Mood is anxious. Affect is tearful.        Behavior: Behavior normal.        Thought Content: Thought content normal.  Judgment: Judgment normal.    BP 115/71   Pulse 69   Temp 98.7 F (37.1 C) (Oral)   Wt 165 lb 3.2 oz (74.9 kg)   LMP 06/20/2016 (Approximate)   SpO2 98%   BMI 26.66 kg/m  Wt Readings from Last 3 Encounters:  07/18/21 165 lb 3.2 oz (74.9 kg)  07/11/21 163 lb (73.9 kg)  04/20/21 163 lb (73.9 kg)     Health Maintenance Due  Topic Date Due   Pneumococcal Vaccine 6-21 Years old (1 - PCV) Never done   Zoster Vaccines- Shingrix (1 of 2) Never done   COVID-19  Vaccine (4 - Booster for Pfizer series) 09/23/2020    There are no preventive care reminders to display for this patient.  Lab Results  Component Value Date   TSH 0.616 02/10/2021   Lab Results  Component Value Date   WBC 7.8 02/10/2021   HGB 12.6 02/10/2021   HCT 37.3 02/10/2021   MCV 97 02/10/2021   PLT 273 02/10/2021   Lab Results  Component Value Date   NA 142 02/10/2021   K 4.2 02/10/2021   CO2 23 02/10/2021   GLUCOSE 102 (H) 02/10/2021   BUN 10 02/10/2021   CREATININE 0.68 02/10/2021   BILITOT <0.2 02/10/2021   ALKPHOS 74 02/10/2021   AST 17 02/10/2021   ALT 11 02/10/2021   PROT 6.6 02/10/2021   ALBUMIN 4.5 02/10/2021   CALCIUM 9.4 02/10/2021   ANIONGAP 8 08/08/2015   EGFR 105 02/10/2021   Lab Results  Component Value Date   CHOL 197 02/10/2021   Lab Results  Component Value Date   HDL 46 02/10/2021   Lab Results  Component Value Date   LDLCALC 120 (H) 02/10/2021   Lab Results  Component Value Date   TRIG 173 (H) 02/10/2021   Lab Results  Component Value Date   CHOLHDL 3.6 10/16/2016   No results found for: HGBA1C    Assessment & Plan:   Problem List Items Addressed This Visit       Other   MDD (major depressive disorder), recurrent episode, moderate ([place]) - Primary    Chronic, exacerbated. Buspar dosages have been changed recently along with an increase in stress at home and work. Jamie [name] would like a referral to a different psychiatrist, new referral placed. Will increase buspar to 63m BID. Consider adding abilify in the future if ongoing symptoms. Currently denies SI/HI. Will give 0.564mativan for injection this afternoon. PDMP reviewed, discussed not taking pain medication at the same time. Follow up in 4 weeks or sooner with concerns.       Relevant Medications   busPIRone (BUSPAR) 10 MG tablet   LORazepam (ATIVAN) 0.5 MG tablet   Other Relevant Orders   Ambulatory referral to Psychiatry   GAD (generalized anxiety disorder)    Chronic,  exacerbated. Buspar dosages have been changed recently along with an increase in stress at home and work. Jamie [name] would like a referral to a different psychiatrist, new referral placed. Will increase buspar to 1067mID. Consider adding abilify in the future if ongoing symptoms. Currently denies SI/HI. Will give 0.5mg31mivan for injection this afternoon. PDMP reviewed, discussed not taking pain medication at the same time. Follow up in 4 weeks or sooner with concerns.       Relevant Medications   busPIRone (BUSPAR) 10 MG tablet   LORazepam (ATIVAN) 0.5 MG tablet   Other Relevant Orders   Ambulatory referral to Psychiatry    Meds  ordered this encounter  Medications   busPIRone (BUSPAR) 10 MG tablet    Sig: Take 1 tablet (10 mg total) by mouth 2 (two) times daily.    Dispense:  60 tablet    Refill:  1   LORazepam (ATIVAN) 0.5 MG tablet    Sig: Take 1 tablet 1/2 hour before your injection today. DO NOT DRIVE ON THIS MEDICINE    Dispense:  1 tablet    Refill:  0     Follow-up: Return in about 4 weeks (around 08/15/2021) for anxiety.    Charyl Dancer, NP

## 2021-07-21 NOTE — Telephone Encounter (Signed)
error 

## 2021-07-25 DIAGNOSIS — M75111 Incomplete rotator cuff tear or rupture of right shoulder, not specified as traumatic: Secondary | ICD-10-CM | POA: Diagnosis not present

## 2021-07-26 DIAGNOSIS — F1120 Opioid dependence, uncomplicated: Secondary | ICD-10-CM | POA: Diagnosis not present

## 2021-07-26 DIAGNOSIS — M961 Postlaminectomy syndrome, not elsewhere classified: Secondary | ICD-10-CM | POA: Diagnosis not present

## 2021-07-26 DIAGNOSIS — Z6826 Body mass index (BMI) 26.0-26.9, adult: Secondary | ICD-10-CM | POA: Diagnosis not present

## 2021-07-26 DIAGNOSIS — M5416 Radiculopathy, lumbar region: Secondary | ICD-10-CM | POA: Diagnosis not present

## 2021-07-27 ENCOUNTER — Other Ambulatory Visit: Payer: Self-pay | Admitting: Orthopedic Surgery

## 2021-07-31 ENCOUNTER — Other Ambulatory Visit (HOSPITAL_COMMUNITY): Payer: Self-pay

## 2021-08-03 ENCOUNTER — Encounter: Payer: Self-pay | Admitting: Orthopedic Surgery

## 2021-08-03 ENCOUNTER — Telehealth: Payer: Self-pay

## 2021-08-03 NOTE — Telephone Encounter (Signed)
Dr. Chauncey Cruel pt: Patient is currently on Jamie [name]. She is set to have surgery next Friday (12/23) for a torn rotator cuff. Anesthesiologist is not concerned with her taking Dupixent this week (she is due). She wanted to confirm with dermatologist that this was okay?  Can someone please advise?

## 2021-08-03 NOTE — Telephone Encounter (Signed)
Patient advised of information per Dr. Moye. 

## 2021-08-03 NOTE — Telephone Encounter (Signed)
No concerns with taking her dupixent around the surgery. Please wish her well with her surgery!

## 2021-08-07 ENCOUNTER — Other Ambulatory Visit (HOSPITAL_COMMUNITY): Payer: Self-pay

## 2021-08-10 ENCOUNTER — Other Ambulatory Visit (HOSPITAL_COMMUNITY): Payer: Self-pay

## 2021-08-14 ENCOUNTER — Encounter: Payer: Self-pay | Admitting: Emergency Medicine

## 2021-08-14 ENCOUNTER — Ambulatory Visit
Admission: EM | Admit: 2021-08-14 | Discharge: 2021-08-14 | Disposition: A | Discharge status: Home / Self Care | Payer: 59 | Attending: Emergency Medicine | Admitting: Emergency Medicine

## 2021-08-14 ENCOUNTER — Other Ambulatory Visit: Payer: Self-pay

## 2021-08-14 DIAGNOSIS — J209 Acute bronchitis, unspecified: Secondary | ICD-10-CM

## 2021-08-14 MED ORDER — AZITHROMYCIN 250 MG PO TABS: 250.0000 mg | ORAL_TABLET | Freq: Every day | ORAL | 0 refills | Status: DC

## 2021-08-14 MED ORDER — ALBUTEROL SULFATE HFA 108 (90 BASE) MCG/ACT IN AERS: 1.0000 | INHALATION_SPRAY | Freq: Four times a day (QID) | RESPIRATORY_TRACT | 0 refills | Status: DC | PRN

## 2021-08-14 NOTE — ED Triage Notes (Signed)
Pt c/o cough, chest congestion, sinus pressure x 3 days.

## 2021-08-14 NOTE — ED Provider Notes (Signed)
Roderic Palau    CSN: [id number] Arrival date & time: 08/14/21  0906      History   Chief Complaint Chief Complaint  Patient presents with   Cough    HPI Jamie [name] is a 50 y.o. female.  Patient presents with 3-day history of chest congestion, productive cough, sinus pressure.  No fever, chills, rash, shortness of breath, or other symptoms.  OTC treatment attempted at home.  Patient is scheduled for rotator cuff surgery in 4 days.  Her medical history includes chronic back pain, osteoarthritis, depression, anxiety, panic attack.    The history is provided by the patient and medical records.   Past Medical History:  Diagnosis Date   Anxiety    takes Ativan daily.  Panic Attack   Arthritis    Chronic back pain    herniated disc/stenosis/scoliosis   Depression    takes Effexor daily   Eczema    uses a cream daily as needed   Family history of adverse reaction to anesthesia    pta dad is very hard to wake up;excessive nausea   History of bronchitis    > 8yr ago   Hx of dysplastic nevus 05/10/2020   neck - posterior, Severe Atypia. Shave removal 07/18/2020, margins free   Hypoglycemia    Insomnia    takes Lorazepam nightly   Joint pain    PONV (postoperative nausea and vomiting)    Seasonal allergies    uses Flonase daily   Torn rotator cuff    Vitamin D deficiency    takes Vit D daily   Wears contact lenses     Patient Active Problem List   Diagnosis Date Noted   Left wrist pain 10/25/2020   MDD (major depressive disorder), recurrent, in full remission ([place]) 10/19/2020   MDD (major depressive disorder), recurrent episode, moderate ([place]) 04/26/2020   GAD (generalized anxiety disorder) 04/26/2020   Panic attack 04/26/2020   At risk for long QT syndrome 04/26/2020   COVID-19 04/14/2020   DDD (degenerative disc disease), lumbar 05/12/2019   Allergic rhinitis 05/11/2019   Primary osteoarthritis of right knee 03/20/2018   Insomnia 08/24/2017    Chronic back pain 10/16/2016   Pseudoarthrosis of lumbar spine 08/11/2015   Eczema 05/09/2015   Anxiety 05/09/2015   Depression, recurrent ([place]) 03/08/2015   Chronic low back pain 01/05/2015   Spinal stenosis of lumbar region 09/01/2014   Spondylolisthesis 08/03/2014   Displacement of lumbar intervertebral disc without myelopathy 08/03/2014    Past Surgical History:  Procedure Laterality Date   BACK SURGERY  2008/2012   laminectomy 1st time and 2nd time laminectomy and fusion   COLONOSCOPY WITH PROPOFOL N/A 05/27/2020   Procedure: COLONOSCOPY WITH PROPOFOL;  Surgeon: Virgel Manifold, MD;  Location: [facility] ENDOSCOPY;  Service: Gastroenterology;  Laterality: N/A;  COVID POSITIVE AUGUST 16   MAXIMUM ACCESS (MAS)POSTERIOR LUMBAR INTERBODY FUSION (PLIF) 2 LEVEL N/A 09/01/2014   Procedure: Lumbar four-five, Lumbar five-Sacral one Maximum Access Surgery posterior lumbar interbody fusion with interbody prosthesis posterior lateral arthrodesis posterior segmental instrumentation;  Surgeon: Elaina Hoops, MD;  Location: [place] [facility];  Service: Neurosurgery;  Laterality: N/A;  Lumbar four-five, Lumbar five-Sacral one Maximum Access Surgery posterior lumbar interbody fusion with i    OB History   No obstetric history on file.      Home Medications    Prior to Admission medications   Medication Sig Start Date End Date Taking? Authorizing Provider  albuterol (VENTOLIN HFA) 108 (90 Base)  MCG/ACT inhaler Inhale 1-2 puffs into the lungs every 6 (six) hours as needed for wheezing or shortness of breath. 08/14/21  Yes Sharion Balloon, NP  azithromycin (ZITHROMAX) 250 MG tablet Take 1 tablet (250 mg total) by mouth daily. Take first 2 tablets together, then 1 every day until finished. 08/14/21  Yes Sharion Balloon, NP  Ascorbic Acid (VITAMIN C PO) Take by mouth.    [provider]  busPIRone (BUSPAR) 10 MG tablet Take 1 tablet (10 mg total) by mouth 2 (two) times daily. 07/18/21   McElwee, Lauren  A, NP  cholecalciferol (VITAMIN D) 1000 UNITS tablet Take 1,000 Units by mouth daily.    [provider]  clindamycin (CLEOCIN T) 1 % lotion Apply to affected areas 1-2 times a day until rash improved. Patient not taking: Reported on 08/03/2021 03/15/21   [name], MD  clobetasol cream (TEMOVATE) 0.05 % Apply to affected areas 1-2 times a day until rash improved. Avoid face, groin, axilla. Patient not taking: Reported on 08/03/2021 03/15/21   [name], MD  Dupilumab (DUPIXENT) 300 MG/2ML SOPN Inject 300 mg into the skin every 14 (fourteen) days. Starting at day 15 for maintenance. 05/25/21   Tresa Garter, MD  fluticasone (FLONASE) 50 MCG/ACT nasal spray Place 1 spray into both nostrils daily as needed for allergies.     [provider]  gabapentin (NEURONTIN) 600 MG tablet TAKE 1 TABLET BY MOUTH THREE TIMES DAILY 01/02/21 01/02/22    Halobetasol Prop-Tazarotene (DUOBRII) 0.01-0.045 % LOTN Apply 1 application topically at bedtime. qhs to aa rash on hands and feet until clear, then prn flares, avoid face, groin, axilla Patient not taking: Reported on 08/03/2021 03/08/21   [name], MD  hydrOXYzine (ATARAX/VISTARIL) 10 MG tablet Take 1-3 tablets by mouth every night as needed for itch. Patient not taking: Reported on 08/03/2021 03/15/21   [name], MD  LORazepam (ATIVAN) 0.5 MG tablet Take 1 tablet 1/2 hour before your injection today. DO NOT DRIVE ON THIS MEDICINE Patient not taking: Reported on 08/03/2021 07/18/21   Charyl Dancer, NP  meloxicam (MOBIC) 7.5 MG tablet TAKE 1 TABLET BY MOUTH TWICE DAILY 10/26/20 10/26/21  Covington Thane Edu, PA-C  metaxalone (SKELAXIN) 800 MG tablet TAKE 1 TABLET BY MOUTH 4 TIMES DAILY AS NEEDED 10/26/20 10/26/21  [place]ovington Thane Edu, PA-C  metroNIDAZOLE (FLAGYL) 500 MG tablet Take 1 tablet (500 mg total) by mouth 3 (three) times daily. Patient not taking: Reported on 08/03/2021 04/20/21   Charyl Dancer, NP   mirtazapine (REMERON) 15 MG tablet Take 1 tablet (15 mg total) by mouth at bedtime. Patient not taking: Reported on 08/03/2021 05/31/21   Ursula Alert, MD  montelukast (SINGULAIR) 10 MG tablet TAKE 1 TABLET BY MOUTH AT BEDTIME 04/12/21 04/12/22  Johnson, Megan P, DO  morphine (KADIAN) 30 MG 24 hr capsule Take 30 mg by mouth daily.     [provider]  Multiple Vitamin (MULTIVITAMIN ADULT PO) Take by mouth daily.    [provider]  Multiple Vitamins-Minerals (ZINC PO) Take by mouth.    [provider]  oxyCODONE (ROXICODONE) 15 MG immediate release tablet Take 15 mg by mouth every 6 (six) hours as needed.  03/09/16   [provider]  PARoxetine (PAXIL) 20 MG tablet TAKE 1 & 1/2 TABLETS (30 MG) BY MOUTH DAILY. 05/16/21   Ursula Alert, MD  propranolol (INDERAL) 20 MG tablet TAKE 1 TABLET (20 MG TOTAL) BY MOUTH 2 (TWO)  TIMES DAILY AS NEEDED FOR SEVERE PANIC ATTACKS 11/16/20 11/16/21  Ursula Alert, MD  tacrolimus (PROTOPIC) 0.1 % ointment Apply topically 2 (two) times daily. Apply to feet and hands Patient not taking: Reported on 08/03/2021 05/16/21   [name], MD  triamcinolone cream (KENALOG) 0.1 % Apply 1 application topically 2 (two) times daily. Patient not taking: Reported on 08/03/2021 11/04/19   Volney American, PA-C    Family History Family History  Problem Relation Age of Onset   Arthritis Mother    Hyperlipidemia Mother    Migraines Mother    Arthritis Father    Diabetes Father    Heart disease Father    Atrial fibrillation Father    Diabetes Brother    Breast cancer Maternal Grandmother    Mental illness Neg Hx     Social History Social History   Tobacco Use   Smoking status: Former    Packs/day: 0.50    Years: 20.00    Pack years: 10.00    Types: Cigarettes    Quit date: 10/18/2016    Years since quitting: 4.8   Smokeless tobacco: Never  Vaping Use   Vaping Use: Never used  Substance Use Topics   Alcohol use: No    Drug use: No     Allergies   Shellfish allergy and Flexeril [cyclobenzaprine]   Review of Systems Review of Systems  Constitutional:  Negative for chills and fever.  HENT:  Positive for congestion, postnasal drip, rhinorrhea and sinus pressure. Negative for ear pain and sore throat.   Respiratory:  Positive for cough. Negative for shortness of breath.   Cardiovascular:  Negative for chest pain and palpitations.  Gastrointestinal:  Negative for diarrhea and vomiting.  Skin:  Negative for color change and rash.  All other systems reviewed and are negative.   Physical Exam Triage Vital Signs ED Triage Vitals  Enc Vitals Group     BP 08/14/21 1032 131/77     Pulse Rate 08/14/21 1032 76     Resp 08/14/21 1032 18     Temp 08/14/21 1032 98.9 F (37.2 C)     Temp Source 08/14/21 1032 Oral     SpO2 08/14/21 1032 98 %     Weight --      Height --      Head Circumference --      Peak Flow --      Pain Score 08/14/21 1030 0     Pain Loc --      Pain Edu? --      Excl. in [place]? --    No data found.  Updated Vital Signs BP 131/77 (BP Location: Left Arm)    Pulse 76    Temp 98.9 F (37.2 C) (Oral)    Resp 18    LMP 06/20/2016 (Approximate)    SpO2 98%   Visual Acuity Right Eye Distance:   Left Eye Distance:   Bilateral Distance:    Right Eye Near:   Left Eye Near:    Bilateral Near:     Physical Exam Vitals and nursing note reviewed.  Constitutional:      General: She is not in acute distress.    Appearance: Normal appearance. She is well-developed.  HENT:     Right Ear: Tympanic membrane normal.     Left Ear: Tympanic membrane normal.     Nose: Nose normal.     Mouth/Throat:     Mouth: Mucous membranes are moist.  Pharynx: Oropharynx is clear.  Cardiovascular:     Rate and Rhythm: Normal rate and regular rhythm.     Heart sounds: No murmur heard. Pulmonary:     Effort: Pulmonary effort is normal. No respiratory distress.     Breath sounds: Rhonchi present.      Comments: Scattered rhonchi on right which clears with cough. Musculoskeletal:     Cervical back: Neck supple.  Skin:    General: Skin is warm and dry.  Neurological:     Mental Status: She is alert.  Psychiatric:        Mood and Affect: Mood normal.        Behavior: Behavior normal.     UC Treatments / Results  Labs (all labs ordered are listed, but only abnormal results are displayed) Labs Reviewed - No data to display  EKG   Radiology No results found.  Procedures Procedures (including critical care time)  Medications Ordered in UC Medications - No data to display  Initial Impression / Assessment and Plan / UC Course  I have reviewed the triage vital signs and the nursing notes.  Pertinent labs & imaging results that were available during my care of the patient were reviewed by me and considered in my medical decision making (see chart for details).   Acute bronchitis.  Patient has scattered rhonchi in the right upper lung which clears with cough.  Treating with Zithromax and albuterol inhaler.  Patient is scheduled for rotator cuff surgery this week; instructed to call her surgeon's office to inform them about her bronchitis and treatment.  Instructed her to follow-up with her PCP if her symptoms are not improving.  She agrees to plan of care.   Final Clinical Impressions(s) / UC Diagnoses   Final diagnoses:  Acute bronchitis, unspecified organism     Discharge Instructions      Take the Zithromax and use the albuterol inhaler as directed.  Follow up with your primary care provider if your symptoms are not improving.        ED Prescriptions     Medication Sig Dispense Auth. Provider   albuterol (VENTOLIN HFA) 108 (90 Base) MCG/ACT inhaler Inhale 1-2 puffs into the lungs every 6 (six) hours as needed for wheezing or shortness of breath. 18 g Sharion Balloon, NP   azithromycin (ZITHROMAX) 250 MG tablet Take 1 tablet (250 mg total) by mouth daily. Take first  2 tablets together, then 1 every day until finished. 6 tablet Sharion Balloon, NP      PDMP not reviewed this encounter.   Sharion Balloon, NP 08/14/21 1123

## 2021-08-14 NOTE — Discharge Instructions (Addendum)
Take the Zithromax and use the albuterol inhaler as directed.  Follow up with your primary care provider if your symptoms are not improving.

## 2021-08-15 ENCOUNTER — Other Ambulatory Visit: Payer: Self-pay | Admitting: Family Medicine

## 2021-08-15 DIAGNOSIS — F411 Generalized anxiety disorder: Secondary | ICD-10-CM

## 2021-08-15 DIAGNOSIS — F410 Panic disorder [episodic paroxysmal anxiety] without agoraphobia: Secondary | ICD-10-CM

## 2021-08-15 MED ORDER — PAROXETINE HCL 20 MG PO TABS: ORAL_TABLET | ORAL | 0 refills | Status: DC

## 2021-08-15 NOTE — Telephone Encounter (Signed)
Routing to provider for refill. See message from the patient.

## 2021-08-15 NOTE — Telephone Encounter (Signed)
Copied from [place] [phone]. Topic: General - Other >> Aug 15, 2021 10:16 AM Tessa Lerner A wrote: Reason for CRM: The patient would like to be contacted by a member of staff when possible about a refill of their Rx #: [id number]  PARoxetine (PAXIL) 20 MG tablet [[id number]]    The patient is aware that they need to be seen in office for a refill to be filled but the patient's surgery has been moved up and they are unable to come in prior to 08/28/21  Please contact further to discuss the medication

## 2021-08-18 ENCOUNTER — Ambulatory Visit
Admission: RE | Disposition: A | Discharge status: Home / Self Care | Payer: Self-pay | Source: Ambulatory Visit | Attending: Orthopedic Surgery

## 2021-08-18 ENCOUNTER — Ambulatory Visit: Payer: 59 | Admitting: Anesthesiology

## 2021-08-18 ENCOUNTER — Other Ambulatory Visit: Payer: Self-pay

## 2021-08-18 ENCOUNTER — Ambulatory Visit: Payer: 59 | Admitting: Family Medicine

## 2021-08-18 ENCOUNTER — Ambulatory Visit
Admission: RE | Admit: 2021-08-18 | Discharge: 2021-08-18 | Disposition: A | Discharge status: Home / Self Care | Payer: 59 | Source: Ambulatory Visit | Attending: Orthopedic Surgery | Admitting: Orthopedic Surgery

## 2021-08-18 ENCOUNTER — Encounter: Payer: Self-pay | Admitting: Orthopedic Surgery

## 2021-08-18 DIAGNOSIS — F419 Anxiety disorder, unspecified: Secondary | ICD-10-CM | POA: Insufficient documentation

## 2021-08-18 DIAGNOSIS — M67813 Other specified disorders of tendon, right shoulder: Secondary | ICD-10-CM | POA: Insufficient documentation

## 2021-08-18 DIAGNOSIS — M75121 Complete rotator cuff tear or rupture of right shoulder, not specified as traumatic: Secondary | ICD-10-CM | POA: Insufficient documentation

## 2021-08-18 DIAGNOSIS — S46011A Strain of muscle(s) and tendon(s) of the rotator cuff of right shoulder, initial encounter: Secondary | ICD-10-CM | POA: Diagnosis not present

## 2021-08-18 DIAGNOSIS — G8929 Other chronic pain: Secondary | ICD-10-CM | POA: Insufficient documentation

## 2021-08-18 DIAGNOSIS — M7521 Bicipital tendinitis, right shoulder: Secondary | ICD-10-CM | POA: Diagnosis not present

## 2021-08-18 DIAGNOSIS — M75111 Incomplete rotator cuff tear or rupture of right shoulder, not specified as traumatic: Secondary | ICD-10-CM | POA: Diagnosis not present

## 2021-08-18 DIAGNOSIS — Z79891 Long term (current) use of opiate analgesic: Secondary | ICD-10-CM | POA: Insufficient documentation

## 2021-08-18 DIAGNOSIS — Z87891 Personal history of nicotine dependence: Secondary | ICD-10-CM | POA: Diagnosis not present

## 2021-08-18 DIAGNOSIS — M545 Low back pain, unspecified: Secondary | ICD-10-CM | POA: Diagnosis not present

## 2021-08-18 DIAGNOSIS — G8918 Other acute postprocedural pain: Secondary | ICD-10-CM | POA: Diagnosis not present

## 2021-08-18 DIAGNOSIS — M7541 Impingement syndrome of right shoulder: Secondary | ICD-10-CM | POA: Diagnosis not present

## 2021-08-18 DIAGNOSIS — M25511 Pain in right shoulder: Secondary | ICD-10-CM | POA: Diagnosis not present

## 2021-08-18 DIAGNOSIS — M7581 Other shoulder lesions, right shoulder: Secondary | ICD-10-CM | POA: Diagnosis not present

## 2021-08-18 HISTORY — PX: SHOULDER ARTHROSCOPY WITH SUBACROMIAL DECOMPRESSION AND OPEN ROTATOR C: SHX5688

## 2021-08-18 HISTORY — DX: Presence of spectacles and contact lenses: Z97.3

## 2021-08-18 SURGERY — SHOULDER ARTHROSCOPY WITH SUBACROMIAL DECOMPRESSION AND OPEN ROTATOR CUFF REPAIR, OPEN BICEPS TENDON REPAIR
Anesthesia: Regional | Site: Shoulder | Laterality: Right

## 2021-08-18 MED ORDER — PHENYLEPHRINE HCL-NACL 20-0.9 MG/250ML-% IV SOLN
INTRAVENOUS | Status: DC | PRN
  Administered 2021-08-18: 25 ug/min via INTRAVENOUS

## 2021-08-18 MED ORDER — BUPIVACAINE HCL (PF) 0.25 % IJ SOLN
INTRAMUSCULAR | Status: DC | PRN
  Administered 2021-08-18: 25 mg via EPIDURAL

## 2021-08-18 MED ORDER — OXYCODONE HCL 5 MG PO TABS: 5.0000 mg | ORAL_TABLET | Freq: Once | ORAL | Status: DC | PRN

## 2021-08-18 MED ORDER — ACETAMINOPHEN 500 MG PO TABS: 1000.0000 mg | ORAL_TABLET | Freq: Three times a day (TID) | ORAL | 2 refills | Status: AC

## 2021-08-18 MED ORDER — BUPIVACAINE LIPOSOME 1.3 % IJ SUSP
INTRAMUSCULAR | Status: DC | PRN
  Administered 2021-08-18: 266 mg

## 2021-08-18 MED ORDER — PROPOFOL 10 MG/ML IV BOLUS
INTRAVENOUS | Status: DC | PRN
  Administered 2021-08-18 (×2): 50 mg via INTRAVENOUS
  Administered 2021-08-18: 150 mg via INTRAVENOUS

## 2021-08-18 MED ORDER — LACTATED RINGERS IV SOLN: INTRAVENOUS | Status: DC

## 2021-08-18 MED ORDER — OXYCODONE HCL 5 MG/5ML PO SOLN: 5.0000 mg | Freq: Once | ORAL | Status: DC | PRN

## 2021-08-18 MED ORDER — PROMETHAZINE HCL 25 MG/ML IJ SOLN: 6.2500 mg | INTRAMUSCULAR | Status: DC | PRN

## 2021-08-18 MED ORDER — DEXAMETHASONE SODIUM PHOSPHATE 4 MG/ML IJ SOLN
INTRAMUSCULAR | Status: DC | PRN
  Administered 2021-08-18: 4 mg via INTRAVENOUS

## 2021-08-18 MED ORDER — ONDANSETRON HCL 4 MG/2ML IJ SOLN
INTRAMUSCULAR | Status: DC | PRN
  Administered 2021-08-18: 4 mg via INTRAVENOUS

## 2021-08-18 MED ORDER — LIDOCAINE HCL (CARDIAC) PF 100 MG/5ML IV SOSY
PREFILLED_SYRINGE | INTRAVENOUS | Status: DC | PRN
  Administered 2021-08-18: 20 mg via INTRAVENOUS

## 2021-08-18 MED ORDER — SUCCINYLCHOLINE CHLORIDE 200 MG/10ML IV SOSY
PREFILLED_SYRINGE | INTRAVENOUS | Status: DC | PRN
Start: 2021-08-18 — End: 2021-08-18
  Administered 2021-08-18: 80 mg via INTRAVENOUS

## 2021-08-18 MED ORDER — LACTATED RINGERS IR SOLN
Status: DC | PRN
  Administered 2021-08-18: 3000 mL
  Administered 2021-08-18 (×2): 6000 mL
  Administered 2021-08-18: 3000 mL

## 2021-08-18 MED ORDER — EPHEDRINE SULFATE 50 MG/ML IJ SOLN
INTRAMUSCULAR | Status: DC | PRN
  Administered 2021-08-18: 5 mg via INTRAVENOUS
  Administered 2021-08-18: 10 mg via INTRAVENOUS

## 2021-08-18 MED ORDER — HYDROMORPHONE HCL 1 MG/ML IJ SOLN: 0.2500 mg | INTRAMUSCULAR | Status: DC | PRN

## 2021-08-18 MED ORDER — PHENYLEPHRINE HCL (PRESSORS) 10 MG/ML IV SOLN
INTRAVENOUS | Status: DC | PRN
  Administered 2021-08-18: 150 ug via INTRAVENOUS

## 2021-08-18 MED ORDER — CEFAZOLIN SODIUM-DEXTROSE 2-4 GM/100ML-% IV SOLN
2.0000 g | INTRAVENOUS | Status: AC
  Administered 2021-08-18: 08:00:00 2 g via INTRAVENOUS

## 2021-08-18 MED ORDER — MIDAZOLAM HCL 2 MG/2ML IJ SOLN
INTRAMUSCULAR | Status: DC | PRN
  Administered 2021-08-18: 2 mg via INTRAVENOUS

## 2021-08-18 MED ORDER — FENTANYL CITRATE (PF) 100 MCG/2ML IJ SOLN
INTRAMUSCULAR | Status: DC | PRN
Start: 2021-08-18 — End: 2021-08-18
  Administered 2021-08-18: 50 ug via INTRAVENOUS
  Administered 2021-08-18: 100 ug via INTRAVENOUS
  Administered 2021-08-18: 50 ug via INTRAVENOUS

## 2021-08-18 MED ORDER — BUPIVACAINE HCL (PF) 0.5 % IJ SOLN
INTRAMUSCULAR | Status: DC | PRN
  Administered 2021-08-18: 75 mg

## 2021-08-18 MED ORDER — LACTATED RINGERS IV SOLN
INTRAVENOUS | Status: DC | PRN
  Administered 2021-08-18: 09:00:00 12000 mL

## 2021-08-18 MED ORDER — OXYCODONE HCL 5 MG PO TABS
5.0000 mg | ORAL_TABLET | ORAL | 0 refills | Status: DC | PRN
Start: 2021-08-18 — End: 2021-11-15

## 2021-08-18 MED ORDER — ONDANSETRON 4 MG PO TBDP: 4.0000 mg | ORAL_TABLET | Freq: Three times a day (TID) | ORAL | 0 refills | Status: DC | PRN

## 2021-08-18 MED ORDER — ASPIRIN EC 325 MG PO TBEC: 325.0000 mg | DELAYED_RELEASE_TABLET | Freq: Every day | ORAL | 0 refills | Status: AC

## 2021-08-18 SURGICAL SUPPLY — 48 items
ADAPTER IRRIG TUBE 2 SPIKE SOL (ADAPTER) ×4 IMPLANT
ADPR TBG 2 SPK PMP STRL ASCP (ADAPTER) ×2
ANCH SUT 2 SWLK 19.1 CLS EYLT (Anchor) ×1 IMPLANT
ANCH SUT 2.9 PUSHLOCK ANCH (Orthopedic Implant) ×1 IMPLANT
ANCH SUT SWLK 19.1X5.5 CLS (Anchor) ×1 IMPLANT
ANCHOR ICONIX SPEED 2.3 (Anchor) ×1 IMPLANT
ANCHOR SWIVELOCK BIO 4.75X19.1 (Anchor) ×1 IMPLANT
ANCHOR SWIVELOCK BIO COMP (Anchor) ×1 IMPLANT
APL PRP STRL LF DISP 70% ISPRP (MISCELLANEOUS) ×1
BUR BR 5.5 WIDE MOUTH (BURR) ×2 IMPLANT
BUR RADIUS 4.0X18.5 (BURR) ×2 IMPLANT
CANNULA PART THRD DISP 5.75X7 (CANNULA) ×1 IMPLANT
CANNULA TWIST IN 8.25X7CM (CANNULA) ×1 IMPLANT
CHLORAPREP W/TINT 26 (MISCELLANEOUS) ×2 IMPLANT
COOLER POLAR GLACIER W/PUMP (MISCELLANEOUS) ×2 IMPLANT
COVER LIGHT HANDLE UNIVERSAL (MISCELLANEOUS) ×4 IMPLANT
DRAPE IMP U-DRAPE 54X76 (DRAPES) ×4 IMPLANT
DRAPE INCISE IOBAN 66X45 STRL (DRAPES) ×2 IMPLANT
DRAPE U-SHAPE 48X52 POLY STRL (PACKS) ×2 IMPLANT
DRSG TEGADERM 4X4.75 (GAUZE/BANDAGES/DRESSINGS) ×6 IMPLANT
ELECT REM PT RETURN 9FT ADLT (ELECTROSURGICAL) ×2
ELECTRODE REM PT RTRN 9FT ADLT (ELECTROSURGICAL) IMPLANT
GAUZE XEROFORM 1X8 LF (GAUZE/BANDAGES/DRESSINGS) ×2 IMPLANT
GLOVE SURG ENC MOIS LTX SZ7.5 (GLOVE) ×4 IMPLANT
GOWN STRL REIN 2XL XLG LVL4 (GOWN DISPOSABLE) ×2 IMPLANT
GOWN STRL REUS W/ TWL LRG LVL3 (GOWN DISPOSABLE) ×2 IMPLANT
GOWN STRL REUS W/TWL LRG LVL3 (GOWN DISPOSABLE) ×4
IV LACTATED RINGER IRRG 3000ML (IV SOLUTION) ×20
IV LR IRRIG 3000ML ARTHROMATIC (IV SOLUTION) ×6 IMPLANT
KIT STABILIZATION SHOULDER (MISCELLANEOUS) ×2 IMPLANT
KIT TURNOVER KIT A (KITS) ×2 IMPLANT
MANIFOLD NEPTUNE II (INSTRUMENTS) ×2 IMPLANT
MASK FACE SPIDER DISP (MASK) ×2 IMPLANT
MAT GRAY ABSORB FLUID 28X50 (MISCELLANEOUS) ×4 IMPLANT
PACK ARTHROSCOPY SHOULDER (MISCELLANEOUS) ×2 IMPLANT
PAD WRAPON POLAR SHDR XLG (MISCELLANEOUS) ×1 IMPLANT
PASSER SUT FIRSTPASS SELF (INSTRUMENTS) ×2 IMPLANT
SPONGE GAUZE 2X2 8PLY STRL LF (GAUZE/BANDAGES/DRESSINGS) ×8 IMPLANT
SUT ETHILON 3-0 FS-10 30 BLK (SUTURE) ×2
SUT PDSII 0 (SUTURE) ×1 IMPLANT
SUTURE EHLN 3-0 FS-10 30 BLK (SUTURE) ×1 IMPLANT
SYR 10ML LL (SYRINGE) ×2 IMPLANT
SYSTEM IMPL TENODESIS LNT 2.9 (Orthopedic Implant) ×1 IMPLANT
TAPE MICROFOAM 4IN (TAPE) ×2 IMPLANT
TUBING INFLOW SET DBFLO PUMP (TUBING) ×2 IMPLANT
TUBING OUTFLOW SET DBLFO PUMP (TUBING) ×2 IMPLANT
WAND WEREWOLF FLOW 90D (MISCELLANEOUS) ×2 IMPLANT
WRAPON POLAR PAD SHDR XLG (MISCELLANEOUS) ×2

## 2021-08-18 NOTE — Anesthesia Procedure Notes (Signed)
Anesthesia Regional Block: Interscalene brachial plexus block   Pre-Anesthetic Checklist: , timeout performed,  Correct Patient, Correct Site, Correct Laterality,  Correct Procedure, Correct Position, site marked,  Risks and benefits discussed,  Surgical consent,  Pre-op evaluation,  At surgeon's request and post-op pain management  Laterality: Right  Prep: chloraprep       Needles:  Injection technique: Single-shot  Needle Type: Stimiplex      Needle Gauge: 20     Additional Needles:   Procedures:,,,, ultrasound used (permanent image in chart),,    Narrative:  Start time: 08/18/2021 6:59 AM End time: 08/18/2021 7:09 AM Injection made incrementally with aspirations every 5 mL.  Performed by: Personally  Anesthesiologist: April Manson, MD  Additional Notes: 15ccs 0.5% bupiv + 20 exparel for ISB, 10ccs 0.25% bupiv used for intercostobrachial

## 2021-08-18 NOTE — Op Note (Signed)
SURGERY DATE: 08/18/2021   PRE-OP DIAGNOSIS:  1. Right subacromial impingement 2. Right biceps tendinopathy 3. Right rotator cuff tear   POST-OP DIAGNOSIS: 1. Right subacromial impingement 2. Right biceps tendinopathy 3. Right rotator cuff tear   PROCEDURES:  1. Right arthroscopic rotator cuff repair 2. Right arthroscopic biceps tenodesis 3. Right arthroscopic subacromial decompression 4. Right arthroscopic extensive debridement of shoulder (glenohumeral and subacromial spaces)   SURGEON: Cato Mulligan, MD   ASSISTANT: None   ANESTHESIA: Gen with Exparel interscalene block   ESTIMATED BLOOD LOSS: 5cc   DRAINS:  none   TOTAL IV FLUIDS: per anesthesia      SPECIMENS: none   IMPLANTS:  - Arthrex 2.32mm PushLock x 1 - Arthrex 4.33mm SwiveLock x 1 - Iconix SPEED double loaded with 1.2 and 2.72mm tape x 1     OPERATIVE FINDINGS:  Examination under anesthesia: A careful examination under anesthesia was performed.  Passive range of motion was: FF: 160; ER at side: 80; ER in abduction: 110; IR in abduction: 45.  Anterior load shift: NT.  Posterior load shift: NT.  Sulcus in neutral: NT.  Sulcus in ER: NT.     Intra-operative findings: A thorough arthroscopic examination of the shoulder was performed.  The findings are: 1. Biceps tendon: tendinopathy with significant erythema  2. Superior labrum: erythema 3. Posterior labrum and capsule: degenerative posterior labrum 4. Inferior capsule and inferior recess: normal 5. Glenoid cartilage surface: Rim of inferior glenoid adjacent to inferior labrum with grade 2-3 degenerative changes 6. Supraspinatus attachment: full-thickness tear of the anterior supraspinatus 7. Posterior rotator cuff attachment: normal 8. Humeral head articular cartilage: grade 1-2 degenerative changes of posterior humeral head 9. Rotator interval: significant synovitis 10: Subscapularis tendon: attachment intact 11. Anterior labrum: Mildly degenerative 12.  IGHL: normal   OPERATIVE REPORT:    Indications for procedure:  Jamie [name] is a 50 y.o. female with approximately 6 months of right shoulder pain.  She has had difficulty with overhead motion, significant pain at night, and inability to perform daily activities or work due to symptoms. Clinical exam and MRI were suggestive of rotator cuff tear, biceps tendinopathy, and subacromial impingement. After discussion of risks, benefits, and alternatives to surgery, the patient elected to proceed.    Procedure in detail:   I identified Jamie [name] in the pre-operative holding area.  I marked the operative shoulder with my initials. I reviewed the risks and benefits of the proposed surgical intervention, and the patient wished to proceed.  Anesthesia was then performed with an Exparel interscalene block.  The patient was transferred to the operative suite and placed in the beach chair position.     Appropriate IV antibiotics were administered prior to incision. The operative upper extremity was then prepped and draped in standard fashion. A time out was performed confirming the correct extremity, correct patient, and correct procedure.    I then created a standard posterior portal with an 11 blade. The glenohumeral joint was easily entered with a blunt trocar and the arthroscope introduced. The findings of diagnostic arthroscopy are described above. I debrided degenerative tissue including the synovitic tissue about the rotator interval and anterior and posterior labrum. I then coagulated the inflamed synovium to obtain hemostasis and reduce the risk of post-operative swelling using an Arthrocare radiofrequency device.  I then debrided the degenerative tissue about the inferior labrum and inferior glenoid with an oscillating shaver from the posterior portal.   I then turned my attention to the arthroscopic biceps  tenodesis. The Loop n Tack technique was used to pass a FiberTape through the biceps in a  locked fashion adjacent to the biceps anchor.  A hole for a 2.9 mm Arthrex PushLock was drilled in the bicipital groove just superior to the subscapularis tendon insertion.  The biceps tendon was then cut and the biceps anchor complex was debrided down to a stable base on the superior labrum.  The FiberTape was loaded onto the PushLock anchor and impacted into place into the previously drilled hole in the bicipital groove.  This appropriately secured the biceps into the bicipital groove and took it off of tension.   Next, the arthroscope was then introduced into the subacromial space. A direct lateral portal was created with an 11-blade after spinal needle localization. An extensive subacromial bursectomy and debridement was performed using a combination of the shaver and Arthrocare wand.  There was significant synovitic type tissue in the subacromial space that was debrided with an oscillating shaver and ArthroCare wand as well.  The entire acromial undersurface was exposed and the CA ligament was subperiosteally elevated to expose the anterior acromial hook. A burr was used to create a flat anterior and lateral aspect of the acromion, converting it from a Type 2 to a Type 1 acromion. Care was made to keep the deltoid fascia intact.   Next I created an accessory anterolateral portal to assist with visualization and instrumentation.  I debrided the poor quality edges of the supraspinatus tendon.  This was a U-shaped tear of the anterior supraspinatus.  I prepared the footprint using a burr to expose bleeding bone.    I then percutaneously placed 1 Iconix SPEED medial row anchor at the articular margin. I then shuttled all 4 strands of tape through the rotator cuff just lateral to the musculotendinous junction using a FirstPass suture passer spanning the anterior to posterior extent of the tear. All 4 strands of suture were passed through an [employer] anchor.  This was placed approximately 1 cm distal  to the lateral edge of the footprint in line with the midportion of the tear with appropriate tensioning of each suture prior to final fixation.  However, this did not achieve appropriate fixation.  Therefore a 5.5 mm anchor was placed at the angle of the rotator cuff footprint and lateral greater tuberosity.  This achieved excellent fixation.  There was a small dogear centrally.  One of the repair stitches was passed through this dogear and a knot pusher was used to arthroscopically tied a knot, further reducing the central dogear.  This construct allowed for excellent reapproximation of the rotator cuff to its native footprint without undue tension.  Appropriate compression was achieved.  The repair was stable to external and internal rotation.   Fluid was evacuated from the shoulder, and the portals were closed with 3-0 Nylon. Xeroform was applied to the portals. A sterile dressing was applied, followed by a Polar Care sleeve and a SlingShot shoulder immobilizer/sling. The patient was awakened from anesthesia without difficulty and was transferred to the PACU in stable condition.     COMPLICATIONS: none   DISPOSITION: plan for discharge home after recovery in PACU     POSTOPERATIVE PLAN: Remain in sling (except hygiene and elbow/wrist/hand RoM exercises as instructed by PT) x 4 weeks and NWB for this time. PT to begin 3-4 days after surgery.  Small/medium rotator cuff repair rehab protocol. ASA 325mg  daily x 2 weeks for DVT ppx.

## 2021-08-18 NOTE — Progress Notes (Signed)
Assisted Jamie [name] ANMD with right, ultrasound guided, interscalene  block. Side rails up, monitors on throughout procedure. See vital signs in flow sheet. Tolerated Procedure well.

## 2021-08-18 NOTE — Discharge Instructions (Addendum)
Post-Op Instructions - Rotator Cuff Repair  1. Bracing: You will wear a shoulder immobilizer or sling for 4 weeks.   2. Driving: No driving for 3 weeks post-op. When driving, do not wear the immobilizer. Ideally, we recommend no driving for 4 weeks while sling is in place as one arm will be immobilized.   3. Activity: No active lifting for 2 months. Wrist, hand, and elbow motion only. Avoid lifting the upper arm away from the body except for hygiene. You are permitted to bend and straighten the elbow passively only (no active elbow motion). You may use your hand and wrist for typing, writing, and managing utensils (cutting food). Do not lift more than a coffee cup for 8 weeks.  When sleeping or resting, inclined positions (recliner chair or wedge pillow) and a pillow under the forearm for support may provide better comfort for up to 4 weeks.  Avoid long distance travel for 4 weeks.  Return to normal activities after rotator cuff repair repair normally takes 6 months on average. If rehab goes very well, may be able to do most activities at 4 months, except overhead or contact sports.  4. Physical Therapy: Begins 3-4 days after surgery, and proceed 1 time per week for the first 6 weeks, then 1-2 times per week from weeks 6-20 post-op.  5. Medications:  - You will be provided a prescription for narcotic pain medicine. After surgery, take 1-2 narcotic tablets every 4 hours if needed for severe pain.  - A prescription for anti-nausea medication will be provided in case the narcotic medicine causes nausea - take 1 tablet every 6 hours only if nauseated.   - Take tylenol 1000 mg (2 Extra Strength tablets or 3 regular strength) every 8 hours for pain.  May decrease or stop tylenol 5 days after surgery if you are having minimal pain. - Take ASA 325mg /day x 2 weeks to help prevent DVTs/PEs (blood clots).  - DO NOT take ANY nonsteroidal anti-inflammatory pain medications (Advil, Motrin, Ibuprofen, Aleve,  Naproxen, or Naprosyn). These medicines can inhibit healing of your shoulder repair.    If you are taking prescription medication for anxiety, depression, insomnia, muscle spasm, chronic pain, or for attention deficit disorder, you are advised that you are at a higher risk of adverse effects with use of narcotics post-op, including narcotic addiction/dependence, depressed breathing, death. If you use non-prescribed substances: alcohol, marijuana, cocaine, heroin, methamphetamines, etc., you are at a higher risk of adverse effects with use of narcotics post-op, including narcotic addiction/dependence, depressed breathing, death. You are advised that taking > 50 morphine milligram equivalents (MME) of narcotic pain medication per day results in twice the risk of overdose or death. For your prescription provided: oxycodone 5 mg - taking more than 6 tablets per day would result in > 50 morphine milligram equivalents (MME) of narcotic pain medication. Be advised that we will prescribe narcotics short-term, for acute post-operative pain only - 3 weeks for major operations such as shoulder repair/reconstruction surgeries.     6. Post-Op Appointment:  Your first post-op appointment will be 10-14 days post-op.  7. Work or School: For most, but not all procedures, we advise staying out of work or school for at least 1 to 2 weeks in order to recover from the stress of surgery and to allow time for healing.   If you need a work or school note this can be provided.   8. Smoking: If you are a smoker, you need to refrain from  smoking in the postoperative period. The nicotine in cigarettes will inhibit healing of your shoulder repair and decrease the chance of successful repair. Similarly, nicotine containing products (gum, patches) should be avoided.   Post-operative Brace: Apply and remove the brace you received as you were instructed to at the time of fitting and as described in detail as the braces  instructions for use indicate.  Wear the brace for the period of time prescribed by your physician.  The brace can be cleaned with soap and water and allowed to air dry only.  Should the brace result in increased pain, decreased feeling (numbness/tingling), increased swelling or an overall worsening of your medical condition, please contact your doctor immediately.  If an emergency situation occurs as a result of wearing the brace after normal business hours, please dial 911 and seek immediate medical attention.  Let your doctor know if you have any further questions about the brace issued to you. Refer to the shoulder sling instructions for use if you have any questions regarding the correct fit of your shoulder sling.  [facility]: [phone]  Video that illustrates how to properly use a shoulder sling: "Instructions for Proper Use of an Orthopaedic Sling" [email or web address]   PERIPHERAL NERVE BLOCK PATIENT INFORMATION  Your surgeon has requested a peripheral nerve block for your surgery. This anesthetic technique provides excellent post-operative pain relief for you in a safe and effective manner. It will also help reduce the risk of nausea and vomiting and allow earlier discharge from the hospital.   The block is performed under sedation with ultrasound guidance prior to your procedure. Due to the sedation, your may or may not remember the block experience. The nerve block will begin to take effect anywhere from 5 to 30 minutes after being administered. You will be transported to the operating room from your surgery after the block is completed.   At the end of surgery, when the anesthesia wears off, you will notice a few things. Your may not be able to move or feel the part of your body targeted by the nerve block. These are normal experiences, and they will disappear as the block wears off.  If you had an interscalene nerve block performed  (which is common for shoulder surgery), your voice can be very hoarse and you may feel that you are not able to take as deep a breath as you did before surgery. Some patients may also notice a droopy eyelid on the affected side. These symptoms will resolve once the block wears off.  Pain control: The nerve block technique used is a single injection that can last anywhere from 1-3 days. The duration of the numbness can vary between individuals. After leaving the hospital, it is important that you begin to take your prescribed pain medication when you start to sense the nerve block wearing off. This will help you avoid unpleasant pain at the time the nerve block wears off, which can sometimes be in the middle of the night. The block will only cover pain in the areas targeted by the nerve block so if you experience surgical pain outside of that area, please take your prescribed pain medication. Management of the numb area: After a nerve block, you cannot feel pain, pressure, or temperature in the affected area so there is an increased risk for injury. You should take extra care to protect the affected areas until sensation and movement returns. Please take caution to not come in contact with extremely  hot or cold items because you will not be able to sense or protect yourself form the extremes of temperature.  You may experience some persistent numbness after the procedure by most neurological deficits resolve over time and the incidence of serious long term neurological complications attributable to peripheral nerve blocks are relatively uncommon.     Information for Discharge Teaching: EXPAREL (bupivacaine liposome injectable suspension)   Your surgeon or anesthesiologist gave you EXPAREL(bupivacaine) to help control your pain after surgery.  EXPAREL is a local anesthetic that provides pain relief by numbing the tissue around the surgical site. EXPAREL is designed to release pain medication over time and  can control pain for up to 72 hours. Depending on how you respond to EXPAREL, you may require less pain medication during your recovery.  Possible side effects: Temporary loss of sensation or ability to move in the area where bupivacaine was injected. Nausea, vomiting, constipation Rarely, numbness and tingling in your mouth or lips, lightheadedness, or anxiety may occur. Call your doctor right away if you think you may be experiencing any of these sensations, or if you have other questions regarding possible side effects.  Follow all other discharge instructions given to you by your surgeon or nurse. Eat a healthy diet and drink plenty of water or other fluids.  If you return to the hospital for any reason within 96 hours following the administration of EXPAREL, it is important for health care providers to know that you have received this anesthetic. A teal colored band has been placed on your arm with the date, time and amount of EXPAREL you have received in order to alert and inform your health care providers. Please leave this armband in place for the full 96 hours following administration, and then you may remove the band.

## 2021-08-18 NOTE — Anesthesia Preprocedure Evaluation (Addendum)
Anesthesia Evaluation  Patient identified by MRN, date of birth, ID band Patient awake    Reviewed: Allergy & Precautions, H&P , NPO status , Patient's Chart, lab work & pertinent test results, reviewed documented beta blocker date and time   History of Anesthesia Complications (+) PONV and history of anesthetic complications (mild with prior back surgery)  Airway Mallampati: II  TM Distance: >3 FB Neck ROM: full    Dental no notable dental hx.    Pulmonary neg pulmonary ROS, Recent URI  (treated this azithromycin, recovering well), Patient abstained from smoking., former smoker,    Pulmonary exam normal breath sounds clear to auscultation       Cardiovascular Exercise Tolerance: Good negative cardio ROS   Rhythm:regular Rate:Normal     Neuro/Psych Anxiety negative neurological ROS  negative psych ROS   GI/Hepatic negative GI ROS, Neg liver ROS,   Endo/Other  negative endocrine ROS  Renal/GU negative Renal ROS  negative genitourinary   Musculoskeletal  (+) Arthritis ,   Abdominal   Peds  Hematology negative hematology ROS (+)   Anesthesia Other Findings Chronic LBP, morphine 30 ER qday, oxy 15 q 6 prn  Reproductive/Obstetrics negative OB ROS                             Anesthesia Physical Anesthesia Plan  ASA: 2  Anesthesia Plan: General and Regional   Post-op Pain Management:    Induction:   PONV Risk Score and Plan: 4 or greater and Ondansetron, TIVA, Propofol infusion and Treatment may vary due to age or medical condition  Airway Management Planned:   Additional Equipment:   Intra-op Plan:   Post-operative Plan:   Informed Consent: I have reviewed the patients History and Physical, chart, labs and discussed the procedure including the risks, benefits and alternatives for the proposed anesthesia with the patient or authorized representative who has indicated his/her  understanding and acceptance.     Dental Advisory Given  Plan Discussed with: CRNA  Anesthesia Plan Comments:        Anesthesia Quick Evaluation

## 2021-08-18 NOTE — H&P (Signed)
Paper H&P to be scanned into permanent record. H&P reviewed. No significant changes noted.  

## 2021-08-18 NOTE — Anesthesia Procedure Notes (Signed)
Procedure Name: Intubation Date/Time: 08/18/2021 7:53 AM Performed by: Cameron Ali, CRNA Pre-anesthesia Checklist: Patient identified, Emergency Drugs available, Suction available, Patient being monitored and Timeout performed Patient Re-evaluated:Patient Re-evaluated prior to induction Oxygen Delivery Method: Circle system utilized Preoxygenation: Pre-oxygenation with 100% oxygen Induction Type: IV induction Ventilation: Mask ventilation without difficulty Laryngoscope Size: Mac and 3 Grade View: Grade I Tube type: Oral Tube size: 7.0 mm Number of attempts: 1 Airway Equipment and Method: Stylet Placement Confirmation: ETT inserted through vocal cords under direct vision, positive ETCO2 and breath sounds checked- equal and bilateral Tube secured with: Tape Dental Injury: Teeth and Oropharynx as per pre-operative assessment  Comments: LMA #4 insertion unsuccessful due to leak. LMA #3 insertion. Continued leak. Converted to GETA. Grade 1 view with head lift. Anterior larynx

## 2021-08-18 NOTE — Anesthesia Postprocedure Evaluation (Signed)
Anesthesia Post Note  Patient: Jamie [name]  Procedure(s) Performed: Right shoulder arthroscopic rotator cuff repair, biceps tenodesis, subacromial decompression (Right: Shoulder)     Patient location during evaluation: PACU Anesthesia Type: General and Regional Level of consciousness: awake and alert Pain management: pain level controlled Vital Signs Assessment: post-procedure vital signs reviewed and stable Respiratory status: spontaneous breathing, nonlabored ventilation and respiratory function stable Cardiovascular status: blood pressure returned to baseline and stable Postop Assessment: no apparent nausea or vomiting Anesthetic complications: no   No notable events documented.  April Manson

## 2021-08-18 NOTE — Transfer of Care (Signed)
Immediate Anesthesia Transfer of Care Note  Patient: Jamie [name]  Procedure(s) Performed: Right shoulder arthroscopic rotator cuff repair, biceps tenodesis, subacromial decompression (Right: Shoulder)  Patient Location: PACU  Anesthesia Type: General  Level of Consciousness: awake, alert  and patient cooperative  Airway and Oxygen Therapy: Patient Spontanous Breathing and Patient connected to supplemental oxygen  Post-op Assessment: Post-op Vital signs reviewed, Patient's Cardiovascular Status Stable, Respiratory Function Stable, Patent Airway and No signs of Nausea or vomiting  Post-op Vital Signs: Reviewed and stable  Complications: No notable events documented.

## 2021-08-20 ENCOUNTER — Encounter: Payer: Self-pay | Admitting: Family Medicine

## 2021-08-22 DIAGNOSIS — M25511 Pain in right shoulder: Secondary | ICD-10-CM | POA: Diagnosis not present

## 2021-08-22 DIAGNOSIS — M6281 Muscle weakness (generalized): Secondary | ICD-10-CM | POA: Diagnosis not present

## 2021-08-22 DIAGNOSIS — M25611 Stiffness of right shoulder, not elsewhere classified: Secondary | ICD-10-CM | POA: Diagnosis not present

## 2021-08-22 DIAGNOSIS — Z9889 Other specified postprocedural states: Secondary | ICD-10-CM | POA: Diagnosis not present

## 2021-08-23 ENCOUNTER — Ambulatory Visit: Payer: 59 | Admitting: Nurse Practitioner

## 2021-08-23 ENCOUNTER — Encounter: Payer: Self-pay | Admitting: Nurse Practitioner

## 2021-08-23 ENCOUNTER — Other Ambulatory Visit: Payer: Self-pay

## 2021-08-23 DIAGNOSIS — R051 Acute cough: Secondary | ICD-10-CM | POA: Diagnosis not present

## 2021-08-23 DIAGNOSIS — R059 Cough, unspecified: Secondary | ICD-10-CM | POA: Insufficient documentation

## 2021-08-23 MED ORDER — HYDROCOD POLST-CPM POLST ER 10-8 MG/5ML PO SUER: 5.0000 mL | Freq: Two times a day (BID) | ORAL | 0 refills | Status: DC | PRN

## 2021-08-23 MED ORDER — BENZONATATE 100 MG PO CAPS: 100.0000 mg | ORAL_CAPSULE | Freq: Three times a day (TID) | ORAL | 0 refills | Status: DC | PRN

## 2021-08-23 MED ORDER — AMOXICILLIN-POT CLAVULANATE 875-125 MG PO TABS: 1.0000 | ORAL_TABLET | Freq: Two times a day (BID) | ORAL | 0 refills | Status: DC

## 2021-08-23 NOTE — Progress Notes (Signed)
BP 99/65    Pulse 79    Temp 98.8 F (37.1 C) (Oral)    Ht 5\' 7"  (1.702 m)    Wt 163 lb 3.2 oz (74 kg)    LMP 06/20/2016 (Approximate)    SpO2 97%    BMI 25.56 kg/m    Subjective:    Patient ID: Jamie [name], female    DOB: [birth year], 51 y.o.   MRN: [id number]  HPI: Jamie [name] is a 51 y.o. female  Chief Complaint  Patient presents with   Cough    Patient states she recently went to Urgent Care the day after Christmas and patient states she was prescribed Z-pak. Patient states she is now finished with the course of treatment. Patient states she had surgery Friday and seems the cough got worse after her surgery. Patient states she started to feel better last night, but she is still having cough with a lot of phlegm. Patient states she was coughing extremely hard on Sunday, Monday and Tuesday, and started to get better. Patient states she was scare   Congestion   UPPER RESPIRATORY TRACT INFECTION Cough that started the day before Christmas and this worsened the day after Christmas.  [facility] [address], was provided a Z-Pack, finished this Thursday and had rotator cuff repair Friday -- then cough became worse after surgery.  Did start to feel a little better yesterday, but continues with cough -- breathing was better.  Past smoker -- quit 2018.  Has had 3 Covid vaccines and a flu vaccine.  Did not Covid test at home when initially sick.   Worst symptom: cough Fever: no Cough: yes Shortness of breath: yes Wheezing: yes Chest pain: no Chest tightness: yes Chest congestion: no Nasal congestion: yes Runny nose: yes Post nasal drip: yes Sneezing: no Sore throat:  mild, started a little yesterday Swollen glands: no Sinus pressure: yes Headache: yes Face pain: no Toothache: no Ear pain: yes bilateral Ear pressure: yes bilateral Eyes red/itching:no Eye drainage/crusting: no  Vomiting: no Rash: no Fatigue: yes Sick contacts: no Strep contacts: no  Context:  better Recurrent sinusitis: no Relief with OTC cold/cough medications: yes  Treatments attempted: Azithromycin and Mucinex    Relevant past medical, surgical, family and social history reviewed and updated as indicated. Interim medical history since our last visit reviewed. Allergies and medications reviewed and updated.  Review of Systems  Constitutional:  Positive for fatigue and fever. Negative for activity change and appetite change.  HENT:  Positive for congestion, postnasal drip, rhinorrhea, sinus pressure and sore throat. Negative for ear discharge, ear pain, facial swelling, sinus pain, sneezing and voice change.   Eyes:  Negative for pain and visual disturbance.  Respiratory:  Positive for cough, chest tightness and wheezing. Negative for shortness of breath.   Cardiovascular:  Negative for chest pain, palpitations and leg swelling.  Gastrointestinal: Negative.   Endocrine: Negative.   Musculoskeletal:  Negative for myalgias.  Neurological:  Positive for headaches. Negative for dizziness and numbness.  Psychiatric/Behavioral: Negative.     Per HPI unless specifically indicated above     Objective:    BP 99/65    Pulse 79    Temp 98.8 F (37.1 C) (Oral)    Ht 5\' 7"  (1.702 m)    Wt 163 lb 3.2 oz (74 kg)    LMP 06/20/2016 (Approximate)    SpO2 97%    BMI 25.56 kg/m   Wt Readings from Last 3 Encounters:  08/23/21 163 lb 3.2 oz (74 kg)  08/18/21 160 lb (72.6 kg)  07/18/21 165 lb 3.2 oz (74.9 kg)    Physical Exam Vitals and nursing note reviewed.  Constitutional:      General: She is awake. She is not in acute distress.    Appearance: She is well-developed and well-groomed. She is not ill-appearing or toxic-appearing.  HENT:     Head: Normocephalic.     Right Ear: Hearing, ear canal and external ear normal. A middle ear effusion is present.     Left Ear: Hearing, ear canal and external ear normal. A middle ear effusion is present.     Nose: Rhinorrhea present. Rhinorrhea  is clear.     Right Sinus: No maxillary sinus tenderness or frontal sinus tenderness.     Left Sinus: No maxillary sinus tenderness or frontal sinus tenderness.     Mouth/Throat:     Mouth: Mucous membranes are moist.     Pharynx: Posterior oropharyngeal erythema (mild with cobblestoning) present. No pharyngeal swelling or oropharyngeal exudate.  Eyes:     General: Lids are normal.        Right eye: No discharge.        Left eye: No discharge.     Conjunctiva/sclera: Conjunctivae normal.     Pupils: Pupils are equal, round, and reactive to light.  Neck:     Thyroid: No thyromegaly.     Vascular: No carotid bruit.  Cardiovascular:     Rate and Rhythm: Normal rate and regular rhythm.     Heart sounds: Normal heart sounds. No murmur heard.   No gallop.  Pulmonary:     Effort: Pulmonary effort is normal. No accessory muscle usage or respiratory distress.     Breath sounds: Wheezing present.     Comments: Expiratory wheezes noted throughout Abdominal:     General: Bowel sounds are normal.     Palpations: Abdomen is soft. There is no hepatomegaly or splenomegaly.  Musculoskeletal:     Cervical back: Normal range of motion and neck supple.     Right lower leg: No edema.     Left lower leg: No edema.  Lymphadenopathy:     Head:     Right side of head: No submental, submandibular, tonsillar, preauricular or posterior auricular adenopathy.     Left side of head: No submental, submandibular, tonsillar, preauricular or posterior auricular adenopathy.     Cervical: No cervical adenopathy.  Skin:    General: Skin is warm and dry.  Neurological:     Mental Status: She is alert and oriented to person, place, and time.  Psychiatric:        Attention and Perception: Attention normal.        Mood and Affect: Mood normal.        Behavior: Behavior normal. Behavior is cooperative.        Thought Content: Thought content normal.        Judgment: Judgment normal.    Results for orders placed  or performed during the hospital encounter of 07/18/21  Synovial cell count + diff, w/ crystals  Result Value Ref Range   Color, Synovial YELLOW (A) YELLOW   Appearance-Synovial CLEAR CLEAR   Crystals, Fluid NO CRYSTALS SEEN    WBC, Synovial 163 0 - 200 /cu mm   Neutrophil, Synovial 14 %   Lymphocytes-Synovial Fld 53 %   Monocyte-Macrophage-Synovial Fluid 33 %   Eosinophils-Synovial 0 %      Assessment & Plan:  Problem List Items Addressed This Visit       Other   Cough    Ongoing for > 1 week with poor response to Zpack.  At this point no Covid or flu testing, low suspicion for this and past treatment dates.  Will send in Augmentin for 7 day treatment + Tessalon and Tussionex to use PRN.  Use Albuterol inhaler at home as needed. Recommend: - Increased rest - Increasing Fluids - Acetaminophen as needed for fever/pain.  - Salt water gargling, chloraseptic spray and throat lozenges - Mucinex.  - Saline sinus flushes or a neti pot.  Return to office if worsening or ongoing symptoms present.  Will obtain CXR if ongoing.        Follow up plan: Return if symptoms worsen or fail to improve.

## 2021-08-23 NOTE — Assessment & Plan Note (Addendum)
Ongoing for > 1 week with poor response to Zpack.  At this point no Covid or flu testing, low suspicion for this and past treatment dates.  Will send in Augmentin for 7 day treatment + Tessalon and Tussionex to use PRN.  Use Albuterol inhaler at home as needed. Recommend: - Increased rest - Increasing Fluids - Acetaminophen as needed for fever/pain.  - Salt water gargling, chloraseptic spray and throat lozenges - Mucinex.  - Saline sinus flushes or a neti pot.  Return to office if worsening or ongoing symptoms present.  Will obtain CXR if ongoing.

## 2021-08-23 NOTE — Patient Instructions (Signed)

## 2021-08-24 ENCOUNTER — Telehealth (INDEPENDENT_AMBULATORY_CARE_PROVIDER_SITE_OTHER): Payer: 59 | Admitting: Family Medicine

## 2021-08-24 ENCOUNTER — Telehealth: Payer: Self-pay | Admitting: Family Medicine

## 2021-08-24 ENCOUNTER — Ambulatory Visit: Payer: Self-pay | Admitting: *Deleted

## 2021-08-24 ENCOUNTER — Encounter: Payer: Self-pay | Admitting: Family Medicine

## 2021-08-24 DIAGNOSIS — J0100 Acute maxillary sinusitis, unspecified: Secondary | ICD-10-CM

## 2021-08-24 DIAGNOSIS — F3342 Major depressive disorder, recurrent, in full remission: Secondary | ICD-10-CM

## 2021-08-24 DIAGNOSIS — F411 Generalized anxiety disorder: Secondary | ICD-10-CM

## 2021-08-24 DIAGNOSIS — F410 Panic disorder [episodic paroxysmal anxiety] without agoraphobia: Secondary | ICD-10-CM

## 2021-08-24 MED ORDER — BUSPIRONE HCL 10 MG PO TABS: 10.0000 mg | ORAL_TABLET | Freq: Two times a day (BID) | ORAL | 1 refills | Status: DC

## 2021-08-24 MED ORDER — DOXYCYCLINE HYCLATE 100 MG PO TABS: 100.0000 mg | ORAL_TABLET | Freq: Two times a day (BID) | ORAL | 0 refills | Status: DC

## 2021-08-24 MED ORDER — PAROXETINE HCL 30 MG PO TABS: 30.0000 mg | ORAL_TABLET | Freq: Every day | ORAL | 1 refills | Status: DC

## 2021-08-24 NOTE — Telephone Encounter (Signed)
°  Chief Complaint: reaction to the Augmentin prescribed by Marnee Guarneri NP yesterday Symptoms: vomiting most of the night along with diarrhea after taking the medication last night Frequency: All night.   No longer vomiting just a little diarrhea this morning Pertinent Negatives: Patient denies vomiting this morning but requesting something else be called in in place of the Augmentin Disposition: [] ED /[] Urgent Care (no appt availability in office) / [] Appointment(In office/virtual)/ []  [facility]/ [] Home Care/ [] Refused Recommended Disposition /[] [facility]/ [x]  Follow-up with PCP Additional Notes: Please advise pt.   Thanks.  Please send new rx to [facility].

## 2021-08-24 NOTE — Progress Notes (Signed)
LMP 06/20/2016 (Approximate)    Subjective:    Patient ID: Jamie [name], female    DOB: 1971/06/17, 51 y.o.   MRN: [id number]  HPI: Jamie [name] is a 51 y.o. female  Chief Complaint  Patient presents with   Anxiety    Follow up    Medication Reaction    Patient states she was prescribed Amoxicillin yesterday and has been vomiting all night, would like something else.    ANXIETY/DEPRESSION Duration: chronic Status:stable Anxious mood: yes  Excessive worrying: yes Irritability: yes  Sweating: no Nausea: no Palpitations:no Hyperventilation: no Panic attacks: yes Agoraphobia: no  Obscessions/compulsions: no Depressed mood: yes Depression screen [facility] 2/9 08/24/2021 07/18/2021 02/10/2021  Decreased Interest 3 3 3   Down, Depressed, Hopeless 2 3 3   PHQ - 2 Score 5 6 6   Altered sleeping 3 3 3   Tired, decreased energy 3 3 3   Change in appetite 3 3 3   Feeling bad or failure about yourself  2 2 1   Trouble concentrating 2 3 3   Moving slowly or fidgety/restless 3 1 1   Suicidal thoughts 0 0 0  PHQ-9 Score 21 21 20   Difficult doing work/chores Somewhat difficult Very difficult Not difficult at all  Some encounter information is confidential and restricted. Go to Review Flowsheets activity to see all data.  Some recent data might be hidden   Anhedonia: yes Weight changes: no Insomnia: yes hard to fall asleep  Hypersomnia: no Fatigue/loss of energy: yes Feelings of worthlessness: yes Feelings of guilt: no Impaired concentration/indecisiveness: yes Suicidal ideations: no  Crying spells: no Recent Stressors/Life Changes: yes   Relationship problems: no   Family stress: yes     Financial stress: yes    Job stress: yes    Recent death/loss: no  UPPER RESPIRATORY TRACT INFECTION Duration: about 2+ weeks Worst symptom: congestion Fever: no Cough: yes Shortness of breath: no Wheezing: yes Chest pain: no Chest tightness: no Chest congestion: yes Nasal congestion:  yes Runny nose: yes Post nasal drip: yes Sneezing: no Sore throat: no Swollen glands: no Sinus pressure: yes Headache: yes Face pain: yes Toothache: no Ear pain: yes bilateral Ear pressure: no  Eyes red/itching:no Eye drainage/crusting: no  Vomiting: yes Rash: no Fatigue: yes Sick contacts: no Strep contacts: no  Context: stable Recurrent sinusitis: no Relief with OTC cold/cough medications: no  Treatments attempted: cold/sinus, mucinex, anti-histamine, pseudoephedrine, cough syrup, and antibiotics    Relevant past medical, surgical, family and social history reviewed and updated as indicated. Interim medical history since our last visit reviewed. Allergies and medications reviewed and updated.  Review of Systems  Constitutional:  Positive for chills, diaphoresis and fatigue. Negative for activity change, appetite change, fever and unexpected weight change.  HENT:  Positive for congestion, rhinorrhea, sinus pressure, sinus pain and sore throat. Negative for dental problem, drooling, ear discharge, ear pain, facial swelling, hearing loss, mouth sores, nosebleeds, postnasal drip, sneezing, tinnitus, trouble swallowing and voice change.   Eyes: Negative.   Respiratory:  Positive for cough. Negative for apnea, choking, chest tightness, shortness of breath, wheezing and stridor.   Cardiovascular: Negative.   Skin: Negative.   Psychiatric/Behavioral:  Positive for dysphoric mood. Negative for agitation, behavioral problems, confusion, decreased concentration, hallucinations, self-injury, sleep disturbance and suicidal ideas. The patient is nervous/anxious. The patient is not hyperactive.    Per HPI unless specifically indicated above     Objective:    LMP 06/20/2016 (Approximate)   Wt Readings from Last 3 Encounters:  08/23/21 163 lb  3.2 oz (74 kg)  08/18/21 160 lb (72.6 kg)  07/18/21 165 lb 3.2 oz (74.9 kg)    Physical Exam Vitals and nursing note reviewed.   Constitutional:      General: She is not in acute distress.    Appearance: Normal appearance. She is ill-appearing. She is not toxic-appearing or diaphoretic.     Comments: Laying in bed  HENT:     Head: Normocephalic and atraumatic.     Right Ear: External ear normal.     Left Ear: External ear normal.     Nose: Nose normal.     Mouth/Throat:     Mouth: Mucous membranes are moist.     Pharynx: Oropharynx is clear.  Eyes:     General: No scleral icterus.       Right eye: No discharge.        Left eye: No discharge.     Conjunctiva/sclera: Conjunctivae normal.     Pupils: Pupils are equal, round, and reactive to light.  Pulmonary:     Effort: Pulmonary effort is normal. No respiratory distress.     Comments: Speaking in full sentences Musculoskeletal:        General: Normal range of motion.     Cervical back: Normal range of motion.  Skin:    Coloration: Skin is not jaundiced or pale.     Findings: No bruising, erythema, lesion or rash.  Neurological:     Mental Status: She is alert and oriented to person, place, and time. Mental status is at baseline.  Psychiatric:        Mood and Affect: Mood normal.        Behavior: Behavior normal.        Thought Content: Thought content normal.        Judgment: Judgment normal.    Results for orders placed or performed during the hospital encounter of 07/18/21  Synovial cell count + diff, w/ crystals  Result Value Ref Range   Color, Synovial YELLOW (A) YELLOW   Appearance-Synovial CLEAR CLEAR   Crystals, Fluid NO CRYSTALS SEEN    WBC, Synovial 163 0 - 200 /cu mm   Neutrophil, Synovial 14 %   Lymphocytes-Synovial Fld 53 %   Monocyte-Macrophage-Synovial Fluid 33 %   Eosinophils-Synovial 0 %      Assessment & Plan:   Problem List Items Addressed This Visit       Other   GAD (generalized anxiety disorder)    Stable. Awaiting new appt with psychiatry. Refills given today. Call with any concerns.       Relevant Medications    PARoxetine (PAXIL) 30 MG tablet   busPIRone (BUSPAR) 10 MG tablet   Panic attack    Stable. Awaiting new appt with psychiatry. Refills given today. Call with any concerns.       Relevant Medications   PARoxetine (PAXIL) 30 MG tablet   busPIRone (BUSPAR) 10 MG tablet   MDD (major depressive disorder), recurrent, in full remission ([place])    Stable. Awaiting new appt with psychiatry. Refills given today. Call with any concerns.       Relevant Medications   PARoxetine (PAXIL) 30 MG tablet   busPIRone (BUSPAR) 10 MG tablet   Other Visit Diagnoses     Acute non-recurrent maxillary sinusitis    -  Primary   Could not tolerate augmentin. Will change to doxy. Call if not getting better or getting worse.    Relevant Medications   doxycycline (VIBRA-TABS) 100 MG  tablet        Follow up plan: Return in about 3 months (around 11/22/2021).   This visit was completed via video visit through MyChart due to the restrictions of the COVID-19 pandemic. All issues as above were discussed and addressed. Physical exam was done as above through visual confirmation on video through MyChart. If it was felt that the patient should be evaluated in the office, they were directed there. The patient verbally consented to this visit. Location of the patient: home Location of the provider: work Those involved with this call:  Provider: Park Liter, DO CMA: Louanna Raw, [place] Desk/Registration: [employer]  Time spent on call:  25 minutes with patient face to face via video conference. More than 50% of this time was spent in counseling and coordination of care. 40 minutes total spent in review of patient's record and preparation of their chart.

## 2021-08-24 NOTE — Assessment & Plan Note (Signed)
Stable. Awaiting new appt with psychiatry. Refills given today. Call with any concerns.

## 2021-08-24 NOTE — Telephone Encounter (Signed)
Reason for Disposition  [1] Caller has URGENT medicine question about med that PCP or specialist prescribed AND [2] triager unable to answer question    Needs a different antibiotic called in.  Answer Assessment - Initial Assessment Questions 1. NAME of MEDICATION: "What medicine are you calling about?"     Pt seen by [facility] yesterday.   Given Augmentin which she allergic to.  2. QUESTION: "What is your question?" (e.g., double dose of medicine, side effect)     Can she call in something different? 3. PRESCRIBING HCP: "Who prescribed it?" Reason: if prescribed by specialist, call should be referred to that group.     Marnee Guarneri, NP 4. SYMPTOMS: "Do you have any symptoms?"     Vomiting and diarrhea all night after taking it last night.   I've had this reaction to it before years ago but forgot.   Just having a little diarrhea this morning no longer vomiting. 5. SEVERITY: If symptoms are present, ask "Are they mild, moderate or severe?"     Severe last night 6. PREGNANCY:  "Is there any chance that you are pregnant?" "When was your last menstrual period?"     Not asked  Protocols used: Medication Question Call-A-AH

## 2021-08-24 NOTE — Telephone Encounter (Signed)
Pt is allergic to amoxicillin-clavulanate (AUGMENTIN) 875-125 MG tablet and was throwing up last night after taking it and wants to know if provider can call in something different / please advise

## 2021-08-28 ENCOUNTER — Ambulatory Visit: Payer: 59 | Admitting: Family Medicine

## 2021-08-31 DIAGNOSIS — M25611 Stiffness of right shoulder, not elsewhere classified: Secondary | ICD-10-CM | POA: Diagnosis not present

## 2021-08-31 DIAGNOSIS — Z9889 Other specified postprocedural states: Secondary | ICD-10-CM | POA: Diagnosis not present

## 2021-08-31 DIAGNOSIS — M6281 Muscle weakness (generalized): Secondary | ICD-10-CM | POA: Diagnosis not present

## 2021-08-31 DIAGNOSIS — M25511 Pain in right shoulder: Secondary | ICD-10-CM | POA: Diagnosis not present

## 2021-09-05 ENCOUNTER — Other Ambulatory Visit (HOSPITAL_COMMUNITY): Payer: Self-pay

## 2021-09-07 ENCOUNTER — Other Ambulatory Visit (HOSPITAL_COMMUNITY): Payer: Self-pay

## 2021-09-07 DIAGNOSIS — M25611 Stiffness of right shoulder, not elsewhere classified: Secondary | ICD-10-CM | POA: Diagnosis not present

## 2021-09-07 DIAGNOSIS — M25511 Pain in right shoulder: Secondary | ICD-10-CM | POA: Diagnosis not present

## 2021-09-07 DIAGNOSIS — M6281 Muscle weakness (generalized): Secondary | ICD-10-CM | POA: Diagnosis not present

## 2021-09-07 DIAGNOSIS — Z9889 Other specified postprocedural states: Secondary | ICD-10-CM | POA: Diagnosis not present

## 2021-09-12 ENCOUNTER — Other Ambulatory Visit: Payer: Self-pay

## 2021-09-12 DIAGNOSIS — M25611 Stiffness of right shoulder, not elsewhere classified: Secondary | ICD-10-CM | POA: Diagnosis not present

## 2021-09-12 DIAGNOSIS — Z9889 Other specified postprocedural states: Secondary | ICD-10-CM | POA: Diagnosis not present

## 2021-09-12 DIAGNOSIS — M25511 Pain in right shoulder: Secondary | ICD-10-CM | POA: Diagnosis not present

## 2021-09-12 DIAGNOSIS — M6281 Muscle weakness (generalized): Secondary | ICD-10-CM | POA: Diagnosis not present

## 2021-09-12 MED ORDER — OXYCODONE HCL 5 MG PO TABS
ORAL_TABLET | ORAL | 0 refills | Status: DC
  Filled 2021-09-12: qty 30, 7d supply, fill #0

## 2021-09-15 DIAGNOSIS — Z9889 Other specified postprocedural states: Secondary | ICD-10-CM | POA: Diagnosis not present

## 2021-09-15 DIAGNOSIS — M25611 Stiffness of right shoulder, not elsewhere classified: Secondary | ICD-10-CM | POA: Diagnosis not present

## 2021-09-15 DIAGNOSIS — M25511 Pain in right shoulder: Secondary | ICD-10-CM | POA: Diagnosis not present

## 2021-09-15 DIAGNOSIS — M6281 Muscle weakness (generalized): Secondary | ICD-10-CM | POA: Diagnosis not present

## 2021-09-19 DIAGNOSIS — M6281 Muscle weakness (generalized): Secondary | ICD-10-CM | POA: Diagnosis not present

## 2021-09-19 DIAGNOSIS — M25511 Pain in right shoulder: Secondary | ICD-10-CM | POA: Diagnosis not present

## 2021-09-19 DIAGNOSIS — M25611 Stiffness of right shoulder, not elsewhere classified: Secondary | ICD-10-CM | POA: Diagnosis not present

## 2021-09-19 DIAGNOSIS — Z9889 Other specified postprocedural states: Secondary | ICD-10-CM | POA: Diagnosis not present

## 2021-09-22 ENCOUNTER — Other Ambulatory Visit: Payer: Self-pay

## 2021-09-22 DIAGNOSIS — M25511 Pain in right shoulder: Secondary | ICD-10-CM | POA: Diagnosis not present

## 2021-09-22 DIAGNOSIS — Z9889 Other specified postprocedural states: Secondary | ICD-10-CM | POA: Diagnosis not present

## 2021-09-22 DIAGNOSIS — M6281 Muscle weakness (generalized): Secondary | ICD-10-CM | POA: Diagnosis not present

## 2021-09-22 DIAGNOSIS — M25611 Stiffness of right shoulder, not elsewhere classified: Secondary | ICD-10-CM | POA: Diagnosis not present

## 2021-09-26 DIAGNOSIS — Z9889 Other specified postprocedural states: Secondary | ICD-10-CM | POA: Diagnosis not present

## 2021-09-26 DIAGNOSIS — M25511 Pain in right shoulder: Secondary | ICD-10-CM | POA: Diagnosis not present

## 2021-09-26 DIAGNOSIS — M6281 Muscle weakness (generalized): Secondary | ICD-10-CM | POA: Diagnosis not present

## 2021-09-26 DIAGNOSIS — M25611 Stiffness of right shoulder, not elsewhere classified: Secondary | ICD-10-CM | POA: Diagnosis not present

## 2021-09-29 DIAGNOSIS — Z9889 Other specified postprocedural states: Secondary | ICD-10-CM | POA: Diagnosis not present

## 2021-09-29 DIAGNOSIS — M25611 Stiffness of right shoulder, not elsewhere classified: Secondary | ICD-10-CM | POA: Diagnosis not present

## 2021-09-29 DIAGNOSIS — M6281 Muscle weakness (generalized): Secondary | ICD-10-CM | POA: Diagnosis not present

## 2021-09-29 DIAGNOSIS — M25511 Pain in right shoulder: Secondary | ICD-10-CM | POA: Diagnosis not present

## 2021-10-03 ENCOUNTER — Other Ambulatory Visit (HOSPITAL_COMMUNITY): Payer: Self-pay

## 2021-10-03 DIAGNOSIS — M25511 Pain in right shoulder: Secondary | ICD-10-CM | POA: Diagnosis not present

## 2021-10-03 DIAGNOSIS — M25611 Stiffness of right shoulder, not elsewhere classified: Secondary | ICD-10-CM | POA: Diagnosis not present

## 2021-10-03 DIAGNOSIS — M6281 Muscle weakness (generalized): Secondary | ICD-10-CM | POA: Diagnosis not present

## 2021-10-03 DIAGNOSIS — Z9889 Other specified postprocedural states: Secondary | ICD-10-CM | POA: Diagnosis not present

## 2021-10-04 ENCOUNTER — Other Ambulatory Visit (HOSPITAL_COMMUNITY): Payer: Self-pay

## 2021-10-05 DIAGNOSIS — M6281 Muscle weakness (generalized): Secondary | ICD-10-CM | POA: Diagnosis not present

## 2021-10-05 DIAGNOSIS — M25511 Pain in right shoulder: Secondary | ICD-10-CM | POA: Diagnosis not present

## 2021-10-05 DIAGNOSIS — M25611 Stiffness of right shoulder, not elsewhere classified: Secondary | ICD-10-CM | POA: Diagnosis not present

## 2021-10-05 DIAGNOSIS — Z9889 Other specified postprocedural states: Secondary | ICD-10-CM | POA: Diagnosis not present

## 2021-10-06 ENCOUNTER — Other Ambulatory Visit: Payer: Self-pay

## 2021-10-06 MED ORDER — OXYCODONE HCL 5 MG PO TABS
ORAL_TABLET | ORAL | 0 refills | Status: DC
  Filled 2021-10-06: qty 20, 10d supply, fill #0

## 2021-10-10 DIAGNOSIS — M6281 Muscle weakness (generalized): Secondary | ICD-10-CM | POA: Diagnosis not present

## 2021-10-10 DIAGNOSIS — Z9889 Other specified postprocedural states: Secondary | ICD-10-CM | POA: Diagnosis not present

## 2021-10-10 DIAGNOSIS — M25511 Pain in right shoulder: Secondary | ICD-10-CM | POA: Diagnosis not present

## 2021-10-10 DIAGNOSIS — M25611 Stiffness of right shoulder, not elsewhere classified: Secondary | ICD-10-CM | POA: Diagnosis not present

## 2021-10-13 DIAGNOSIS — M25511 Pain in right shoulder: Secondary | ICD-10-CM | POA: Diagnosis not present

## 2021-10-13 DIAGNOSIS — Z9889 Other specified postprocedural states: Secondary | ICD-10-CM | POA: Diagnosis not present

## 2021-10-13 DIAGNOSIS — M6281 Muscle weakness (generalized): Secondary | ICD-10-CM | POA: Diagnosis not present

## 2021-10-13 DIAGNOSIS — M25611 Stiffness of right shoulder, not elsewhere classified: Secondary | ICD-10-CM | POA: Diagnosis not present

## 2021-10-17 DIAGNOSIS — M25511 Pain in right shoulder: Secondary | ICD-10-CM | POA: Diagnosis not present

## 2021-10-17 DIAGNOSIS — M25611 Stiffness of right shoulder, not elsewhere classified: Secondary | ICD-10-CM | POA: Diagnosis not present

## 2021-10-17 DIAGNOSIS — M6281 Muscle weakness (generalized): Secondary | ICD-10-CM | POA: Diagnosis not present

## 2021-10-17 DIAGNOSIS — Z9889 Other specified postprocedural states: Secondary | ICD-10-CM | POA: Diagnosis not present

## 2021-10-19 DIAGNOSIS — M6281 Muscle weakness (generalized): Secondary | ICD-10-CM | POA: Diagnosis not present

## 2021-10-19 DIAGNOSIS — M25511 Pain in right shoulder: Secondary | ICD-10-CM | POA: Diagnosis not present

## 2021-10-19 DIAGNOSIS — Z9889 Other specified postprocedural states: Secondary | ICD-10-CM | POA: Diagnosis not present

## 2021-10-19 DIAGNOSIS — M25611 Stiffness of right shoulder, not elsewhere classified: Secondary | ICD-10-CM | POA: Diagnosis not present

## 2021-10-24 DIAGNOSIS — M25611 Stiffness of right shoulder, not elsewhere classified: Secondary | ICD-10-CM | POA: Diagnosis not present

## 2021-10-24 DIAGNOSIS — M25511 Pain in right shoulder: Secondary | ICD-10-CM | POA: Diagnosis not present

## 2021-10-24 DIAGNOSIS — Z9889 Other specified postprocedural states: Secondary | ICD-10-CM | POA: Diagnosis not present

## 2021-10-24 DIAGNOSIS — M6281 Muscle weakness (generalized): Secondary | ICD-10-CM | POA: Diagnosis not present

## 2021-10-25 ENCOUNTER — Other Ambulatory Visit (HOSPITAL_COMMUNITY): Payer: Self-pay

## 2021-10-25 ENCOUNTER — Other Ambulatory Visit: Payer: Self-pay

## 2021-10-25 ENCOUNTER — Other Ambulatory Visit: Payer: Self-pay | Admitting: Dermatology

## 2021-10-25 DIAGNOSIS — L309 Dermatitis, unspecified: Secondary | ICD-10-CM

## 2021-10-25 MED ORDER — DUPIXENT 300 MG/2ML ~~LOC~~ SOAJ
300.0000 mg | SUBCUTANEOUS | 1 refills | Status: DC
  Filled 2021-10-25: qty 4, 28d supply, fill #0

## 2021-10-25 NOTE — Progress Notes (Signed)
Dupixent RF until follow up appt with Dr. Nicole Kindred ?

## 2021-10-26 ENCOUNTER — Other Ambulatory Visit (HOSPITAL_COMMUNITY): Payer: Self-pay

## 2021-10-26 ENCOUNTER — Other Ambulatory Visit: Payer: Self-pay | Admitting: Pharmacist

## 2021-10-26 DIAGNOSIS — M25611 Stiffness of right shoulder, not elsewhere classified: Secondary | ICD-10-CM | POA: Diagnosis not present

## 2021-10-26 DIAGNOSIS — L309 Dermatitis, unspecified: Secondary | ICD-10-CM

## 2021-10-26 DIAGNOSIS — M961 Postlaminectomy syndrome, not elsewhere classified: Secondary | ICD-10-CM | POA: Diagnosis not present

## 2021-10-26 DIAGNOSIS — M25511 Pain in right shoulder: Secondary | ICD-10-CM | POA: Diagnosis not present

## 2021-10-26 DIAGNOSIS — M5416 Radiculopathy, lumbar region: Secondary | ICD-10-CM | POA: Diagnosis not present

## 2021-10-26 DIAGNOSIS — M542 Cervicalgia: Secondary | ICD-10-CM | POA: Diagnosis not present

## 2021-10-26 DIAGNOSIS — M6281 Muscle weakness (generalized): Secondary | ICD-10-CM | POA: Diagnosis not present

## 2021-10-26 DIAGNOSIS — Z9889 Other specified postprocedural states: Secondary | ICD-10-CM | POA: Diagnosis not present

## 2021-10-26 DIAGNOSIS — F1120 Opioid dependence, uncomplicated: Secondary | ICD-10-CM | POA: Diagnosis not present

## 2021-10-26 MED ORDER — DUPIXENT 300 MG/2ML ~~LOC~~ SOAJ
300.0000 mg | SUBCUTANEOUS | 1 refills | Status: DC
  Filled 2021-10-26: qty 4, 28d supply, fill #0

## 2021-10-27 ENCOUNTER — Other Ambulatory Visit (HOSPITAL_COMMUNITY): Payer: Self-pay

## 2021-10-31 ENCOUNTER — Other Ambulatory Visit (HOSPITAL_COMMUNITY): Payer: Self-pay

## 2021-10-31 DIAGNOSIS — M25511 Pain in right shoulder: Secondary | ICD-10-CM | POA: Diagnosis not present

## 2021-10-31 DIAGNOSIS — M25611 Stiffness of right shoulder, not elsewhere classified: Secondary | ICD-10-CM | POA: Diagnosis not present

## 2021-10-31 DIAGNOSIS — Z9889 Other specified postprocedural states: Secondary | ICD-10-CM | POA: Diagnosis not present

## 2021-10-31 DIAGNOSIS — M6281 Muscle weakness (generalized): Secondary | ICD-10-CM | POA: Diagnosis not present

## 2021-11-02 DIAGNOSIS — Z9889 Other specified postprocedural states: Secondary | ICD-10-CM | POA: Diagnosis not present

## 2021-11-02 DIAGNOSIS — M6281 Muscle weakness (generalized): Secondary | ICD-10-CM | POA: Diagnosis not present

## 2021-11-02 DIAGNOSIS — M25511 Pain in right shoulder: Secondary | ICD-10-CM | POA: Diagnosis not present

## 2021-11-02 DIAGNOSIS — M25611 Stiffness of right shoulder, not elsewhere classified: Secondary | ICD-10-CM | POA: Diagnosis not present

## 2021-11-07 DIAGNOSIS — M6281 Muscle weakness (generalized): Secondary | ICD-10-CM | POA: Diagnosis not present

## 2021-11-07 DIAGNOSIS — Z9889 Other specified postprocedural states: Secondary | ICD-10-CM | POA: Diagnosis not present

## 2021-11-07 DIAGNOSIS — M25611 Stiffness of right shoulder, not elsewhere classified: Secondary | ICD-10-CM | POA: Diagnosis not present

## 2021-11-07 DIAGNOSIS — M25511 Pain in right shoulder: Secondary | ICD-10-CM | POA: Diagnosis not present

## 2021-11-10 ENCOUNTER — Telehealth: Payer: Self-pay

## 2021-11-10 NOTE — Telephone Encounter (Signed)
Unable to see if referral to psych was placed. Please review and forward to [facility] in referrals if appropriate. ? ?Copied from [place] [id number]. Topic: General - Other ?>> Nov 10, 2021  9:06 AM Leward Quan A wrote: ?Reason for CRM: Patient wanted to inform Dr Wynetta Emery that she have not heard from anyone about a referral. Please advise ?>> Nov 10, 2021 11:15 AM Barth Kirks B wrote: ?Called patient to find out what type of referral she has not heard about. Patient states she has not heard about referral to psych.  ?

## 2021-11-10 NOTE — Telephone Encounter (Signed)
Jamie [name], can you look into this please? Looks like her last referral was closed.  ?

## 2021-11-14 ENCOUNTER — Encounter: Payer: Self-pay | Admitting: Family Medicine

## 2021-11-14 ENCOUNTER — Telehealth: Payer: Self-pay | Admitting: Family Medicine

## 2021-11-14 DIAGNOSIS — M2550 Pain in unspecified joint: Secondary | ICD-10-CM | POA: Diagnosis not present

## 2021-11-14 DIAGNOSIS — M791 Myalgia, unspecified site: Secondary | ICD-10-CM | POA: Diagnosis not present

## 2021-11-14 DIAGNOSIS — R5382 Chronic fatigue, unspecified: Secondary | ICD-10-CM | POA: Diagnosis not present

## 2021-11-14 NOTE — Telephone Encounter (Signed)
Pt scheduled tomorrow at 8:40 ?

## 2021-11-14 NOTE — Telephone Encounter (Signed)
appt

## 2021-11-14 NOTE — Telephone Encounter (Signed)
Copied from [place] [id number]. Topic: Referral - Status ?>> Nov 14, 2021  8:27 AM Alanda Slim E wrote: ?Reason for CRM: Nevin Bloodgood from Dr. Garen Lah office / they wont be able to schedule this pt / she was a former pt that was sent to collections ?

## 2021-11-15 ENCOUNTER — Encounter: Payer: Self-pay | Admitting: Family Medicine

## 2021-11-15 ENCOUNTER — Ambulatory Visit (INDEPENDENT_AMBULATORY_CARE_PROVIDER_SITE_OTHER): Payer: 59 | Admitting: Family Medicine

## 2021-11-15 ENCOUNTER — Other Ambulatory Visit: Payer: Self-pay

## 2021-11-15 VITALS — BP 121/73 | HR 78 | Temp 98.3°F | Wt 167.2 lb

## 2021-11-15 DIAGNOSIS — F411 Generalized anxiety disorder: Secondary | ICD-10-CM

## 2021-11-15 DIAGNOSIS — F410 Panic disorder [episodic paroxysmal anxiety] without agoraphobia: Secondary | ICD-10-CM | POA: Diagnosis not present

## 2021-11-15 DIAGNOSIS — F332 Major depressive disorder, recurrent severe without psychotic features: Secondary | ICD-10-CM

## 2021-11-15 MED ORDER — LORAZEPAM 0.5 MG PO TABS: 0.5000 mg | ORAL_TABLET | Freq: Every day | ORAL | 0 refills | Status: DC | PRN

## 2021-11-15 MED ORDER — PAROXETINE HCL 30 MG PO TABS: 45.0000 mg | ORAL_TABLET | Freq: Every day | ORAL | 1 refills | Status: DC

## 2021-11-15 NOTE — Assessment & Plan Note (Addendum)
Not doing well. Has not been able to get in with psychiatry locally- will get her into psychiatry in [place]. Appointment scheduled today. Will increase her paxil to '45mg'$  and recheck 1 month. Continue buspar and hydroxyzine. Would like to give 10 of 0.'5mg'$  lorazepam PRN, but patient is on controlled substance agreement with pain management. Call made to see if they are OK with this, they are fine with it as long as it is not long term. Rx for 10 0.'5mg'$  lorazepam sent to her pharmacy. ?

## 2021-11-15 NOTE — Assessment & Plan Note (Signed)
See discussion under depression.  ?

## 2021-11-15 NOTE — Patient Instructions (Signed)
You will have to go to the office and fill out paperwork before the appointment  ?Monday - Thursday 8-4 lunch is 12-1  ?and paperwork address is  [address]  ?[place] [address]  ?

## 2021-11-15 NOTE — Progress Notes (Signed)
? ?BP 121/73   Pulse 78   Temp 98.3 ?F (36.8 ?C)   Wt 167 lb 3.2 oz (75.8 kg)   LMP 06/20/2016 (Approximate)   SpO2 98%   BMI 26.19 kg/m?   ? ?Subjective:  ? ? Patient ID: Jamie [name], female    DOB: [birth year], 51 y.o.   MRN: [id number] ? ?HPI: ?Jackee Glasner is a 51 y.o. female ? ?Chief Complaint  ?Patient presents with  ? Anxiety  ?  Patient states she has been having a lot more anxiety lately and requesting medication. Has not been able to get into psychiatry.   ? Depression  ? ?ANXIETY/DEPRESSION- Has not been seeing psychiatry in about a year. She notes that her mood has not been doing well. She notes that her parents have not been doing well. She notes that her job has been very stressful. She is feeling very overwhelmed.  ?Duration: chronic ?Status:exacerbated ?Anxious mood: yes  ?Excessive worrying: yes ?Irritability: yes  ?Sweating: no ?Nausea: yes ?Palpitations:yes ?Hyperventilation: no ?Panic attacks: yes ?Agoraphobia: no  ?Obscessions/compulsions: no ?Depressed mood: yes ? ?  11/15/2021  ?  8:41 AM 08/24/2021  ?  1:17 PM 07/18/2021  ? 11:29 AM 05/31/2021  ?  4:48 PM 05/16/2021  ? 10:49 AM  ?Depression screen PHQ 2/9  ?Decreased Interest '3 3 3    '$ ?Down, Depressed, Hopeless '3 2 3    '$ ?PHQ - 2 Score '6 5 6    '$ ?Altered sleeping '3 3 3    '$ ?Tired, decreased energy '3 3 3    '$ ?Change in appetite '3 3 3    '$ ?Feeling bad or failure about yourself  '3 2 2    '$ ?Trouble concentrating '2 2 3    '$ ?Moving slowly or fidgety/restless '3 3 1    '$ ?Suicidal thoughts 0 0 0    ?PHQ-9 Score '23 21 21    '$ ?Difficult doing work/chores  Somewhat difficult Very difficult    ?  ? Information is confidential and restricted. Go to Review Flowsheets to unlock data.  ? ? ?  11/15/2021  ?  8:42 AM 08/24/2021  ?  1:19 PM 07/18/2021  ? 11:30 AM 02/10/2021  ?  4:17 PM  ?GAD 7 : Generalized Anxiety Score  ?Nervous, Anxious, on Edge '3 3 3 3  '$ ?Control/stop worrying '3 3 3 3  '$ ?Worry too much - different things '3 3 3 3  '$ ?Trouble relaxing '3 3 3 3  '$ ?Restless '3 3  3 3  '$ ?Easily annoyed or irritable '3 2 3 2  '$ ?Afraid - awful might happen '2 1 3 2  '$ ?Total GAD 7 Score '20 18 21 19  '$ ?Anxiety Difficulty Very difficult Somewhat difficult Very difficult Somewhat difficult  ? ?Anhedonia: no ?Weight changes: no ?Insomnia: yes hard to fall asleep  ?Hypersomnia: no ?Fatigue/loss of energy: yes ?Feelings of worthlessness: yes ?Feelings of guilt: yes ?Impaired concentration/indecisiveness: yes ?Suicidal ideations: no  ?Crying spells: yes ?Recent Stressors/Life Changes: yes ?  Relationship problems: yes ?  Family stress: yes   ?  Financial stress: yes  ?  Job stress: yes  ?  Recent death/loss: yes ? ?Relevant past medical, surgical, family and social history reviewed and updated as indicated. Interim medical history since our last visit reviewed. ?Allergies and medications reviewed and updated. ? ?Review of Systems  ?Constitutional: Negative.   ?Respiratory: Negative.    ?Cardiovascular: Negative.   ?Gastrointestinal: Negative.   ?Musculoskeletal: Negative.   ?Skin: Negative.   ?Neurological: Negative.   ?Psychiatric/Behavioral:  Positive  for agitation, dysphoric mood and sleep disturbance. Negative for behavioral problems, confusion, decreased concentration, hallucinations, self-injury and suicidal ideas. The patient is nervous/anxious. The patient is not hyperactive.   ? ?Per HPI unless specifically indicated above ? ?   ?Objective:  ?  ?BP 121/73   Pulse 78   Temp 98.3 ?F (36.8 ?C)   Wt 167 lb 3.2 oz (75.8 kg)   LMP 06/20/2016 (Approximate)   SpO2 98%   BMI 26.19 kg/m?   ?Wt Readings from Last 3 Encounters:  ?11/15/21 167 lb 3.2 oz (75.8 kg)  ?08/23/21 163 lb 3.2 oz (74 kg)  ?08/18/21 160 lb (72.6 kg)  ?  ?Physical Exam ?Vitals and nursing note reviewed.  ?Constitutional:   ?   General: She is not in acute distress. ?   Appearance: Normal appearance. She is not ill-appearing, toxic-appearing or diaphoretic.  ?HENT:  ?   Head: Normocephalic and atraumatic.  ?   Right Ear: External ear  normal.  ?   Left Ear: External ear normal.  ?   Nose: Nose normal.  ?   Mouth/Throat:  ?   Mouth: Mucous membranes are moist.  ?   Pharynx: Oropharynx is clear.  ?Eyes:  ?   General: No scleral icterus.    ?   Right eye: No discharge.     ?   Left eye: No discharge.  ?   Extraocular Movements: Extraocular movements intact.  ?   Conjunctiva/sclera: Conjunctivae normal.  ?   Pupils: Pupils are equal, round, and reactive to light.  ?Cardiovascular:  ?   Rate and Rhythm: Normal rate and regular rhythm.  ?   Pulses: Normal pulses.  ?   Heart sounds: Normal heart sounds. No murmur heard. ?  No friction rub. No gallop.  ?Pulmonary:  ?   Effort: Pulmonary effort is normal. No respiratory distress.  ?   Breath sounds: Normal breath sounds. No stridor. No wheezing, rhonchi or rales.  ?Chest:  ?   Chest wall: No tenderness.  ?Musculoskeletal:     ?   General: Normal range of motion.  ?   Cervical back: Normal range of motion and neck supple.  ?Skin: ?   General: Skin is warm and dry.  ?   Capillary Refill: Capillary refill takes less than 2 seconds.  ?   Coloration: Skin is not jaundiced or pale.  ?   Findings: No bruising, erythema, lesion or rash.  ?Neurological:  ?   General: No focal deficit present.  ?   Mental Status: She is alert and oriented to person, place, and time. Mental status is at baseline.  ?Psychiatric:     ?   Mood and Affect: Mood normal. Affect is tearful.     ?   Behavior: Behavior normal.     ?   Thought Content: Thought content normal.     ?   Judgment: Judgment normal.  ? ? ?Results for orders placed or performed during the hospital encounter of 07/18/21  ?Synovial cell count + diff, w/ crystals  ?Result Value Ref Range  ? Color, Synovial YELLOW (A) YELLOW  ? Appearance-Synovial CLEAR CLEAR  ? Crystals, Fluid NO CRYSTALS SEEN   ? WBC, Synovial 163 0 - 200 /cu mm  ? Neutrophil, Synovial 14 %  ? Lymphocytes-Synovial Fld 53 %  ? Monocyte-Macrophage-Synovial Fluid 33 %  ? Eosinophils-Synovial 0 %  ? ?    ?Assessment & Plan:  ? ?Problem List Items Addressed This Visit   ? ?  ?  Other  ? GAD (generalized anxiety disorder)  ?  Not doing well. Has not been able to get in with psychiatry locally- will get her into psychiatry in [place]. Appointment scheduled today. Will increase her paxil to '45mg'$  and recheck 1 month. Continue buspar and hydroxyzine. Would like to give 10 of 0.'5mg'$  lorazepam PRN, but patient is on controlled substance agreement with pain management. Call made to see if they are OK with this, they are fine with it as long as it is not long term. Rx for 10 0.'5mg'$  lorazepam sent to her pharmacy. ?  ?  ? Relevant Medications  ? PARoxetine (PAXIL) 30 MG tablet  ? LORazepam (ATIVAN) 0.5 MG tablet  ? Panic attack  ?  See discussion under depression. ?  ?  ? Relevant Medications  ? PARoxetine (PAXIL) 30 MG tablet  ? LORazepam (ATIVAN) 0.5 MG tablet  ? Recurrent major depression-severe ([place]) - Primary  ?  Not doing well. Has not been able to get in with psychiatry locally- will get her into psychiatry in [place]. Appointment scheduled today. Will increase her paxil to '45mg'$  and recheck 1 month. Continue buspar and hydroxyzine. Would like to give 10 of 0.'5mg'$  lorazepam PRN, but patient is on controlled substance agreement with pain management. Call made to see if they are OK with this, they are fine with it as long as it is not long term. Rx for 10 0.'5mg'$  lorazepam sent to her pharmacy. ?  ?  ? Relevant Medications  ? PARoxetine (PAXIL) 30 MG tablet  ? LORazepam (ATIVAN) 0.5 MG tablet  ?  ? ?Follow up plan: ?Return in about 4 weeks (around 12/13/2021). ? ? ? ? ? ?

## 2021-11-15 NOTE — Assessment & Plan Note (Addendum)
Not doing well. Has not been able to get in with psychiatry locally- will get her into psychiatry in [place]. Appointment scheduled today. Will increase her paxil to '45mg'$  and recheck 1 month. Continue buspar and hydroxyzine. Would like to give 10 of 0.'5mg'$  lorazepam PRN, but patient is on controlled substance agreement with pain management. Call made to see if they are OK with this, they are fine with it as long as it is not long term. Rx for 10 0.'5mg'$  lorazepam sent to her pharmacy. ?

## 2021-11-16 DIAGNOSIS — Z9889 Other specified postprocedural states: Secondary | ICD-10-CM | POA: Diagnosis not present

## 2021-11-16 DIAGNOSIS — M25611 Stiffness of right shoulder, not elsewhere classified: Secondary | ICD-10-CM | POA: Diagnosis not present

## 2021-11-16 DIAGNOSIS — M6281 Muscle weakness (generalized): Secondary | ICD-10-CM | POA: Diagnosis not present

## 2021-11-16 DIAGNOSIS — M25511 Pain in right shoulder: Secondary | ICD-10-CM | POA: Diagnosis not present

## 2021-11-21 ENCOUNTER — Other Ambulatory Visit (HOSPITAL_COMMUNITY): Payer: Self-pay

## 2021-11-22 ENCOUNTER — Other Ambulatory Visit: Payer: Self-pay | Admitting: Pharmacist

## 2021-11-22 ENCOUNTER — Ambulatory Visit: Payer: 59 | Admitting: Family Medicine

## 2021-11-22 ENCOUNTER — Other Ambulatory Visit (HOSPITAL_COMMUNITY): Payer: Self-pay

## 2021-11-22 ENCOUNTER — Ambulatory Visit (INDEPENDENT_AMBULATORY_CARE_PROVIDER_SITE_OTHER): Payer: 59 | Admitting: Dermatology

## 2021-11-22 DIAGNOSIS — L308 Other specified dermatitis: Secondary | ICD-10-CM | POA: Diagnosis not present

## 2021-11-22 DIAGNOSIS — Z79899 Other long term (current) drug therapy: Secondary | ICD-10-CM

## 2021-11-22 DIAGNOSIS — L309 Dermatitis, unspecified: Secondary | ICD-10-CM

## 2021-11-22 DIAGNOSIS — L603 Nail dystrophy: Secondary | ICD-10-CM | POA: Diagnosis not present

## 2021-11-22 DIAGNOSIS — L409 Psoriasis, unspecified: Secondary | ICD-10-CM | POA: Diagnosis not present

## 2021-11-22 MED ORDER — CLOBETASOL PROPIONATE 0.05 % EX CREA: TOPICAL_CREAM | CUTANEOUS | 1 refills | Status: DC

## 2021-11-22 MED ORDER — DUPIXENT 300 MG/2ML ~~LOC~~ SOAJ
300.0000 mg | SUBCUTANEOUS | 5 refills | Status: DC
  Filled 2021-11-22 – 2021-11-23 (×2): qty 4, 28d supply, fill #0
  Filled 2021-12-19: qty 4, 28d supply, fill #1
  Filled 2022-01-12: qty 4, 28d supply, fill #2
  Filled 2022-02-13: qty 4, 28d supply, fill #3
  Filled 2022-03-14: qty 4, 28d supply, fill #4
  Filled 2022-04-10: qty 4, 28d supply, fill #5

## 2021-11-22 MED ORDER — DUPIXENT 300 MG/2ML ~~LOC~~ SOAJ
300.0000 mg | SUBCUTANEOUS | 5 refills | Status: DC
  Filled 2021-11-22: qty 4, 28d supply, fill #0

## 2021-11-22 MED ORDER — TACROLIMUS 0.1 % EX OINT: TOPICAL_OINTMENT | Freq: Two times a day (BID) | CUTANEOUS | 1 refills | Status: DC

## 2021-11-22 NOTE — Progress Notes (Signed)
? ?Follow-Up Visit ?  ?Subjective  ?Jamie [name] is a 51 y.o. female who presents for the following: Follow-up (Patient has history of hand dermatitis at bilateral hands. Has been on dupixent , tacrolimus and clobetasol. States hands are staying clear.  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  Would like to discuss methotrexate prescribed by rheumatology and dupixent refills. ).  History of gold and nickle proven allergens.  Has used Cosentyx in past for psoriasis without improvement hands/feet.  Has had history of joint pain, didn't improve with Cosentyx either.  No side effects from [facility], very happy with results.  Pain in hands/feet is gone. ? ? ? ?The following portions of the chart were reviewed this encounter and updated as appropriate:   ?  ? ?Review of Systems: No other skin or systemic complaints except as noted in HPI or Assessment and Plan. ? ? ?Objective  ?Well appearing patient in no apparent distress; mood and affect are within normal limits. ? ?A focused examination was performed including bilateral hands. Relevant physical exam findings are noted in the Assessment and Plan. ? ?bilateral hands ?Mild erythema and xerosis at hands and feet  ? ?bilateral 5th fingernail, left 4th fingernail ?distal onycholysis with white discoloration BL 5th and L 4th fingernails ? ? ?Assessment & Plan  ?Hand dermatitis ?bilateral hands ? ?Chronic condition with duration or expected duration over one year. Currently well-controlled on Dupixent.  ? ?Hand Dermatitis is a chronic type of eczema that can come and go on the hands and fingers.  While there is no cure, the rash and symptoms can be managed with topical prescription medications, and for more severe cases, with systemic medications, such as Dupixent.  Recommend mild soap and routine use of moisturizing cream after handwashing.  Minimize soap/water exposure when possible.   ? 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 ?Will be starting methotrexate for PsA prescribed by rheumatology  ? ? ?For severe Atopic Hand/Foot Dermatitis ?Continue tacrolimus 0.1% ointment twice daily prn.  ?Continue clobetasol 0.05% cream 1-2 times daily prn flares. Avoid applying to face, groin, and axilla. Use as directed. Risk of skin atrophy with long-term use reviewed.  ?Continue Dupixent 300 mg/44m sq - injected 300 mg into the skin every 14 days. [facility] ?   ?  ?Topical steroids (such as triamcinolone, fluocinolone, fluocinonide, mometasone, clobetasol, halobetasol, betamethasone, hydrocortisone) can cause thinning and lightening of the skin if they are used for too long in the same area. Your physician has selected the right strength medicine for your problem and area affected on the body. Please use your medication only as directed by your physician to prevent side effects.  ?  ?Dupilumab (Dupixent) is a treatment given by injection for adults and children with moderate-to-severe atopic dermatitis. Goal is control of skin condition, not cure. It is given as 2 injections at the first dose followed by 1 injection ever 2 weeks thereafter.  Young children are dosed monthly. ?  ?Potential side effects include allergic reaction, herpes infections, injection site reactions and conjunctivitis (inflammation of the eyes).  The use of Dupixent requires long term medication management, including periodic office visits. ? ?tacrolimus (PROTOPIC) 0.1 % ointment - bilateral  hands ?Apply topically 2 (two) times daily. Apply to feet and hands ? ?Related Medications ?Dupilumab (DUPIXENT) 300 MG/2ML SOPN ?Inject 300 mg into the skin every 14 (fourteen) days. ? ?Psoriasis ? ?Related  Medications ?Halobetasol Prop-Tazarotene (DUOBRII) 0.01-0.045 % LOTN ?Apply 1 application topically at bedtime. qhs to aa rash on hands and feet until clear, then prn flares, avoid face, groin, axilla ? ?hydrOXYzine (ATARAX/VISTARIL) 10 MG tablet ?Take 1-3 tablets by mouth every night as needed for itch. ? ?clobetasol cream (TEMOVATE) 0.05 % ?Apply to affected areas 1-2 times a day until rash improved. Avoid face, groin, axilla. ? ?Nail dystrophy ?bilateral 5th fingernail, left 4th fingernail ? 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 ?Will be starting methotrexate rx given by rheumatologist and will monitor ? ? ? ? ? ? ?Return in about 6 months (around 05/24/2022) for hand derm follow up. ?I, Ruthell Rummage, CMA, am acting as scribe for [name], MD. ? ?Documentation: I have reviewed the above documentation for accuracy and completeness, and I agree with the above. ? ?[name] MD  ? ?

## 2021-11-22 NOTE — Patient Instructions (Signed)

## 2021-11-23 ENCOUNTER — Other Ambulatory Visit (HOSPITAL_COMMUNITY): Payer: Self-pay

## 2021-11-28 ENCOUNTER — Other Ambulatory Visit (HOSPITAL_COMMUNITY): Payer: Self-pay

## 2021-11-29 ENCOUNTER — Other Ambulatory Visit (HOSPITAL_COMMUNITY): Payer: Self-pay

## 2021-11-30 ENCOUNTER — Other Ambulatory Visit (HOSPITAL_COMMUNITY): Payer: Self-pay

## 2021-12-13 ENCOUNTER — Encounter (HOSPITAL_COMMUNITY): Payer: Self-pay | Admitting: Psychiatry

## 2021-12-13 ENCOUNTER — Telehealth (HOSPITAL_BASED_OUTPATIENT_CLINIC_OR_DEPARTMENT_OTHER): Payer: 59 | Admitting: Psychiatry

## 2021-12-13 VITALS — Wt 167.0 lb

## 2021-12-13 DIAGNOSIS — F410 Panic disorder [episodic paroxysmal anxiety] without agoraphobia: Secondary | ICD-10-CM

## 2021-12-13 DIAGNOSIS — F411 Generalized anxiety disorder: Secondary | ICD-10-CM | POA: Diagnosis not present

## 2021-12-13 DIAGNOSIS — F3342 Major depressive disorder, recurrent, in full remission: Secondary | ICD-10-CM | POA: Diagnosis not present

## 2021-12-13 MED ORDER — LAMOTRIGINE 25 MG PO TABS: ORAL_TABLET | ORAL | 0 refills | Status: DC
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
## 2021-12-19 ENCOUNTER — Other Ambulatory Visit (HOSPITAL_COMMUNITY): Payer: Self-pay

## 2021-12-20 ENCOUNTER — Other Ambulatory Visit (HOSPITAL_COMMUNITY): Payer: Self-pay

## 2021-12-21 ENCOUNTER — Encounter: Payer: Self-pay | Admitting: Family Medicine

## 2021-12-21 ENCOUNTER — Ambulatory Visit (INDEPENDENT_AMBULATORY_CARE_PROVIDER_SITE_OTHER): Payer: 59 | Admitting: Family Medicine

## 2021-12-21 ENCOUNTER — Other Ambulatory Visit: Payer: Self-pay | Admitting: Family Medicine

## 2021-12-21 VITALS — BP 103/63 | HR 86 | Temp 98.1°F | Wt 165.6 lb

## 2021-12-21 DIAGNOSIS — L609 Nail disorder, unspecified: Secondary | ICD-10-CM

## 2021-12-21 DIAGNOSIS — F411 Generalized anxiety disorder: Secondary | ICD-10-CM | POA: Diagnosis not present

## 2021-12-21 DIAGNOSIS — F332 Major depressive disorder, recurrent severe without psychotic features: Secondary | ICD-10-CM

## 2021-12-21 MED ORDER — FLUTICASONE PROPIONATE 50 MCG/ACT NA SUSP: 1.0000 | Freq: Every day | NASAL | 12 refills | Status: DC | PRN

## 2021-12-21 NOTE — Progress Notes (Signed)
? ?BP 103/63   Pulse 86   Temp 98.1 ?F (36.7 ?C)   Wt 165 lb 9.6 oz (75.1 kg)   LMP 06/20/2016 (Approximate)   SpO2 99%   BMI 25.94 kg/m?   ? ?Subjective:  ? ? Patient ID: Jamie [name], female    DOB: 12-15-1970, 51 y.o.   MRN: [id number] ? ?HPI: ?Jamie [name] is a 51 y.o. female ? ?Chief Complaint  ?Patient presents with  ? Anxiety  ? Depression  ? ?ANXIETY/DEPRESSION- just saw psychiatry. Started on lamictal, working on getting her meds right. Feels like she is falling asleep faster ?Duration: chronic ?Status:uncontrolled ?Anxious mood: yes  ?Excessive worrying: yes ?Irritability: yes  ?Sweating: no ?Nausea: no ?Palpitations:no ?Hyperventilation: no ?Panic attacks: yes ?Agoraphobia: no  ?Obscessions/compulsions: no ?Depressed mood: yes ? ?  12/21/2021  ?  4:10 PM 11/15/2021  ?  8:41 AM 08/24/2021  ?  1:17 PM 07/18/2021  ? 11:29 AM 05/31/2021  ?  4:48 PM  ?Depression screen PHQ 2/9  ?Decreased Interest '2 3 3 3 2  '$ ?Down, Depressed, Hopeless '2 3 2 3 2  '$ ?PHQ - 2 Score '4 6 5 6 4  '$ ?Altered sleeping '3 3 3 3 2  '$ ?Tired, decreased energy '2 3 3 3 2  '$ ?Change in appetite '3 3 3 3 2  '$ ?Feeling bad or failure about yourself  '2 3 2 2 1  '$ ?Trouble concentrating '3 2 2 3 1  '$ ?Moving slowly or fidgety/restless '3 3 3 1 '$ 0  ?Suicidal thoughts 0 0 0 0 0  ?PHQ-9 Score '20 23 21 21 12  '$ ?Difficult doing work/chores   Somewhat difficult Very difficult Not difficult at all  ? ? ?  12/21/2021  ?  4:11 PM 11/15/2021  ?  8:42 AM 08/24/2021  ?  1:19 PM 07/18/2021  ? 11:30 AM  ?GAD 7 : Generalized Anxiety Score  ?Nervous, Anxious, on Edge '3 3 3 3  '$ ?Control/stop worrying '3 3 3 3  '$ ?Worry too much - different things '3 3 3 3  '$ ?Trouble relaxing '3 3 3 3  '$ ?Restless '3 3 3 3  '$ ?Easily annoyed or irritable '3 3 2 3  '$ ?Afraid - awful might happen '2 2 1 3  '$ ?Total GAD 7 Score '20 20 18 21  '$ ?Anxiety Difficulty Somewhat difficult Very difficult Somewhat difficult Very difficult  ? ?Anhedonia: no ?Weight changes: no ?Insomnia: yes hard to stay asleep  ?Hypersomnia:  no ?Fatigue/loss of energy: yes ?Feelings of worthlessness: yes ?Feelings of guilt: yes ?Impaired concentration/indecisiveness: yes ?Suicidal ideations: no  ?Crying spells: yes ?Recent Stressors/Life Changes: yes ?  Relationship problems: yes ?  Family stress: yes   ?  Financial stress: yes  ?  Job stress: yes  ?  Recent death/loss: no ? ?Has been noticing pitting on her nails. Unsure what's causing it.  ? ?Relevant past medical, surgical, family and social history reviewed and updated as indicated. Interim medical history since our last visit reviewed. ?Allergies and medications reviewed and updated. ? ?Review of Systems  ?Constitutional: Negative.   ?Respiratory: Negative.    ?Cardiovascular: Negative.   ?Gastrointestinal: Negative.   ?Musculoskeletal: Negative.   ?Neurological: Negative.   ?Psychiatric/Behavioral:  Positive for decreased concentration, dysphoric mood and sleep disturbance. Negative for agitation, behavioral problems, confusion, hallucinations, self-injury and suicidal ideas. The patient is nervous/anxious. The patient is not hyperactive.   ? ?Per HPI unless specifically indicated above ? ?   ?Objective:  ?  ?BP 103/63   Pulse 86   Temp  98.1 ?F (36.7 ?C)   Wt 165 lb 9.6 oz (75.1 kg)   LMP 06/20/2016 (Approximate)   SpO2 99%   BMI 25.94 kg/m?   ?Wt Readings from Last 3 Encounters:  ?12/21/21 165 lb 9.6 oz (75.1 kg)  ?12/13/21 167 lb (75.8 kg)  ?11/15/21 167 lb 3.2 oz (75.8 kg)  ?  ?Physical Exam ?Vitals and nursing note reviewed.  ?Constitutional:   ?   General: She is not in acute distress. ?   Appearance: Normal appearance. She is not ill-appearing, toxic-appearing or diaphoretic.  ?HENT:  ?   Head: Normocephalic and atraumatic.  ?   Right Ear: External ear normal.  ?   Left Ear: External ear normal.  ?   Nose: Nose normal.  ?   Mouth/Throat:  ?   Mouth: Mucous membranes are moist.  ?   Pharynx: Oropharynx is clear.  ?Eyes:  ?   General: No scleral icterus.    ?   Right eye: No discharge.      ?   Left eye: No discharge.  ?   Extraocular Movements: Extraocular movements intact.  ?   Conjunctiva/sclera: Conjunctivae normal.  ?   Pupils: Pupils are equal, round, and reactive to light.  ?Cardiovascular:  ?   Rate and Rhythm: Normal rate and regular rhythm.  ?   Pulses: Normal pulses.  ?   Heart sounds: Normal heart sounds. No murmur heard. ?  No friction rub. No gallop.  ?Pulmonary:  ?   Effort: Pulmonary effort is normal. No respiratory distress.  ?   Breath sounds: Normal breath sounds. No stridor. No wheezing, rhonchi or rales.  ?Chest:  ?   Chest wall: No tenderness.  ?Musculoskeletal:     ?   General: Normal range of motion.  ?   Cervical back: Normal range of motion and neck supple.  ?Skin: ?   General: Skin is warm and dry.  ?   Capillary Refill: Capillary refill takes less than 2 seconds.  ?   Coloration: Skin is not jaundiced or pale.  ?   Findings: No bruising, erythema, lesion or rash.  ?Neurological:  ?   General: No focal deficit present.  ?   Mental Status: She is alert and oriented to person, place, and time. Mental status is at baseline.  ?Psychiatric:     ?   Mood and Affect: Mood normal.     ?   Behavior: Behavior normal.     ?   Thought Content: Thought content normal.     ?   Judgment: Judgment normal.  ? ? ?Results for orders placed or performed during the hospital encounter of 07/18/21  ?Synovial cell count + diff, w/ crystals  ?Result Value Ref Range  ? Color, Synovial YELLOW (A) YELLOW  ? Appearance-Synovial CLEAR CLEAR  ? Crystals, Fluid NO CRYSTALS SEEN   ? WBC, Synovial 163 0 - 200 /cu mm  ? Neutrophil, Synovial 14 %  ? Lymphocytes-Synovial Fld 53 %  ? Monocyte-Macrophage-Synovial Fluid 33 %  ? Eosinophils-Synovial 0 %  ? ?   ?Assessment & Plan:  ? ?Problem List Items Addressed This Visit   ? ?  ? Other  ? GAD (generalized anxiety disorder)  ?  Doing well with psychiatry. Feeling more hopeful. Continue to monitor. Continue to follow with them. Call with any concerns. ? ?  ?  ?  Recurrent major depression-severe ([place])  ?  Doing well with psychiatry. Feeling more hopeful. Continue to monitor. Continue  to follow with them. Call with any concerns. ? ?  ?  ? ?Other Visit Diagnoses   ? ? Nail abnormalities    -  Primary  ? Will check vitamin levels and start biotin vitamins. Call with any concerns.   ? Relevant Orders  ? B12  ? VITAMIN D 25 Hydroxy (Vit-D Deficiency, Fractures)  ? ?  ?  ? ?Follow up plan: ?Return After 02/10/22 for physical. ? ? ? ? ? ?

## 2021-12-21 NOTE — Assessment & Plan Note (Signed)
Doing well with psychiatry. Feeling more hopeful. Continue to monitor. Continue to follow with them. Call with any concerns. ?

## 2021-12-22 LAB — VITAMIN D 25 HYDROXY (VIT D DEFICIENCY, FRACTURES): Vit D, 25-Hydroxy: 95.5 ng/mL (ref 30.0–100.0)

## 2021-12-22 LAB — VITAMIN B12: Vitamin B-12: 502 pg/mL (ref 232–1245)

## 2022-01-01 DIAGNOSIS — M2550 Pain in unspecified joint: Secondary | ICD-10-CM | POA: Diagnosis not present

## 2022-01-01 DIAGNOSIS — R5382 Chronic fatigue, unspecified: Secondary | ICD-10-CM | POA: Diagnosis not present

## 2022-01-01 DIAGNOSIS — M791 Myalgia, unspecified site: Secondary | ICD-10-CM | POA: Diagnosis not present

## 2022-01-03 ENCOUNTER — Encounter: Payer: Self-pay | Admitting: Dermatology

## 2022-01-03 ENCOUNTER — Telehealth (HOSPITAL_BASED_OUTPATIENT_CLINIC_OR_DEPARTMENT_OTHER): Payer: 59 | Admitting: Psychiatry

## 2022-01-03 ENCOUNTER — Encounter (HOSPITAL_COMMUNITY): Payer: Self-pay | Admitting: Psychiatry

## 2022-01-03 VITALS — Wt 160.0 lb

## 2022-01-03 DIAGNOSIS — F3342 Major depressive disorder, recurrent, in full remission: Secondary | ICD-10-CM | POA: Diagnosis not present

## 2022-01-03 DIAGNOSIS — F410 Panic disorder [episodic paroxysmal anxiety] without agoraphobia: Secondary | ICD-10-CM

## 2022-01-03 DIAGNOSIS — F411 Generalized anxiety disorder: Secondary | ICD-10-CM

## 2022-01-03 MED ORDER — LAMOTRIGINE 25 MG PO TABS: ORAL_TABLET | ORAL | 0 refills | Status: DC

## 2022-01-03 MED ORDER — LORAZEPAM 0.5 MG PO TABS: 0.5000 mg | ORAL_TABLET | Freq: Every day | ORAL | 0 refills | Status: DC | PRN
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
## 2022-01-04 ENCOUNTER — Telehealth (HOSPITAL_COMMUNITY): Payer: Self-pay | Admitting: Psychiatry

## 2022-01-04 NOTE — Telephone Encounter (Signed)
D:  Dr. Adele Schilder referred pt to [place].  A:  Placed call and oriented pt.  Pt states her concern is that she only has about two weeks of FMLA left.  "I told the doctor that."  States she doesn't have short term disability at all.  Patient is requesting to verify her benefits before making a decision.  Patient is also asking about working while attending group.  Case manager will discuss with the treatment team.  Encouraged pt to call the case manager back after she verify her benefits.  Inform Dr. Adele Schilder.  R:  Pt receptive.

## 2022-01-08 ENCOUNTER — Telehealth (HOSPITAL_COMMUNITY): Payer: Self-pay | Admitting: Psychiatry

## 2022-01-08 NOTE — Telephone Encounter (Signed)
D:  Pt phoned and was very tearful.  "I'm trying to figure this FMLA stuff out b/c I am limited on how much I have left."  Pt wanted to know the CPT code for the insurance company.  States she will call them to verify her benefits.  "I just have so much on my plate."  Inquired if pt had a therapist or if she has gone through EAP?  Pt states she hasn't done either.  A:  Provided pt with support.  Await pt to call the case manager back re: starting MH-IOP or not.  Inform Dr. Adele Schilder.

## 2022-01-10 ENCOUNTER — Telehealth (HOSPITAL_COMMUNITY): Payer: Self-pay | Admitting: *Deleted

## 2022-01-10 ENCOUNTER — Telehealth (HOSPITAL_COMMUNITY): Payer: Self-pay | Admitting: Psychiatry

## 2022-01-10 NOTE — Telephone Encounter (Signed)
Pt left VM on nurse line stating that she will not be able to attend IOP due to her FMLA hours. Not just for the group time but also says she has to use some of those hours for taking care of her parents. FYI

## 2022-01-11 ENCOUNTER — Other Ambulatory Visit (HOSPITAL_COMMUNITY): Payer: Self-pay | Admitting: *Deleted

## 2022-01-11 DIAGNOSIS — F410 Panic disorder [episodic paroxysmal anxiety] without agoraphobia: Secondary | ICD-10-CM

## 2022-01-11 MED ORDER — LORAZEPAM 0.5 MG PO TABS: 0.5000 mg | ORAL_TABLET | Freq: Every day | ORAL | 0 refills | Status: DC | PRN

## 2022-01-12 ENCOUNTER — Other Ambulatory Visit (HOSPITAL_COMMUNITY): Payer: Self-pay

## 2022-01-16 DIAGNOSIS — M5412 Radiculopathy, cervical region: Secondary | ICD-10-CM | POA: Diagnosis not present

## 2022-01-17 ENCOUNTER — Encounter: Payer: Self-pay | Admitting: Dermatology

## 2022-01-17 ENCOUNTER — Ambulatory Visit (INDEPENDENT_AMBULATORY_CARE_PROVIDER_SITE_OTHER): Payer: 59 | Admitting: Dermatology

## 2022-01-17 DIAGNOSIS — L603 Nail dystrophy: Secondary | ICD-10-CM | POA: Diagnosis not present

## 2022-01-17 MED ORDER — FLUCONAZOLE 200 MG PO TABS: 200.0000 mg | ORAL_TABLET | Freq: Every day | ORAL | 0 refills | Status: DC

## 2022-01-17 MED ORDER — TAVABOROLE 5 % EX SOLN: CUTANEOUS | 11 refills | Status: AC

## 2022-01-17 NOTE — Patient Instructions (Addendum)
Continue MTX on Sundays as directed by rheumatologist. Discuss increasing dose with rheumatologist. Do not take Folic Acid on same day as MTX.   Start Fluconazole 200 mg take 1 tablet once a week until finished. Take on Wednesdays.   Start Kerydin to affected nails every night at bedtime.  Side effects of fluconazole (diflucan) include nausea, diarrhea, headache, dizziness, taste changes, rare risk of irritation of the liver, allergy, or decreased blood counts (which could show up as infection or tiredness).   If You Need Anything After Your Visit  If you have any questions or concerns for your doctor, please call our main line at [phone] and press option 4 to reach your doctor's medical assistant. If no one answers, please leave a voicemail as directed and we will return your call as soon as possible. Messages left after 4 pm will be answered the following business day.   You may also send [place] a message via [place]. We typically respond to MyChart messages within 1-2 business days.  For prescription refills, please ask your pharmacy to contact our office. Our fax number is [phone].  If you have an urgent issue when the clinic is closed that cannot wait until the next business day, you can page your doctor at the number below.    Please note that while we do our best to be available for urgent issues outside of office hours, we are not available 24/7.   If you have an urgent issue and are unable to reach [place], you may choose to seek medical care at your doctor's office, retail clinic, urgent care center, or emergency room.  If you have a medical emergency, please immediately call 911 or go to the emergency department.  Pager Numbers  - Dr. Nehemiah Massed: [phone]  - Dr. Laurence Ferrari: [phone]  - Dr. Nicole Kindred: [phone]  In the event of inclement weather, please call our main line at [phone] for an update on the status of any delays or closures.  Dermatology Medication  Tips: Please keep the boxes that topical medications come in in order to help keep track of the instructions about where and how to use these. Pharmacies typically print the medication instructions only on the boxes and not directly on the medication tubes.   If your medication is too expensive, please contact our office at [phone] option 4 or send [place] a message through [place].   We are unable to tell what your co-pay for medications will be in advance as this is different depending on your insurance coverage. However, we may be able to find a substitute medication at lower cost or fill out paperwork to get insurance to cover a needed medication.   If a prior authorization is required to get your medication covered by your insurance company, please allow [place] 1-2 business days to complete this process.  Drug prices often vary depending on where the prescription is filled and some pharmacies may offer cheaper prices.  The website www.goodrx.com contains coupons for medications through different pharmacies. The prices here do not account for what the cost may be with help from insurance (it may be cheaper with your insurance), but the website can give you the price if you did not use any insurance.  - You can print the associated coupon and take it with your prescription to the pharmacy.  - You may also stop by our office during regular business hours and pick up a GoodRx coupon card.  - If you need your prescription sent electronically  to a different pharmacy, notify our office through [facility] or by phone at [phone] option 4.     Si Usted Necesita Algo Despus de Su Visita  Tambin puede enviarnos un mensaje a travs de Pharmacist, community. Por lo general respondemos a los mensajes de MyChart en el transcurso de 1 a 2 das hbiles.  Para renovar recetas, por favor pida a su farmacia que se ponga en contacto con nuestra oficina. Harland Dingwall de fax es [place] [phone].  Si tiene un  asunto urgente cuando la clnica est cerrada y que no puede esperar hasta el siguiente da hbil, puede llamar/localizar a su doctor(a) al nmero que aparece a continuacin.   Por favor, tenga en cuenta que aunque hacemos todo lo posible para estar disponibles para asuntos urgentes fuera del horario de [place], no estamos disponibles las 24 horas del da, los 7 das de la [place].   Si tiene un problema urgente y no puede comunicarse con nosotros, puede optar por buscar atencin mdica  en el consultorio de su doctor(a), en una clnica privada, en un centro de atencin urgente o en una sala de emergencias.  Si tiene Engineering geologist, por favor llame inmediatamente al 911 o vaya a la sala de emergencias.  Nmeros de bper  - Dr. Nehemiah Massed: [phone]  - Dra. Moye: [phone]  - Dra. Nicole Kindred: [phone]  En caso de inclemencias del [place], por favor llame a Johnsie Kindred principal al [phone] para una actualizacin sobre el [place] de cualquier retraso o cierre.  Consejos para la medicacin en dermatologa: Por favor, guarde las cajas en las que vienen los medicamentos de uso tpico para ayudarle a seguir las instrucciones sobre dnde y cmo usarlos. Las farmacias generalmente imprimen las instrucciones del medicamento slo en las cajas y no directamente en los tubos del [place].   Si su medicamento es muy caro, por favor, pngase en contacto con Zigmund Daniel llamando al [phone] y presione la opcin 4 o envenos un mensaje a travs de Pharmacist, community.   No podemos decirle cul ser su copago por los medicamentos por adelantado ya que esto es diferente dependiendo de la cobertura de su seguro. Sin embargo, es posible que podamos encontrar un medicamento sustituto a Electrical engineer un formulario para que el seguro cubra el medicamento que se considera necesario.   Si se requiere una autorizacin previa para que su compaa de seguros [place] su medicamento, por favor  permtanos de 1 a 2 das hbiles para completar este proceso.  Los precios de los medicamentos varan con frecuencia dependiendo del Environmental consultant de dnde se surte la receta y alguna farmacias pueden ofrecer precios ms baratos.  El sitio web www.goodrx.com tiene cupones para medicamentos de Airline pilot. Los precios aqu no tienen en cuenta lo que podra costar con la ayuda del seguro (puede ser ms barato con su seguro), pero el sitio web puede darle el precio si no utiliz Research scientist (physical sciences).  - Puede imprimir el cupn correspondiente y llevarlo con su receta a la farmacia.  - Tambin puede pasar por nuestra oficina durante el horario de atencin regular y Charity fundraiser una tarjeta de cupones de GoodRx.  - Si necesita que su receta se enve electrnicamente a una farmacia diferente, informe a nuestra oficina a travs de MyChart de [place] o por telfono llamando al [phone] y presione la opcin 4.

## 2022-01-17 NOTE — Progress Notes (Signed)
   Follow-Up Visit   Subjective  Jamie [name] is a 51 y.o. female who presents for the following: Nail Problem (Fingernails and left great toenail. Dur: ~1-2 months. Has psoriatic arthritis and dermatitis. Is on Dupixent and MTX. Would like to R/O cause of these nail issues).    The following portions of the chart were reviewed this encounter and updated as appropriate:      Review of Systems: No other skin or systemic complaints except as noted in HPI or Assessment and Plan.   Objective  Well appearing patient in no apparent distress; mood and affect are within normal limits.  A focused examination was performed including hands, feet. Relevant physical exam findings are noted in the Assessment and Plan.  R-5, L-5, L-4 fingernails, right great toenail Dystrophic nails with peeling, white discoloration and onycholysis. Nail pitting at B/L thumbs. Partially absent nail plate at Lt great toe              Assessment & Plan  Nail dystrophy R-5, L-5, L-4 fingernails, right great toenail  Onychomycosis vs psoriatic nails vs other.  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 Condition is bothersome/symptomatic for patient. Currently flared.   Reviewed 01/01/22 LFTs, CBC- wnl.   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 Doubtful Dupixent is causing this issue (hand dermatitis is much improved on Dupixent).  MTX may help nail psoriasis. Discussed nail clipping fungal culture today. Patient defers culture since can be low yield (negative doesn't rule out infection), prefers to treat presumptively for fungal infection.  Continue MTX as directed by rheumatologist for PsA   Start Fluconazole 200 mg take 1 tablet once a week until finished for 7-12 months for toenails.  Take on different day than MTX.  Side effects of fluconazole (diflucan) include nausea, diarrhea, headache,  dizziness, taste changes, rare risk of irritation of the liver, allergy, or decreased blood counts (which could show up as infection or tiredness).   Pt gets regular LFTs and CBC labs for MTX monitoring.  Start Kerydin solution to affected nails every night at bedtime.  Tavaborole 5 % SOLN - R-5, L-5, L-4 fingernails, right great toenail Apply qhs to affected toenails  fluconazole (DIFLUCAN) 200 MG tablet - R-5, L-5, L-4 fingernails, right great toenail Take 1 tablet (200 mg total) by mouth daily.   Return for Follow Up As Scheduled.  I, Emelia Salisbury, CMA, am acting as scribe for [name], MD.  Documentation: I have reviewed the above documentation for accuracy and completeness, and I agree with the above.  [name] MD

## 2022-01-18 DIAGNOSIS — M542 Cervicalgia: Secondary | ICD-10-CM | POA: Diagnosis not present

## 2022-01-18 DIAGNOSIS — F1120 Opioid dependence, uncomplicated: Secondary | ICD-10-CM | POA: Diagnosis not present

## 2022-01-18 DIAGNOSIS — M961 Postlaminectomy syndrome, not elsewhere classified: Secondary | ICD-10-CM | POA: Diagnosis not present

## 2022-01-18 DIAGNOSIS — Z6826 Body mass index (BMI) 26.0-26.9, adult: Secondary | ICD-10-CM | POA: Diagnosis not present

## 2022-01-18 DIAGNOSIS — M5416 Radiculopathy, lumbar region: Secondary | ICD-10-CM | POA: Diagnosis not present

## 2022-01-22 ENCOUNTER — Other Ambulatory Visit (HOSPITAL_COMMUNITY): Payer: Self-pay

## 2022-01-29 ENCOUNTER — Other Ambulatory Visit (HOSPITAL_COMMUNITY): Payer: Self-pay | Admitting: Neurosurgery

## 2022-01-29 ENCOUNTER — Other Ambulatory Visit: Payer: Self-pay | Admitting: Neurosurgery

## 2022-01-29 DIAGNOSIS — M5412 Radiculopathy, cervical region: Secondary | ICD-10-CM

## 2022-02-01 ENCOUNTER — Other Ambulatory Visit: Payer: Self-pay

## 2022-02-01 ENCOUNTER — Telehealth (HOSPITAL_COMMUNITY): Payer: Self-pay | Admitting: *Deleted

## 2022-02-01 DIAGNOSIS — F410 Panic disorder [episodic paroxysmal anxiety] without agoraphobia: Secondary | ICD-10-CM

## 2022-02-01 MED ORDER — LORAZEPAM 0.5 MG PO TABS: 0.5000 mg | ORAL_TABLET | Freq: Every day | ORAL | 0 refills | Status: DC | PRN

## 2022-02-01 NOTE — Telephone Encounter (Signed)
She is taking Ativan more than she is supposed to.  It was given for severe panic attacks.  If she required more frequent Ativan and then may need to consider adjusting her dose to Paxil or Lamictal.  I will provide 1 time Ativan 0.5 mg number 10 tablet but it should last until her next appointment.

## 2022-02-01 NOTE — Telephone Encounter (Signed)
Pt called requesting a refill of the Ativan 0.5 mg last scripted on 01/12/22 for #10. Pt has an appointment scheduled for 02/28/22. Please review.

## 2022-02-01 NOTE — Telephone Encounter (Signed)
Writer advised pt that provider will send in a one time script for #10 of the Ativan 0.5 mg. Writer also discussed by think about possibly increasing the Paxil or the Lamictal and this will be discussed at next appointment. Pt verbalizes understanding.

## 2022-02-07 ENCOUNTER — Ambulatory Visit: Payer: Self-pay

## 2022-02-07 NOTE — Telephone Encounter (Signed)
Routing to provider to advise.  

## 2022-02-07 NOTE — Telephone Encounter (Signed)
  Chief Complaint: med request Symptoms: neck pain having MRI Frequency:  Pertinent Negatives: NA Disposition: '[]'$ ED /'[]'$ Urgent Care (no appt availability in office) / '[]'$ Appointment(In office/virtual)/ '[]'$  [place] [facility]/ '[]'$ Home Care/ '[]'$ Refused Recommended Disposition /'[]'$ [place] [facility]/ '[x]'$  Follow-up with PCP Additional Notes: Pt is having MRI on 02/09/22 and requesting Ativan be sent in to [facility]. She doesn't want to use her current prescription for this and then end up running out and not being able to get refill. Advised her I will send to PCP. Pt states Dr. Wynetta Emery has done this in the past rather than Neuro provider sending in.   Summary: discuss medication   Patient states she is having a mri on 6-23 and requesting a rx for lorazepam due to having claustrophobia   Please follow up w/ patient      Reason for Disposition  Prescription request for new medicine (not a refill)  Answer Assessment - Initial Assessment Questions 1. NAME of MEDICATION: "What medicine are you calling about?"     Ativan  2. QUESTION: "What is your question?" (e.g., double dose of medicine, side effect)     Wanting it prescribed for MRI 3. PRESCRIBING HCP: "Who prescribed it?" Reason: if prescribed by specialist, call should be referred to that group.     Dr Wynetta Emery  Protocols used: Medication Question Call-A-AH

## 2022-02-08 ENCOUNTER — Encounter: Payer: Self-pay | Admitting: Family Medicine

## 2022-02-08 NOTE — Telephone Encounter (Signed)
Will need to confirm with both psychiatry and pain medicine that they are OK with her having 2 pills extra for MRI before I can send this in.

## 2022-02-08 NOTE — Telephone Encounter (Signed)
Need to check if it's OK with pain and psych

## 2022-02-09 ENCOUNTER — Ambulatory Visit
Admission: RE | Admit: 2022-02-09 | Discharge: 2022-02-09 | Disposition: A | Discharge status: Home / Self Care | Payer: 59 | Source: Ambulatory Visit | Attending: Neurosurgery | Admitting: Neurosurgery

## 2022-02-09 DIAGNOSIS — M5412 Radiculopathy, cervical region: Secondary | ICD-10-CM | POA: Insufficient documentation

## 2022-02-09 DIAGNOSIS — M542 Cervicalgia: Secondary | ICD-10-CM | POA: Diagnosis not present

## 2022-02-09 NOTE — Telephone Encounter (Signed)
Advised patient we have to reach out to both doctors first before we write rx, patient states not to worry about sending in medication. Patient states she is currently in the ED with her dad and has more things to worry about, she will just take medication of what she has.

## 2022-02-13 ENCOUNTER — Other Ambulatory Visit (HOSPITAL_COMMUNITY): Payer: Self-pay

## 2022-02-13 ENCOUNTER — Other Ambulatory Visit: Payer: Self-pay

## 2022-02-14 ENCOUNTER — Other Ambulatory Visit (HOSPITAL_COMMUNITY): Payer: Self-pay

## 2022-02-16 ENCOUNTER — Other Ambulatory Visit (HOSPITAL_COMMUNITY): Payer: Self-pay

## 2022-02-23 ENCOUNTER — Ambulatory Visit (INDEPENDENT_AMBULATORY_CARE_PROVIDER_SITE_OTHER): Payer: 59 | Admitting: Family Medicine

## 2022-02-23 ENCOUNTER — Encounter: Payer: Self-pay | Admitting: Family Medicine

## 2022-02-23 ENCOUNTER — Ambulatory Visit: Payer: Self-pay | Admitting: *Deleted

## 2022-02-23 VITALS — BP 104/66 | HR 71 | Ht 67.0 in | Wt 167.4 lb

## 2022-02-23 DIAGNOSIS — F411 Generalized anxiety disorder: Secondary | ICD-10-CM | POA: Diagnosis not present

## 2022-02-23 DIAGNOSIS — Z23 Encounter for immunization: Secondary | ICD-10-CM | POA: Diagnosis not present

## 2022-02-23 DIAGNOSIS — F332 Major depressive disorder, recurrent severe without psychotic features: Secondary | ICD-10-CM

## 2022-02-23 DIAGNOSIS — Z1231 Encounter for screening mammogram for malignant neoplasm of breast: Secondary | ICD-10-CM | POA: Diagnosis not present

## 2022-02-23 DIAGNOSIS — Z0000 Encounter for general adult medical examination without abnormal findings: Secondary | ICD-10-CM | POA: Diagnosis not present

## 2022-02-23 DIAGNOSIS — L405 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, unspecified: Secondary | ICD-10-CM | POA: Diagnosis not present

## 2022-02-23 DIAGNOSIS — F410 Panic disorder [episodic paroxysmal anxiety] without agoraphobia: Secondary | ICD-10-CM

## 2022-02-23 LAB — URINALYSIS, ROUTINE W REFLEX MICROSCOPIC
Bilirubin, UA: NEGATIVE
Glucose, UA: NEGATIVE
Ketones, UA: NEGATIVE
Leukocytes,UA: NEGATIVE
Nitrite, UA: NEGATIVE
Protein,UA: NEGATIVE
RBC, UA: NEGATIVE
Specific Gravity, UA: 1.01 (ref 1.005–1.030)
Urobilinogen, Ur: 0.2 mg/dL (ref 0.2–1.0)
pH, UA: 5.5 (ref 5.0–7.5)

## 2022-02-23 MED ORDER — MONTELUKAST SODIUM 10 MG PO TABS: ORAL_TABLET | Freq: Every day | ORAL | 3 refills | Status: DC

## 2022-02-23 MED ORDER — FLUTICASONE PROPIONATE 50 MCG/ACT NA SUSP
1.0000 | Freq: Every day | NASAL | 12 refills | Status: DC | PRN
Start: 2022-02-23 — End: 2023-01-18

## 2022-02-23 NOTE — Assessment & Plan Note (Signed)
Following with psychiatry. Call with any concerns. Continue to monitor.

## 2022-02-23 NOTE — Telephone Encounter (Signed)
Advised patient of provider advise, patient verbally understood and states she has ice on it right now and has taking medication a few hours ago. Patient states she feels shaky but would like to hold off on coming in today. Advised patient if experiences SOB or worsening symptoms to visit UC or ED over the weekend.

## 2022-02-23 NOTE — Telephone Encounter (Signed)
Tylenol/ibuprofen/ice. If not getting better, come in

## 2022-02-23 NOTE — Progress Notes (Signed)
BP 104/66   Pulse 71   Ht '5\' 7"'$  (1.702 m)   Wt 167 lb 6.4 oz (75.9 kg)   LMP 06/20/2016 (Approximate)   SpO2 98%   BMI 26.22 kg/m    Subjective:    Patient ID: Jamie [name], female    DOB: Dec 20, 1970, 51 y.o.   MRN: [id number]  HPI: Jamie [name] is a 51 y.o. female presenting on 02/23/2022 for comprehensive medical examination. Current medical complaints include:none  Menopausal Symptoms: no  Depression Screen done today and results listed below:     02/23/2022    9:04 AM 12/21/2021    4:10 PM 11/15/2021    8:41 AM 08/24/2021    1:17 PM 07/18/2021   11:29 AM  Depression screen PHQ 2/9  Decreased Interest '2 2 3 3 3  '$ Down, Depressed, Hopeless '3 2 3 2 3  '$ PHQ - 2 Score '5 4 6 5 6  '$ Altered sleeping '3 3 3 3 3  '$ Tired, decreased energy '1 2 3 3 3  '$ Change in appetite '3 3 3 3 3  '$ Feeling bad or failure about yourself  '3 2 3 2 2  '$ Trouble concentrating '3 3 2 2 3  '$ Moving slowly or fidgety/restless '3 3 3 3 1  '$ Suicidal thoughts 0 0 0 0 0  PHQ-9 Score '21 20 23 21 21  '$ Difficult doing work/chores Very difficult   Somewhat difficult Very difficult    Past Medical History:  Past Medical History:  Diagnosis Date   Anxiety    takes Ativan daily.  Panic Attack   Arthritis    Chronic back pain    herniated disc/stenosis/scoliosis   Depression    takes Effexor daily   Eczema    uses a cream daily as needed   Family history of adverse reaction to anesthesia    pta dad is very hard to wake up;excessive nausea   History of bronchitis    > 10yrago   Hx of dysplastic nevus 05/10/2020   neck - posterior, Severe Atypia. Shave removal 07/18/2020, margins free   Hypoglycemia    Insomnia    takes Lorazepam nightly   Joint pain    PONV (postoperative nausea and vomiting)    Seasonal allergies    uses Flonase daily   Torn rotator cuff    Vitamin D deficiency    takes Vit D daily   Wears contact lenses     Surgical History:  Past Surgical History:  Procedure Laterality Date   BACK  SURGERY  2008/2012   laminectomy 1st time and 2nd time laminectomy and fusion   COLONOSCOPY WITH PROPOFOL N/A 05/27/2020   Procedure: COLONOSCOPY WITH PROPOFOL;  Surgeon: TVirgel Manifold MD;  Location: [facility];  Service: Gastroenterology;  Laterality: N/A;  COVID POSITIVE AUGUST 16   MAXIMUM ACCESS (MAS)POSTERIOR LUMBAR INTERBODY FUSION (PLIF) 2 LEVEL N/A 09/01/2014   Procedure: Lumbar four-five, Lumbar five-Sacral one Maximum Access Surgery posterior lumbar interbody fusion with interbody prosthesis posterior lateral arthrodesis posterior segmental instrumentation;  Surgeon: GElaina Hoops MD;  [facility];  Service: Neurosurgery;  Laterality: N/A;  Lumbar four-five, Lumbar five-Sacral one Maximum Access Surgery posterior lumbar interbody fusion with i   SHOULDER ARTHROSCOPY WITH SUBACROMIAL DECOMPRESSION AND OPEN ROTATOR C Right 08/18/2021   Procedure: Right shoulder arthroscopic rotator cuff repair, biceps tenodesis, subacromial decompression;  Surgeon: PLeim Fabry MD;  [facility]  Service: Orthopedics;  Laterality: Right;    Medications:  Current Outpatient Medications on File  Prior to Visit  Medication Sig   acetaminophen (TYLENOL) 500 MG tablet Take 2 tablets (1,000 mg total) by mouth every 8 (eight) hours.   Ascorbic Acid (VITAMIN C PO) Take by mouth.   cholecalciferol (VITAMIN D) 1000 UNITS tablet Take 1,000 Units by mouth daily.   clobetasol cream (TEMOVATE) 0.05 % Apply to affected areas 1-2 times a day until rash improved. Avoid face, groin, axilla.   Dupilumab (DUPIXENT) 300 MG/2ML SOPN Inject 300 mg into the skin every 14 (fourteen) days.   fluconazole (DIFLUCAN) 200 MG tablet Take 1 tablet (200 mg total) by mouth daily.   folic acid (FOLVITE) 1 MG tablet Take by mouth.   Halobetasol Prop-Tazarotene (DUOBRII) 0.01-0.045 % LOTN Apply 1 application topically at bedtime. qhs to aa rash on hands and feet until clear, then prn flares, avoid face,  groin, axilla   lamoTRIgine (LAMICTAL) 25 MG tablet Take two - two tab at bed time.   LORazepam (ATIVAN) 0.5 MG tablet Take 1 tablet (0.5 mg total) by mouth daily as needed for anxiety. Do not take within 6 hours of your pain medicine   meloxicam (MOBIC) 7.5 MG tablet Take 7.5 mg by mouth 2 (two) times daily.   metaxalone (SKELAXIN) 800 MG tablet Take by mouth 2 (two) times daily.   methotrexate (RHEUMATREX) 2.5 MG tablet Take 15 mg by mouth once a week.   morphine (KADIAN) 30 MG 24 hr capsule Take 30 mg by mouth daily.    Multiple Vitamin (MULTIVITAMIN ADULT PO) Take by mouth daily.   Multiple Vitamins-Minerals (ZINC PO) Take by mouth.   oxyCODONE (ROXICODONE) 15 MG immediate release tablet Take 15 mg by mouth every 6 (six) hours as needed.    PARoxetine (PAXIL) 30 MG tablet Take 1.5 tablets (45 mg total) by mouth daily.   tacrolimus (PROTOPIC) 0.1 % ointment Apply topically 2 (two) times daily. Apply to feet and hands   Tavaborole 5 % SOLN Apply qhs to affected toenails   gabapentin (NEURONTIN) 600 MG tablet TAKE 1 TABLET BY MOUTH THREE TIMES DAILY   No current facility-administered medications on file prior to visit.    Allergies:  Allergies  Allergen Reactions   Amoxicillin Diarrhea, Nausea And Vomiting and Shortness Of Breath   Shellfish Allergy Hives   Flexeril [Cyclobenzaprine] Hives    RAPID HEARTBEAT    Social History:  Social History   Socioeconomic History   Marital status: Divorced    Spouse name: Not on file   Number of children: Not on file   Years of education: Not on file   Highest education level: Bachelor's degree (e.g., BA, AB, BS)  Occupational History   Not on file  Tobacco Use   Smoking status: Former    Packs/day: 0.50    Years: 20.00    Total pack years: 10.00    Types: Cigarettes    Quit date: 10/18/2016    Years since quitting: 5.3   Smokeless tobacco: Never  Vaping Use   Vaping Use: Never used  Substance and Sexual Activity   Alcohol use: No    Drug use: No   Sexual activity: Not Currently  Other Topics Concern   Not on file  Social History Narrative   Not on file   Social Determinants of Health   Financial Resource Strain: Not on file  Food Insecurity: Not on file  Transportation Needs: Not on file  Physical Activity: Not on file  Stress: Not on file  Social Connections: Not on file  Intimate Partner Violence: Not on file   Social History   Tobacco Use  Smoking Status Former   Packs/day: 0.50   Years: 20.00   Total pack years: 10.00   Types: Cigarettes   Quit date: 10/18/2016   Years since quitting: 5.3  Smokeless Tobacco Never   Social History   Substance and Sexual Activity  Alcohol Use No    Family History:  Family History  Problem Relation Age of Onset   Arthritis Mother    Hyperlipidemia Mother    Migraines Mother    Arthritis Father    Diabetes Father    Heart disease Father    Atrial fibrillation Father    Diabetes Brother    Breast cancer Maternal Grandmother    Mental illness Neg Hx     Past medical history, surgical history, medications, allergies, family history and social history reviewed with patient today and changes made to appropriate areas of the chart.   Review of Systems  Constitutional: Negative.   HENT: Negative.    Eyes: Negative.   Respiratory: Negative.    Cardiovascular: Negative.   Gastrointestinal: Negative.   Genitourinary: Negative.   Musculoskeletal: Negative.   Skin: Negative.   Neurological: Negative.   Endo/Heme/Allergies:  Positive for environmental allergies. Negative for polydipsia. Does not bruise/bleed easily.  Psychiatric/Behavioral:  Positive for depression. Negative for hallucinations, memory loss, substance abuse and suicidal ideas. The patient is nervous/anxious. The patient does not have insomnia.    All other ROS negative except what is listed above and in the HPI.      Objective:    BP 104/66   Pulse 71   Ht '5\' 7"'$  (1.702 m)   Wt 167 lb  6.4 oz (75.9 kg)   LMP 06/20/2016 (Approximate)   SpO2 98%   BMI 26.22 kg/m   Wt Readings from Last 3 Encounters:  02/23/22 167 lb 6.4 oz (75.9 kg)  12/21/21 165 lb 9.6 oz (75.1 kg)  11/15/21 167 lb 3.2 oz (75.8 kg)    Physical Exam Vitals and nursing note reviewed.  Constitutional:      General: She is not in acute distress.    Appearance: Normal appearance. She is normal weight. She is not ill-appearing, toxic-appearing or diaphoretic.  HENT:     Head: Normocephalic and atraumatic.     Right Ear: Tympanic membrane, ear canal and external ear normal. There is no impacted cerumen.     Left Ear: Tympanic membrane, ear canal and external ear normal. There is no impacted cerumen.     Nose: Nose normal. No congestion or rhinorrhea.     Mouth/Throat:     Mouth: Mucous membranes are moist.     Pharynx: Oropharynx is clear. No oropharyngeal exudate or posterior oropharyngeal erythema.  Eyes:     General: No scleral icterus.       Right eye: No discharge.        Left eye: No discharge.     Extraocular Movements: Extraocular movements intact.     Conjunctiva/sclera: Conjunctivae normal.     Pupils: Pupils are equal, round, and reactive to light.  Neck:     Vascular: No carotid bruit.  Cardiovascular:     Rate and Rhythm: Normal rate and regular rhythm.     Pulses: Normal pulses.     Heart sounds: No murmur heard.    No friction rub. No gallop.  Pulmonary:     Effort: Pulmonary effort is normal. No respiratory distress.     Breath  sounds: Normal breath sounds. No stridor. No wheezing, rhonchi or rales.  Chest:     Chest wall: No tenderness.  Abdominal:     General: Abdomen is flat. Bowel sounds are normal. There is no distension.     Palpations: Abdomen is soft. There is no mass.     Tenderness: There is no abdominal tenderness. There is no right CVA tenderness, left CVA tenderness, guarding or rebound.     Hernia: No hernia is present.  Genitourinary:    Comments: Breast and  pelvic exams deferred with shared decision making Musculoskeletal:        General: No swelling, tenderness, deformity or signs of injury.     Cervical back: Normal range of motion and neck supple. No rigidity. No muscular tenderness.     Right lower leg: No edema.     Left lower leg: No edema.  Lymphadenopathy:     Cervical: No cervical adenopathy.  Skin:    General: Skin is warm and dry.     Capillary Refill: Capillary refill takes less than 2 seconds.     Coloration: Skin is not jaundiced or pale.     Findings: No bruising, erythema, lesion or rash.  Neurological:     General: No focal deficit present.     Mental Status: She is alert and oriented to person, place, and time. Mental status is at baseline.     Cranial Nerves: No cranial nerve deficit.     Sensory: No sensory deficit.     Motor: No weakness.     Coordination: Coordination normal.     Gait: Gait normal.     Deep Tendon Reflexes: Reflexes normal.  Psychiatric:        Mood and Affect: Mood normal.        Behavior: Behavior normal.        Thought Content: Thought content normal.        Judgment: Judgment normal.     Results for orders placed or performed in visit on 12/21/21  B12  Result Value Ref Range   Vitamin B-12 502 232 - 1,245 pg/mL  VITAMIN D 25 Hydroxy (Vit-D Deficiency, Fractures)  Result Value Ref Range   Vit D, 25-Hydroxy 95.5 30.0 - 100.0 ng/mL      Assessment & Plan:   Problem List Items Addressed This Visit       Musculoskeletal and Integument   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, unspecified ([place])    Follows with pain management. Call with any concerns. Continue to monitor.       Relevant Medications   methotrexate (RHEUMATREX) 2.5 MG tablet     Other   GAD (generalized anxiety disorder)    Following with psychiatry. Call with any concerns. Continue to monitor.       Panic attack    Following with psychiatry. Call with any concerns. Continue to monitor.       Recurrent major  depression-severe [facility])    Following with psychiatry. Call with any concerns. Continue to monitor.       Other Visit Diagnoses     Routine general medical examination at a health care facility    -  Primary   Vaccines up to date. Screening labs checked today. Pap and colonoscopy up to date. Mammogram ordered. Continue diet and exercise. Call with any concerns.    Relevant Orders   CBC with Differential/Platelet   Comprehensive metabolic panel   Lipid Panel w/o Chol/HDL Ratio   Urinalysis, Routine w reflex microscopic  TSH   Encounter for screening mammogram for malignant neoplasm of breast       Mammogram ordered today.   Relevant Orders   MM 3D SCREEN BREAST BILATERAL        Follow up plan: Return 3 months nurse only for shingrix #2, 1 year physical with me.   LABORATORY TESTING:  - Pap smear: pap done  IMMUNIZATIONS:   - Tdap: Tetanus vaccination status reviewed: last tetanus booster within 10 years. - Influenza: Postponed to flu season - Pneumovax: Up to date - Prevnar: Not applicable - COVID: Up to date - HPV: Not applicable - Shingrix vaccine: Administered today  SCREENING: -Mammogram: Up to date  - Colonoscopy: Up to date   PATIENT COUNSELING:   Advised to take 1 mg of folate supplement per day if capable of pregnancy.   Sexuality: Discussed sexually transmitted diseases, partner selection, use of condoms, avoidance of unintended pregnancy  and contraceptive alternatives.   Advised to avoid cigarette smoking.  I discussed with the patient that most people either abstain from alcohol or drink within safe limits (<=14/week and <=4 drinks/occasion for males, <=7/weeks and <= 3 drinks/occasion for females) and that the risk for alcohol disorders and other health effects rises proportionally with the number of drinks per week and how often a drinker exceeds daily limits.  Discussed cessation/primary prevention of drug use and availability of treatment for abuse.    Diet: Encouraged to adjust caloric intake to maintain  or achieve ideal body weight, to reduce intake of dietary saturated fat and total fat, to limit sodium intake by avoiding high sodium foods and not adding table salt, and to maintain adequate dietary potassium and calcium preferably from fresh fruits, vegetables, and low-fat dairy products.    stressed the importance of regular exercise  Injury prevention: Discussed safety belts, safety helmets, smoke detector, smoking near bedding or upholstery.   Dental health: Discussed importance of regular tooth brushing, flossing, and dental visits.    NEXT PREVENTATIVE PHYSICAL DUE IN 1 YEAR. Return 3 months nurse only for shingrix #2, 1 year physical with me.

## 2022-02-23 NOTE — Assessment & Plan Note (Signed)
Follows with pain management. Call with any concerns. Continue to monitor.

## 2022-02-23 NOTE — Telephone Encounter (Signed)
  Chief Complaint: pain at vaccine site Symptoms: pain- hard to raise arm, slight swelling Frequency: symptoms started 30 minutes after injection Pertinent Negatives: Patient denies fever, redness Disposition: '[]'$ ED /'[]'$ Urgent Care (no appt availability in office) / '[]'$ Appointment(In office/virtual)/ '[]'$  [facility]/ '[x]'$ Home Care/ '[]'$ Refused Recommended Disposition /'[]'$ [facility]/ '[]'$  Follow-up with PCP Additional Notes: Advised per protocol- home care- patient wants to know if there is anything else she can do- she states the pain is severe and it is hard to raise her arm.   Reason for Disposition  Shingles (Herpes zoster; Shingrix) vaccine reactions  Answer Assessment - Initial Assessment Questions 1. SYMPTOMS: "What is the main symptom?" (e.g., redness, swelling, pain)      Painful to move arm,slight swelling at site 2. ONSET: "When was the vaccine (shot) given?" "How much later did the pain begin?" (e.g., hours, days ago)      This am- 9:30, 30 minutes after 3. SEVERITY: "How bad is it?"      Painful to lift arm 4. FEVER: "Is there a fever?" If Yes, ask: "What is it, how was it measured, and when did it start?"      no 5. IMMUNIZATIONS GIVEN: "What shots have you recently received?"     Shingles vaccine 6. PAST REACTIONS: "Have you reacted to immunizations before?" If Yes, ask: "What happened?"     no 7. OTHER SYMPTOMS: "Do you have any other symptoms?"     headache  Protocols used: Immunization Reactions-A-AH

## 2022-02-23 NOTE — Telephone Encounter (Signed)
Routing to provider to advise patient. Shingles vaccine given this morning at visit.

## 2022-02-24 LAB — CBC WITH DIFFERENTIAL/PLATELET
Basophils Absolute: 0 10*3/uL (ref 0.0–0.2)
Basos: 0 %
EOS (ABSOLUTE): 0.1 10*3/uL (ref 0.0–0.4)
Eos: 1 %
Hematocrit: 38.9 % (ref 34.0–46.6)
Hemoglobin: 12.9 g/dL (ref 11.1–15.9)
Immature Grans (Abs): 0 10*3/uL (ref 0.0–0.1)
Immature Granulocytes: 0 %
Lymphocytes Absolute: 3.5 10*3/uL — ABNORMAL HIGH (ref 0.7–3.1)
Lymphs: 45 %
MCH: 33 pg (ref 26.6–33.0)
MCHC: 33.2 g/dL (ref 31.5–35.7)
MCV: 100 fL — ABNORMAL HIGH (ref 79–97)
Monocytes Absolute: 0.5 10*3/uL (ref 0.1–0.9)
Monocytes: 7 %
Neutrophils Absolute: 3.6 10*3/uL (ref 1.4–7.0)
Neutrophils: 47 %
Platelets: 250 10*3/uL (ref 150–450)
RBC: 3.91 x10E6/uL (ref 3.77–5.28)
RDW: 12.8 % (ref 11.7–15.4)
WBC: 7.7 10*3/uL (ref 3.4–10.8)

## 2022-02-24 LAB — LIPID PANEL W/O CHOL/HDL RATIO
Cholesterol, Total: 231 mg/dL — ABNORMAL HIGH (ref 100–199)
HDL: 52 mg/dL (ref >39)
LDL Chol Calc (NIH): 155 mg/dL — ABNORMAL HIGH (ref 0–99)
Triglycerides: 132 mg/dL (ref 0–149)
VLDL Cholesterol Cal: 24 mg/dL (ref 5–40)

## 2022-02-24 LAB — COMPREHENSIVE METABOLIC PANEL
ALT: 14 IU/L (ref 0–32)
AST: 18 IU/L (ref 0–40)
Albumin/Globulin Ratio: 1.9 (ref 1.2–2.2)
Albumin: 4.7 g/dL (ref 3.8–4.9)
Alkaline Phosphatase: 76 IU/L (ref 44–121)
BUN/Creatinine Ratio: 13 (ref 9–23)
BUN: 9 mg/dL (ref 6–24)
Bilirubin Total: 0.2 mg/dL (ref 0.0–1.2)
CO2: 21 mmol/L (ref 20–29)
Calcium: 9.4 mg/dL (ref 8.7–10.2)
Chloride: 100 mmol/L (ref 96–106)
Creatinine, Ser: 0.72 mg/dL (ref 0.57–1.00)
Globulin, Total: 2.5 g/dL (ref 1.5–4.5)
Glucose: 94 mg/dL (ref 70–99)
Potassium: 3.8 mmol/L (ref 3.5–5.2)
Sodium: 138 mmol/L (ref 134–144)
Total Protein: 7.2 g/dL (ref 6.0–8.5)
eGFR: 101 mL/min/{1.73_m2} (ref >59)

## 2022-02-24 LAB — TSH: TSH: 3.74 u[IU]/mL (ref 0.450–4.500)

## 2022-02-26 NOTE — Progress Notes (Signed)
Hi Jamie [name].  Your lab work from last week looks good.  Your cholesterol is elevated.  I recommend following a low fat diet and exercising 5x weekly for at least 30 minutes as you are able to tolerate it.  Continue with your current medication regimen.  Follow up as discussed with Dr. Wynetta Emery.

## 2022-02-28 ENCOUNTER — Telehealth (HOSPITAL_BASED_OUTPATIENT_CLINIC_OR_DEPARTMENT_OTHER): Payer: 59 | Admitting: Psychiatry

## 2022-02-28 ENCOUNTER — Encounter (HOSPITAL_COMMUNITY): Payer: Self-pay | Admitting: Psychiatry

## 2022-02-28 DIAGNOSIS — F411 Generalized anxiety disorder: Secondary | ICD-10-CM | POA: Diagnosis not present

## 2022-02-28 DIAGNOSIS — F410 Panic disorder [episodic paroxysmal anxiety] without agoraphobia: Secondary | ICD-10-CM | POA: Diagnosis not present

## 2022-02-28 DIAGNOSIS — F3342 Major depressive disorder, recurrent, in full remission: Secondary | ICD-10-CM | POA: Diagnosis not present

## 2022-02-28 MED ORDER — PAROXETINE HCL 40 MG PO TABS: 40.0000 mg | ORAL_TABLET | Freq: Every day | ORAL | 0 refills | Status: DC

## 2022-02-28 MED ORDER — LORAZEPAM 0.5 MG PO TABS: 0.5000 mg | ORAL_TABLET | Freq: Every day | ORAL | 0 refills | Status: DC | PRN

## 2022-02-28 MED ORDER — LAMOTRIGINE 100 MG PO TABS: 100.0000 mg | ORAL_TABLET | Freq: Every day | ORAL | 0 refills | Status: DC
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
## 2022-03-07 DIAGNOSIS — M75111 Incomplete rotator cuff tear or rupture of right shoulder, not specified as traumatic: Secondary | ICD-10-CM | POA: Diagnosis not present

## 2022-03-13 ENCOUNTER — Other Ambulatory Visit (HOSPITAL_COMMUNITY): Payer: Self-pay

## 2022-03-14 ENCOUNTER — Other Ambulatory Visit (HOSPITAL_COMMUNITY): Payer: Self-pay

## 2022-03-15 DIAGNOSIS — M961 Postlaminectomy syndrome, not elsewhere classified: Secondary | ICD-10-CM | POA: Diagnosis not present

## 2022-03-15 DIAGNOSIS — Z6826 Body mass index (BMI) 26.0-26.9, adult: Secondary | ICD-10-CM | POA: Diagnosis not present

## 2022-03-15 DIAGNOSIS — M542 Cervicalgia: Secondary | ICD-10-CM | POA: Diagnosis not present

## 2022-03-15 DIAGNOSIS — F1120 Opioid dependence, uncomplicated: Secondary | ICD-10-CM | POA: Diagnosis not present

## 2022-03-16 ENCOUNTER — Other Ambulatory Visit (HOSPITAL_COMMUNITY): Payer: Self-pay

## 2022-03-19 ENCOUNTER — Other Ambulatory Visit (HOSPITAL_COMMUNITY): Payer: Self-pay

## 2022-03-22 ENCOUNTER — Other Ambulatory Visit (HOSPITAL_BASED_OUTPATIENT_CLINIC_OR_DEPARTMENT_OTHER): Payer: Self-pay

## 2022-03-29 ENCOUNTER — Telehealth (HOSPITAL_COMMUNITY): Payer: 59 | Admitting: Psychiatry

## 2022-04-03 DIAGNOSIS — L409 Psoriasis, unspecified: Secondary | ICD-10-CM | POA: Diagnosis not present

## 2022-04-03 DIAGNOSIS — M797 Fibromyalgia: Secondary | ICD-10-CM | POA: Diagnosis not present

## 2022-04-04 ENCOUNTER — Encounter (HOSPITAL_COMMUNITY): Payer: Self-pay | Admitting: Psychiatry

## 2022-04-04 ENCOUNTER — Telehealth (HOSPITAL_BASED_OUTPATIENT_CLINIC_OR_DEPARTMENT_OTHER): Payer: 59 | Admitting: Psychiatry

## 2022-04-04 VITALS — Wt 168.0 lb

## 2022-04-04 DIAGNOSIS — F411 Generalized anxiety disorder: Secondary | ICD-10-CM | POA: Diagnosis not present

## 2022-04-04 DIAGNOSIS — F3342 Major depressive disorder, recurrent, in full remission: Secondary | ICD-10-CM

## 2022-04-04 DIAGNOSIS — F410 Panic disorder [episodic paroxysmal anxiety] without agoraphobia: Secondary | ICD-10-CM

## 2022-04-04 MED ORDER — PAROXETINE HCL 40 MG PO TABS: 40.0000 mg | ORAL_TABLET | Freq: Every day | ORAL | 0 refills | Status: DC

## 2022-04-04 MED ORDER — LAMOTRIGINE 100 MG PO TABS: 100.0000 mg | ORAL_TABLET | Freq: Every day | ORAL | 0 refills | Status: DC

## 2022-04-04 MED ORDER — NORTRIPTYLINE HCL 10 MG PO CAPS: 10.0000 mg | ORAL_CAPSULE | Freq: Every day | ORAL | 0 refills | Status: DC
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
## 2022-04-10 ENCOUNTER — Other Ambulatory Visit (HOSPITAL_COMMUNITY): Payer: Self-pay

## 2022-04-16 ENCOUNTER — Other Ambulatory Visit (HOSPITAL_COMMUNITY): Payer: Self-pay

## 2022-05-01 ENCOUNTER — Telehealth (HOSPITAL_COMMUNITY): Payer: Self-pay | Admitting: *Deleted

## 2022-05-01 DIAGNOSIS — F410 Panic disorder [episodic paroxysmal anxiety] without agoraphobia: Secondary | ICD-10-CM

## 2022-05-01 NOTE — Telephone Encounter (Signed)
Pt called requesting a refill or bridge fill of the Ativan 0.5 mg, last scripted on 02/28/22 for #15. Pt was very anxious and tearful during discussion. Her mother has discovered a lump in her breast, GM dies from breast cancer, and pt will be taking mother for an ultrasound soon. Pt to say that she is taking Pamelor 10 mg at HS and that has improved her sleep. She also says that she is waiting on appointment to see therapist again. Pt's next visit with you is upcoming on 05/15/22. Please review and advise.

## 2022-05-02 MED ORDER — LORAZEPAM 0.5 MG PO TABS: 0.5000 mg | ORAL_TABLET | Freq: Every day | ORAL | 0 refills | Status: DC | PRN

## 2022-05-02 NOTE — Telephone Encounter (Signed)
I will send 10 tablets of Ativan 0.5 mg.  She should not take every day to avoid dependency.  She should go to nortriptyline 20 mg.

## 2022-05-07 ENCOUNTER — Other Ambulatory Visit (HOSPITAL_COMMUNITY): Payer: Self-pay | Admitting: *Deleted

## 2022-05-07 ENCOUNTER — Other Ambulatory Visit: Payer: Self-pay | Admitting: Family Medicine

## 2022-05-07 ENCOUNTER — Other Ambulatory Visit: Payer: Self-pay

## 2022-05-07 DIAGNOSIS — F410 Panic disorder [episodic paroxysmal anxiety] without agoraphobia: Secondary | ICD-10-CM

## 2022-05-07 DIAGNOSIS — F411 Generalized anxiety disorder: Secondary | ICD-10-CM

## 2022-05-07 MED ORDER — PAROXETINE HCL 40 MG PO TABS: 40.0000 mg | ORAL_TABLET | Freq: Every day | ORAL | 0 refills | Status: DC

## 2022-05-08 ENCOUNTER — Other Ambulatory Visit: Payer: Self-pay

## 2022-05-08 NOTE — Telephone Encounter (Signed)
Unable to refill per protocol, request is too soon. Last refill 02/23/22 for 90 and 3 RF.Receipt confirmed by pharmacy (02/23/2022 9:34 AM). Will refuse request.  Requested Prescriptions  Pending Prescriptions Disp Refills  . montelukast (SINGULAIR) 10 MG tablet 90 tablet 3    Sig: TAKE 1 TABLET BY MOUTH AT BEDTIME     Pulmonology:  Leukotriene Inhibitors Passed - 05/07/2022  9:01 AM      Passed - Valid encounter within last 12 months    Recent Outpatient Visits          2 months ago Routine general medical examination at a health care facility   [facility] [place] P, DO   4 months ago Nail abnormalities   [place], Megan P, DO   5 months ago Severe episode of recurrent major depressive disorder, without psychotic features ([place])   [place], Megan P, DO   8 months ago Acute non-recurrent maxillary sinusitis   [place], Megan P, DO   8 months ago Acute cough   [place] Venita Lick, NP      Future Appointments            In 3 weeks [name], MD [place]   In 9 months Wynetta Emery, Barb Merino, DO [facility]

## 2022-05-09 ENCOUNTER — Other Ambulatory Visit: Payer: Self-pay | Admitting: Family Medicine

## 2022-05-09 DIAGNOSIS — Z23 Encounter for immunization: Secondary | ICD-10-CM

## 2022-05-10 ENCOUNTER — Other Ambulatory Visit: Payer: Self-pay | Admitting: Dermatology

## 2022-05-10 ENCOUNTER — Other Ambulatory Visit (HOSPITAL_COMMUNITY): Payer: Self-pay

## 2022-05-10 DIAGNOSIS — L309 Dermatitis, unspecified: Secondary | ICD-10-CM

## 2022-05-10 MED ORDER — DUPIXENT 300 MG/2ML ~~LOC~~ SOAJ
300.0000 mg | SUBCUTANEOUS | 1 refills | Status: DC
  Filled 2022-05-10: qty 4, 28d supply, fill #0

## 2022-05-14 ENCOUNTER — Other Ambulatory Visit (HOSPITAL_COMMUNITY): Payer: Self-pay

## 2022-05-15 ENCOUNTER — Encounter (HOSPITAL_COMMUNITY): Payer: Self-pay | Admitting: Psychiatry

## 2022-05-15 ENCOUNTER — Telehealth (HOSPITAL_BASED_OUTPATIENT_CLINIC_OR_DEPARTMENT_OTHER): Payer: 59 | Admitting: Psychiatry

## 2022-05-15 DIAGNOSIS — F3342 Major depressive disorder, recurrent, in full remission: Secondary | ICD-10-CM | POA: Diagnosis not present

## 2022-05-15 DIAGNOSIS — F411 Generalized anxiety disorder: Secondary | ICD-10-CM | POA: Diagnosis not present

## 2022-05-15 DIAGNOSIS — F410 Panic disorder [episodic paroxysmal anxiety] without agoraphobia: Secondary | ICD-10-CM

## 2022-05-15 MED ORDER — NORTRIPTYLINE HCL 10 MG PO CAPS: 10.0000 mg | ORAL_CAPSULE | Freq: Every day | ORAL | 2 refills | Status: DC

## 2022-05-15 MED ORDER — LAMOTRIGINE 100 MG PO TABS: 100.0000 mg | ORAL_TABLET | Freq: Every day | ORAL | 2 refills | Status: DC

## 2022-05-15 MED ORDER — PAROXETINE HCL 40 MG PO TABS: 40.0000 mg | ORAL_TABLET | Freq: Every day | ORAL | 2 refills | Status: DC
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
## 2022-05-21 ENCOUNTER — Telehealth: Payer: Self-pay | Admitting: Pharmacist

## 2022-05-21 ENCOUNTER — Ambulatory Visit: Payer: 59 | Attending: Family Medicine | Admitting: Pharmacist

## 2022-05-21 DIAGNOSIS — Z79899 Other long term (current) drug therapy: Secondary | ICD-10-CM

## 2022-05-21 NOTE — Telephone Encounter (Signed)
Called patient to schedule an appointment for the [facility] [facility]. I was unable to reach the patient so I left a HIPAA-compliant message requesting that the patient return my call.   Luke Van Ausdall, PharmD, BCACP, CPP Clinical Pharmacist [facility] [phone]  

## 2022-05-21 NOTE — Progress Notes (Signed)
   S: Patient presents for review of their specialty medication therapy.  Patient is currently taking Dupixent for atopic dermatitis. Patient is managed by Dr. Nicole Kindred for this.   Adherence: confirms. Taking 300 mg subq once every 14 days.  Efficacy: tells me that it's working well for her.  Dosing:  300 mg q2weeks  Dose adjustments: Renal: no dose adjustments (has not been studied) Hepatic: no dose adjustments (has not been studied)  Drug-drug interactions: none  Screening: TB test: completed  Monitoring: S/sx of infection: none S/sx of hypersensitivity: none S/sx of ocular effects: none S/sx of eosinophilia/vasculitis: none  Of note, she endorses generalized joint pain but is unable to tell if it's due to the [place]. Will continue to monitor. I encouraged her to look for patterns that may indicate a possible adverse effect.   O:  Lab Results  Component Value Date   WBC 7.7 02/23/2022   HGB 12.9 02/23/2022   HCT 38.9 02/23/2022   MCV 100 (H) 02/23/2022   PLT 250 02/23/2022      Chemistry      Component Value Date/Time   NA 138 02/23/2022 0913   K 3.8 02/23/2022 0913   CL 100 02/23/2022 0913   CO2 21 02/23/2022 0913   BUN 9 02/23/2022 0913   CREATININE 0.72 02/23/2022 0913      Component Value Date/Time   CALCIUM 9.4 02/23/2022 0913   ALKPHOS 76 02/23/2022 0913   AST 18 02/23/2022 0913   ALT 14 02/23/2022 0913   BILITOT 0.2 02/23/2022 0913       A/P: 1. Medication review: Patient currently on [place] for atopic dermatitis. Reviewed the medication with the patient, including the following: Dupixent is a monoclonal antibody used for the treatment of asthma or atopic dermatitis. Patient educated on purpose, proper use and potential adverse effects of Dupixent. Possible adverse effects include increased risk of infection, ocular effects, vasculitis/eosinophilia, and hypersensitivity reactions. Administer as a SubQ injection and rotate sites. Allow the  medication to reach room temp prior to administration (45 mins for 300 mg syringe or 30 min for 200 mg syringe). Do not shake. Discard any unused portion. No recommendations for any changes.   Benard Halsted, PharmD, [facility] [place], [place] [phone]

## 2022-05-22 ENCOUNTER — Ambulatory Visit
Admission: RE | Admit: 2022-05-22 | Discharge: 2022-05-22 | Disposition: A | Discharge status: Home / Self Care | Payer: 59 | Source: Ambulatory Visit | Attending: Family Medicine | Admitting: Family Medicine

## 2022-05-22 DIAGNOSIS — Z1231 Encounter for screening mammogram for malignant neoplasm of breast: Secondary | ICD-10-CM | POA: Diagnosis not present

## 2022-05-24 ENCOUNTER — Other Ambulatory Visit (HOSPITAL_COMMUNITY): Payer: Self-pay

## 2022-05-24 ENCOUNTER — Other Ambulatory Visit: Payer: Self-pay | Admitting: Pharmacist

## 2022-05-24 DIAGNOSIS — L309 Dermatitis, unspecified: Secondary | ICD-10-CM

## 2022-05-24 MED ORDER — DUPIXENT 300 MG/2ML ~~LOC~~ SOAJ
300.0000 mg | SUBCUTANEOUS | 1 refills | Status: DC
  Filled 2022-05-24: qty 4, 28d supply, fill #0

## 2022-05-25 ENCOUNTER — Other Ambulatory Visit (HOSPITAL_COMMUNITY): Payer: Self-pay

## 2022-05-25 DIAGNOSIS — Z760 Encounter for issue of repeat prescription: Secondary | ICD-10-CM | POA: Diagnosis not present

## 2022-05-28 ENCOUNTER — Ambulatory Visit (INDEPENDENT_AMBULATORY_CARE_PROVIDER_SITE_OTHER): Payer: 59

## 2022-05-28 DIAGNOSIS — Z23 Encounter for immunization: Secondary | ICD-10-CM | POA: Diagnosis not present

## 2022-05-29 ENCOUNTER — Other Ambulatory Visit (HOSPITAL_COMMUNITY): Payer: Self-pay

## 2022-05-29 ENCOUNTER — Ambulatory Visit (INDEPENDENT_AMBULATORY_CARE_PROVIDER_SITE_OTHER): Payer: 59 | Admitting: Dermatology

## 2022-05-29 DIAGNOSIS — L309 Dermatitis, unspecified: Secondary | ICD-10-CM

## 2022-05-29 DIAGNOSIS — L603 Nail dystrophy: Secondary | ICD-10-CM

## 2022-05-29 DIAGNOSIS — Z79899 Other long term (current) drug therapy: Secondary | ICD-10-CM

## 2022-05-29 MED ORDER — DUPIXENT 300 MG/2ML ~~LOC~~ SOAJ
300.0000 mg | SUBCUTANEOUS | 5 refills | Status: DC
  Filled 2022-05-29 – 2022-06-07 (×2): qty 4, 28d supply, fill #0

## 2022-05-29 NOTE — Patient Instructions (Addendum)
For Hand and Foot Dermatitis  Continue Dupixent every 14 days  For Flares  Continue tacrolimus 0.1% ointment twice daily as needed.  Continue clobetasol 0.05% cream 1-2 times daily as needed flares. Avoid applying to face, groin, and axilla. Use as directed. Risk of skin atrophy with long-term use reviewed.  Can call [facility] for refills on hold   Topical steroids (such as triamcinolone, fluocinolone, fluocinonide, mometasone, clobetasol, halobetasol, betamethasone, hydrocortisone) can cause thinning and lightening of the skin if they are used for too long in the same area. Your physician has selected the right strength medicine for your problem and area affected on the body. Please use your medication only as directed by your physician to prevent side effects.   For Nails  Continue Kerydin solution to affected nails every night at bedtime.    Due to recent changes in healthcare laws, you may see results of your pathology and/or laboratory studies on MyChart before the doctors have had a chance to review them. We understand that in some cases there may be results that are confusing or concerning to you. Please understand that not all results are received at the same time and often the doctors may need to interpret multiple results in order to provide you with the best plan of care or course of treatment. Therefore, we ask that you please give [place] 2 business days to thoroughly review all your results before contacting the office for clarification. Should we see a critical lab result, you will be contacted sooner.   If You Need Anything After Your Visit  If you have any questions or concerns for your doctor, please call our main line at [phone] and press option 4 to reach your doctor's medical assistant. If no one answers, please leave a voicemail as directed and we will return your call as soon as possible. Messages left after 4 pm will be answered the following business day.    You may also send [place] a message via [place]. We typically respond to MyChart messages within 1-2 business days.  For prescription refills, please ask your pharmacy to contact our office. Our fax number is [phone].  If you have an urgent issue when the clinic is closed that cannot wait until the next business day, you can page your doctor at the number below.    Please note that while we do our best to be available for urgent issues outside of office hours, we are not available 24/7.   If you have an urgent issue and are unable to reach [place], you may choose to seek medical care at your doctor's office, retail clinic, urgent care center, or emergency room.  If you have a medical emergency, please immediately call 911 or go to the emergency department.  Pager Numbers  - Dr. Nehemiah Massed: [phone]  - Dr. Laurence Ferrari: [phone]  - Dr. Nicole Kindred: [phone]  In the event of inclement weather, please call our main line at [phone] for an update on the status of any delays or closures.  Dermatology Medication Tips: Please keep the boxes that topical medications come in in order to help keep track of the instructions about where and how to use these. Pharmacies typically print the medication instructions only on the boxes and not directly on the medication tubes.   If your medication is too expensive, please contact our office at [phone] option 4 or send [place] a message through [place].   We are unable to tell what your co-pay for medications will  be in advance as this is different depending on your insurance coverage. However, we may be able to find a substitute medication at lower cost or fill out paperwork to get insurance to cover a needed medication.   If a prior authorization is required to get your medication covered by your insurance company, please allow [place] 1-2 business days to complete this process.  Drug prices often vary depending on where the prescription is filled and some  pharmacies may offer cheaper prices.  The website www.goodrx.com contains coupons for medications through different pharmacies. The prices here do not account for what the cost may be with help from insurance (it may be cheaper with your insurance), but the website can give you the price if you did not use any insurance.  - You can print the associated coupon and take it with your prescription to the pharmacy.  - You may also stop by our office during regular business hours and pick up a GoodRx coupon card.  - If you need your prescription sent electronically to a different pharmacy, notify our office through [facility] or by phone at [phone] option 4.     Si Usted Necesita Algo Despus de Su Visita  Tambin puede enviarnos un mensaje a travs de Pharmacist, community. Por lo general respondemos a los mensajes de MyChart en el transcurso de 1 a 2 das hbiles.  Para renovar recetas, por favor pida a su farmacia que se ponga en contacto con nuestra oficina. Harland Dingwall de fax es [place] [phone].  Si tiene un asunto urgente cuando la clnica est cerrada y que no puede esperar hasta el siguiente da hbil, puede llamar/localizar a su doctor(a) al nmero que aparece a continuacin.   Por favor, tenga en cuenta que aunque hacemos todo lo posible para estar disponibles para asuntos urgentes fuera del horario de [place], no estamos disponibles las 24 horas del da, los 7 das de la [place].   Si tiene un problema urgente y no puede comunicarse con nosotros, puede optar por buscar atencin mdica  en el consultorio de su doctor(a), en una clnica privada, en un centro de atencin urgente o en una sala de emergencias.  Si tiene Engineering geologist, por favor llame inmediatamente al 911 o vaya a la sala de emergencias.  Nmeros de bper  - Dr. Nehemiah Massed: [phone]  - Dra. Moye: [phone]  - Dra. Nicole Kindred: [phone]  En caso de inclemencias del [place], por favor llame a Johnsie Kindred  principal al [phone] para una actualizacin sobre el [place] de cualquier retraso o cierre.  Consejos para la medicacin en dermatologa: Por favor, guarde las cajas en las que vienen los medicamentos de uso tpico para ayudarle a seguir las instrucciones sobre dnde y cmo usarlos. Las farmacias generalmente imprimen las instrucciones del medicamento slo en las cajas y no directamente en los tubos del [place].   Si su medicamento es muy caro, por favor, pngase en contacto con Zigmund Daniel llamando al [phone] y presione la opcin 4 o envenos un mensaje a travs de Pharmacist, community.   No podemos decirle cul ser su copago por los medicamentos por adelantado ya que esto es diferente dependiendo de la cobertura de su seguro. Sin embargo, es posible que podamos encontrar un medicamento sustituto a Electrical engineer un formulario para que el seguro cubra el medicamento que se considera necesario.   Si se requiere una autorizacin previa para que su compaa de seguros [place] su medicamento, por favor permtanos de 1  a 2 das hbiles para completar este proceso.  Los precios de los medicamentos varan con frecuencia dependiendo del Environmental consultant de dnde se surte la receta y alguna farmacias pueden ofrecer precios ms baratos.  El sitio web www.goodrx.com tiene cupones para medicamentos de Airline pilot. Los precios aqu no tienen en cuenta lo que podra costar con la ayuda del seguro (puede ser ms barato con su seguro), pero el sitio web puede darle el precio si no utiliz Research scientist (physical sciences).  - Puede imprimir el cupn correspondiente y llevarlo con su receta a la farmacia.  - Tambin puede pasar por nuestra oficina durante el horario de atencin regular y Charity fundraiser una tarjeta de cupones de GoodRx.  - Si necesita que su receta se enve electrnicamente a una farmacia diferente, informe a nuestra oficina a travs de MyChart de [place] o por telfono llamando al [phone] y presione la opcin  4.

## 2022-05-29 NOTE — Progress Notes (Signed)
Follow-Up Visit   Subjective  Quinlynn Cuthbert is a 51 y.o. female who presents for the following: Follow-up (Hx of nail dystrophy, hand dermatitis, psoriasis. Patient reports rheumatologist has removed MTX from current meds. She is currently on dupixent for hand dermatitis and much improved. Patients is currently taking kerydin solution for toenails and fingernails. ). Has used Cosentyx in past for psoriasis without improvement hands/feet.  Has had history of joint pain, didn't improve with Cosentyx either, rheumatologist changed to MTX, which didn't help, so it was discontinued as well.  Now she is thought to have fibromyagia.  Joint pain is stilla problem.  No side effects from [place].  She has dry, irritated eyes, but she also has seasonal allergies.  Doesn't think it is any worse.   The following portions of the chart were reviewed this encounter and updated as appropriate:      Review of Systems: No other skin or systemic complaints except as noted in HPI or Assessment and Plan.   Objective  Well appearing patient in no apparent distress; mood and affect are within normal limits.  A focused examination was performed including b/l feet, b/l hands, b/l fingernails and toenails. . Relevant physical exam findings are noted in the Assessment and Plan.  R-5, L-5, L-4 fingernails, right great toenail Nails improving , No obvious dystrophy on fingernails, R great toenail with distal thickening  bilateral hands and feet Mild erythema and scale on palms, few scattered microvesicles, plantar feet with mild erythema and scale    Assessment & Plan  Nail dystrophy R-5, L-5, L-4 fingernails, right great toenail  Improving, doubt tinea unguium   Patient has Hand Dermatitis (on Dupixent), nails are much improved.  Nail dystrophy was probably was due to her severe hand dermatitis and has improved since her hand dermatitis has been controlled with Dupixent.   MTX has been discontinued by  rheumatologist, who thinks she doesn't have PsA, but instead probably has fibromyalgia  Discussed that great toenail can take a year to grow out.  Related Medications Tavaborole 5 % SOLN Apply qhs to affected toenails  fluconazole (DIFLUCAN) 200 MG tablet Take 1 tablet (200 mg total) by mouth daily.  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 Currently well-controlled on Dupixent.    Hand Dermatitis is a chronic type of eczema that can come and go on the hands and fingers.  While there is no cure, the rash and symptoms can be managed with topical prescription medications, and for more severe cases, with systemic medications, such as Dupixent.  Recommend mild soap and routine use of moisturizing cream after handwashing.  Minimize soap/water exposure when possible.  Atopic dermatitis - Severe, on Dupixent (biologic medication).  Atopic dermatitis (eczema) is a chronic, relapsing, pruritic condition that can significantly affect quality of life. It is often associated with allergic rhinitis and/or asthma and can require treatment with topical medications, phototherapy, or in severe cases a biologic medication called Dupixent, which requires long term medication management.       History of gold and nickle proven contact allergens on TRUE test   Continue tacrolimus 0.1% ointment twice daily prn.   Continue clobetasol 0.05% cream 1-2 times daily prn flares. Avoid applying to face, groin, and axilla. Use as directed. Risk of skin atrophy with long-term use reviewed.   Continue Dupixent 300 mg/39m sq - injected 300 mg into the skin every 14 days. Refills sent to [facility]armacy     Continue allergy treatments (antihistamines/Rx eyedrops) for eye symptoms, she has upcoming eye appointment.  Keep eyes lubricated with normal saline eye drops  Topical steroids (such as triamcinolone, fluocinolone, fluocinonide,  mometasone, clobetasol, halobetasol, betamethasone, hydrocortisone) can cause thinning and lightening of the skin if they are used for too long in the same area. Your physician has selected the right strength medicine for your problem and area affected on the body. Please use your medication only as directed by your physician to prevent side effects.    Dupilumab (Dupixent) is a treatment given by injection for adults and children with moderate-to-severe atopic dermatitis. Goal is control of skin condition, not cure. It is given as 2 injections at the first dose followed by 1 injection ever 2 weeks thereafter.  Young children are dosed monthly.   Potential side effects include allergic reaction, herpes infections, injection site reactions and conjunctivitis (inflammation of the eyes).  The use of Dupixent requires long term medication management, including periodic office visits.    Related Medications tacrolimus (PROTOPIC) 0.1 % ointment Apply topically 2 (two) times daily. Apply to feet and hands  Dupilumab (DUPIXENT) 300 MG/2ML SOPN Inject 300 mg into the skin every 14 (fourteen) days.   Return for   6 month hand derm and nail dystrophy follow up.  I, Ruthell Rummage, CMA, am acting as scribe for [name], MD.  Documentation: I have reviewed the above documentation for accuracy and completeness, and I agree with the above.  [name] MD

## 2022-06-05 ENCOUNTER — Other Ambulatory Visit (HOSPITAL_COMMUNITY): Payer: Self-pay

## 2022-06-05 ENCOUNTER — Other Ambulatory Visit (HOSPITAL_COMMUNITY): Payer: Self-pay | Admitting: *Deleted

## 2022-06-05 DIAGNOSIS — F410 Panic disorder [episodic paroxysmal anxiety] without agoraphobia: Secondary | ICD-10-CM

## 2022-06-05 MED ORDER — LORAZEPAM 0.5 MG PO TABS: 0.5000 mg | ORAL_TABLET | Freq: Every day | ORAL | 0 refills | Status: DC | PRN

## 2022-06-07 ENCOUNTER — Other Ambulatory Visit (HOSPITAL_COMMUNITY): Payer: Self-pay

## 2022-06-07 DIAGNOSIS — F1120 Opioid dependence, uncomplicated: Secondary | ICD-10-CM | POA: Diagnosis not present

## 2022-06-07 DIAGNOSIS — M961 Postlaminectomy syndrome, not elsewhere classified: Secondary | ICD-10-CM | POA: Diagnosis not present

## 2022-06-07 DIAGNOSIS — Z6825 Body mass index (BMI) 25.0-25.9, adult: Secondary | ICD-10-CM | POA: Diagnosis not present

## 2022-06-07 DIAGNOSIS — M542 Cervicalgia: Secondary | ICD-10-CM | POA: Diagnosis not present

## 2022-06-07 DIAGNOSIS — M5416 Radiculopathy, lumbar region: Secondary | ICD-10-CM | POA: Diagnosis not present

## 2022-06-11 ENCOUNTER — Other Ambulatory Visit (HOSPITAL_COMMUNITY): Payer: Self-pay

## 2022-06-18 ENCOUNTER — Other Ambulatory Visit: Payer: Self-pay

## 2022-06-19 ENCOUNTER — Other Ambulatory Visit (HOSPITAL_COMMUNITY): Payer: Self-pay

## 2022-06-19 ENCOUNTER — Telehealth (HOSPITAL_COMMUNITY): Payer: Self-pay | Admitting: *Deleted

## 2022-06-19 ENCOUNTER — Other Ambulatory Visit: Payer: Self-pay | Admitting: Pharmacist

## 2022-06-19 DIAGNOSIS — L309 Dermatitis, unspecified: Secondary | ICD-10-CM

## 2022-06-19 MED ORDER — DUPIXENT 300 MG/2ML ~~LOC~~ SOAJ
300.0000 mg | SUBCUTANEOUS | 5 refills | Status: DC
  Filled 2022-06-19 – 2022-07-03 (×2): qty 4, 28d supply, fill #0
  Filled 2022-08-01: qty 4, 28d supply, fill #1
  Filled 2022-08-30: qty 4, 28d supply, fill #2
  Filled 2022-09-26: qty 4, 28d supply, fill #3
  Filled 2022-10-24: qty 4, 28d supply, fill #4
  Filled 2022-11-20: qty 4, 28d supply, fill #5

## 2022-06-19 NOTE — Telephone Encounter (Signed)
Writer received FMLA paperwork from [employer]. I didn't see any mention of FMLA in your notes except in relation to pt surgery. Ok to proceed? Pt next appointment is not until 08/06/22. Please review and advise.

## 2022-06-20 NOTE — Telephone Encounter (Signed)
Maximum two days per month.

## 2022-06-20 NOTE — Telephone Encounter (Signed)
Got it!     Thanks =).

## 2022-06-20 NOTE — Telephone Encounter (Signed)
Yes she did mention about her FMLA related to upcoming surgeries.  Please check with the patient before filling up FMLA papers.  Thank you

## 2022-06-20 NOTE — Telephone Encounter (Signed)
She is asking for FMLA r/t panic attacks. How many days per month are you allowing?

## 2022-06-22 ENCOUNTER — Ambulatory Visit
Admission: RE | Admit: 2022-06-22 | Discharge: 2022-06-22 | Disposition: A | Discharge status: Home / Self Care | Payer: 59 | Source: Ambulatory Visit | Attending: Emergency Medicine | Admitting: Emergency Medicine

## 2022-06-22 ENCOUNTER — Other Ambulatory Visit (HOSPITAL_COMMUNITY): Payer: Self-pay

## 2022-06-22 VITALS — BP 119/70 | HR 98 | Temp 98.2°F | Resp 18 | Ht 67.0 in | Wt 160.0 lb

## 2022-06-22 DIAGNOSIS — R52 Pain, unspecified: Secondary | ICD-10-CM | POA: Diagnosis not present

## 2022-06-22 DIAGNOSIS — R0981 Nasal congestion: Secondary | ICD-10-CM | POA: Diagnosis present

## 2022-06-22 DIAGNOSIS — Z1152 Encounter for screening for COVID-19: Secondary | ICD-10-CM | POA: Insufficient documentation

## 2022-06-22 DIAGNOSIS — B349 Viral infection, unspecified: Secondary | ICD-10-CM | POA: Diagnosis not present

## 2022-06-22 DIAGNOSIS — R051 Acute cough: Secondary | ICD-10-CM | POA: Diagnosis not present

## 2022-06-22 LAB — RESP PANEL BY RT-PCR (FLU A&B, COVID) ARPGX2
Influenza A by PCR: NEGATIVE
Influenza B by PCR: NEGATIVE
SARS Coronavirus 2 by RT PCR: NEGATIVE

## 2022-06-22 MED ORDER — PROMETHAZINE-DM 6.25-15 MG/5ML PO SYRP: 5.0000 mL | ORAL_SOLUTION | Freq: Four times a day (QID) | ORAL | 0 refills | Status: DC | PRN

## 2022-06-22 NOTE — Discharge Instructions (Addendum)
Your COVID and Flu test is pending.    Take the promethazine DM as directed for cough.  Do not drive, operate machinery, drink alcohol, or perform dangerous activities while taking this medication as it may cause drowsiness.  Take Tylenol or ibuprofen as needed for fever or discomfort.  Rest and keep yourself hydrated.    Follow-up with your primary care provider if your symptoms are not improving.

## 2022-06-22 NOTE — ED Triage Notes (Signed)
Patient to Urgent Care with complaints of nasal congestion, body aches, headaches, and nausea. Symptoms started on Wednesday.   Possible fever yesterday.

## 2022-06-22 NOTE — ED Provider Notes (Signed)
[facility]    CSN: [id number] Arrival date & time: 06/22/22  1350      History   Chief Complaint Chief Complaint  Patient presents with   Nasal Congestion    Feeling Fluish - body aches + congestion + headache + nausea etc. - Entered by patient    HPI Jamie [name] is a 51 y.o. female.  Patient presents with 2-day history of ear pain, sore throat, congestion, cough, headache, body aches, nausea.  No fever, chest pain, shortness of breath, vomiting, diarrhea, or other symptoms.  No OTC medications taken today.    The history is provided by the patient and medical records.    Past Medical History:  Diagnosis Date   Anxiety    takes Ativan daily.  Panic Attack   Arthritis    Chronic back pain    herniated disc/stenosis/scoliosis   Depression    takes Effexor daily   Eczema    uses a cream daily as needed   Family history of adverse reaction to anesthesia    pta dad is very hard to wake up;excessive nausea   History of bronchitis    > 56yrago   Hx of dysplastic nevus 05/10/2020   neck - posterior, Severe Atypia. Shave removal 07/18/2020, margins free   Hypoglycemia    Insomnia    takes Lorazepam nightly   Joint pain    PONV (postoperative nausea and vomiting)    Seasonal allergies    uses Flonase daily   Torn rotator cuff    Vitamin D deficiency    takes Vit D daily   Wears contact lenses     Patient Active Problem List   Diagnosis Date Noted   Generalized body aches 06/22/2022   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, unspecified ([place] 02/23/2022   Recurrent major depression-severe ([place] 10/19/2020   GAD (generalized anxiety disorder) 04/26/2020   Panic attack 04/26/2020   At risk for long QT syndrome 04/26/2020   DDD (degenerative disc disease), lumbar 05/12/2019   Allergic rhinitis 05/11/2019   Primary osteoarthritis of right knee 03/20/2018   Insomnia 08/24/2017   Chronic back pain 10/16/2016   Pseudoarthrosis of lumbar spine 08/11/2015   Eczema  05/09/2015   Spinal stenosis of lumbar region 09/01/2014   Spondylolisthesis 08/03/2014   Displacement of lumbar intervertebral disc without myelopathy 08/03/2014    Past Surgical History:  Procedure Laterality Date   BACK SURGERY  2008/2012   laminectomy 1st time and 2nd time laminectomy and fusion   COLONOSCOPY WITH PROPOFOL N/A 05/27/2020   Procedure: COLONOSCOPY WITH PROPOFOL;  Surgeon: TVirgel Manifold MD;  Location: [facility];  Service: Gastroenterology;  Laterality: N/A;  COVID POSITIVE AUGUST 16   MAXIMUM ACCESS (MAS)POSTERIOR LUMBAR INTERBODY FUSION (PLIF) 2 LEVEL N/A 09/01/2014   Procedure: Lumbar four-five, Lumbar five-Sacral one Maximum Access Surgery posterior lumbar interbody fusion with interbody prosthesis posterior lateral arthrodesis posterior segmental instrumentation;  Surgeon: GElaina Hoops MD;  [facility];  Service: Neurosurgery;  Laterality: N/A;  Lumbar four-five, Lumbar five-Sacral one Maximum Access Surgery posterior lumbar interbody fusion with i   SHOULDER ARTHROSCOPY WITH SUBACROMIAL DECOMPRESSION AND OPEN ROTATOR C Right 08/18/2021   Procedure: Right shoulder arthroscopic rotator cuff repair, biceps tenodesis, subacromial decompression;  Surgeon: PLeim Fabry MD;  [place] [facility]  Service: Orthopedics;  Laterality: Right;    OB History   No obstetric history on file.      Home Medications    Prior to Admission medications   Medication  Sig Start Date End Date Taking? Authorizing Provider  promethazine-dextromethorphan (PROMETHAZINE-DM) 6.25-15 MG/5ML syrup Take 5 mLs by mouth 4 (four) times daily as needed for cough. 06/22/22  Yes Sharion Balloon, NP  acetaminophen (TYLENOL) 500 MG tablet Take 2 tablets (1,000 mg total) by mouth every 8 (eight) hours. 08/18/21 08/18/22  Leim Fabry, MD  Ascorbic Acid (VITAMIN C PO) Take by mouth.    [provider]  cholecalciferol (VITAMIN D) 1000 UNITS tablet Take 1,000 Units by  mouth daily.    [provider]  clobetasol cream (TEMOVATE) 0.05 % Apply to affected areas 1-2 times a day until rash improved. Avoid face, groin, axilla. 11/22/21   [name], MD  Dupilumab (DUPIXENT) 300 MG/2ML SOPN Inject 300 mg into the skin every 14 (fourteen) days. 06/19/22   Tresa Garter, MD  fluconazole (DIFLUCAN) 200 MG tablet Take 1 tablet (200 mg total) by mouth daily. 01/17/22   [name], MD  fluticasone [facility]) 50 MCG/ACT nasal spray Place 1 spray into both nostrils daily as needed for allergies. 02/23/22   Park Liter P, DO  folic acid (FOLVITE) 1 MG tablet Take by mouth. 11/14/21 11/09/22  [provider]  gabapentin (NEURONTIN) 600 MG tablet TAKE 1 TABLET BY MOUTH THREE TIMES DAILY 01/02/21 01/02/22    Halobetasol Prop-Tazarotene (DUOBRII) 0.01-0.045 % LOTN Apply 1 application topically at bedtime. qhs to aa rash on hands and feet until clear, then prn flares, avoid face, groin, axilla 03/08/21   [name], MD  lamoTRIgine (LAMICTAL) 100 MG tablet Take 1 tablet (100 mg total) by mouth daily. 05/15/22   Arfeen, Arlyce Harman, MD  LORazepam (ATIVAN) 0.5 MG tablet Take 1 tablet (0.5 mg total) by mouth daily as needed for anxiety. Do not take within 6 hours of your pain medicine 06/05/22   Arfeen, Arlyce Harman, MD  meloxicam (MOBIC) 7.5 MG tablet Take 7.5 mg by mouth 2 (two) times daily. 10/26/21   [provider]  metaxalone (SKELAXIN) 800 MG tablet Take by mouth 2 (two) times daily. 12/15/21   [provider]  methotrexate (RHEUMATREX) 2.5 MG tablet Take 15 mg by mouth once a week. 02/15/22   [provider]  montelukast (SINGULAIR) 10 MG tablet TAKE 1 TABLET BY MOUTH AT BEDTIME 02/23/22 02/23/23  Johnson, Megan P, DO  morphine (KADIAN) 30 MG 24 hr capsule Take 30 mg by mouth daily.     [provider]  Multiple Vitamin (MULTIVITAMIN ADULT PO) Take by mouth daily.    [provider]  Multiple Vitamins-Minerals (ZINC PO) Take by  mouth.    [provider]  nortriptyline (PAMELOR) 10 MG capsule Take 1-2 capsules (10-20 mg total) by mouth at bedtime. 05/15/22 05/15/23  Arfeen, Arlyce Harman, MD  oxyCODONE (ROXICODONE) 15 MG immediate release tablet Take 15 mg by mouth every 6 (six) hours as needed.  03/09/16   [provider]  PARoxetine (PAXIL) 40 MG tablet Take 1 tablet (40 mg total) by mouth daily. 05/15/22   Arfeen, Arlyce Harman, MD  tacrolimus (PROTOPIC) 0.1 % ointment Apply topically 2 (two) times daily. Apply to feet and hands 11/22/21   [name], MD  Tavaborole 5 % SOLN Apply qhs to affected toenails 01/17/22   [name], MD    Family History Family History  Problem Relation Age of Onset   Arthritis Mother    Hyperlipidemia Mother    Migraines Mother    Arthritis Father    Diabetes Father    Heart  disease Father    Atrial fibrillation Father    Diabetes Brother    Breast cancer Maternal Grandmother    Mental illness Neg Hx     Social History Social History   Tobacco Use   Smoking status: Former    Packs/day: 0.50    Years: 20.00    Total pack years: 10.00    Types: Cigarettes    Quit date: 10/18/2016    Years since quitting: 5.6   Smokeless tobacco: Never  Vaping Use   Vaping Use: Never used  Substance Use Topics   Alcohol use: No   Drug use: No     Allergies   Amoxicillin, Shellfish allergy, and Flexeril [cyclobenzaprine]   Review of Systems Review of Systems  Constitutional:  Negative for chills and fever.  HENT:  Positive for congestion, ear pain and sore throat.   Respiratory:  Positive for cough. Negative for shortness of breath.   Cardiovascular:  Negative for chest pain and palpitations.  Gastrointestinal:  Positive for nausea. Negative for abdominal pain, diarrhea and vomiting.  Skin:  Negative for rash.  Neurological:  Positive for headaches.  All other systems reviewed and are negative.    Physical Exam Triage Vital Signs ED Triage Vitals  Enc Vitals Group      BP 06/22/22 1413 119/70     Pulse Rate 06/22/22 1404 98     Resp 06/22/22 1404 18     Temp 06/22/22 1404 98.2 F (36.8 C)     Temp src --      SpO2 06/22/22 1404 98 %     Weight 06/22/22 1408 160 lb (72.6 kg)     Height 06/22/22 1408 '5\' 7"'$  (1.702 m)     Head Circumference --      Peak Flow --      Pain Score 06/22/22 1408 5     Pain Loc --      Pain Edu? --      Excl. in [place]? --    No data found.  Updated Vital Signs BP 119/70   Pulse 98   Temp 98.2 F (36.8 C)   Resp 18   Ht '5\' 7"'$  (1.702 m)   Wt 160 lb (72.6 kg)   LMP 06/20/2016 (Approximate)   SpO2 98%   BMI 25.06 kg/m   Visual Acuity Right Eye Distance:   Left Eye Distance:   Bilateral Distance:    Right Eye Near:   Left Eye Near:    Bilateral Near:     Physical Exam Vitals and nursing note reviewed.  Constitutional:      General: She is not in acute distress.    Appearance: Normal appearance. She is well-developed. She is not ill-appearing.  HENT:     Right Ear: Tympanic membrane normal.     Left Ear: Tympanic membrane normal.     Nose: Rhinorrhea present.     Mouth/Throat:     Mouth: Mucous membranes are moist.     Pharynx: Oropharynx is clear.     Comments: Clear PND. Cardiovascular:     Rate and Rhythm: Normal rate and regular rhythm.     Heart sounds: Normal heart sounds.  Pulmonary:     Effort: Pulmonary effort is normal. No respiratory distress.     Breath sounds: Normal breath sounds.  Musculoskeletal:     Cervical back: Neck supple.  Skin:    General: Skin is warm and dry.  Neurological:     Mental Status: She is alert.  Psychiatric:        Mood and Affect: Mood normal.        Behavior: Behavior normal.      UC Treatments / Results  Labs (all labs ordered are listed, but only abnormal results are displayed) Labs Reviewed  RESP PANEL BY RT-PCR (FLU A&B, COVID) [id number]    EKG   Radiology No results found.  Procedures Procedures (including critical care  time)  Medications Ordered in UC Medications - No data to display  Initial Impression / Assessment and Plan / UC Course  I have reviewed the triage vital signs and the nursing notes.  Pertinent labs & imaging results that were available during my care of the patient were reviewed by me and considered in my medical decision making (see chart for details).   Body aches, Viral illness, Cough.  COVID and Flu pending.  Treating cough with Promethazine DM; precautions for drowsiness with this medication discussed.  Discussed symptomatic treatment including Tylenol or ibuprofen, rest, hydration.  Instructed patient to follow up with her PCP if symptoms are not improving.  She agrees to plan of care.    Final Clinical Impressions(s) / UC Diagnoses   Final diagnoses:  Generalized body aches  Viral illness  Acute cough     Discharge Instructions      Your COVID and Flu test is pending.    Take the promethazine DM as directed for cough.  Do not drive, operate machinery, drink alcohol, or perform dangerous activities while taking this medication as it may cause drowsiness.  Take Tylenol or ibuprofen as needed for fever or discomfort.  Rest and keep yourself hydrated.    Follow-up with your primary care provider if your symptoms are not improving.         ED Prescriptions     Medication Sig Dispense Auth. Provider   promethazine-dextromethorphan (PROMETHAZINE-DM) 6.25-15 MG/5ML syrup Take 5 mLs by mouth 4 (four) times daily as needed for cough. 118 mL Sharion Balloon, NP      PDMP not reviewed this encounter.   Sharion Balloon, NP 06/22/22 1433

## 2022-06-25 NOTE — Telephone Encounter (Signed)
FMLA forms completed and faxed to [employer].

## 2022-07-03 ENCOUNTER — Other Ambulatory Visit (HOSPITAL_COMMUNITY): Payer: Self-pay

## 2022-07-09 ENCOUNTER — Other Ambulatory Visit (HOSPITAL_COMMUNITY): Payer: Self-pay

## 2022-07-23 ENCOUNTER — Other Ambulatory Visit: Payer: Self-pay

## 2022-07-25 DIAGNOSIS — H5213 Myopia, bilateral: Secondary | ICD-10-CM | POA: Diagnosis not present

## 2022-07-30 ENCOUNTER — Encounter: Payer: 59 | Admitting: Dermatology

## 2022-07-31 ENCOUNTER — Ambulatory Visit (INDEPENDENT_AMBULATORY_CARE_PROVIDER_SITE_OTHER): Payer: 59 | Admitting: Neurosurgery

## 2022-07-31 ENCOUNTER — Encounter: Payer: 59 | Admitting: Dermatology

## 2022-07-31 ENCOUNTER — Encounter: Payer: Self-pay | Admitting: Neurosurgery

## 2022-07-31 VITALS — BP 139/79 | HR 117 | Temp 98.4°F | Wt 168.6 lb

## 2022-07-31 DIAGNOSIS — M25552 Pain in left hip: Secondary | ICD-10-CM | POA: Diagnosis not present

## 2022-07-31 DIAGNOSIS — G8929 Other chronic pain: Secondary | ICD-10-CM | POA: Diagnosis not present

## 2022-07-31 DIAGNOSIS — M5442 Lumbago with sciatica, left side: Secondary | ICD-10-CM

## 2022-07-31 DIAGNOSIS — S32049K Unspecified fracture of fourth lumbar vertebra, subsequent encounter for fracture with nonunion: Secondary | ICD-10-CM

## 2022-07-31 DIAGNOSIS — S32009K Unspecified fracture of unspecified lumbar vertebra, subsequent encounter for fracture with nonunion: Secondary | ICD-10-CM

## 2022-07-31 NOTE — Progress Notes (Signed)
Referring Physician:  Valerie Roys, DO [place],  [place] [id number]  Primary Physician:  Valerie Roys, DO  History of Present Illness: 07/31/2022 Jamie [name] is here today with a chief complaint of continued back pain.  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  She has been dealing with multiple family issues that have kept her from intervention.  She has not done any conservative management in the past year.    I have utilized the care everywhere function in epic to review the outside records available from external health systems.  01/16/2022 Jamie [name] is a 51 y.o with a history of lumbosacral disease, right rotator cuff tear, recent diagnosis of psoriatic arthritis, eczema, fibromyalgia who is here today with a chief complaint of neck and right sided arm pain. She states is been going on for about a year without any particular inciting event. She does have underlying rotator cuff injury but states that this feels different than that. She describes the pain as burning, stabbing, and shooting with some numbness and tingling particularly with sleep. She denies any exacerbating or relieving factors. She states that the pain radiates from her right neck down the posterior lateral aspect of her right arm and to the elbow without extension beyond the elbow. She denies any similar symptoms on the left. She denies any dexterity changes or disturbance in gait outside of her antalgic gait for chronic lumbosacral disease. She denies any significant weakness.  Conservative measures:  Physical therapy: none  Multimodal medical therapy including regular antiinflammatories: Morphine, oxycodone, Mobic, Skelaxin Injections: no recent epidural steroid injections  Past Surgery: no previous cervical surgeries   Jamie [name] has no symptoms of cervical myelopathy.  The symptoms are causing a significant impact on the  patient's life.    Review of Systems:  A 10 point review of systems is negative, except for the pertinent positives and negatives detailed in the HPI.  Past Medical History: Past Medical History:  Diagnosis Date   Anxiety    takes Ativan daily.  Panic Attack   Arthritis    Chronic back pain    herniated disc/stenosis/scoliosis   Depression    takes Effexor daily   Eczema    uses a cream daily as needed   Family history of adverse reaction to anesthesia    pta dad is very hard to wake up;excessive nausea   History of bronchitis    > 72yrago   Hx of dysplastic nevus 05/10/2020   neck - posterior, Severe Atypia. Shave removal 07/18/2020, margins free   Hypoglycemia    Insomnia    takes Lorazepam nightly   Joint pain    PONV (postoperative nausea and vomiting)    Seasonal allergies    uses Flonase daily   Torn rotator cuff    Vitamin D deficiency    takes Vit D daily   Wears contact lenses     Past Surgical History: Past Surgical History:  Procedure Laterality Date   BACK SURGERY  2008/2012   laminectomy 1st time and 2nd time laminectomy and fusion   COLONOSCOPY WITH PROPOFOL N/A 05/27/2020   Procedure: COLONOSCOPY WITH PROPOFOL;  Surgeon: TVirgel Manifold MD;  Location: [facility];  Service: Gastroenterology;  Laterality: N/A;  COVID POSITIVE AUGUST 16   MAXIMUM ACCESS (MAS)POSTERIOR LUMBAR INTERBODY FUSION (PLIF) 2 LEVEL N/A 09/01/2014   Procedure: Lumbar four-five, Lumbar five-Sacral one Maximum Access  Surgery posterior lumbar interbody fusion with interbody prosthesis posterior lateral arthrodesis posterior segmental instrumentation;  Surgeon: Elaina Hoops, MD;  Location: [place] [facility];  Service: Neurosurgery;  Laterality: N/A;  Lumbar four-five, Lumbar five-Sacral one Maximum Access Surgery posterior lumbar interbody fusion with i   SHOULDER ARTHROSCOPY WITH SUBACROMIAL DECOMPRESSION AND OPEN ROTATOR C Right 08/18/2021   Procedure: Right shoulder arthroscopic  rotator cuff repair, biceps tenodesis, subacromial decompression;  Surgeon: Leim Fabry, MD;  Location: [facility];  Service: Orthopedics;  Laterality: Right;    Allergies: Allergies as of 07/31/2022 - Review Complete 07/31/2022  Allergen Reaction Noted   Amoxicillin Diarrhea, Nausea And Vomiting, and Shortness Of Breath 08/23/2021   Shellfish allergy Hives 08/18/2014   Flexeril [cyclobenzaprine] Hives 08/18/2014    Medications: Current Meds  Medication Sig   acetaminophen (TYLENOL) 500 MG tablet Take 2 tablets (1,000 mg total) by mouth every 8 (eight) hours.   Ascorbic Acid (VITAMIN C PO) Take by mouth.   cholecalciferol (VITAMIN D) 1000 UNITS tablet Take 1,000 Units by mouth daily.   clobetasol cream (TEMOVATE) 0.05 % Apply to affected areas 1-2 times a day until rash improved. Avoid face, groin, axilla.   Dupilumab (DUPIXENT) 300 MG/2ML SOPN Inject 300 mg into the skin every 14 (fourteen) days.   fluconazole (DIFLUCAN) 200 MG tablet Take 1 tablet (200 mg total) by mouth daily.   fluticasone (FLONASE) 50 MCG/ACT nasal spray Place 1 spray into both nostrils daily as needed for allergies.   Halobetasol Prop-Tazarotene (DUOBRII) 0.01-0.045 % LOTN Apply 1 application topically at bedtime. qhs to aa rash on hands and feet until clear, then prn flares, avoid face, groin, axilla   lamoTRIgine (LAMICTAL) 100 MG tablet Take 1 tablet (100 mg total) by mouth daily.   LORazepam (ATIVAN) 0.5 MG tablet Take 1 tablet (0.5 mg total) by mouth daily as needed for anxiety. Do not take within 6 hours of your pain medicine   meloxicam (MOBIC) 7.5 MG tablet Take 7.5 mg by mouth 2 (two) times daily.   metaxalone (SKELAXIN) 800 MG tablet Take by mouth 2 (two) times daily.   montelukast (SINGULAIR) 10 MG tablet TAKE 1 TABLET BY MOUTH AT BEDTIME   morphine (KADIAN) 30 MG 24 hr capsule Take 30 mg by mouth daily.    Multiple Vitamin (MULTIVITAMIN ADULT PO) Take by mouth daily.   Multiple  Vitamins-Minerals (ZINC PO) Take by mouth.   nortriptyline (PAMELOR) 10 MG capsule Take 1-2 capsules (10-20 mg total) by mouth at bedtime.   oxyCODONE (ROXICODONE) 15 MG immediate release tablet Take 15 mg by mouth every 6 (six) hours as needed.    PARoxetine (PAXIL) 40 MG tablet Take 1 tablet (40 mg total) by mouth daily.   tacrolimus (PROTOPIC) 0.1 % ointment Apply topically 2 (two) times daily. Apply to feet and hands   Tavaborole 5 % SOLN Apply qhs to affected toenails    Social History: Social History   Tobacco Use   Smoking status: Former    Packs/day: 0.50    Years: 20.00    Total pack years: 10.00    Types: Cigarettes    Quit date: 10/18/2016    Years since quitting: 5.7   Smokeless tobacco: Never  Vaping Use   Vaping Use: Never used  Substance Use Topics   Alcohol use: No   Drug use: No    Family Medical History: Family History  Problem Relation Age of Onset   Arthritis Mother    Hyperlipidemia Mother  Migraines Mother    Arthritis Father    Diabetes Father    Heart disease Father    Atrial fibrillation Father    Diabetes Brother    Breast cancer Maternal Grandmother    Mental illness Neg Hx     Physical Examination: Vitals:   07/31/22 1020  BP: 139/79  Pulse: (!) 117  Temp: 98.4 F (36.9 C)    General: Patient is well developed, well nourished, calm, collected, and in no apparent distress. Attention to examination is appropriate.  Neck:   Supple.  Full range of motion.  Respiratory: Patient is breathing without any difficulty.   NEUROLOGICAL:     Awake, alert, oriented to person, place, and time.  Speech is clear and fluent. Fund of knowledge is appropriate.   Cranial Nerves: Pupils equal round and reactive to light.  Facial tone is symmetric.  Facial sensation is symmetric. Shoulder shrug is symmetric. Tongue protrusion is midline.  There is no pronator drift.  ROM of spine: full.    Strength: Side Biceps Triceps Deltoid Interossei Grip  Wrist Ext. Wrist Flex.  R '5 5 5 5 5 5 5  '$ L '5 5 5 5 5 5 5   '$ Side Iliopsoas Quads Hamstring PF DF EHL  R '5 5 5 5 5 5  '$ L '5 5 5 5 5 5   '$ Reflexes are 1+ and symmetric at the biceps, triceps, brachioradialis, patella and achilles.   Hoffman's is absent.   Bilateral upper and lower extremity sensation is intact to light touch.    No evidence of dysmetria noted.  Gait is antalgic.  She does have pain with FABER testing of the left leg..     Medical Decision Making  Imaging: MR C spine 02/09/22 IMPRESSION: 1. Cervical spondylosis, as outlined. 2. No significant spinal canal stenosis. 3. Multilevel neural foraminal narrowing, greatest on the left at C3-C4 (mild-to-moderate), on the right at C4-C5 (moderate) and on the right at C5-C6 (moderate). 4. Facet arthrosis, greatest on the right at C4-C5 and C5-C6 (advanced at these sites). 5. No more than mild disc degeneration. 6. Minimal grade 1 anterolisthesis at C5-C6.     Electronically Signed   By: Kellie Simmering D.O.   On: 02/09/2022 18:05  CT L spine 11/21/21   IMPRESSION: Solid fusion L3-4 without stenosis   Interbody fusion L4-5. There is lucency throughout the majority of the disc space however there may be an area of solid fusion anteriorly   Pedicle screw and interbody fusion L5-S1. No evidence of solid interbody fusion at this level.     Electronically Signed   By: Franchot Gallo M.D.   On: 11/21/2020 15:45  I have personally reviewed the images and agree with the above interpretation.  Assessment and Plan: Jamie [name] is a pleasant 51 y.o. 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  She has had previous radiographic demonstration of a focal kyphosis at L4-5 due to instability and pseudoarthrosis at this level.  She has ongoing back pain from this.  She has not had any conservative management in the past year, so I will send her for physical therapy.  We will update  her imaging as it is now 61 and half years old.  I have asked her to contact us when she is close to 6 weeks of physical therapy so that we can reevaluate.  I spent a total of 20 minutes in this patient's care today. This time was  spent reviewing pertinent records including imaging studies, obtaining and confirming history, performing a directed evaluation, formulating and discussing my recommendations, and documenting the visit within the medical record.    Thank you for involving me in the care of this patient.      Jamie [name] K. Izora Ribas MD, [facility]surgery

## 2022-08-01 ENCOUNTER — Other Ambulatory Visit (HOSPITAL_COMMUNITY): Payer: Self-pay

## 2022-08-02 ENCOUNTER — Other Ambulatory Visit: Payer: Self-pay

## 2022-08-06 ENCOUNTER — Telehealth (HOSPITAL_BASED_OUTPATIENT_CLINIC_OR_DEPARTMENT_OTHER): Payer: 59 | Admitting: Psychiatry

## 2022-08-06 ENCOUNTER — Encounter (HOSPITAL_COMMUNITY): Payer: Self-pay | Admitting: Psychiatry

## 2022-08-06 VITALS — Wt 168.0 lb

## 2022-08-06 DIAGNOSIS — F410 Panic disorder [episodic paroxysmal anxiety] without agoraphobia: Secondary | ICD-10-CM

## 2022-08-06 DIAGNOSIS — F3342 Major depressive disorder, recurrent, in full remission: Secondary | ICD-10-CM

## 2022-08-06 DIAGNOSIS — F411 Generalized anxiety disorder: Secondary | ICD-10-CM | POA: Diagnosis not present

## 2022-08-06 MED ORDER — PAROXETINE HCL 40 MG PO TABS: 40.0000 mg | ORAL_TABLET | Freq: Every day | ORAL | 2 refills | Status: DC

## 2022-08-06 MED ORDER — LAMOTRIGINE 150 MG PO TABS: 150.0000 mg | ORAL_TABLET | Freq: Every day | ORAL | 2 refills | Status: DC

## 2022-08-06 MED ORDER — LORAZEPAM 0.5 MG PO TABS: 0.5000 mg | ORAL_TABLET | Freq: Every day | ORAL | 0 refills | Status: DC | PRN

## 2022-08-06 MED ORDER — NORTRIPTYLINE HCL 10 MG PO CAPS: 10.0000 mg | ORAL_CAPSULE | Freq: Every day | ORAL | 1 refills | Status: DC
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
## 2022-08-08 NOTE — Therapy (Signed)
OUTPATIENT PHYSICAL THERAPY THORACOLUMBAR EVALUATION   Patient Name: Jamie [name] MRN: [id number] DOB:26-May-1971, 51 y.o., female Today's Date: 08/09/2022  END OF SESSION:  PT End of Session - 08/09/22 1414     Visit Number 1    Number of Visits 16    Date for PT Re-Evaluation 10/04/22    Authorization Type [facility] 25 visits then medical review necessary for more visits.    PT Start Time 1305    PT Stop Time 1359    PT Time Calculation (min) 54 min    Equipment Utilized During Treatment Gait belt    Activity Tolerance Patient tolerated treatment well    Behavior During Therapy WFL for tasks assessed/performed             Past Medical History:  Diagnosis Date   Anxiety    takes Ativan daily.  Panic Attack   Arthritis    Chronic back pain    herniated disc/stenosis/scoliosis   Depression    takes Effexor daily   Eczema    uses a cream daily as needed   Family history of adverse reaction to anesthesia    pta dad is very hard to wake up;excessive nausea   History of bronchitis    > 23yrago   Hx of dysplastic nevus 05/10/2020   neck - posterior, Severe Atypia. Shave removal 07/18/2020, margins free   Hypoglycemia    Insomnia    takes Lorazepam nightly   Joint pain    PONV (postoperative nausea and vomiting)    Seasonal allergies    uses Flonase daily   Torn rotator cuff    Vitamin D deficiency    takes Vit D daily   Wears contact lenses    Past Surgical History:  Procedure Laterality Date   BACK SURGERY  2008/2012   laminectomy 1st time and 2nd time laminectomy and fusion   COLONOSCOPY WITH PROPOFOL N/A 05/27/2020   Procedure: COLONOSCOPY WITH PROPOFOL;  Surgeon: TVirgel Manifold MD;  Location: [facility];  Service: Gastroenterology;  Laterality: N/A;  COVID POSITIVE AUGUST 16   MAXIMUM ACCESS (MAS)POSTERIOR LUMBAR INTERBODY FUSION (PLIF) 2 LEVEL N/A 09/01/2014   Procedure: Lumbar four-five, Lumbar five-Sacral one Maximum Access Surgery posterior  lumbar interbody fusion with interbody prosthesis posterior lateral arthrodesis posterior segmental instrumentation;  Surgeon: GElaina Hoops MD;  [facility];  Service: Neurosurgery;  Laterality: N/A;  Lumbar four-five, Lumbar five-Sacral one Maximum Access Surgery posterior lumbar interbody fusion with i   SHOULDER ARTHROSCOPY WITH SUBACROMIAL DECOMPRESSION AND OPEN ROTATOR C Right 08/18/2021   Procedure: Right shoulder arthroscopic rotator cuff repair, biceps tenodesis, subacromial decompression;  Surgeon: PLeim Fabry MD;  [place] [facility]  Service: Orthopedics;  Laterality: Right;   Patient Active Problem List   Diagnosis Date Noted   Generalized body aches 06/22/2022   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, unspecified ([place] 02/23/2022   [place] major [place]severe [place] 10/19/2020   GAD (generalized anxiety disorder) 04/26/2020   Panic attack 04/26/2020   At risk for long QT syndrome 04/26/2020   DDD (degenerative disc disease), lumbar 05/12/2019   Allergic rhinitis 05/11/2019   Primary osteoarthritis of right knee 03/20/2018   Insomnia 08/24/2017   Chronic back pain 10/16/2016   Pseudoarthrosis of lumbar spine 08/11/2015   Eczema 05/09/2015   Spinal stenosis of lumbar region 09/01/2014   Spondylolisthesis 08/03/2014   Displacement of lumbar intervertebral disc without myelopathy 08/03/2014    PCP: JValerie Roys DO   REFERRING PROVIDER: YMeade Maw MD  REFERRING DIAG:  M54.42,G89.29 (ICD-10-CM) - Chronic left-sided low back pain with left-sided sciatica  S32.009K (ICD-10-CM) - Pseudoarthrosis of lumbar spine  M25.552 (ICD-10-CM) - Left hip pain    Rationale for Evaluation and Treatment: Rehabilitation  THERAPY DIAG:  Abnormality of gait and mobility  Difficulty in walking, not elsewhere classified  Muscle weakness (generalized)  Other abnormalities of gait and mobility  Unsteadiness on feet  ONSET DATE: 07/21/2015  SUBJECTIVE:                                                                                                                                                                                            SUBJECTIVE STATEMENT: Pt reports her back pain is a "hot mess". Pt reports her low back pain progressed since she was about 14 indicating this has been a long-term chronic problem. Pt pain really started to progress around age [age]. First surgery 2008, second in 2012, 2 more surgeries in 2016 (January and December respectively). Pt had laminectomy, fusion and 2 more fusions (more recent surgical dates). Pt reports pain "never left" but she was unable to prioritize her pain management due to . 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  Patient reports she feels she will need surgery but also would like to try PT to improve her leg strength as well as improve her pain if possible.  Pt localizes pain to her back and radiates down her right hip and LE. Pt reports the last time she saw the MD her left hip and groin were very painful but it is feeling better now. Pt feels weak and shaky in her Romilda Joy and feels her core is very weak.   PERTINENT HISTORY:  Pt had laminectomy, fusion and 2 more fusions (more recent). Pt reports pain "never left" but she was unable to prioritize her pain management due to . 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   Pt localizes pain to her back and radiates down her right hip and LE. Pt reports the last time she saw the MD her left hip and groin were very painful but it is feeling better now. Pt feels weak and shaky in her Romilda Joy and feels her core is very weak. Pt infers she may require surgical intervention if PT and hopes PT will help with her strength, tightness and pain prior to any required surgical procedure.   PAIN:  Are you having pain? Yes: NPRS scale:  7/10 Pain location: Lower back radiating to both sides and shooting on her R  Pain description: shooting, sharp, stabbing, aching Aggravating factors: prolonged positioning without movement  Relieving factors: movement, zero gravity position in bed, medicine, ice, heat rotating   PRECAUTIONS: None  WEIGHT BEARING RESTRICTIONS: No  FALLS:  Has patient fallen in last 6 months? Yes. Number of falls 2 Pt fell going upstairs when her leg gave out and another time going down the stairs.   LIVING ENVIRONMENT: Lives with: lives with their family and is primary caretaker for her parents and has a lab dog Lives in: House/apartment Stairs: Yes: Internal: 2 flights  steps; can reach both and External: 4 steps; on right going up Has following equipment at home: None  OCCUPATION: Pt works as team lead mostly working at desk where she is able to stand and sit.   PLOF: Independent and Pain associated with all activities but is able to tolerate and complete   PATIENT GOALS: core strength, leg strength  NEXT MD VISIT: None scheduled but will be scheduling following MRI results  OBJECTIVE:   DIAGNOSTIC FINDINGS:  Getting MRI 08/11/22  PATIENT SURVEYS:  Modified Oswestry 60  FOTO 43   SCREENING FOR RED FLAGS: Bowel or bladder incontinence: No Spinal tumors: No Cauda equina syndrome: No Compression fracture: No Abdominal aneurysm: No  COGNITION: Overall cognitive status: Within functional limits for tasks assessed     SENSATION: Light touch: Impaired  on L LE only but able to feel as lower level (ankle and lower calf)   MUSCLE LENGTH: Hamstrings: Right 28 deg; Left 45 deg from full extension  Thomas test: Right 10 deg; Left 40 deg pain limiting   POSTURE: weight shift left  PALPATION: Complete session 2 if pt tolerates.   LUMBAR ROM:   AROM eval  Flexion To knees   Extension No extension tolerable ( moves knees to flexion_   Right lateral flexion To knee  Left lateral  flexion Mid thigh   Right rotation 30  Left rotation 26   (Blank rows = not tested)  LOWER EXTREMITY ROM:       Right eval Left eval  Hip flexion  Limited due to pain   Hip extension    Hip abduction    Hip adduction    Hip internal rotation    Hip external rotation    Knee flexion WNL WNL  Knee extension WNL WNL  Ankle dorsiflexion    Ankle plantarflexion    Ankle inversion    Ankle eversion     (Blank rows = not tested)  LOWER EXTREMITY MMT:    MMT Right eval Left eval  Hip flexion 3+ 3+  Hip extension    Hip abduction 4* 4*  Hip adduction 4* 4*  Hip internal rotation    Hip external rotation    Knee flexion 3+ 3+  Knee extension 3+ 3+  Ankle dorsiflexion 4 4  Ankle plantarflexion 4 4  Ankle inversion    Ankle eversion 4 4   (Blank rows = not tested) - Trembling noted in LE with all strength testing  -Asterix indicate pain limited strength -All tested in seated position   LUMBAR SPECIAL TESTS:  Straight leg raise test: Positive, Quadrant test: Positive, FABER test: Negative, and Thomas test: Positive FADIR positive Bilateral with hip and back pain Positive SLR bilateral with more intensity and more sensitive on the left compared to the right   FUNCTIONAL TESTS:  5 times sit to stand:  26.82 sec Pain noted with 5 times sit to stand test GAIT: Distance walked: 60 feet  Assistive device utilized: None Level of assistance: Complete Independence Comments: knee flexion on right throughout gait, unable to achieve heel strike on this side.   TODAY'S TREATMENT:                                                                                                                              DATE: 08/09/22     PATIENT EDUCATION:  Education details: POC, expectations with therapy  Person educated: Patient Education method: Explanation Education comprehension: verbalized understanding  HOME EXERCISE PROGRAM: To begin visit 2, consider piriformis stretch, HS  stretch in most tolerated position, and TrA activation for core strength, LE strengthening as pain tolerates.   ASSESSMENT:  CLINICAL IMPRESSION: Patient is a 51 year old female who was seen for physical therapy evaluation for chronic low back pain.  Patient presents with several positive findings for lower extremity and core weakness as well as for nerve involvement as evidenced by positive straight leg raise test.  Patient has significant weakness in her bilateral lower extremities as well as impaired sensation globally on the left side greater than the right side with light touch.  Patient did report during assessment that several stretches were slightly relieving with did not create long-lasting relief of her pain.  Patient will benefit from skilled physical therapy interventions in order to improve her lower extremity strength to prevent falls and her legs giving out, improve her low back and nerve related pain, and improve for her strength in her core musculature in order to provide some relief for her low back.  OBJECTIVE IMPAIRMENTS: Abnormal gait, decreased activity tolerance, decreased mobility, difficulty walking, decreased strength, impaired flexibility, impaired sensation, and pain.   ACTIVITY LIMITATIONS: carrying, lifting, bending, sitting, standing, squatting, stairs, and bed mobility  PARTICIPATION LIMITATIONS: cleaning, laundry, driving, shopping, community activity, and yard work  PERSONAL FACTORS: Age and Time since onset of injury/illness/exacerbation are also affecting patient's functional outcome.   REHAB POTENTIAL: Fair Chronicity and pending MRI results for potential surgical intervention   CLINICAL DECISION MAKING: Stable/uncomplicated  EVALUATION COMPLEXITY: Low   GOALS: Goals reviewed with patient? Yes  SHORT TERM GOALS: Target date: 09/06/2022      Patient will be independent in home exercise program to improve strength/mobility for better functional  independence with ADLs. Baseline: No HEP currently  Goal status: INITIAL  LONG TERM GOALS: Target date: 10/04/2022    Patient will improve focus on therapeutic outcome score by 7 points or more indicating improved self-reported pain levels of various activities Baseline: 43 Goal status: INITIAL  2.  Patient will improve modified Oswestry stability index score by 10% or greater indicating improved self-reported pain levels with various activities Baseline: 60 Goal status: INITIAL  3.  Patient will improve bilateral lower extremity strength to 4+ out of 5 or greater with all major planes in knee and hip strength tested in  seated positioning Baseline: See eval charts Goal status: INITIAL  4.  Patient will improve supine straight leg raise test on the left side to equal distance to the right number to indicate improved neural tension on her left lower extremity. Baseline: Left lower extremity pain exacerbation in low back and posterior leg very quick to cause pain with supine straight leg raise test Goal status: INITIAL  5.  Patient will improve 5 times sit to stand test to 18 seconds or less indicating improved lower extremity strength and stability as well as improved lower extremity power Baseline: 26.8 sec Goal status: INITIAL  PLAN:  PT FREQUENCY: 2x/week  PT DURATION: 8 weeks  PLANNED INTERVENTIONS: Therapeutic exercises, Therapeutic activity, Neuromuscular re-education, Balance training, Gait training, Patient/Family education, Self Care, Joint mobilization, Stair training, Spinal mobilization, Cryotherapy, Moist heat, Traction, and Dry needling  .  PLAN FOR NEXT SESSION: HEP for LE strength and stretches for relief of muscular tightness with focus on not exacerbation pain with the exercises.    Particia Lather, PT 08/09/2022, 2:16 PM

## 2022-08-09 ENCOUNTER — Encounter: Payer: Self-pay | Admitting: Physical Therapy

## 2022-08-09 ENCOUNTER — Ambulatory Visit: Payer: 59 | Attending: Neurosurgery | Admitting: Physical Therapy

## 2022-08-09 ENCOUNTER — Encounter: Payer: Self-pay | Admitting: Dermatology

## 2022-08-09 ENCOUNTER — Ambulatory Visit (INDEPENDENT_AMBULATORY_CARE_PROVIDER_SITE_OTHER): Payer: 59 | Admitting: Dermatology

## 2022-08-09 VITALS — BP 118/64 | HR 96

## 2022-08-09 DIAGNOSIS — Z86018 Personal history of other benign neoplasm: Secondary | ICD-10-CM | POA: Diagnosis not present

## 2022-08-09 DIAGNOSIS — L249 Irritant contact dermatitis, unspecified cause: Secondary | ICD-10-CM

## 2022-08-09 DIAGNOSIS — L820 Inflamed seborrheic keratosis: Secondary | ICD-10-CM | POA: Diagnosis not present

## 2022-08-09 DIAGNOSIS — D229 Melanocytic nevi, unspecified: Secondary | ICD-10-CM

## 2022-08-09 DIAGNOSIS — R2681 Unsteadiness on feet: Secondary | ICD-10-CM | POA: Insufficient documentation

## 2022-08-09 DIAGNOSIS — L821 Other seborrheic keratosis: Secondary | ICD-10-CM

## 2022-08-09 DIAGNOSIS — Z1283 Encounter for screening for malignant neoplasm of skin: Secondary | ICD-10-CM | POA: Diagnosis not present

## 2022-08-09 DIAGNOSIS — G8929 Other chronic pain: Secondary | ICD-10-CM | POA: Insufficient documentation

## 2022-08-09 DIAGNOSIS — R269 Unspecified abnormalities of gait and mobility: Secondary | ICD-10-CM | POA: Diagnosis not present

## 2022-08-09 DIAGNOSIS — L814 Other melanin hyperpigmentation: Secondary | ICD-10-CM

## 2022-08-09 DIAGNOSIS — M6281 Muscle weakness (generalized): Secondary | ICD-10-CM | POA: Insufficient documentation

## 2022-08-09 DIAGNOSIS — M5459 Other low back pain: Secondary | ICD-10-CM | POA: Insufficient documentation

## 2022-08-09 DIAGNOSIS — R2689 Other abnormalities of gait and mobility: Secondary | ICD-10-CM | POA: Diagnosis not present

## 2022-08-09 DIAGNOSIS — R262 Difficulty in walking, not elsewhere classified: Secondary | ICD-10-CM | POA: Diagnosis not present

## 2022-08-09 DIAGNOSIS — L309 Dermatitis, unspecified: Secondary | ICD-10-CM

## 2022-08-09 DIAGNOSIS — M25552 Pain in left hip: Secondary | ICD-10-CM | POA: Diagnosis not present

## 2022-08-09 DIAGNOSIS — Z79899 Other long term (current) drug therapy: Secondary | ICD-10-CM | POA: Diagnosis not present

## 2022-08-09 DIAGNOSIS — M5442 Lumbago with sciatica, left side: Secondary | ICD-10-CM | POA: Insufficient documentation

## 2022-08-09 DIAGNOSIS — L578 Other skin changes due to chronic exposure to nonionizing radiation: Secondary | ICD-10-CM

## 2022-08-09 DIAGNOSIS — S32009K Unspecified fracture of unspecified lumbar vertebra, subsequent encounter for fracture with nonunion: Secondary | ICD-10-CM | POA: Insufficient documentation

## 2022-08-09 DIAGNOSIS — L603 Nail dystrophy: Secondary | ICD-10-CM

## 2022-08-09 NOTE — Patient Instructions (Addendum)
Cryotherapy Aftercare  Wash gently with soap and water everyday.   Apply Vaseline and Band-Aid daily until healed.   Eucrisa samples given to apply twice daily as needed to affected area on face.   Continue Kerydin solution once daily as directed.    Continue Dupixent as directed.   Recommend daily broad spectrum sunscreen SPF 30+ to sun-exposed areas, reapply every 2 hours as needed. Call for new or changing lesions.  Staying in the shade or wearing long sleeves, sun glasses (UVA+UVB protection) and wide brim hats (4-inch brim around the entire circumference of the hat) are also recommended for sun protection.    Melanoma ABCDEs  Melanoma is the most dangerous type of skin cancer, and is the leading cause of death from skin disease.  You are more likely to develop melanoma if you: Have light-colored skin, light-colored eyes, or red or blond hair Spend a lot of time in the sun Tan regularly, either outdoors or in a tanning bed Have had blistering sunburns, especially during childhood Have a close family member who has had a melanoma Have atypical moles or large birthmarks  Early detection of melanoma is key since treatment is typically straightforward and cure rates are extremely high if we catch it early.   The first sign of melanoma is often a change in a mole or a new dark spot.  The ABCDE system is a way of remembering the signs of melanoma.  A for asymmetry:  The two halves do not match. B for border:  The edges of the growth are irregular. C for color:  A mixture of colors are present instead of an even brown color. D for diameter:  Melanomas are usually (but not always) greater than 24m - the size of a pencil eraser. E for evolution:  The spot keeps changing in size, shape, and color.  Please check your skin once per month between visits. You can use a small mirror in front and a large mirror behind you to keep an eye on the back side or your body.   If you see any new or  changing lesions before your next follow-up, please call to schedule a visit.  Please continue daily skin protection including broad spectrum sunscreen SPF 30+ to sun-exposed areas, reapplying every 2 hours as needed when you're outdoors.   Staying in the shade or wearing long sleeves, sun glasses (UVA+UVB protection) and wide brim hats (4-inch brim around the entire circumference of the hat) are also recommended for sun protection.     Due to recent changes in healthcare laws, you may see results of your pathology and/or laboratory studies on MyChart before the doctors have had a chance to review them. We understand that in some cases there may be results that are confusing or concerning to you. Please understand that not all results are received at the same time and often the doctors may need to interpret multiple results in order to provide you with the best plan of care or course of treatment. Therefore, we ask that you please give u[place]2 business days to thoroughly review all your results before contacting the office for clarification. Should we see a critical lab result, you will be contacted sooner.   If You Need Anything After Your Visit  If you have any questions or concerns for your doctor, please call our main line at [phone] press option 4 to reach your doctor's medical assistant. If no one answers, please leave a voicemail as directed  and we will return your call as soon as possible. Messages left after 4 pm will be answered the following business day.   You may also send [place] a message via [facility]. We typically respond to MyChart messages within 1-2 business days.  For prescription refills, please ask your pharmacy to contact our office. Our fax number is [phone].  If you have an urgent issue when the clinic is closed that cannot wait until the next business day, you can page your doctor at the number below.    Please note that while we do our best to be available for  urgent issues outside of office hours, we are not available 24/7.   If you have an urgent issue and are unable to reach [place], you may choose to seek medical care at your doctor's office, retail clinic, urgent care center, or emergency room.  If you have a medical emergency, please immediately call 911 or go to the emergency department.  Pager Numbers  - Dr. Nehemiah Massed: [phone]  - Dr. Laurence Ferrari: [phone]  - Dr. Nicole Kindred: [phone]  In the event of inclement weather, please call our main line at [phone] for an update on the status of any delays or closures.  Dermatology Medication Tips: Please keep the boxes that topical medications come in in order to help keep track of the instructions about where and how to use these. Pharmacies typically print the medication instructions only on the boxes and not directly on the medication tubes.   If your medication is too expensive, please contact our office at [phone] option 4 or send [place] a message through [facility].   We are unable to tell what your co-pay for medications will be in advance as this is different depending on your insurance coverage. However, we may be able to find a substitute medication at lower cost or fill out paperwork to get insurance to cover a needed medication.   If a prior authorization is required to get your medication covered by your insurance company, please allow [place] 1-2 business days to complete this process.  Drug prices often vary depending on where the prescription is filled and some pharmacies may offer cheaper prices.  The website www.goodrx.com contains coupons for medications through different pharmacies. The prices here do not account for what the cost may be with help from insurance (it may be cheaper with your insurance), but the website can give you the price if you did not use any insurance.  - You can print the associated coupon and take it with your prescription to the pharmacy.  - You may also  stop by our office during regular business hours and pick up a GoodRx coupon card.  - If you need your prescription sent electronically to a different pharmacy, notify our office through [facility] or by phone at [phone] option 4.     Si Usted Necesita Algo Despus de Su Visita  Tambin puede enviarnos un mensaje a travs de Pharmacist, community. Por lo general respondemos a los mensajes de MyChart en el transcurso de 1 a 2 das hbiles.  Para renovar recetas, por favor pida a su farmacia que se ponga en contacto con nuestra oficina. Harland Dingwall de fax es [place] [phone].  Si tiene un asunto urgente cuando la clnica est cerrada y que no puede esperar hasta el siguiente da hbil, puede llamar/localizar a su doctor(a) al nmero que aparece a continuacin.   Por favor, tenga en cuenta que aunque hacemos todo lo posible para estar  disponibles para asuntos urgentes fuera del horario de [place], no estamos disponibles las 24 horas del da, los 7 das de la [place].   Si tiene un problema urgente y no puede comunicarse con nosotros, puede optar por buscar atencin mdica  en el consultorio de su doctor(a), en una clnica privada, en un centro de atencin urgente o en una sala de emergencias.  Si tiene Engineering geologist, por favor llame inmediatamente al 911 o vaya a la sala de emergencias.  Nmeros de bper  - Dr. Nehemiah Massed: [phone]  - Dra. Moye: [phone]  - Dra. Nicole Kindred: [phone]  En caso de inclemencias del [place], por favor llame a Johnsie Kindred principal al [phone] para una actualizacin sobre el [place] de cualquier retraso o cierre.  Consejos para la medicacin en dermatologa: Por favor, guarde las cajas en las que vienen los medicamentos de uso tpico para ayudarle a seguir las instrucciones sobre dnde y cmo usarlos. Las farmacias generalmente imprimen las instrucciones del medicamento slo en las cajas y no directamente en los tubos del [place].   Si  su medicamento es muy caro, por favor, pngase en contacto con Zigmund Daniel llamando al [phone] y presione la opcin 4 o envenos un mensaje a travs de Pharmacist, community.   No podemos decirle cul ser su copago por los medicamentos por adelantado ya que esto es diferente dependiendo de la cobertura de su seguro. Sin embargo, es posible que podamos encontrar un medicamento sustituto a Electrical engineer un formulario para que el seguro cubra el medicamento que se considera necesario.   Si se requiere una autorizacin previa para que su compaa de seguros [place] su medicamento, por favor permtanos de 1 a 2 das hbiles para completar este proceso.  Los precios de los medicamentos varan con frecuencia dependiendo del Environmental consultant de dnde se surte la receta y alguna farmacias pueden ofrecer precios ms baratos.  El sitio web www.goodrx.com tiene cupones para medicamentos de Airline pilot. Los precios aqu no tienen en cuenta lo que podra costar con la ayuda del seguro (puede ser ms barato con su seguro), pero el sitio web puede darle el precio si no utiliz Research scientist (physical sciences).  - Puede imprimir el cupn correspondiente y llevarlo con su receta a la farmacia.  - Tambin puede pasar por nuestra oficina durante el horario de atencin regular y Charity fundraiser una tarjeta de cupones de GoodRx.  - Si necesita que su receta se enve electrnicamente a una farmacia diferente, informe a nuestra oficina a travs de MyChart de [place] o por telfono llamando al [phone] y presione la opcin 4.

## 2022-08-09 NOTE — Progress Notes (Signed)
Follow-Up Visit   Subjective  Jamie [name] is a 51 y.o. female who presents for the following: Annual Exam (Hx DN. Check spot on scalp) and Dermatitis (Hand dermatitis. Patient on [place]).  She has spot on L side that gets irritated by pants.  Abnormal nails are doing better.  The patient presents for Total-Body Skin Exam (TBSE) for skin cancer screening and mole check.  The patient has spots, moles and lesions to be evaluated, some may be new or changing and the patient has concerns that these could be cancer.   The following portions of the chart were reviewed this encounter and updated as appropriate:       Review of Systems:  No other skin or systemic complaints except as noted in HPI or Assessment and Plan.  Objective  Well appearing patient in no apparent distress; mood and affect are within normal limits.  A full examination was performed including scalp, head, eyes, ears, nose, lips, neck, chest, axillae, abdomen, back, buttocks, bilateral upper extremities, bilateral lower extremities, hands, feet, fingers, toes, fingernails, and toenails. All findings within normal limits unless otherwise noted below.  Right Parietal Scalp, left breast 1.5 cm waxy tan plaque   B/L hands, B/L feet Xerosis at hands  Left Abdomen (side) - Lower x1 Erythematous keratotic or waxy stuck-on plaque.  Left Nasolabial Fold Erythematous patch  Left Great Toe Nail, L thumb, B/L-5th fingernails Dystrophic nail growth with mild distal onycholysis    Assessment & Plan  Seborrheic keratosis Right Parietal Scalp, left breast  Reassured benign age-related growth.  Recommend observation.  Discussed cryotherapy if spot(s) become irritated or inflamed.  Hand dermatitis B/L hands, B/L feet  Chronic condition with duration or expected duration over one year. Currently well-controlled on Dupixent  Continue Dupixent q2wks as directed.  Dupilumab (Dupixent) is a treatment given by injection for  adults and children with moderate-to-severe atopic dermatitis. Goal is control of skin condition, not cure. It is given as 2 injections at the first dose followed by 1 injection ever 2 weeks thereafter.  Young children are dosed monthly.  Potential side effects include allergic reaction, herpes infections, injection site reactions and conjunctivitis (inflammation of the eyes).  The use of Dupixent requires long term medication management, including periodic office visits.   Hand Dermatitis is a chronic type of eczema that can come and go on the hands and fingers.  While there is no cure, the rash and symptoms can be managed with topical prescription medications, and for more severe cases, with systemic medications.  Recommend mild soap and routine use of moisturizing cream after handwashing.  Minimize soap/water exposure when possible.     Related Medications tacrolimus (PROTOPIC) 0.1 % ointment Apply topically 2 (two) times daily. Apply to feet and hands  Dupilumab (DUPIXENT) 300 MG/2ML SOPN Inject 300 mg into the skin every 14 (fourteen) days.  Inflamed seborrheic keratosis Left Abdomen (side) - Lower x1  Symptomatic, irritating, patient would like treated.  Destruction of lesion - Left Abdomen (side) - Lower x1  Destruction method: cryotherapy   Informed consent: discussed and consent obtained   Lesion destroyed using liquid nitrogen: Yes   Region frozen until ice ball extended beyond lesion: Yes   Outcome: patient tolerated procedure well with no complications   Post-procedure details: wound care instructions given   Additional details:  Prior to procedure, discussed risks of blister formation, small wound, skin dyspigmentation, or rare scar following cryotherapy. Recommend Vaseline ointment to treated areas while healing.  Irritant contact dermatitis, unspecified trigger Left Nasolabial Fold  Secondary to mask use  Eucrisa ointment samples given to apply twice daily as needed.    Nail dystrophy Left Great Toe Nail, L thumb, B/L-5th fingernails  Much improved  Continue Kerydin solution once daily as directed.   Related Medications Tavaborole 5 % SOLN Apply qhs to affected toenails  fluconazole (DIFLUCAN) 200 MG tablet Take 1 tablet (200 mg total) by mouth daily.   History of Dysplastic Nevi. Neck - posterior, Severe Atypia. Shave removal 07/18/2020, margins free  - No evidence of recurrence today - Recommend regular full body skin exams - Recommend daily broad spectrum sunscreen SPF 30+ to sun-exposed areas, reapply every 2 hours as needed.  - Call if any new or changing lesions are noted between office visits  Lentigines - Scattered tan macules - Due to sun exposure - Benign-appearing, observe - Recommend daily broad spectrum sunscreen SPF 30+ to sun-exposed areas, reapply every 2 hours as needed. - Call for any changes  Seborrheic Keratoses. Back - Stuck-on, waxy, tan-brown papules and/or plaques  - Benign-appearing - Discussed benign etiology and prognosis. - Observe - Call for any changes  Melanocytic Nevi. Back - Tan-brown and/or pink-flesh-colored symmetric macules and papules - Benign appearing on exam today - Observation - Call clinic for new or changing moles - Recommend daily use of broad spectrum spf 30+ sunscreen to sun-exposed areas.   Hemangiomas - Red papules - Discussed benign nature - Observe - Call for any changes  Actinic Damage - Chronic condition, secondary to cumulative UV/sun exposure - diffuse scaly erythematous macules with underlying dyspigmentation - Recommend daily broad spectrum sunscreen SPF 30+ to sun-exposed areas, reapply every 2 hours as needed.  - Staying in the shade or wearing long sleeves, sun glasses (UVA+UVB protection) and wide brim hats (4-inch brim around the entire circumference of the hat) are also recommended for sun protection.  - Call for new or changing lesions.  Skin cancer screening  performed today.  Return in about 1 year (around 08/10/2023) for TBSE, HxDN. Dermatitis follow up in 6 months.  I, Emelia Salisbury, CMA, am acting as scribe for [name], MD.  Documentation: I have reviewed the above documentation for accuracy and completeness, and I agree with the above.  [name] MD

## 2022-08-10 ENCOUNTER — Ambulatory Visit
Admission: RE | Admit: 2022-08-10 | Discharge: 2022-08-10 | Disposition: A | Discharge status: Home / Self Care | Payer: 59 | Source: Ambulatory Visit | Attending: Neurosurgery | Admitting: Neurosurgery

## 2022-08-10 ENCOUNTER — Encounter: Payer: Self-pay | Admitting: Physical Therapy

## 2022-08-10 DIAGNOSIS — M25552 Pain in left hip: Secondary | ICD-10-CM | POA: Diagnosis not present

## 2022-08-10 DIAGNOSIS — S32009K Unspecified fracture of unspecified lumbar vertebra, subsequent encounter for fracture with nonunion: Secondary | ICD-10-CM

## 2022-08-10 DIAGNOSIS — M545 Low back pain, unspecified: Secondary | ICD-10-CM | POA: Diagnosis not present

## 2022-08-10 DIAGNOSIS — M5126 Other intervertebral disc displacement, lumbar region: Secondary | ICD-10-CM | POA: Diagnosis not present

## 2022-08-10 DIAGNOSIS — M5442 Lumbago with sciatica, left side: Secondary | ICD-10-CM | POA: Diagnosis not present

## 2022-08-10 DIAGNOSIS — G8929 Other chronic pain: Secondary | ICD-10-CM

## 2022-08-14 ENCOUNTER — Ambulatory Visit: Payer: Commercial Managed Care - PPO | Attending: Neurosurgery | Admitting: Physical Therapy

## 2022-08-14 ENCOUNTER — Ambulatory Visit: Payer: 59 | Attending: Neurosurgery

## 2022-08-14 DIAGNOSIS — M5459 Other low back pain: Secondary | ICD-10-CM | POA: Diagnosis not present

## 2022-08-14 DIAGNOSIS — M6281 Muscle weakness (generalized): Secondary | ICD-10-CM | POA: Diagnosis not present

## 2022-08-14 NOTE — Therapy (Signed)
OUTPATIENT PHYSICAL THERAPY THORACOLUMBAR NOTE   Patient Name: Jamie [name] MRN: [id number] DOB:24-Jun-1971, 51 y.o., female Today's Date: 08/14/2022  END OF SESSION:  PT End of Session - 08/14/22 1553     Visit Number 2    Number of Visits 16    Date for PT Re-Evaluation 10/04/22    Authorization Type [facility] 25 visits then medical review necessary for more visits.    PT Start Time 1503    PT Stop Time 1547    PT Time Calculation (min) 44 min    Equipment Utilized During Treatment Gait belt    Activity Tolerance Patient tolerated treatment well;Patient limited by pain    Behavior During Therapy WFL for tasks assessed/performed              Past Medical History:  Diagnosis Date   Anxiety    takes Ativan daily.  Panic Attack   Arthritis    Chronic back pain    herniated disc/stenosis/scoliosis   Depression    takes Effexor daily   Eczema    uses a cream daily as needed   Family history of adverse reaction to anesthesia    pta dad is very hard to wake up;excessive nausea   History of bronchitis    > 62yr[age]   Hx of dysplastic nevus 05/10/2020   neck - posterior, Severe Atypia. Shave removal 07/18/2020, margins free   Hypoglycemia    Insomnia    takes Lorazepam nightly   Joint pain    PONV (postoperative nausea and vomiting)    Seasonal allergies    uses Flonase daily   Torn rotator cuff    Vitamin D deficiency    takes Vit D daily   Wears contact lenses    Past Surgical History:  Procedure Laterality Date   BACK SURGERY  2008/2012   laminectomy 1st time and 2nd time laminectomy and fusion   COLONOSCOPY WITH PROPOFOL N/A 05/27/2020   Procedure: COLONOSCOPY WITH PROPOFOL;  Surgeon: Jamie Manifold MD;  Location: [facility];  Service: Gastroenterology;  Laterality: N/A;  COVID POSITIVE AUGUST 16   MAXIMUM ACCESS (MAS)POSTERIOR LUMBAR INTERBODY FUSION (PLIF) 2 LEVEL N/A 09/01/2014   Procedure: Lumbar four-five, Lumbar five-Sacral one Maximum Access  Surgery posterior lumbar interbody fusion with interbody prosthesis posterior lateral arthrodesis posterior segmental instrumentation;  Surgeon: GElaina Hoops MD;  [facility];  Service: Neurosurgery;  Laterality: N/A;  Lumbar four-five, Lumbar five-Sacral one Maximum Access Surgery posterior lumbar interbody fusion with i   SHOULDER ARTHROSCOPY WITH SUBACROMIAL DECOMPRESSION AND OPEN ROTATOR C Right 08/18/2021   Procedure: Right shoulder arthroscopic rotator cuff repair, biceps tenodesis, subacromial decompression;  Surgeon: PLeim Fabry MD;  [place] [facility]  Service: Orthopedics;  Laterality: Right;   Patient Active Problem List   Diagnosis Date Noted   Generalized body aches 06/22/2022   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, unspecified ([place] 02/23/2022   [place] 10/19/2020   GAD (generalized anxiety disorder) 04/26/2020   Panic attack 04/26/2020   At risk for long QT syndrome 04/26/2020   DDD (degenerative disc disease), lumbar 05/12/2019   Allergic rhinitis 05/11/2019   Primary osteoarthritis of right knee 03/20/2018   Insomnia 08/24/2017   Chronic back pain 10/16/2016   Pseudoarthrosis of lumbar spine 08/11/2015   Eczema 05/09/2015   Spinal stenosis of lumbar region 09/01/2014   Spondylolisthesis 08/03/2014   Displacement of lumbar intervertebral disc without myelopathy 08/03/2014    PCP: Jamie Roys DO   REFERRING  PROVIDER: Meade Maw, MD   REFERRING DIAG:  [phone] (ICD-10-CM) - Chronic left-sided low back pain with left-sided sciatica  S32.009K (ICD-10-CM) - Pseudoarthrosis of lumbar spine  M25.552 (ICD-10-CM) - Left hip pain    Rationale for Evaluation and Treatment: Rehabilitation  THERAPY DIAG:  Other low back pain  Muscle weakness (generalized)  ONSET DATE: 07/21/2015  SUBJECTIVE:                                                                                                                                                                                            SUBJECTIVE STATEMENT: Pt not feeling great today. Thinks the weather might be contributing to worsened symptoms. She rates her back pain as 6.5-7/10, she reports this is around her baseline level of pain.  PERTINENT HISTORY:  Pt had laminectomy, fusion and 2 more fusions (more recent). Pt reports pain "never left" but she was unable to prioritize her pain management due to . 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   Pt localizes pain to her back and radiates down her right hip and LE. Pt reports the last time she saw the MD her left hip and groin were very painful but it is feeling better now. Pt feels weak and shaky in her Jamie [name] and feels her core is very weak. Pt infers she may require surgical intervention if PT and hopes PT will help with her strength, tightness and pain prior to any required surgical procedure.   PAIN:  Are you having pain? Yes: NPRS scale: 7/10 Pain location: Lower back radiating to both sides and shooting on her R  Pain description: shooting, sharp, stabbing, aching Aggravating factors: prolonged positioning without movement  Relieving factors: movement, zero gravity position in bed, medicine, ice, heat rotating   PRECAUTIONS: None  WEIGHT BEARING RESTRICTIONS: No  FALLS:  Has patient fallen in last 6 months? Yes. Number of falls 2 Pt fell going upstairs when her leg gave out and another time going down the stairs.   LIVING ENVIRONMENT: Lives with: lives with their family and is primary caretaker for her parents and has a lab dog Lives in: House/apartment Stairs: Yes: Internal: 2 flights  steps; can reach both and External: 4 steps; on right going up Has following equipment at home: None  OCCUPATION: Pt works as team lead mostly working at desk where she is able to stand and sit.   PLOF: Independent and Pain associated with  all activities but is able to tolerate and complete   PATIENT GOALS: core strength,  leg strength  NEXT MD VISIT: None scheduled but will be scheduling following MRI results  OBJECTIVE: from eval unless specified otherwise  DIAGNOSTIC FINDINGS:  Getting MRI 08/11/22  PATIENT SURVEYS:  Modified Oswestry 60  FOTO 43   SCREENING FOR RED FLAGS: Bowel or bladder incontinence: No Spinal tumors: No Cauda equina syndrome: No Compression fracture: No Abdominal aneurysm: No  COGNITION: Overall cognitive status: Within functional limits for tasks assessed     SENSATION: Light touch: Impaired  on L LE only but able to feel as lower level (ankle and lower calf)   MUSCLE LENGTH: Hamstrings: Right 28 deg; Left 45 deg from full extension  Thomas test: Right 10 deg; Left 40 deg pain limiting   POSTURE: weight shift left  PALPATION: Complete session 2 if pt tolerates.   LUMBAR ROM:   AROM eval  Flexion To knees   Extension No extension tolerable ( moves knees to flexion_   Right lateral flexion To knee  Left lateral flexion Mid thigh   Right rotation 30  Left rotation 26   (Blank rows = not tested)  LOWER EXTREMITY ROM:       Right eval Left eval  Hip flexion  Limited due to pain   Hip extension    Hip abduction    Hip adduction    Hip internal rotation    Hip external rotation    Knee flexion WNL WNL  Knee extension WNL WNL  Ankle dorsiflexion    Ankle plantarflexion    Ankle inversion    Ankle eversion     (Blank rows = not tested)  LOWER EXTREMITY MMT:    MMT Right eval Left eval  Hip flexion 3+ 3+  Hip extension    Hip abduction 4* 4*  Hip adduction 4* 4*  Hip internal rotation    Hip external rotation    Knee flexion 3+ 3+  Knee extension 3+ 3+  Ankle dorsiflexion 4 4  Ankle plantarflexion 4 4  Ankle inversion    Ankle eversion 4 4   (Blank rows = not tested) - Trembling noted in LE with all strength testing  -Asterix indicate pain limited  strength -All tested in seated position   LUMBAR SPECIAL TESTS:  Straight leg raise test: Positive, Quadrant test: Positive, FABER test: Negative, and Thomas test: Positive FADIR positive Bilateral with hip and back pain Positive SLR bilateral with more intensity and more sensitive on the left compared to the right   FUNCTIONAL TESTS:  5 times sit to stand: 26.82 sec Pain noted with 5 times sit to stand test GAIT: Distance walked: 60 feet  Assistive device utilized: None Level of assistance: Complete Independence Comments: knee flexion on right throughout gait, unable to achieve heel strike on this side.   TODAY'S TREATMENT:  DATE: 08/14/22  On mat table LTRs x 2 min each side within pain-free ROM  TrA activation in hooklye 10x 5 sec holds. Cuing for technique with additional tactile feedback. Mild pain initially on L side low back but then reported pain back at baseline  Supine hip abduction LLE - 5x discontinued due to pain in positioning (low back ext with intervention currently too irritating to low back)  YTB hooklye hip abd/er 2x10 BLE (reports no latex allergy)  Supine quad set 2x10x5 sec hold/rep - reports feels good  Hooklye adductor squeeze with pball 2x10x5 sec holds/rep  Side-lye clamshell 2x10 each LE- with last few reps became somewhat uncomfortable. Advised pt to discontinue this one at home if it continues to increase  her pain  P.ball hamstring curls 2x10, cuing to perform within pain-free range   Hooklye Piriformis stretch 30 sec each LE, and additional 30 sec LLE only. LLE felt OK but RLE side is pain-limited  Seated hamstring stretch stretch 30 sec each LE within pain-free range  Pt does report 1/2 point increase in pain at end of session. Thinks amount of activity might have resulted in slight pain increase instead of specific  intervention.   PATIENT EDUCATION:  Education details: Pt educated throughout session about proper posture and technique with exercises. Improved exercise technique, movement at target joints, use of target muscles after min to mod verbal, visual, tactile cues. HEP HEP  Person educated: Patient Education method: Explanation, Demonstration, Tactile cues, Verbal cues, and Handouts Education comprehension: verbalized understanding, returned demonstration, and needs further education  HOME EXERCISE PROGRAM: Access Code: [email or web address] URL: [email or web address] Date: 08/14/2022 Prepared by: Ricard Dillon  Exercises - Supine Transversus Abdominis Bracing - Hands on Stomach  - 1 x daily - 7 x weekly - 2 sets - 10 reps - 5 second  hold - Supine Quad Set  - 1 x daily - 7 x weekly - 2 sets - 10 reps - 5 sec  hold - Clamshell  - 1 x daily - 7 x weekly - 2 sets - 10 reps - Supine Hip Adduction Isometric with Ball  - 1 x daily - 7 x weekly - 2 sets - 10 reps - 5 seocnd hold - Hooklying Clamshell with Resistance  - 1 x daily - 7 x weekly - 2 sets - 10 reps  ASSESSMENT:  CLINICAL IMPRESSION:  Reviewed and initiated HEP to promote core and BLE strengthening. Pt tolerated majority of HEP well, but did report some mild increases in discomfort/pain with a few interventions. These interventions were either modified to comfort, or, if pt unable to achieve comfort, removed from HEP and discontinued. It is also possible amount of therex performed today is currently too much for pt and could be regressed next session to determine if this improves pain response. The patient will benefit from further skilled physical therapy interventions in order to improve her lower extremity strength, to prevent falls, and to improve her low back and nerve related pain.  OBJECTIVE IMPAIRMENTS: Abnormal gait, decreased activity tolerance, decreased mobility, difficulty walking, decreased strength, impaired flexibility,  impaired sensation, and pain.   ACTIVITY LIMITATIONS: carrying, lifting, bending, sitting, standing, squatting, stairs, and bed mobility  PARTICIPATION LIMITATIONS: cleaning, laundry, driving, shopping, community activity, and yard work  PERSONAL FACTORS: Age and Time since onset of injury/illness/exacerbation are also affecting patient's functional outcome.   REHAB POTENTIAL: Fair Chronicity and pending MRI results for potential surgical intervention   CLINICAL DECISION MAKING: Stable/uncomplicated  EVALUATION COMPLEXITY: Low  GOALS: Goals reviewed with patient? Yes  SHORT TERM GOALS: Target date: 09/06/2022      Patient will be independent in home exercise program to improve strength/mobility for better functional independence with ADLs. Baseline: No HEP currently  Goal status: INITIAL  LONG TERM GOALS: Target date: 10/04/2022    Patient will improve focus on therapeutic outcome score by 7 points or more indicating improved self-reported pain levels of various activities Baseline: 43 Goal status: INITIAL  2.  Patient will improve modified Oswestry stability index score by 10% or greater indicating improved self-reported pain levels with various activities Baseline: 60 Goal status: INITIAL  3.  Patient will improve bilateral lower extremity strength to 4+ out of 5 or greater with all major planes in knee and hip strength tested in seated positioning Baseline: See eval charts Goal status: INITIAL  4.  Patient will improve supine straight leg raise test on the left side to equal distance to the right number to indicate improved neural tension on her left lower extremity. Baseline: Left lower extremity pain exacerbation in low back and posterior leg very quick to cause pain with supine straight leg raise test Goal status: INITIAL  5.  Patient will improve 5 times sit to stand test to 18 seconds or less indicating improved lower extremity strength and stability as well as  improved lower extremity power Baseline: 26.8 sec Goal status: INITIAL  PLAN:  PT FREQUENCY: 2x/week  PT DURATION: 8 weeks  PLANNED INTERVENTIONS: Therapeutic exercises, Therapeutic activity, Neuromuscular re-education, Balance training, Gait training, Patient/Family education, Self Care, Joint mobilization, Stair training, Spinal mobilization, Cryotherapy, Moist heat, Traction, and Dry needling  .  PLAN FOR NEXT SESSION: LE strength and stretches for relief of muscular tightness with focus on not exacerbation pain with the exercises. Continue plan    Zollie Pee, PT 08/14/2022, 4:03 PM

## 2022-08-14 NOTE — Therapy (Deleted)
OUTPATIENT PHYSICAL THERAPY THORACOLUMBAR TREATMENT    Patient Name: Marvina Danner MRN: [id number] DOB:[birth year], 51 y.o., female Today's Date: 08/14/2022  END OF SESSION:    Past Medical History:  Diagnosis Date   Anxiety    takes Ativan daily.  Panic Attack   Arthritis    Chronic back pain    herniated disc/stenosis/scoliosis   Depression    takes Effexor daily   Eczema    uses a cream daily as needed   Family history of adverse reaction to anesthesia    pta dad is very hard to wake up;excessive nausea   History of bronchitis    > 37yr[age]   Hx of dysplastic nevus 05/10/2020   neck - posterior, Severe Atypia. Shave removal 07/18/2020, margins free   Hypoglycemia    Insomnia    takes Lorazepam nightly   Joint pain    PONV (postoperative nausea and vomiting)    Seasonal allergies    uses Flonase daily   Torn rotator cuff    Vitamin D deficiency    takes Vit D daily   Wears contact lenses    Past Surgical History:  Procedure Laterality Date   BACK SURGERY  2008/2012   laminectomy 1st time and 2nd time laminectomy and fusion   COLONOSCOPY WITH PROPOFOL N/A 05/27/2020   Procedure: COLONOSCOPY WITH PROPOFOL;  Surgeon: TVirgel Manifold MD;  Location: [facility];  Service: Gastroenterology;  Laterality: N/A;  COVID POSITIVE AUGUST 16   MAXIMUM ACCESS (MAS)POSTERIOR LUMBAR INTERBODY FUSION (PLIF) 2 LEVEL N/A 09/01/2014   Procedure: Lumbar four-five, Lumbar five-Sacral one Maximum Access Surgery posterior lumbar interbody fusion with interbody prosthesis posterior lateral arthrodesis posterior segmental instrumentation;  Surgeon: GElaina Hoops MD;  [facility] [facility];  Service: Neurosurgery;  Laterality: N/A;  Lumbar four-five, Lumbar five-Sacral one Maximum Access Surgery posterior lumbar interbody fusion with i   SHOULDER ARTHROSCOPY WITH SUBACROMIAL DECOMPRESSION AND OPEN ROTATOR C Right 08/18/2021   Procedure: Right shoulder arthroscopic rotator cuff repair,  biceps tenodesis, subacromial decompression;  Surgeon: PLeim Fabry MD;  [place] [facility]  Service: Orthopedics;  Laterality: Right;   Patient Active Problem List   Diagnosis Date Noted   Generalized body aches 06/22/2022   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, unspecified ([facility] 02/23/2022   Recurrent major depression-severe ([place] 10/19/2020   GAD (generalized anxiety disorder) 04/26/2020   Panic attack 04/26/2020   At risk for long QT syndrome 04/26/2020   DDD (degenerative disc disease), lumbar 05/12/2019   Allergic rhinitis 05/11/2019   Primary osteoarthritis of right knee 03/20/2018   Insomnia 08/24/2017   Chronic back pain 10/16/2016   Pseudoarthrosis of lumbar spine 08/11/2015   Eczema 05/09/2015   Spinal stenosis of lumbar region 09/01/2014   Spondylolisthesis 08/03/2014   Displacement of lumbar intervertebral disc without myelopathy 08/03/2014    PCP: JValerie Roys DO   REFERRING PROVIDER: YMeade Maw MD   REFERRING DIAG:  [phone](ICD-10-CM) - Chronic left-sided low back pain with left-sided sciatica  S32.009K (ICD-10-CM) - Pseudoarthrosis of lumbar spine  M25.552 (ICD-10-CM) - Left hip pain    Rationale for Evaluation and Treatment: Rehabilitation  THERAPY DIAG:  No diagnosis found.  ONSET DATE: 07/21/2015  SUBJECTIVE:  SUBJECTIVE STATEMENT: ***  PERTINENT HISTORY:  Pt had laminectomy, fusion and 2 more fusions (more recent). Pt reports pain "never left" but she was unable to prioritize her pain management due to . 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   Pt localizes pain to her back and radiates down her right hip and LE. Pt reports the last time she saw the MD her left hip and groin  were very painful but it is feeling better now. Pt feels weak and shaky in her Romilda Joy and feels her core is very weak. Pt infers she may require surgical intervention if PT and hopes PT will help with her strength, tightness and pain prior to any required surgical procedure.    PAIN:  Are you having pain? Yes: NPRS scale: 7/10 Pain location: Lower back radiating to both sides and shooting on her R  Pain description: shooting, sharp, stabbing, aching Aggravating factors: prolonged positioning without movement  Relieving factors: movement, zero gravity position in bed, medicine, ice, heat rotating   PRECAUTIONS: None  WEIGHT BEARING RESTRICTIONS: No  FALLS:  Has patient fallen in last 6 months? Yes. Number of falls 2 Pt fell going upstairs when her leg gave out and another time going down the stairs.   LIVING ENVIRONMENT: Lives with: lives with their family and is primary caretaker for her parents and has a lab dog Lives in: House/apartment Stairs: Yes: Internal: 2 flights  steps; can reach both and External: 4 steps; on right going up Has following equipment at home: None  OCCUPATION: Pt works as team lead mostly working at desk where she is able to stand and sit.   PLOF: Independent and Pain associated with all activities but is able to tolerate and complete   PATIENT GOALS: core strength, leg strength  NEXT MD VISIT: None scheduled but will be scheduling following MRI results  OBJECTIVE:   DIAGNOSTIC FINDINGS:  Getting MRI 08/11/22  PATIENT SURVEYS:  Modified Oswestry 60  FOTO 43   SCREENING FOR RED FLAGS: Bowel or bladder incontinence: No Spinal tumors: No Cauda equina syndrome: No Compression fracture: No Abdominal aneurysm: No  COGNITION: Overall cognitive status: Within functional limits for tasks assessed     SENSATION: Light touch: Impaired  on L LE only but able to feel as lower level (ankle and lower calf)   MUSCLE LENGTH: Hamstrings: Right 28 deg; Left 45  deg from full extension  Thomas test: Right 10 deg; Left 40 deg pain limiting   POSTURE: weight shift left  PALPATION: Complete session 2 if pt tolerates.   LUMBAR ROM:   AROM eval  Flexion To knees   Extension No extension tolerable ( moves knees to flexion_   Right lateral flexion To knee  Left lateral flexion Mid thigh   Right rotation 30  Left rotation 26   (Blank rows = not tested)  LOWER EXTREMITY ROM:       Right eval Left eval  Hip flexion  Limited due to pain   Hip extension    Hip abduction    Hip adduction    Hip internal rotation    Hip external rotation    Knee flexion WNL WNL  Knee extension WNL WNL  Ankle dorsiflexion    Ankle plantarflexion    Ankle inversion    Ankle eversion     (Blank rows = not tested)  LOWER EXTREMITY MMT:  MMT Right eval Left eval  Hip flexion 3+ 3+  Hip extension    Hip abduction 4* 4*  Hip adduction 4* 4*  Hip internal rotation    Hip external rotation    Knee flexion 3+ 3+  Knee extension 3+ 3+  Ankle dorsiflexion 4 4  Ankle plantarflexion 4 4  Ankle inversion    Ankle eversion 4 4   (Blank rows = not tested) - Trembling noted in LE with all strength testing  -Asterix indicate pain limited strength -All tested in seated position   LUMBAR SPECIAL TESTS:  Straight leg raise test: Positive, Quadrant test: Positive, FABER test: Negative, and Thomas test: Positive FADIR positive Bilateral with hip and back pain Positive SLR bilateral with more intensity and more sensitive on the left compared to the right   FUNCTIONAL TESTS:  5 times sit to stand: 26.82 sec Pain noted with 5 times sit to stand test GAIT: Distance walked: 60 feet  Assistive device utilized: None Level of assistance: Complete Independence Comments: knee flexion on right throughout gait, unable to achieve heel strike on this side.   TODAY'S TREATMENT:                                                                                                                               DATE: 08/14/22   Exercise/Activity Sets/Reps/Time/ Resistance Assistance Charge type Comments  Hip strength       Knee strength       Hamstring and piriformis       IATSM to gluteal and paraspinals if indicated       Supine TrA activation                                           Treatment Provided this session   Rationale for Evaluation and Treatment Rehabilitation  Pt educated throughout session about proper posture and technique with exercises. Improved exercise technique, movement at target joints, use of target muscles after min to mod verbal, visual, tactile cues. Note: Portions of this document were prepared using Dragon voice recognition software and although reviewed may contain unintentional dictation errors in syntax, grammar, or spelling.    PATIENT EDUCATION:  Education details: POC, expectations with therapy  Person educated: Patient Education method: Explanation Education comprehension: verbalized understanding  HOME EXERCISE PROGRAM: To begin visit 2, consider piriformis stretch, HS stretch in most tolerated position, and TrA activation for core strength, LE strengthening as pain tolerates.   ASSESSMENT:  CLINICAL IMPRESSION: Patient is a 51 year old female who was seen for physical therapy evaluation for chronic low back pain.  Patient presents with several positive findings for lower extremity and core weakness as well as for nerve involvement as evidenced by positive straight leg raise test.  Patient has significant weakness in her bilateral lower extremities as well as impaired sensation globally  on the left side greater than the right side with light touch.  Patient did report during assessment that several stretches were slightly relieving with did not create long-lasting relief of her pain.  Patient will benefit from skilled physical therapy interventions in order to improve her lower extremity strength to prevent falls and her  legs giving out, improve her low back and nerve related pain, and improve for her strength in her core musculature in order to provide some relief for her low back.  OBJECTIVE IMPAIRMENTS: Abnormal gait, decreased activity tolerance, decreased mobility, difficulty walking, decreased strength, impaired flexibility, impaired sensation, and pain.   ACTIVITY LIMITATIONS: carrying, lifting, bending, sitting, standing, squatting, stairs, and bed mobility  PARTICIPATION LIMITATIONS: cleaning, laundry, driving, shopping, community activity, and yard work  PERSONAL FACTORS: Age and Time since onset of injury/illness/exacerbation are also affecting patient's functional outcome.   REHAB POTENTIAL: Fair Chronicity and pending MRI results for potential surgical intervention   CLINICAL DECISION MAKING: Stable/uncomplicated  EVALUATION COMPLEXITY: Low   GOALS: Goals reviewed with patient? Yes  SHORT TERM GOALS: Target date: 09/06/2022      Patient will be independent in home exercise program to improve strength/mobility for better functional independence with ADLs. Baseline: No HEP currently  Goal status: INITIAL  LONG TERM GOALS: Target date: 10/04/2022    Patient will improve focus on therapeutic outcome score by 7 points or more indicating improved self-reported pain levels of various activities Baseline: 43 Goal status: INITIAL  2.  Patient will improve modified Oswestry stability index score by 10% or greater indicating improved self-reported pain levels with various activities Baseline: 60 Goal status: INITIAL  3.  Patient will improve bilateral lower extremity strength to 4+ out of 5 or greater with all major planes in knee and hip strength tested in seated positioning Baseline: See eval charts Goal status: INITIAL  4.  Patient will improve supine straight leg raise test on the left side to equal distance to the right number to indicate improved neural tension on her left lower  extremity. Baseline: Left lower extremity pain exacerbation in low back and posterior leg very quick to cause pain with supine straight leg raise test Goal status: INITIAL  5.  Patient will improve 5 times sit to stand test to 18 seconds or less indicating improved lower extremity strength and stability as well as improved lower extremity power Baseline: 26.8 sec Goal status: INITIAL  PLAN:  PT FREQUENCY: 2x/week  PT DURATION: 8 weeks  PLANNED INTERVENTIONS: Therapeutic exercises, Therapeutic activity, Neuromuscular re-education, Balance training, Gait training, Patient/Family education, Self Care, Joint mobilization, Stair training, Spinal mobilization, Cryotherapy, Moist heat, Traction, and Dry needling  .  PLAN FOR NEXT SESSION: HEP for LE strength and stretches for relief of muscular tightness with focus on not exacerbation pain with the exercises.    Particia Lather, PT 08/14/2022, 8:14 AM

## 2022-08-15 ENCOUNTER — Ambulatory Visit: Payer: 59 | Admitting: Physical Therapy

## 2022-08-16 ENCOUNTER — Ambulatory Visit: Payer: Commercial Managed Care - PPO | Admitting: Physical Therapy

## 2022-08-21 ENCOUNTER — Ambulatory Visit: Payer: 59 | Admitting: Physical Therapy

## 2022-08-23 ENCOUNTER — Encounter: Payer: Self-pay | Admitting: Physical Therapy

## 2022-08-28 ENCOUNTER — Encounter: Payer: Self-pay | Admitting: Physical Therapy

## 2022-08-28 ENCOUNTER — Ambulatory Visit: Payer: 59 | Attending: Neurosurgery | Admitting: Physical Therapy

## 2022-08-28 ENCOUNTER — Other Ambulatory Visit (HOSPITAL_COMMUNITY): Payer: Self-pay

## 2022-08-28 DIAGNOSIS — R2681 Unsteadiness on feet: Secondary | ICD-10-CM | POA: Diagnosis not present

## 2022-08-28 DIAGNOSIS — M6281 Muscle weakness (generalized): Secondary | ICD-10-CM | POA: Diagnosis not present

## 2022-08-28 DIAGNOSIS — R269 Unspecified abnormalities of gait and mobility: Secondary | ICD-10-CM | POA: Insufficient documentation

## 2022-08-28 DIAGNOSIS — R262 Difficulty in walking, not elsewhere classified: Secondary | ICD-10-CM | POA: Diagnosis not present

## 2022-08-28 DIAGNOSIS — R2689 Other abnormalities of gait and mobility: Secondary | ICD-10-CM | POA: Insufficient documentation

## 2022-08-28 DIAGNOSIS — M5459 Other low back pain: Secondary | ICD-10-CM | POA: Insufficient documentation

## 2022-08-28 NOTE — Therapy (Signed)
OUTPATIENT PHYSICAL THERAPY THORACOLUMBAR NOTE   Patient Name: Jamie [name] MRN: [id number] DOB:12-May-1971, 52 y.o., female Today's Date: 08/28/2022  END OF SESSION:  PT End of Session - 08/28/22 1442     Visit Number 3    Number of Visits 16    Date for PT Re-Evaluation 10/04/22    Authorization Type [facility] 25 visits then medical review necessary for more visits.    Progress Note Due on Visit 10    PT Start Time 1349    PT Stop Time 1430    PT Time Calculation (min) 41 min    Equipment Utilized During Treatment Gait belt    Activity Tolerance Patient tolerated treatment well;Patient limited by pain    Behavior During Therapy WFL for tasks assessed/performed               Past Medical History:  Diagnosis Date   Anxiety    takes Ativan daily.  Panic Attack   Arthritis    Chronic back pain    herniated disc/stenosis/scoliosis   Depression    takes Effexor daily   Eczema    uses a cream daily as needed   Family history of adverse reaction to anesthesia    pta dad is very hard to wake up;excessive nausea   History of bronchitis    > 58yr[age]   Hx of dysplastic nevus 05/10/2020   neck - posterior, Severe Atypia. Shave removal 07/18/2020, margins free   Hypoglycemia    Insomnia    takes Lorazepam nightly   Joint pain    PONV (postoperative nausea and vomiting)    Seasonal allergies    uses Flonase daily   Torn rotator cuff    Vitamin D deficiency    takes Vit D daily   Wears contact lenses    Past Surgical History:  Procedure Laterality Date   BACK SURGERY  2008/2012   laminectomy 1st time and 2nd time laminectomy and fusion   COLONOSCOPY WITH PROPOFOL N/A 05/27/2020   Procedure: COLONOSCOPY WITH PROPOFOL;  Surgeon: TVirgel Manifold MD;  Location: [facility];  Service: Gastroenterology;  Laterality: N/A;  COVID POSITIVE AUGUST 16   MAXIMUM ACCESS (MAS)POSTERIOR LUMBAR INTERBODY FUSION (PLIF) 2 LEVEL N/A 09/01/2014   Procedure: Lumbar four-five,  Lumbar five-Sacral one Maximum Access Surgery posterior lumbar interbody fusion with interbody prosthesis posterior lateral arthrodesis posterior segmental instrumentation;  Surgeon: GElaina Hoops MD;  [facility];  Service: Neurosurgery;  Laterality: N/A;  Lumbar four-five, Lumbar five-Sacral one Maximum Access Surgery posterior lumbar interbody fusion with i   SHOULDER ARTHROSCOPY WITH SUBACROMIAL DECOMPRESSION AND OPEN ROTATOR C Right 08/18/2021   Procedure: Right shoulder arthroscopic rotator cuff repair, biceps tenodesis, subacromial decompression;  Surgeon: PLeim Fabry MD;  [facility]  Service: Orthopedics;  Laterality: Right;   Patient Active Problem List   Diagnosis Date Noted   Generalized body aches 06/22/2022   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, unspecified ([facility] 02/23/2022   Recurrent major depression-severe ([facility] 10/19/2020   GAD (generalized anxiety disorder) 04/26/2020   Panic attack 04/26/2020   At risk for long QT syndrome 04/26/2020   DDD (degenerative disc disease), lumbar 05/12/2019   Allergic rhinitis 05/11/2019   Primary osteoarthritis of right knee 03/20/2018   Insomnia 08/24/2017   Chronic back pain 10/16/2016   Pseudoarthrosis of lumbar spine 08/11/2015   Eczema 05/09/2015   Spinal stenosis of lumbar region 09/01/2014   Spondylolisthesis 08/03/2014   Displacement of lumbar intervertebral disc without myelopathy 08/03/2014  PCP: Valerie Roys, DO   REFERRING PROVIDER: Meade Maw, MD   REFERRING DIAG:  [phone] (ICD-10-CM) - Chronic left-sided low back pain with left-sided sciatica  S32.009K (ICD-10-CM) - Pseudoarthrosis of lumbar spine  M25.552 (ICD-10-CM) - Left hip pain    Rationale for Evaluation and Treatment: Rehabilitation  THERAPY DIAG:  Other low back pain  Difficulty in walking, not elsewhere classified  Other abnormalities of gait and mobility  Abnormality of gait and mobility  Unsteadiness on  feet  Muscle weakness (generalized)  ONSET DATE: 07/21/2015  SUBJECTIVE:                                                                                                                                                                                           SUBJECTIVE STATEMENT: Pt not feeling great today. Thinks the weather might be contributing to worsened symptoms. She rates her back pain as 6-7/10  PERTINENT HISTORY:  Pt had laminectomy, fusion and 2 more fusions (more recent). Pt reports pain "never left" but she was unable to prioritize her pain management due to . 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   Pt localizes pain to her back and radiates down her right hip and LE. Pt reports the last time she saw the MD her left hip and groin were very painful but it is feeling better now. Pt feels weak and shaky in her Romilda Joy and feels her core is very weak. Pt infers she may require surgical intervention if PT and hopes PT will help with her strength, tightness and pain prior to any required surgical procedure.   PAIN:  Are you having pain? Yes: NPRS scale: 7/10 Pain location: Lower back radiating to both sides and shooting on her R  Pain description: shooting, sharp, stabbing, aching Aggravating factors: prolonged positioning without movement  Relieving factors: movement, zero gravity position in bed, medicine, ice, heat rotating   PRECAUTIONS: None  WEIGHT BEARING RESTRICTIONS: No  FALLS:  Has patient fallen in last 6 months? Yes. Number of falls 2 Pt fell going upstairs when her leg gave out and another time going down the stairs.   LIVING ENVIRONMENT: Lives with: lives with their family and is primary caretaker for her parents and has a lab dog Lives in: House/apartment Stairs: Yes: Internal: 2 flights  steps; can reach both and External: 4 steps; on right going up Has following equipment at home:  None  OCCUPATION: Pt works as team lead mostly working at desk where she is able to stand and sit.  PLOF: Independent and Pain associated with all activities but is able to tolerate and complete   PATIENT GOALS: core strength, leg strength  NEXT MD VISIT: None scheduled but will be scheduling following MRI results  OBJECTIVE: from eval unless specified otherwise  DIAGNOSTIC FINDINGS:  Getting MRI 08/11/22  PATIENT SURVEYS:  Modified Oswestry 60  FOTO 43   SCREENING FOR RED FLAGS: Bowel or bladder incontinence: No Spinal tumors: No Cauda equina syndrome: No Compression fracture: No Abdominal aneurysm: No  COGNITION: Overall cognitive status: Within functional limits for tasks assessed     SENSATION: Light touch: Impaired  on L LE only but able to feel as lower level (ankle and lower calf)   MUSCLE LENGTH: Hamstrings: Right 28 deg; Left 45 deg from full extension  Thomas test: Right 10 deg; Left 40 deg pain limiting   POSTURE: weight shift left  PALPATION: Complete session 2 if pt tolerates.   LUMBAR ROM:   AROM eval  Flexion To knees   Extension No extension tolerable ( moves knees to flexion_   Right lateral flexion To knee  Left lateral flexion Mid thigh   Right rotation 30  Left rotation 26   (Blank rows = not tested)  LOWER EXTREMITY ROM:       Right eval Left eval  Hip flexion  Limited due to pain   Hip extension    Hip abduction    Hip adduction    Hip internal rotation    Hip external rotation    Knee flexion WNL WNL  Knee extension WNL WNL  Ankle dorsiflexion    Ankle plantarflexion    Ankle inversion    Ankle eversion     (Blank rows = not tested)  LOWER EXTREMITY MMT:    MMT Right eval Left eval  Hip flexion 3+ 3+  Hip extension    Hip abduction 4* 4*  Hip adduction 4* 4*  Hip internal rotation    Hip external rotation    Knee flexion 3+ 3+  Knee extension 3+ 3+  Ankle dorsiflexion 4 4  Ankle plantarflexion 4 4  Ankle  inversion    Ankle eversion 4 4   (Blank rows = not tested) - Trembling noted in LE with all strength testing  -Asterix indicate pain limited strength -All tested in seated position   LUMBAR SPECIAL TESTS:  Straight leg raise test: Positive, Quadrant test: Positive, FABER test: Negative, and Thomas test: Positive FADIR positive Bilateral with hip and back pain Positive SLR bilateral with more intensity and more sensitive on the left compared to the right   FUNCTIONAL TESTS:  5 times sit to stand: 26.82 sec Pain noted with 5 times sit to stand test GAIT: Distance walked: 60 feet  Assistive device utilized: None Level of assistance: Complete Independence Comments: knee flexion on right throughout gait, unable to achieve heel strike on this side.   TODAY'S TREATMENT:  DATE: 08/28/22  On mat table  Supine knee to chect and knee circumduction stretching, some hip discomfort but no back pain x 45 sec hods and x 5 circles CCW and CW   LTRs x 10 each side within pain-free ROM- needed heat this date due to pain with this activity   TrA activation in hooklye 15x 5 sec holds. Cuing for technique with additional tactile feedback. Mild pain initially on L side low back but then reported pain back at baseline  RTB hooklye hip abd/er 2x10 BLE (reports no latex allergy)  Supine SAQ 2 x 10 with 3# AW- reports feels good with no pain   Hooklye adductor squeeze with pball 2x10x5 sec holds/rep  Hooklye Piriformis stretch 45 sec each LE, and additional 45 sec LLE only. LLE felt OK but RLE side is pain-limited  P-ball roll outs 10 x 5 sec holds  Seated hamstring stretch stretch 30 sec each LE within pain-free range    PATIENT EDUCATION:  Education details: Pt educated throughout session about proper posture and technique with exercises. Improved exercise technique,  movement at target joints, use of target muscles after min to mod verbal, visual, tactile cues. HEP HEP  Person educated: Patient Education method: Explanation, Demonstration, Tactile cues, Verbal cues, and Handouts Education comprehension: verbalized understanding, returned demonstration, and needs further education  HOME EXERCISE PROGRAM: Access Code: [email or web address] URL: [email or web address] Date: 08/14/2022 Prepared by: Ricard Dillon  Exercises - Supine Transversus Abdominis Bracing - Hands on Stomach  - 1 x daily - 7 x weekly - 2 sets - 10 reps - 5 second  hold - Supine Quad Set  - 1 x daily - 7 x weekly - 2 sets - 10 reps - 5 sec  hold - Clamshell  - 1 x daily - 7 x weekly - 2 sets - 10 reps - Supine Hip Adduction Isometric with Ball  - 1 x daily - 7 x weekly - 2 sets - 10 reps - 5 seocnd hold - Hooklying Clamshell with Resistance  - 1 x daily - 7 x weekly - 2 sets - 10 reps  ASSESSMENT:  CLINICAL IMPRESSION:  Pt presents with good motivation for completion of PT activities. Pt continues with LE strengthening, core activation and LE mobility training. Pt  responds well to heat applied to low back with decreased level of pain following application. Pt progressed with LE strength with resistance and reps this date. Pt continuing to complete HEP as prescribed. Pt will continue to benefit from skilled physical therapy intervention to address impairments, improve QOL, and attain therapy goals.     OBJECTIVE IMPAIRMENTS: Abnormal gait, decreased activity tolerance, decreased mobility, difficulty walking, decreased strength, impaired flexibility, impaired sensation, and pain.   ACTIVITY LIMITATIONS: carrying, lifting, bending, sitting, standing, squatting, stairs, and bed mobility  PARTICIPATION LIMITATIONS: cleaning, laundry, driving, shopping, community activity, and yard work  PERSONAL FACTORS: Age and Time since onset of injury/illness/exacerbation are also affecting  patient's functional outcome.   REHAB POTENTIAL: Fair Chronicity and pending MRI results for potential surgical intervention   CLINICAL DECISION MAKING: Stable/uncomplicated  EVALUATION COMPLEXITY: Low   GOALS: Goals reviewed with patient? Yes  SHORT TERM GOALS: Target date: 09/06/2022      Patient will be independent in home exercise program to improve strength/mobility for better functional independence with ADLs. Baseline: No HEP currently  Goal status: INITIAL  LONG TERM GOALS: Target date: 10/04/2022    Patient will improve focus on therapeutic outcome score by 7 points or  more indicating improved self-reported pain levels of various activities Baseline: 43 Goal status: INITIAL  2.  Patient will improve modified Oswestry stability index score by 10% or greater indicating improved self-reported pain levels with various activities Baseline: 60 Goal status: INITIAL  3.  Patient will improve bilateral lower extremity strength to 4+ out of 5 or greater with all major planes in knee and hip strength tested in seated positioning Baseline: See eval charts Goal status: INITIAL  4.  Patient will improve supine straight leg raise test on the left side to equal distance to the right number to indicate improved neural tension on her left lower extremity. Baseline: Left lower extremity pain exacerbation in low back and posterior leg very quick to cause pain with supine straight leg raise test Goal status: INITIAL  5.  Patient will improve 5 times sit to stand test to 18 seconds or less indicating improved lower extremity strength and stability as well as improved lower extremity power Baseline: 26.8 sec Goal status: INITIAL  PLAN:  PT FREQUENCY: 2x/week  PT DURATION: 8 weeks  PLANNED INTERVENTIONS: Therapeutic exercises, Therapeutic activity, Neuromuscular re-education, Balance training, Gait training, Patient/Family education, Self Care, Joint mobilization, Stair training,  Spinal mobilization, Cryotherapy, Moist heat, Traction, and Dry needling  .  PLAN FOR NEXT SESSION: LE strength and stretches for relief of muscular tightness with focus on not exacerbation pain with the exercises. Continue plan    Particia Lather, PT 08/28/2022, 2:48 PM

## 2022-08-30 ENCOUNTER — Other Ambulatory Visit (HOSPITAL_COMMUNITY): Payer: Self-pay

## 2022-08-30 ENCOUNTER — Ambulatory Visit: Payer: 59 | Admitting: Physical Therapy

## 2022-08-30 DIAGNOSIS — M961 Postlaminectomy syndrome, not elsewhere classified: Secondary | ICD-10-CM | POA: Diagnosis not present

## 2022-08-30 DIAGNOSIS — M542 Cervicalgia: Secondary | ICD-10-CM | POA: Diagnosis not present

## 2022-08-30 DIAGNOSIS — F1120 Opioid dependence, uncomplicated: Secondary | ICD-10-CM | POA: Diagnosis not present

## 2022-08-30 DIAGNOSIS — Z6826 Body mass index (BMI) 26.0-26.9, adult: Secondary | ICD-10-CM | POA: Diagnosis not present

## 2022-09-03 ENCOUNTER — Other Ambulatory Visit: Payer: Self-pay

## 2022-09-04 ENCOUNTER — Ambulatory Visit: Payer: 59

## 2022-09-06 ENCOUNTER — Encounter: Payer: Self-pay | Admitting: Physical Therapy

## 2022-09-06 ENCOUNTER — Ambulatory Visit: Payer: 59 | Admitting: Physical Therapy

## 2022-09-06 DIAGNOSIS — M6281 Muscle weakness (generalized): Secondary | ICD-10-CM | POA: Diagnosis not present

## 2022-09-06 DIAGNOSIS — R262 Difficulty in walking, not elsewhere classified: Secondary | ICD-10-CM | POA: Diagnosis not present

## 2022-09-06 DIAGNOSIS — R2689 Other abnormalities of gait and mobility: Secondary | ICD-10-CM

## 2022-09-06 DIAGNOSIS — R269 Unspecified abnormalities of gait and mobility: Secondary | ICD-10-CM | POA: Diagnosis not present

## 2022-09-06 DIAGNOSIS — M5459 Other low back pain: Secondary | ICD-10-CM | POA: Diagnosis not present

## 2022-09-06 DIAGNOSIS — R2681 Unsteadiness on feet: Secondary | ICD-10-CM

## 2022-09-06 NOTE — Therapy (Signed)
OUTPATIENT PHYSICAL THERAPY THORACOLUMBAR NOTE   Patient Name: Jamie [name] MRN: [id number] DOB:Jan 01, 1971, 52 y.o., female Today's Date: 09/06/2022  END OF SESSION:  PT End of Session - 09/06/22 1051     Visit Number 4    Number of Visits 16    Date for PT Re-Evaluation 10/04/22    Authorization Type [facility] 25 visits then medical review necessary for more visits.    Progress Note Due on Visit 10    Equipment Utilized During Treatment Gait belt    Activity Tolerance Patient tolerated treatment well;Patient limited by pain    Behavior During Therapy [facility] for tasks assessed/performed               Past Medical History:  Diagnosis Date   Anxiety    takes Ativan daily.  Panic Attack   Arthritis    Chronic back pain    herniated disc/stenosis/scoliosis   Depression    takes Effexor daily   Eczema    uses a cream daily as needed   Family history of adverse reaction to anesthesia    pta dad is very hard to wake up;excessive nausea   History of bronchitis    > 67yr[age]   Hx of dysplastic nevus 05/10/2020   neck - posterior, Severe Atypia. Shave removal 07/18/2020, margins free   Hypoglycemia    Insomnia    takes Lorazepam nightly   Joint pain    PONV (postoperative nausea and vomiting)    Seasonal allergies    uses Flonase daily   Torn rotator cuff    Vitamin D deficiency    takes Vit D daily   Wears contact lenses    Past Surgical History:  Procedure Laterality Date   BACK SURGERY  2008/2012   laminectomy 1st time and 2nd time laminectomy and fusion   COLONOSCOPY WITH PROPOFOL N/A 05/27/2020   Procedure: COLONOSCOPY WITH PROPOFOL;  Surgeon: TVirgel Manifold MD;  Location: [facility] ENDOSCOPY;  Service: Gastroenterology;  Laterality: N/A;  COVID POSITIVE AUGUST 16   MAXIMUM ACCESS (MAS)POSTERIOR LUMBAR INTERBODY FUSION (PLIF) 2 LEVEL N/A 09/01/2014   Procedure: Lumbar four-five, Lumbar five-Sacral one Maximum Access Surgery posterior lumbar interbody fusion with  interbody prosthesis posterior lateral arthrodesis posterior segmental instrumentation;  Surgeon: GElaina Hoops MD;  [facility];  Service: Neurosurgery;  Laterality: N/A;  Lumbar four-five, Lumbar five-Sacral one Maximum Access Surgery posterior lumbar interbody fusion with i   SHOULDER ARTHROSCOPY WITH SUBACROMIAL DECOMPRESSION AND OPEN ROTATOR C Right 08/18/2021   Procedure: Right shoulder arthroscopic rotator cuff repair, biceps tenodesis, subacromial decompression;  Surgeon: PLeim Fabry MD;  [facility]  Service: Orthopedics;  Laterality: Right;   Patient Active Problem List   Diagnosis Date Noted   Generalized body aches 06/22/2022   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, unspecified ([place] 02/23/2022   [place] 10/19/2020   GAD (generalized anxiety disorder) 04/26/2020   Panic attack 04/26/2020   At risk for long QT syndrome 04/26/2020   DDD (degenerative disc disease), lumbar 05/12/2019   Allergic rhinitis 05/11/2019   Primary osteoarthritis of right knee 03/20/2018   Insomnia 08/24/2017   Chronic back pain 10/16/2016   Pseudoarthrosis of lumbar spine 08/11/2015   Eczema 05/09/2015   Spinal stenosis of lumbar region 09/01/2014   Spondylolisthesis 08/03/2014   Displacement of lumbar intervertebral disc without myelopathy 08/03/2014    PCP: JValerie Roys DO   REFERRING PROVIDER: YMeade Maw MD   REFERRING DIAG:  [id number](ICD-10-CM) - Chronic  left-sided low back pain with left-sided sciatica  S32.009K (ICD-10-CM) - Pseudoarthrosis of lumbar spine  M25.552 (ICD-10-CM) - Left hip pain    Rationale for Evaluation and Treatment: Rehabilitation  THERAPY DIAG:  No diagnosis found.  ONSET DATE: 07/21/2015  SUBJECTIVE:                                                                                                                                                                                           SUBJECTIVE  STATEMENT: Pt feeling better today but having significant pain yesterday. Pt has 5/10 pain today but had 9/10 pain yesterday that was unrelenting.   PERTINENT HISTORY:  Pt had laminectomy, fusion and 2 more fusions (more recent). Pt reports pain "never left" but she was unable to prioritize her pain management due to . 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   Pt localizes pain to her back and radiates down her right hip and LE. Pt reports the last time she saw the MD her left hip and groin were very painful but it is feeling better now. Pt feels weak and shaky in her Romilda Joy and feels her core is very weak. Pt infers she may require surgical intervention if PT and hopes PT will help with her strength, tightness and pain prior to any required surgical procedure.   PAIN:  Are you having pain? Yes: NPRS scale: 5/10 Pain location: Lower back radiating to both sides and shooting on her R  Pain description: shooting, sharp, stabbing, aching Aggravating factors: prolonged positioning without movement  Relieving factors: movement, zero gravity position in bed, medicine, ice, heat rotating   PRECAUTIONS: None  WEIGHT BEARING RESTRICTIONS: No  FALLS:  Has patient fallen in last 6 months? Yes. Number of falls 2 Pt fell going upstairs when her leg gave out and another time going down the stairs.   LIVING ENVIRONMENT: Lives with: lives with their family and is primary caretaker for her parents and has a lab dog Lives in: House/apartment Stairs: Yes: Internal: 2 flights  steps; can reach both and External: 4 steps; on right going up Has following equipment at home: None  OCCUPATION: Pt works as team lead mostly working at desk where she is able to stand and sit.   PLOF: Independent and Pain associated with all activities but is able to tolerate and complete   PATIENT GOALS: core strength, leg strength  NEXT MD VISIT: None  scheduled but will be scheduling following MRI results  OBJECTIVE: from eval unless specified otherwise  DIAGNOSTIC FINDINGS:  Getting MRI 08/11/22  PATIENT SURVEYS:  Modified Oswestry 60  FOTO 43   SCREENING FOR RED FLAGS: Bowel or bladder incontinence: No Spinal tumors: No Cauda equina syndrome: No Compression fracture: No Abdominal aneurysm: No  COGNITION: Overall cognitive status: Within functional limits for tasks assessed     SENSATION: Light touch: Impaired  on L LE only but able to feel as lower level (ankle and lower calf)   MUSCLE LENGTH: Hamstrings: Right 28 deg; Left 45 deg from full extension  Thomas test: Right 10 deg; Left 40 deg pain limiting   POSTURE: weight shift left  PALPATION: Complete session 2 if pt tolerates.   LUMBAR ROM:   AROM eval  Flexion To knees   Extension No extension tolerable ( moves knees to flexion_   Right lateral flexion To knee  Left lateral flexion Mid thigh   Right rotation 30  Left rotation 26   (Blank rows = not tested)  LOWER EXTREMITY ROM:       Right eval Left eval  Hip flexion  Limited due to pain   Hip extension    Hip abduction    Hip adduction    Hip internal rotation    Hip external rotation    Knee flexion WNL WNL  Knee extension WNL WNL  Ankle dorsiflexion    Ankle plantarflexion    Ankle inversion    Ankle eversion     (Blank rows = not tested)  LOWER EXTREMITY MMT:    MMT Right eval Left eval  Hip flexion 3+ 3+  Hip extension    Hip abduction 4* 4*  Hip adduction 4* 4*  Hip internal rotation    Hip external rotation    Knee flexion 3+ 3+  Knee extension 3+ 3+  Ankle dorsiflexion 4 4  Ankle plantarflexion 4 4  Ankle inversion    Ankle eversion 4 4   (Blank rows = not tested) - Trembling noted in LE with all strength testing  -Asterix indicate pain limited strength -All tested in seated position   LUMBAR SPECIAL TESTS:  Straight leg raise test: Positive, Quadrant test:  Positive, FABER test: Negative, and Thomas test: Positive FADIR positive Bilateral with hip and back pain Positive SLR bilateral with more intensity and more sensitive on the left compared to the right   FUNCTIONAL TESTS:  5 times sit to stand: 26.82 sec Pain noted with 5 times sit to stand test GAIT: Distance walked: 60 feet  Assistive device utilized: None Level of assistance: Complete Independence Comments: knee flexion on right throughout gait, unable to achieve heel strike on this side.   TODAY'S TREATMENT:                                                                                                                              DATE: 09/06/22  On mat table  Supine knee to chect and knee circumduction stretching, some hip discomfort but no back pain x  45 sec hods and x 5 circles CCW and CW   LTRs x 10 each side within pain-free ROM- needed heat this date due to pain with this activity   TrA activation in hooklye 15x 5 sec holds. Cuing for technique with additional tactile feedback. Mild pain initially on L side low back but then reported pain back at baseline  Supine SAQ 2 x 12 with 4# AW- reports feels good with no pain   Hooklye adductor squeeze with pball 2x10x5 sec holds/rep   Side lying clamshell 2 x 10 on ea side, increased difficulty on the L>R LE this date but still able to complete   P-ball roll outs 8 x 5 sec holds, tightness felt in hips and discomfort in left hip so exercise was stopped early as a result  Seated transverse abdominis muscle activation 15 repetitions x 5-second holds      PATIENT EDUCATION:  Education details: Pt educated throughout session about proper posture and technique with exercises. Improved exercise technique, movement at target joints, use of target muscles after min to mod verbal, visual, tactile cues. HEP HEP  Person educated: Patient Education method: Explanation, Demonstration, Tactile cues, Verbal cues, and Handouts Education  comprehension: verbalized understanding, returned demonstration, and needs further education  HOME EXERCISE PROGRAM: Access Code: [email or web address] URL: [email or web address] Date: 08/14/2022 Prepared by: Ricard Dillon  Exercises - Supine Transversus Abdominis Bracing - Hands on Stomach  - 1 x daily - 7 x weekly - 2 sets - 10 reps - 5 second  hold - Supine Quad Set  - 1 x daily - 7 x weekly - 2 sets - 10 reps - 5 sec  hold - Clamshell  - 1 x daily - 7 x weekly - 2 sets - 10 reps - Supine Hip Adduction Isometric with Ball  - 1 x daily - 7 x weekly - 2 sets - 10 reps - 5 seocnd hold - Hooklying Clamshell with Resistance  - 1 x daily - 7 x weekly - 2 sets - 10 reps  ASSESSMENT:  CLINICAL IMPRESSION:  Pt presents with good motivation for completion of PT activities. Pt continues with LE strengthening, core activation and LE mobility training.  Pt progressed with LE strength with resistance and reps this date.  Patient is able to perform long arc quad with ankle in neutral position indicating improved bilateral lower extremity neural tension this date.  Pt continuing to complete HEP as prescribed. Pt will continue to benefit from skilled physical therapy intervention to address impairments, improve QOL, and attain therapy goals.     OBJECTIVE IMPAIRMENTS: Abnormal gait, decreased activity tolerance, decreased mobility, difficulty walking, decreased strength, impaired flexibility, impaired sensation, and pain.   ACTIVITY LIMITATIONS: carrying, lifting, bending, sitting, standing, squatting, stairs, and bed mobility  PARTICIPATION LIMITATIONS: cleaning, laundry, driving, shopping, community activity, and yard work  PERSONAL FACTORS: Age and Time since onset of injury/illness/exacerbation are also affecting patient's functional outcome.   REHAB POTENTIAL: Fair Chronicity and pending MRI results for potential surgical intervention   CLINICAL DECISION MAKING:  Stable/uncomplicated  EVALUATION COMPLEXITY: Low   GOALS: Goals reviewed with patient? Yes  SHORT TERM GOALS: Target date: 09/06/2022      Patient will be independent in home exercise program to improve strength/mobility for better functional independence with ADLs. Baseline: No HEP currently  Goal status: INITIAL  LONG TERM GOALS: Target date: 10/04/2022    Patient will improve focus on therapeutic outcome score by 7 points or more indicating improved self-reported pain  levels of various activities Baseline: 43 Goal status: INITIAL  2.  Patient will improve modified Oswestry stability index score by 10% or greater indicating improved self-reported pain levels with various activities Baseline: 60 Goal status: INITIAL  3.  Patient will improve bilateral lower extremity strength to 4+ out of 5 or greater with all major planes in knee and hip strength tested in seated positioning Baseline: See eval charts Goal status: INITIAL  4.  Patient will improve supine straight leg raise test on the left side to equal distance to the right number to indicate improved neural tension on her left lower extremity. Baseline: Left lower extremity pain exacerbation in low back and posterior leg very quick to cause pain with supine straight leg raise test Goal status: INITIAL  5.  Patient will improve 5 times sit to stand test to 18 seconds or less indicating improved lower extremity strength and stability as well as improved lower extremity power Baseline: 26.8 sec Goal status: INITIAL  PLAN:  PT FREQUENCY: 2x/week  PT DURATION: 8 weeks  PLANNED INTERVENTIONS: Therapeutic exercises, Therapeutic activity, Neuromuscular re-education, Balance training, Gait training, Patient/Family education, Self Care, Joint mobilization, Stair training, Spinal mobilization, Cryotherapy, Moist heat, Traction, and Dry needling  .  PLAN FOR NEXT SESSION: LE strength and stretches for relief of muscular  tightness with focus on not exacerbation pain with the exercises. Continue plan    Particia Lather, PT 09/06/2022, 10:51 AM

## 2022-09-11 ENCOUNTER — Telehealth (HOSPITAL_COMMUNITY): Payer: Self-pay | Admitting: *Deleted

## 2022-09-11 DIAGNOSIS — F410 Panic disorder [episodic paroxysmal anxiety] without agoraphobia: Secondary | ICD-10-CM

## 2022-09-11 NOTE — Telephone Encounter (Signed)
Pt called requesting a refill of the Ativan 0.5 mg tabs. Last visit and script completed on 08/07/23. #10 was sent to pharmacy. Pt states that she's under a lot of stress currently and her anxiety is very high. Please review and advise.

## 2022-09-12 NOTE — Therapy (Deleted)
OUTPATIENT PHYSICAL THERAPY THORACOLUMBAR NOTE   Patient Name: Jamie [name] MRN: [id number] DOB:04-03-1971, 52 y.o., female Today's Date: 09/12/2022  END OF SESSION:      Past Medical History:  Diagnosis Date   Anxiety    takes Ativan daily.  Panic Attack   Arthritis    Chronic back pain    herniated disc/stenosis/scoliosis   Depression    takes Effexor daily   Eczema    uses a cream daily as needed   Family history of adverse reaction to anesthesia    pta dad is very hard to wake up;excessive nausea   History of bronchitis    > 48yr[age]   Hx of dysplastic nevus 05/10/2020   neck - posterior, Severe Atypia. Shave removal 07/18/2020, margins free   Hypoglycemia    Insomnia    takes Lorazepam nightly   Joint pain    PONV (postoperative nausea and vomiting)    Seasonal allergies    uses Flonase daily   Torn rotator cuff    Vitamin D deficiency    takes Vit D daily   Wears contact lenses    Past Surgical History:  Procedure Laterality Date   BACK SURGERY  2008/2012   laminectomy 1st time and 2nd time laminectomy and fusion   COLONOSCOPY WITH PROPOFOL N/A 05/27/2020   Procedure: COLONOSCOPY WITH PROPOFOL;  Surgeon: TVirgel Manifold MD;  Location: [facility];  Service: Gastroenterology;  Laterality: N/A;  COVID POSITIVE AUGUST 16   MAXIMUM ACCESS (MAS)POSTERIOR LUMBAR INTERBODY FUSION (PLIF) 2 LEVEL N/A 09/01/2014   Procedure: Lumbar four-five, Lumbar five-Sacral one Maximum Access Surgery posterior lumbar interbody fusion with interbody prosthesis posterior lateral arthrodesis posterior segmental instrumentation;  Surgeon: GElaina Hoops MD;  [place] [facility];  Service: Neurosurgery;  Laterality: N/A;  Lumbar four-five, Lumbar five-Sacral one Maximum Access Surgery posterior lumbar interbody fusion with i   SHOULDER ARTHROSCOPY WITH SUBACROMIAL DECOMPRESSION AND OPEN ROTATOR C Right 08/18/2021   Procedure: Right shoulder arthroscopic rotator cuff repair,  biceps tenodesis, subacromial decompression;  Surgeon: PLeim Fabry MD;  [place] [facility]  Service: Orthopedics;  Laterality: Right;   Patient Active Problem List   Diagnosis Date Noted   Generalized body aches 06/22/2022   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, unspecified ([facility] 02/23/2022   Recurrent major depression-severe ([facility] 10/19/2020   GAD (generalized anxiety disorder) 04/26/2020   Panic attack 04/26/2020   At risk for long QT syndrome 04/26/2020   DDD (degenerative disc disease), lumbar 05/12/2019   Allergic rhinitis 05/11/2019   Primary osteoarthritis of right knee 03/20/2018   Insomnia 08/24/2017   Chronic back pain 10/16/2016   Pseudoarthrosis of lumbar spine 08/11/2015   Eczema 05/09/2015   Spinal stenosis of lumbar region 09/01/2014   Spondylolisthesis 08/03/2014   Displacement of lumbar intervertebral disc without myelopathy 08/03/2014    PCP: JValerie Roys DO   REFERRING PROVIDER: YMeade Maw MD   REFERRING DIAG:  [id number](ICD-10-CM) - Chronic left-sided low back pain with left-sided sciatica  S32.009K (ICD-10-CM) - Pseudoarthrosis of lumbar spine  M25.552 (ICD-10-CM) - Left hip pain    Rationale for Evaluation and Treatment: Rehabilitation  THERAPY DIAG:  Other low back pain  Difficulty in walking, not elsewhere classified  Other abnormalities of gait and mobility  Abnormality of gait and mobility  Unsteadiness on feet  ONSET DATE: 07/21/2015  SUBJECTIVE:  SUBJECTIVE STATEMENT: Pt feeling better today but having significant pain yesterday. Pt has 5/10 pain today but had 9/10 pain yesterday that was unrelenting.   PERTINENT HISTORY:  Pt had laminectomy, fusion and 2 more fusions (more recent). Pt reports pain "never left" but she was unable to  prioritize her pain management due to . 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   Pt localizes pain to her back and radiates down her right hip and LE. Pt reports the last time she saw the MD her left hip and groin were very painful but it is feeling better now. Pt feels weak and shaky in her Romilda Joy and feels her core is very weak. Pt infers she may require surgical intervention if PT and hopes PT will help with her strength, tightness and pain prior to any required surgical procedure.   PAIN:  Are you having pain? Yes: NPRS scale: 5/10 Pain location: Lower back radiating to both sides and shooting on her R  Pain description: shooting, sharp, stabbing, aching Aggravating factors: prolonged positioning without movement  Relieving factors: movement, zero gravity position in bed, medicine, ice, heat rotating   PRECAUTIONS: None  WEIGHT BEARING RESTRICTIONS: No  FALLS:  Has patient fallen in last 6 months? Yes. Number of falls 2 Pt fell going upstairs when her leg gave out and another time going down the stairs.   LIVING ENVIRONMENT: Lives with: lives with their family and is primary caretaker for her parents and has a lab dog Lives in: House/apartment Stairs: Yes: Internal: 2 flights  steps; can reach both and External: 4 steps; on right going up Has following equipment at home: None  OCCUPATION: Pt works as team lead mostly working at desk where she is able to stand and sit.   PLOF: Independent and Pain associated with all activities but is able to tolerate and complete   PATIENT GOALS: core strength, leg strength  NEXT MD VISIT: None scheduled but will be scheduling following MRI results  OBJECTIVE: from eval unless specified otherwise  DIAGNOSTIC FINDINGS:  Getting MRI 08/11/22  PATIENT SURVEYS:  Modified Oswestry 60  FOTO 43   SCREENING FOR RED FLAGS: Bowel or bladder incontinence: No Spinal tumors:  No Cauda equina syndrome: No Compression fracture: No Abdominal aneurysm: No  COGNITION: Overall cognitive status: Within functional limits for tasks assessed     SENSATION: Light touch: Impaired  on L LE only but able to feel as lower level (ankle and lower calf)   MUSCLE LENGTH: Hamstrings: Right 28 deg; Left 45 deg from full extension  Thomas test: Right 10 deg; Left 40 deg pain limiting   POSTURE: weight shift left  PALPATION: Complete session 2 if pt tolerates.   LUMBAR ROM:   AROM eval  Flexion To knees   Extension No extension tolerable ( moves knees to flexion_   Right lateral flexion To knee  Left lateral flexion Mid thigh   Right rotation 30  Left rotation 26   (Blank rows = not tested)  LOWER EXTREMITY ROM:       Right eval Left eval  Hip flexion  Limited due to pain   Hip extension    Hip abduction    Hip adduction    Hip internal rotation    Hip external rotation    Knee flexion WNL WNL  Knee extension WNL WNL  Ankle dorsiflexion    Ankle  plantarflexion    Ankle inversion    Ankle eversion     (Blank rows = not tested)  LOWER EXTREMITY MMT:    MMT Right eval Left eval  Hip flexion 3+ 3+  Hip extension    Hip abduction 4* 4*  Hip adduction 4* 4*  Hip internal rotation    Hip external rotation    Knee flexion 3+ 3+  Knee extension 3+ 3+  Ankle dorsiflexion 4 4  Ankle plantarflexion 4 4  Ankle inversion    Ankle eversion 4 4   (Blank rows = not tested) - Trembling noted in LE with all strength testing  -Asterix indicate pain limited strength -All tested in seated position   LUMBAR SPECIAL TESTS:  Straight leg raise test: Positive, Quadrant test: Positive, FABER test: Negative, and Thomas test: Positive FADIR positive Bilateral with hip and back pain Positive SLR bilateral with more intensity and more sensitive on the left compared to the right   FUNCTIONAL TESTS:  5 times sit to stand: 26.82 sec Pain noted with 5 times sit to  stand test GAIT: Distance walked: 60 feet  Assistive device utilized: None Level of assistance: Complete Independence Comments: knee flexion on right throughout gait, unable to achieve heel strike on this side.   TODAY'S TREATMENT:                                                                                                                              DATE: 09/12/22  On mat table  Supine knee to chect and knee circumduction stretching, some hip discomfort but no back pain x 45 sec hods and x 5 circles CCW and CW   LTRs x 10 each side within pain-free ROM- needed heat this date due to pain with this activity   TrA activation in hooklye 15x 5 sec holds. Cuing for technique with additional tactile feedback. Mild pain initially on L side low back but then reported pain back at baseline  Supine SAQ 2 x 12 with 4# AW- reports feels good with no pain   Hooklye adductor squeeze with pball 2x10x5 sec holds/rep   Side lying clamshell 2 x 10 on ea side, increased difficulty on the L>R LE this date but still able to complete   P-ball roll outs 8 x 5 sec holds, tightness felt in hips and discomfort in left hip so exercise was stopped early as a result  Seated transverse abdominis muscle activation 15 repetitions x 5-second holds      PATIENT EDUCATION:  Education details: Pt educated throughout session about proper posture and technique with exercises. Improved exercise technique, movement at target joints, use of target muscles after min to mod verbal, visual, tactile cues. HEP HEP  Person educated: Patient Education method: Explanation, Demonstration, Tactile cues, Verbal cues, and Handouts Education comprehension: verbalized understanding, returned demonstration, and needs further education  HOME EXERCISE PROGRAM: Access Code: [email or web address] URL: [email or web address] Date: 08/14/2022 Prepared by: Ricard Dillon  Exercises - Supine Transversus Abdominis Bracing - Hands on  Stomach  - 1 x daily - 7 x weekly - 2 sets - 10 reps - 5 second  hold - Supine Quad Set  - 1 x daily - 7 x weekly - 2 sets - 10 reps - 5 sec  hold - Clamshell  - 1 x daily - 7 x weekly - 2 sets - 10 reps - Supine Hip Adduction Isometric with Ball  - 1 x daily - 7 x weekly - 2 sets - 10 reps - 5 seocnd hold - Hooklying Clamshell with Resistance  - 1 x daily - 7 x weekly - 2 sets - 10 reps  ASSESSMENT:  CLINICAL IMPRESSION:  Pt presents with good motivation for completion of PT activities. Pt continues with LE strengthening, core activation and LE mobility training.  Pt progressed with LE strength with resistance and reps this date.  Patient is able to perform long arc quad with ankle in neutral position indicating improved bilateral lower extremity neural tension this date.  Pt continuing to complete HEP as prescribed. Pt will continue to benefit from skilled physical therapy intervention to address impairments, improve QOL, and attain therapy goals.     OBJECTIVE IMPAIRMENTS: Abnormal gait, decreased activity tolerance, decreased mobility, difficulty walking, decreased strength, impaired flexibility, impaired sensation, and pain.   ACTIVITY LIMITATIONS: carrying, lifting, bending, sitting, standing, squatting, stairs, and bed mobility  PARTICIPATION LIMITATIONS: cleaning, laundry, driving, shopping, community activity, and yard work  PERSONAL FACTORS: Age and Time since onset of injury/illness/exacerbation are also affecting patient's functional outcome.   REHAB POTENTIAL: Fair Chronicity and pending MRI results for potential surgical intervention   CLINICAL DECISION MAKING: Stable/uncomplicated  EVALUATION COMPLEXITY: Low   GOALS: Goals reviewed with patient? Yes  SHORT TERM GOALS: Target date: 09/06/2022      Patient will be independent in home exercise program to improve strength/mobility for better functional independence with ADLs. Baseline: No HEP currently  Goal status:  INITIAL  LONG TERM GOALS: Target date: 10/04/2022    Patient will improve focus on therapeutic outcome score by 7 points or more indicating improved self-reported pain levels of various activities Baseline: 43 Goal status: INITIAL  2.  Patient will improve modified Oswestry stability index score by 10% or greater indicating improved self-reported pain levels with various activities Baseline: 60 Goal status: INITIAL  3.  Patient will improve bilateral lower extremity strength to 4+ out of 5 or greater with all major planes in knee and hip strength tested in seated positioning Baseline: See eval charts Goal status: INITIAL  4.  Patient will improve supine straight leg raise test on the left side to equal distance to the right number to indicate improved neural tension on her left lower extremity. Baseline: Left lower extremity pain exacerbation in low back and posterior leg very quick to cause pain with supine straight leg raise test Goal status: INITIAL  5.  Patient will improve 5 times sit to stand test to 18 seconds or less indicating improved lower extremity strength and stability as well as improved lower extremity power Baseline: 26.8 sec Goal status: INITIAL  PLAN:  PT FREQUENCY: 2x/week  PT DURATION: 8 weeks  PLANNED INTERVENTIONS: Therapeutic exercises, Therapeutic activity, Neuromuscular re-education, Balance training, Gait training, Patient/Family education, Self Care, Joint mobilization, Stair training, Spinal mobilization, Cryotherapy, Moist heat, Traction, and Dry needling  .  PLAN FOR NEXT SESSION: LE strength and stretches for relief of muscular tightness with focus on not exacerbation  pain with the exercises. Continue plan    Particia Lather, PT 09/12/2022, 4:22 PM

## 2022-09-13 ENCOUNTER — Ambulatory Visit: Payer: 59 | Admitting: Physical Therapy

## 2022-09-13 DIAGNOSIS — M5459 Other low back pain: Secondary | ICD-10-CM

## 2022-09-13 DIAGNOSIS — R2689 Other abnormalities of gait and mobility: Secondary | ICD-10-CM

## 2022-09-13 DIAGNOSIS — R269 Unspecified abnormalities of gait and mobility: Secondary | ICD-10-CM

## 2022-09-13 DIAGNOSIS — R262 Difficulty in walking, not elsewhere classified: Secondary | ICD-10-CM

## 2022-09-13 DIAGNOSIS — R2681 Unsteadiness on feet: Secondary | ICD-10-CM

## 2022-09-13 MED ORDER — LORAZEPAM 0.5 MG PO TABS: 0.5000 mg | ORAL_TABLET | Freq: Every day | ORAL | 0 refills | Status: DC | PRN

## 2022-09-13 NOTE — Telephone Encounter (Signed)
Send to [facility]

## 2022-09-20 ENCOUNTER — Encounter: Payer: Self-pay | Admitting: Physical Therapy

## 2022-09-20 ENCOUNTER — Ambulatory Visit: Payer: 59 | Attending: Neurosurgery | Admitting: Physical Therapy

## 2022-09-20 DIAGNOSIS — R2689 Other abnormalities of gait and mobility: Secondary | ICD-10-CM | POA: Diagnosis not present

## 2022-09-20 DIAGNOSIS — M5459 Other low back pain: Secondary | ICD-10-CM | POA: Diagnosis not present

## 2022-09-20 DIAGNOSIS — R2681 Unsteadiness on feet: Secondary | ICD-10-CM | POA: Diagnosis not present

## 2022-09-20 DIAGNOSIS — R262 Difficulty in walking, not elsewhere classified: Secondary | ICD-10-CM | POA: Insufficient documentation

## 2022-09-20 DIAGNOSIS — R269 Unspecified abnormalities of gait and mobility: Secondary | ICD-10-CM | POA: Insufficient documentation

## 2022-09-20 DIAGNOSIS — M6281 Muscle weakness (generalized): Secondary | ICD-10-CM | POA: Diagnosis not present

## 2022-09-20 NOTE — Therapy (Signed)
OUTPATIENT PHYSICAL THERAPY THORACOLUMBAR NOTE   Patient Name: Jamie [name] MRN: [id number] DOB:1971/03/23, 52 y.o., female Today's Date: 09/20/2022  END OF SESSION:  PT End of Session - 09/20/22 0759     Visit Number 5    Number of Visits 16    Date for PT Re-Evaluation 10/04/22    Authorization Type [facility] 25 visits then medical review necessary for more visits.    Progress Note Due on Visit 10    PT Start Time 0803    PT Stop Time [phone]    PT Time Calculation (min) 41 min    Equipment Utilized During Treatment Gait belt    Activity Tolerance Patient tolerated treatment well;Patient limited by pain    Behavior During Therapy WFL for tasks assessed/performed                Past Medical History:  Diagnosis Date   Anxiety    takes Ativan daily.  Panic Attack   Arthritis    Chronic back pain    herniated disc/stenosis/scoliosis   Depression    takes Effexor daily   Eczema    uses a cream daily as needed   Family history of adverse reaction to anesthesia    pta dad is very hard to wake up;excessive nausea   History of bronchitis    > 62yr[age]   Hx of dysplastic nevus 05/10/2020   neck - posterior, Severe Atypia. Shave removal 07/18/2020, margins free   Hypoglycemia    Insomnia    takes Lorazepam nightly   Joint pain    PONV (postoperative nausea and vomiting)    Seasonal allergies    uses Flonase daily   Torn rotator cuff    Vitamin D deficiency    takes Vit D daily   Wears contact lenses    Past Surgical History:  Procedure Laterality Date   BACK SURGERY  2008/2012   laminectomy 1st time and 2nd time laminectomy and fusion   COLONOSCOPY WITH PROPOFOL N/A 05/27/2020   Procedure: COLONOSCOPY WITH PROPOFOL;  Surgeon: TVirgel Manifold MD;  Location: [facility];  Service: Gastroenterology;  Laterality: N/A;  COVID POSITIVE AUGUST 16   MAXIMUM ACCESS (MAS)POSTERIOR LUMBAR INTERBODY FUSION (PLIF) 2 LEVEL N/A 09/01/2014   Procedure: Lumbar four-five,  Lumbar five-Sacral one Maximum Access Surgery posterior lumbar interbody fusion with interbody prosthesis posterior lateral arthrodesis posterior segmental instrumentation;  Surgeon: GElaina Hoops MD;  [place] [facility];  Service: Neurosurgery;  Laterality: N/A;  Lumbar four-five, Lumbar five-Sacral one Maximum Access Surgery posterior lumbar interbody fusion with i   SHOULDER ARTHROSCOPY WITH SUBACROMIAL DECOMPRESSION AND OPEN ROTATOR C Right 08/18/2021   Procedure: Right shoulder arthroscopic rotator cuff repair, biceps tenodesis, subacromial decompression;  Surgeon: PLeim Fabry MD;  [place] [facility]  Service: Orthopedics;  Laterality: Right;   Patient Active Problem List   Diagnosis Date Noted   Generalized body aches 06/22/2022   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, [place] 02/23/2022   [place] 10/19/2020   GAD (generalized anxiety disorder) 04/26/2020   Panic attack 04/26/2020   At risk for long QT syndrome 04/26/2020   DDD (degenerative disc disease), lumbar 05/12/2019   Allergic rhinitis 05/11/2019   Primary osteoarthritis of right knee 03/20/2018   Insomnia 08/24/2017   Chronic back pain 10/16/2016   Pseudoarthrosis of lumbar spine 08/11/2015   Eczema 05/09/2015   Spinal stenosis of lumbar region 09/01/2014   Spondylolisthesis 08/03/2014   Displacement of lumbar intervertebral disc without myelopathy 08/03/2014  PCP: Valerie Roys, DO   REFERRING PROVIDER: Meade Maw, MD   REFERRING DIAG:  [phone] (ICD-10-CM) - Chronic left-sided low back pain with left-sided sciatica  S32.009K (ICD-10-CM) - Pseudoarthrosis of lumbar spine  M25.552 (ICD-10-CM) - Left hip pain    Rationale for Evaluation and Treatment: Rehabilitation  THERAPY DIAG:  Other low back pain  Difficulty in walking, not elsewhere classified  Other abnormalities of gait and mobility  Abnormality of gait and mobility  Unsteadiness on  feet  ONSET DATE: 07/21/2015  SUBJECTIVE:                                                                                                                                                                                           SUBJECTIVE STATEMENT: Pt is apologetic for missing therapy last week but her work duties did not allow her to get to the clinic that day. Pt reports he is going to her doctor around February 20 for follow-up for her back.  Patient ambulates with constant hip flexion on both sides and noticeable limp on her right lower extremity coming in this date.  PERTINENT HISTORY:  Pt had laminectomy, fusion and 2 more fusions (more recent). Pt reports pain "never left" but she was unable to prioritize her pain management due to . 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   Pt localizes pain to her back and radiates down her right hip and LE. Pt reports the last time she saw the MD her left hip and groin were very painful but it is feeling better now. Pt feels weak and shaky in her Romilda Joy and feels her core is very weak. Pt infers she may require surgical intervention if PT and hopes PT will help with her strength, tightness and pain prior to any required surgical procedure.   PAIN:  Are you having pain? Yes: NPRS scale: 5/10 Pain location: Lower back radiating to both sides and shooting on her R  Pain description: shooting, sharp, stabbing, aching Aggravating factors: prolonged positioning without movement  Relieving factors: movement, zero gravity position in bed, medicine, ice, heat rotating   PRECAUTIONS: None  WEIGHT BEARING RESTRICTIONS: No  FALLS:  Has patient fallen in last 6 months? Yes. Number of falls 2 Pt fell going upstairs when her leg gave out and another time going down the stairs.   LIVING ENVIRONMENT: Lives with: lives with their family and is primary caretaker for her parents and has a lab  dog Lives in: House/apartment Stairs: Yes: Internal: 2 flights  steps; can  reach both and External: 4 steps; on right going up Has following equipment at home: None  OCCUPATION: Pt works as team lead mostly working at desk where she is able to stand and sit.   PLOF: Independent and Pain associated with all activities but is able to tolerate and complete   PATIENT GOALS: core strength, leg strength  NEXT MD VISIT: None scheduled but will be scheduling following MRI results  OBJECTIVE: from eval unless specified otherwise  DIAGNOSTIC FINDINGS:  Getting MRI 08/11/22  PATIENT SURVEYS:  Modified Oswestry 60  FOTO 43   SCREENING FOR RED FLAGS: Bowel or bladder incontinence: No Spinal tumors: No Cauda equina syndrome: No Compression fracture: No Abdominal aneurysm: No  COGNITION: Overall cognitive status: Within functional limits for tasks assessed     SENSATION: Light touch: Impaired  on L LE only but able to feel as lower level (ankle and lower calf)   MUSCLE LENGTH: Hamstrings: Right 28 deg; Left 45 deg from full extension  Thomas test: Right 10 deg; Left 40 deg pain limiting   POSTURE: weight shift left  PALPATION: Complete session 2 if pt tolerates.   LUMBAR ROM:   AROM eval  Flexion To knees   Extension No extension tolerable ( moves knees to flexion_   Right lateral flexion To knee  Left lateral flexion Mid thigh   Right rotation 30  Left rotation 26   (Blank rows = not tested)  LOWER EXTREMITY ROM:       Right eval Left eval  Hip flexion  Limited due to pain   Hip extension    Hip abduction    Hip adduction    Hip internal rotation    Hip external rotation    Knee flexion WNL WNL  Knee extension WNL WNL  Ankle dorsiflexion    Ankle plantarflexion    Ankle inversion    Ankle eversion     (Blank rows = not tested)  LOWER EXTREMITY MMT:    MMT Right eval Left eval  Hip flexion 3+ 3+  Hip extension    Hip abduction 4* 4*  Hip adduction 4*  4*  Hip internal rotation    Hip external rotation    Knee flexion 3+ 3+  Knee extension 3+ 3+  Ankle dorsiflexion 4 4  Ankle plantarflexion 4 4  Ankle inversion    Ankle eversion 4 4   (Blank rows = not tested) - Trembling noted in LE with all strength testing  -Asterix indicate pain limited strength -All tested in seated position   LUMBAR SPECIAL TESTS:  Straight leg raise test: Positive, Quadrant test: Positive, FABER test: Negative, and Thomas test: Positive FADIR positive Bilateral with hip and back pain Positive SLR bilateral with more intensity and more sensitive on the left compared to the right   FUNCTIONAL TESTS:  5 times sit to stand: 26.82 sec Pain noted with 5 times sit to stand test GAIT: Distance walked: 60 feet  Assistive device utilized: None Level of assistance: Complete Independence Comments: knee flexion on right throughout gait, unable to achieve heel strike on this side.   TODAY'S TREATMENT:  DATE: 09/20/22  On mat table with moist heat applied to low back region for pain relief  Supine knee to chect and knee circumduction stretching, some hip discomfort but no back pain x 45 sec hods and x 5 circles CCW and CW   LTRs x 10 each side within pain-free ROM- needed heat this date due to pain with this activity   TrA activation in hooklye 10* 5 sec holds. Cuing for technique with additional tactile feedback.  -Progressed to marching 2 sets of 10 marches on each side with core activation.  No increase in pain noted  Supine SAQ 2 x 10 with 5# AW- reports feels good with no pain   Hooklye adductor squeeze with ball  2x10x5 sec holds/rep  Hip flexor stretch using step 2 x 45 sec ea LE      PATIENT EDUCATION:  Education details: Pt educated throughout session about proper posture and technique with exercises. Improved exercise  technique, movement at target joints, use of target muscles after min to mod verbal, visual, tactile cues. HEP HEP  Person educated: Patient Education method: Explanation, Demonstration, Tactile cues, Verbal cues, and Handouts Education comprehension: verbalized understanding, returned demonstration, and needs further education  HOME EXERCISE PROGRAM: Access Code: [email or web address] URL: [email or web address] Date: 08/14/2022 Prepared by: Ricard Dillon  Exercises - Supine Transversus Abdominis Bracing - Hands on Stomach  - 1 x daily - 7 x weekly - 2 sets - 10 reps - 5 second  hold - Supine Quad Set  - 1 x daily - 7 x weekly - 2 sets - 10 reps - 5 sec  hold - Clamshell  - 1 x daily - 7 x weekly - 2 sets - 10 reps - Supine Hip Adduction Isometric with Ball  - 1 x daily - 7 x weekly - 2 sets - 10 reps - 5 seocnd hold - Hooklying Clamshell with Resistance  - 1 x daily - 7 x weekly - 2 sets - 10 reps  ASSESSMENT:  CLINICAL IMPRESSION:  Pt presents with good motivation for completion of PT activities. Pt continues with LE strengthening, core activation and LE mobility training.  Pt progressed with LE strength with resistance and reps this date.  Patient is able to perform long arc quad with ankle in neutral position indicating improved bilateral lower extremity neural tension this date.  Pt continuing to complete HEP as prescribed. Pt pain levels continue to be high and limiting despite improvements noted above. Pt will continue to benefit from skilled physical therapy intervention to address impairments, improve QOL, and attain therapy goals.     OBJECTIVE IMPAIRMENTS: Abnormal gait, decreased activity tolerance, decreased mobility, difficulty walking, decreased strength, impaired flexibility, impaired sensation, and pain.   ACTIVITY LIMITATIONS: carrying, lifting, bending, sitting, standing, squatting, stairs, and bed mobility  PARTICIPATION LIMITATIONS: cleaning, laundry, driving,  shopping, community activity, and yard work  PERSONAL FACTORS: Age and Time since onset of injury/illness/exacerbation are also affecting patient's functional outcome.   REHAB POTENTIAL: Fair Chronicity and pending MRI results for potential surgical intervention   CLINICAL DECISION MAKING: Stable/uncomplicated  EVALUATION COMPLEXITY: Low   GOALS: Goals reviewed with patient? Yes  SHORT TERM GOALS: Target date: 09/06/2022      Patient will be independent in home exercise program to improve strength/mobility for better functional independence with ADLs. Baseline: No HEP currently  Goal status: INITIAL  LONG TERM GOALS: Target date: 10/04/2022    Patient will improve focus on therapeutic outcome score by 7 points or more  indicating improved self-reported pain levels of various activities Baseline: 43 Goal status: INITIAL  2.  Patient will improve modified Oswestry stability index score by 10% or greater indicating improved self-reported pain levels with various activities Baseline: 60 Goal status: INITIAL  3.  Patient will improve bilateral lower extremity strength to 4+ out of 5 or greater with all major planes in knee and hip strength tested in seated positioning Baseline: See eval charts Goal status: INITIAL  4.  Patient will improve supine straight leg raise test on the left side to equal distance to the right number to indicate improved neural tension on her left lower extremity. Baseline: Left lower extremity pain exacerbation in low back and posterior leg very quick to cause pain with supine straight leg raise test Goal status: INITIAL  5.  Patient will improve 5 times sit to stand test to 18 seconds or less indicating improved lower extremity strength and stability as well as improved lower extremity power Baseline: 26.8 sec Goal status: INITIAL  PLAN:  PT FREQUENCY: 2x/week  PT DURATION: 8 weeks  PLANNED INTERVENTIONS: Therapeutic exercises, Therapeutic  activity, Neuromuscular re-education, Balance training, Gait training, Patient/Family education, Self Care, Joint mobilization, Stair training, Spinal mobilization, Cryotherapy, Moist heat, Traction, and Dry needling  .  PLAN FOR NEXT SESSION: LE strength and stretches for relief of muscular tightness with focus on not exacerbation pain with the exercises. Continue plan    Particia Lather, PT 09/20/2022, 10:26 AM

## 2022-09-26 ENCOUNTER — Other Ambulatory Visit (HOSPITAL_COMMUNITY): Payer: Self-pay

## 2022-09-26 ENCOUNTER — Other Ambulatory Visit: Payer: Self-pay

## 2022-09-26 NOTE — Therapy (Unsigned)
OUTPATIENT PHYSICAL THERAPY THORACOLUMBAR NOTE   Patient Name: Jamie [name] MRN: [id number] DOB:[birth year], 52 y.o., female Today's Date: 09/27/2022  END OF SESSION:  PT End of Session - 09/27/22 0830     Visit Number 6    Number of Visits 16    Date for PT Re-Evaluation 10/04/22    Authorization Type [facility] 25 visits then medical review necessary for more visits.    Progress Note Due on Visit 10    PT Start Time 0800    PT Stop Time 0835    PT Time Calculation (min) 35 min    Equipment Utilized During Treatment Gait belt    Activity Tolerance Patient tolerated treatment well;Patient limited by pain    Behavior During Therapy WFL for tasks assessed/performed                 Past Medical History:  Diagnosis Date   Anxiety    takes Ativan daily.  Panic Attack   Arthritis    Chronic back pain    herniated disc/stenosis/scoliosis   Depression    takes Effexor daily   Eczema    uses a cream daily as needed   Family history of adverse reaction to anesthesia    pta dad is very hard to wake up;excessive nausea   History of bronchitis    > 20yrago   Hx of dysplastic nevus 05/10/2020   neck - posterior, Severe Atypia. Shave removal 07/18/2020, margins free   Hypoglycemia    Insomnia    takes Lorazepam nightly   Joint pain    PONV (postoperative nausea and vomiting)    Seasonal allergies    uses Flonase daily   Torn rotator cuff    Vitamin D deficiency    takes Vit D daily   Wears contact lenses    Past Surgical History:  Procedure Laterality Date   BACK SURGERY  2008/2012   laminectomy 1st time and 2nd time laminectomy and fusion   COLONOSCOPY WITH PROPOFOL N/A 05/27/2020   Procedure: COLONOSCOPY WITH PROPOFOL;  Surgeon: TVirgel Manifold MD;  Location: [facility];  Service: Gastroenterology;  Laterality: N/A;  COVID POSITIVE AUGUST 16   MAXIMUM ACCESS (MAS)POSTERIOR LUMBAR INTERBODY FUSION (PLIF) 2 LEVEL N/A 09/01/2014   Procedure: Lumbar four-five,  Lumbar five-Sacral one Maximum Access Surgery posterior lumbar interbody fusion with interbody prosthesis posterior lateral arthrodesis posterior segmental instrumentation;  Surgeon: GElaina Hoops MD;  [place] [facility];  Service: Neurosurgery;  Laterality: N/A;  Lumbar four-five, Lumbar five-Sacral one Maximum Access Surgery posterior lumbar interbody fusion with i   SHOULDER ARTHROSCOPY WITH SUBACROMIAL DECOMPRESSION AND OPEN ROTATOR C Right 08/18/2021   Procedure: Right shoulder arthroscopic rotator cuff repair, biceps tenodesis, subacromial decompression;  Surgeon: PLeim Fabry MD;  [place]  Service: Orthopedics;  Laterality: Right;   Patient Active Problem List   Diagnosis Date Noted   Generalized body aches 06/22/2022   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, unspecified ([place] 02/23/2022   Recurrent major depression-severe ([place] 10/19/2020   GAD (generalized anxiety disorder) 04/26/2020   Panic attack 04/26/2020   At risk for long QT syndrome 04/26/2020   DDD (degenerative disc disease), lumbar 05/12/2019   Allergic rhinitis 05/11/2019   Primary osteoarthritis of right knee 03/20/2018   Insomnia 08/24/2017   Chronic back pain 10/16/2016   Pseudoarthrosis of lumbar spine 08/11/2015   Eczema 05/09/2015   Spinal stenosis of lumbar region 09/01/2014   Spondylolisthesis 08/03/2014   Displacement of lumbar intervertebral disc without myelopathy  08/03/2014    PCP: Valerie Roys, DO   REFERRING PROVIDER: Meade Maw, MD   REFERRING DIAG:  [phone] (ICD-10-CM) - Chronic left-sided low back pain with left-sided sciatica  S32.009K (ICD-10-CM) - Pseudoarthrosis of lumbar spine  M25.552 (ICD-10-CM) - Left hip pain    Rationale for Evaluation and Treatment: Rehabilitation  THERAPY DIAG:  Other low back pain  Difficulty in walking, not elsewhere classified  Other abnormalities of gait and mobility  Muscle weakness (generalized)  ONSET DATE:  07/21/2015  SUBJECTIVE:                                                                                                                                                                                           SUBJECTIVE STATEMENT: Pt reports slight pain improvements this date. Reports anterior aspect of hips has improved but still having pain in gluteal region with radiating symptoms.   PERTINENT HISTORY:  Pt had laminectomy, fusion and 2 more fusions (more recent). Pt reports pain "never left" but she was unable to prioritize her pain management due to . 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   Pt localizes pain to her back and radiates down her right hip and LE. Pt reports the last time she saw the MD her left hip and groin were very painful but it is feeling better now. Pt feels weak and shaky in her Romilda Joy and feels her core is very weak. Pt infers she may require surgical intervention if PT and hopes PT will help with her strength, tightness and pain prior to any required surgical procedure.   PAIN:  Are you having pain? Yes: NPRS scale: 5/10 Pain location: R side lumbar and gluteal region with some radiation down leg   Pain description: shooting, sharp, stabbing, aching Aggravating factors: prolonged positioning without movement  Relieving factors: movement, zero gravity position in bed, medicine, ice, heat rotating   PRECAUTIONS: None  WEIGHT BEARING RESTRICTIONS: No  FALLS:  Has patient fallen in last 6 months? Yes. Number of falls 2 Pt fell going upstairs when her leg gave out and another time going down the stairs.   LIVING ENVIRONMENT: Lives with: lives with their family and is primary caretaker for her parents and has a lab dog Lives in: House/apartment Stairs: Yes: Internal: 2 flights  steps; can reach both and External: 4 steps; on right going up Has following equipment at home: None  OCCUPATION: Pt  works as team lead mostly working at desk where she is able to stand and sit.  PLOF: Independent and Pain associated with all activities but is able to tolerate and complete   PATIENT GOALS: core strength, leg strength  NEXT MD VISIT: None scheduled but will be scheduling following MRI results  OBJECTIVE: from eval unless specified otherwise  DIAGNOSTIC FINDINGS:  Getting MRI 08/11/22  PATIENT SURVEYS:  Modified Oswestry 60  FOTO 43   SCREENING FOR RED FLAGS: Bowel or bladder incontinence: No Spinal tumors: No Cauda equina syndrome: No Compression fracture: No Abdominal aneurysm: No  COGNITION: Overall cognitive status: Within functional limits for tasks assessed     SENSATION: Light touch: Impaired  on L LE only but able to feel as lower level (ankle and lower calf)   MUSCLE LENGTH: Hamstrings: Right 28 deg; Left 45 deg from full extension  Thomas test: Right 10 deg; Left 40 deg pain limiting   POSTURE: weight shift left  PALPATION: Complete session 2 if pt tolerates.   LUMBAR ROM:   AROM eval  Flexion To knees   Extension No extension tolerable ( moves knees to flexion_   Right lateral flexion To knee  Left lateral flexion Mid thigh   Right rotation 30  Left rotation 26   (Blank rows = not tested)  LOWER EXTREMITY ROM:       Right eval Left eval  Hip flexion  Limited due to pain   Hip extension    Hip abduction    Hip adduction    Hip internal rotation    Hip external rotation    Knee flexion WNL WNL  Knee extension WNL WNL  Ankle dorsiflexion    Ankle plantarflexion    Ankle inversion    Ankle eversion     (Blank rows = not tested)  LOWER EXTREMITY MMT:    MMT Right eval Left eval  Hip flexion 3+ 3+  Hip extension    Hip abduction 4* 4*  Hip adduction 4* 4*  Hip internal rotation    Hip external rotation    Knee flexion 3+ 3+  Knee extension 3+ 3+  Ankle dorsiflexion 4 4  Ankle plantarflexion 4 4  Ankle inversion    Ankle  eversion 4 4   (Blank rows = not tested) - Trembling noted in LE with all strength testing  -Asterix indicate pain limited strength -All tested in seated position   LUMBAR SPECIAL TESTS:  Straight leg raise test: Positive, Quadrant test: Positive, FABER test: Negative, and Thomas test: Positive FADIR positive Bilateral with hip and back pain Positive SLR bilateral with more intensity and more sensitive on the left compared to the right   FUNCTIONAL TESTS:  5 times sit to stand: 26.82 sec Pain noted with 5 times sit to stand test GAIT: Distance walked: 60 feet  Assistive device utilized: None Level of assistance: Complete Independence Comments: knee flexion on right throughout gait, unable to achieve heel strike on this side.   TODAY'S TREATMENT:  DATE: 09/27/22  On mat table with moist heat applied to low back region for pain relief  TE  Supine knee to chect and knee circumduction stretching, some hip discomfort but no back pain x 45 sec hods and x 5 circles CCW and CW   LTRs x 10 each side within pain-free ROM- needed heat this date due to pain with this activity   TrA activation in hooklye, Cuing for technique with additional tactile feedback.  -marching 2 sets of 10 marches on each side with core activation.  No increase in pain noted  Manual  Sidelying on left with STM and IASTM to relase trigger points and tight bands on the R piriformis and surrounding gluteal musculature, improvement in pain s/s following.  -Pt instructed in proper foam rolling of this area  TE  Supine figure 4 stretch 2 x 45 seconds bilaterally.   PT session ended a few minutes early 2/2 pt needing to get to work at certain time.      PATIENT EDUCATION:  Education details: Pt educated throughout session about proper posture and technique with exercises. Improved exercise  technique, movement at target joints, use of target muscles after min to mod verbal, visual, tactile cues. HEP HEP  Person educated: Patient Education method: Explanation, Demonstration, Tactile cues, Verbal cues, and Handouts Education comprehension: verbalized understanding, returned demonstration, and needs further education  HOME EXERCISE PROGRAM: Access Code: [email or web address] URL: [email or web address] Date: 08/14/2022 Prepared by: Ricard Dillon  Exercises - Supine Transversus Abdominis Bracing - Hands on Stomach  - 1 x daily - 7 x weekly - 2 sets - 10 reps - 5 second  hold - Supine Quad Set  - 1 x daily - 7 x weekly - 2 sets - 10 reps - 5 sec  hold - Clamshell  - 1 x daily - 7 x weekly - 2 sets - 10 reps - Supine Hip Adduction Isometric with Ball  - 1 x daily - 7 x weekly - 2 sets - 10 reps - 5 seocnd hold - Hooklying Clamshell with Resistance  - 1 x daily - 7 x weekly - 2 sets - 10 reps  ASSESSMENT:  CLINICAL IMPRESSION:  Pt presents with good motivation for completion of PT activities.  Pt shows some progress today with pain in her anterior hips following prescribed hip flexor stretching and pt also shows progress with manual therapy targeting taut bands in piriformis and posterior gluteal musculature and reports improved pain levels following. Pt continuing to complete HEP as prescribed. Pt will continue to benefit from skilled physical therapy intervention to address impairments, improve QOL, and attain therapy goals.     OBJECTIVE IMPAIRMENTS: Abnormal gait, decreased activity tolerance, decreased mobility, difficulty walking, decreased strength, impaired flexibility, impaired sensation, and pain.   ACTIVITY LIMITATIONS: carrying, lifting, bending, sitting, standing, squatting, stairs, and bed mobility  PARTICIPATION LIMITATIONS: cleaning, laundry, driving, shopping, community activity, and yard work  PERSONAL FACTORS: Age and Time since onset of  injury/illness/exacerbation are also affecting patient's functional outcome.   REHAB POTENTIAL: Fair Chronicity and pending MRI results for potential surgical intervention   CLINICAL DECISION MAKING: Stable/uncomplicated  EVALUATION COMPLEXITY: Low   GOALS: Goals reviewed with patient? Yes  SHORT TERM GOALS: Target date: 09/06/2022      Patient will be independent in home exercise program to improve strength/mobility for better functional independence with ADLs. Baseline: No HEP currently  Goal status: INITIAL  LONG TERM GOALS: Target date: 10/04/2022    Patient will improve focus  on therapeutic outcome score by 7 points or more indicating improved self-reported pain levels of various activities Baseline: 43 Goal status: INITIAL  2.  Patient will improve modified Oswestry stability index score by 10% or greater indicating improved self-reported pain levels with various activities Baseline: 60 Goal status: INITIAL  3.  Patient will improve bilateral lower extremity strength to 4+ out of 5 or greater with all major planes in knee and hip strength tested in seated positioning Baseline: See eval charts Goal status: INITIAL  4.  Patient will improve supine straight leg raise test on the left side to equal distance to the right number to indicate improved neural tension on her left lower extremity. Baseline: Left lower extremity pain exacerbation in low back and posterior leg very quick to cause pain with supine straight leg raise test Goal status: INITIAL  5.  Patient will improve 5 times sit to stand test to 18 seconds or less indicating improved lower extremity strength and stability as well as improved lower extremity power Baseline: 26.8 sec Goal status: INITIAL  PLAN:  PT FREQUENCY: 2x/week  PT DURATION: 8 weeks  PLANNED INTERVENTIONS: Therapeutic exercises, Therapeutic activity, Neuromuscular re-education, Balance training, Gait training, Patient/Family education,  Self Care, Joint mobilization, Stair training, Spinal mobilization, Cryotherapy, Moist heat, Traction, and Dry needling  .  PLAN FOR NEXT SESSION: LE strength and stretches for relief of muscular tightness with focus on not exacerbation pain with the exercises. Continue plan    Particia Lather, PT 09/27/2022, 8:30 AM

## 2022-09-27 ENCOUNTER — Other Ambulatory Visit (HOSPITAL_COMMUNITY): Payer: Self-pay

## 2022-09-27 ENCOUNTER — Ambulatory Visit: Payer: 59 | Admitting: Physical Therapy

## 2022-09-27 DIAGNOSIS — R262 Difficulty in walking, not elsewhere classified: Secondary | ICD-10-CM

## 2022-09-27 DIAGNOSIS — M5459 Other low back pain: Secondary | ICD-10-CM | POA: Diagnosis not present

## 2022-09-27 DIAGNOSIS — R2689 Other abnormalities of gait and mobility: Secondary | ICD-10-CM

## 2022-09-27 DIAGNOSIS — R269 Unspecified abnormalities of gait and mobility: Secondary | ICD-10-CM | POA: Diagnosis not present

## 2022-09-27 DIAGNOSIS — R2681 Unsteadiness on feet: Secondary | ICD-10-CM | POA: Diagnosis not present

## 2022-09-27 DIAGNOSIS — M6281 Muscle weakness (generalized): Secondary | ICD-10-CM | POA: Diagnosis not present

## 2022-10-01 ENCOUNTER — Encounter: Payer: Self-pay | Admitting: Family Medicine

## 2022-10-02 ENCOUNTER — Other Ambulatory Visit: Payer: Self-pay

## 2022-10-02 ENCOUNTER — Encounter: Payer: Commercial Managed Care - PPO | Admitting: Physical Therapy

## 2022-10-02 MED ORDER — MORPHINE SULFATE ER 30 MG PO TBCR
30.0000 mg | EXTENDED_RELEASE_TABLET | Freq: Every morning | ORAL | 0 refills | Status: DC
  Filled 2022-10-12: qty 28, 28d supply, fill #0

## 2022-10-02 MED ORDER — GABAPENTIN 600 MG PO TABS
600.0000 mg | ORAL_TABLET | Freq: Three times a day (TID) | ORAL | 11 refills | Status: DC
  Filled 2022-10-02: qty 90, 30d supply, fill #0
  Filled 2022-11-22: qty 90, 30d supply, fill #1
  Filled 2023-01-16: qty 90, 30d supply, fill #2

## 2022-10-02 MED ORDER — METAXALONE 800 MG PO TABS
800.0000 mg | ORAL_TABLET | Freq: Four times a day (QID) | ORAL | 11 refills | Status: DC | PRN
  Filled 2022-10-02: qty 120, 30d supply, fill #0
  Filled 2022-11-22: qty 120, 30d supply, fill #1
  Filled 2023-01-16: qty 120, 30d supply, fill #2

## 2022-10-02 MED ORDER — OXYCODONE HCL 15 MG PO TABS
15.0000 mg | ORAL_TABLET | Freq: Four times a day (QID) | ORAL | 0 refills | Status: DC | PRN
  Filled 2022-10-12 – 2022-10-25 (×2): qty 112, 28d supply, fill #0

## 2022-10-02 MED ORDER — MELOXICAM 7.5 MG PO TABS
7.5000 mg | ORAL_TABLET | Freq: Two times a day (BID) | ORAL | 11 refills | Status: DC
  Filled 2022-10-02: qty 60, 30d supply, fill #0
  Filled 2022-11-22: qty 60, 30d supply, fill #1
  Filled 2023-01-16: qty 60, 30d supply, fill #2

## 2022-10-04 ENCOUNTER — Ambulatory Visit: Payer: 59 | Admitting: Physical Therapy

## 2022-10-08 DIAGNOSIS — Z872 Personal history of diseases of the skin and subcutaneous tissue: Secondary | ICD-10-CM | POA: Diagnosis not present

## 2022-10-08 DIAGNOSIS — L409 Psoriasis, unspecified: Secondary | ICD-10-CM | POA: Diagnosis not present

## 2022-10-08 DIAGNOSIS — M797 Fibromyalgia: Secondary | ICD-10-CM | POA: Diagnosis not present

## 2022-10-08 DIAGNOSIS — M160 Bilateral primary osteoarthritis of hip: Secondary | ICD-10-CM | POA: Diagnosis not present

## 2022-10-09 ENCOUNTER — Encounter: Payer: Commercial Managed Care - PPO | Admitting: Physical Therapy

## 2022-10-09 ENCOUNTER — Encounter: Payer: Self-pay | Admitting: Neurosurgery

## 2022-10-09 ENCOUNTER — Ambulatory Visit (INDEPENDENT_AMBULATORY_CARE_PROVIDER_SITE_OTHER): Payer: 59 | Admitting: Neurosurgery

## 2022-10-09 VITALS — BP 115/65 | Ht 67.0 in | Wt 175.8 lb

## 2022-10-09 DIAGNOSIS — M960 Pseudarthrosis after fusion or arthrodesis: Secondary | ICD-10-CM | POA: Diagnosis not present

## 2022-10-09 DIAGNOSIS — S32049K Unspecified fracture of fourth lumbar vertebra, subsequent encounter for fracture with nonunion: Secondary | ICD-10-CM | POA: Diagnosis not present

## 2022-10-09 DIAGNOSIS — M5442 Lumbago with sciatica, left side: Secondary | ICD-10-CM

## 2022-10-09 DIAGNOSIS — G8929 Other chronic pain: Secondary | ICD-10-CM

## 2022-10-09 DIAGNOSIS — S32009K Unspecified fracture of unspecified lumbar vertebra, subsequent encounter for fracture with nonunion: Secondary | ICD-10-CM

## 2022-10-09 NOTE — Patient Instructions (Signed)
Please see below for information in regards to your upcoming surgery:  **we are planning to move our office location mid-March. Please check with [place] to see if we have moved to our new location prior to coming to your post op appointments after surgery. Our phone number will remain the same [phone]). Our new address will be: [facility] [address] [place],  [address]   Planned surgery: L4-5 fusion   Surgery date: 12/03/22 - you will find out your arrival time the business day before your surgery.   Pre-op appointment at [facility]: we will call you with a date/time for this. Pre-admit testing is located on the first floor of the [facility], [place], [address]. Please bring all prescriptions in the original prescription bottles to your appointment, even if you have reviewed medications by phone with a pharmacy representative. Pre-op labs may be done at your pre-op appointment. You are not required to fast for these labs. Should you need to change your pre-op appointment, please call Pre-admit testing at [phone].    Surgical clearance: we will send a clearance request to Dr Wynetta Emery    NSAIDS (Non-steroidal anti-inflammatory drugs): because you are having a fusion, no NSAIDS (such as ibuprofen, aleve, naproxen, meloxicam, diclofenac) for 3 months after surgery. Celebrex is an exception. Tylenol is ok because it is not an NSAID.   Because you are having a fusion: for appointments after your 2 week follow-up: please arrive at the [facility] outpatient imaging center ([place], [place]) or [facility] one hour prior to your appointment for x-rays. This applies to every appointment after your 2 week follow-up. Failure to do so may result in your appointment being rescheduled.   If you have FMLA/disability paperwork, please drop it off or fax it to  [phone], attention [place].   We can be reached by phone or mychart 8am-4pm, Monday-Friday. If you have any questions/concerns before or after surgery, you can reach [place] at [phone], or you can send a mychart message. If you have a concern after hours that cannot wait until normal business hours, you can call [phone] and ask to page the neurosurgeon on call for [place].    Appointments/FMLA & disability paperwork: [place]  Nurse: Ophelia Shoulder  Medical assistants: Lum Keas Physician Assistant's: [place] Surgeon: Meade Maw, MD

## 2022-10-09 NOTE — Progress Notes (Signed)
Referring Physician:  Valerie Roys, DO [place],   [id number]  Primary Physician:  Valerie Roys, DO  History of Present Illness: 10/09/2022 Jamie [name] returns to see me.  She continues to have significant positional back pain.  She has done physical therapy without improvement.  07/31/2022 Jamie [name] is here today with a chief complaint of continued back pain.  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  She has been dealing with multiple family issues that have kept her from intervention.  She has not done any conservative management in the past year.    I have utilized the care everywhere function in epic to review the outside records available from external health systems.  01/16/2022 Jamie [name] is a 52 y.o with a history of lumbosacral disease, right rotator cuff tear, recent diagnosis of psoriatic arthritis, eczema, fibromyalgia who is here today with a chief complaint of neck and right sided arm pain. She states is been going on for about a year without any particular inciting event. She does have underlying rotator cuff injury but states that this feels different than that. She describes the pain as burning, stabbing, and shooting with some numbness and tingling particularly with sleep. She denies any exacerbating or relieving factors. She states that the pain radiates from her right neck down the posterior lateral aspect of her right arm and to the elbow without extension beyond the elbow. She denies any similar symptoms on the left. She denies any dexterity changes or disturbance in gait outside of her antalgic gait for chronic lumbosacral disease. She denies any significant weakness.  Conservative measures:  Physical therapy: none  Multimodal medical therapy including regular antiinflammatories: Morphine, oxycodone, Mobic, Skelaxin Injections: no recent epidural steroid injections  Past  Surgery: no previous cervical surgeries   Jamie [name] has no symptoms of cervical myelopathy.  The symptoms are causing a significant impact on the patient's life.    Review of Systems:  A 10 point review of systems is negative, except for the pertinent positives and negatives detailed in the HPI.  Past Medical History: Past Medical History:  Diagnosis Date   Anxiety    takes Ativan daily.  Panic Attack   Arthritis    Chronic back pain    herniated disc/stenosis/scoliosis   Depression    takes Effexor daily   Eczema    uses a cream daily as needed   Family history of adverse reaction to anesthesia    pta dad is very hard to wake up;excessive nausea   History of bronchitis    > 68yr[age]   Hx of dysplastic nevus 05/10/2020   neck - posterior, Severe Atypia. Shave removal 07/18/2020, margins free   Hypoglycemia    Insomnia    takes Lorazepam nightly   Joint pain    PONV (postoperative nausea and vomiting)    Seasonal allergies    uses Flonase daily   Torn rotator cuff    Vitamin D deficiency    takes Vit D daily   Wears contact lenses     Past Surgical History: Past Surgical History:  Procedure Laterality Date   BACK SURGERY  2008/2012   laminectomy 1st time and 2nd time laminectomy and fusion   COLONOSCOPY WITH PROPOFOL N/A 05/27/2020   Procedure: COLONOSCOPY WITH PROPOFOL;  Surgeon: TVirgel Manifold MD;  Location: [facility];  Service: Gastroenterology;  Laterality: N/A;  COVID POSITIVE  AUGUST 16   MAXIMUM ACCESS (MAS)POSTERIOR LUMBAR INTERBODY FUSION (PLIF) 2 LEVEL N/A 09/01/2014   Procedure: Lumbar four-five, Lumbar five-Sacral one Maximum Access Surgery posterior lumbar interbody fusion with interbody prosthesis posterior lateral arthrodesis posterior segmental instrumentation;  Surgeon: Elaina Hoops, MD;  Location: [facility];  Service: Neurosurgery;  Laterality: N/A;  Lumbar four-five, Lumbar five-Sacral one Maximum Access Surgery posterior lumbar  interbody fusion with i   SHOULDER ARTHROSCOPY WITH SUBACROMIAL DECOMPRESSION AND OPEN ROTATOR C Right 08/18/2021   Procedure: Right shoulder arthroscopic rotator cuff repair, biceps tenodesis, subacromial decompression;  Surgeon: Leim Fabry, MD;  Location: [place];  Service: Orthopedics;  Laterality: Right;    Allergies: Allergies as of 10/09/2022 - Review Complete 10/09/2022  Allergen Reaction Noted   Amoxicillin Diarrhea, Nausea And Vomiting, and Shortness Of Breath 08/23/2021   Shellfish allergy Hives 08/18/2014   Flexeril [cyclobenzaprine] Hives 08/18/2014    Medications: Current Meds  Medication Sig   Ascorbic Acid (VITAMIN C PO) Take by mouth.   cholecalciferol (VITAMIN D) 1000 UNITS tablet Take 1,000 Units by mouth daily.   clobetasol cream (TEMOVATE) 0.05 % Apply to affected areas 1-2 times a day until rash improved. Avoid face, groin, axilla.   Dupilumab (DUPIXENT) 300 MG/2ML SOPN Inject 300 mg into the skin every 14 (fourteen) days.   fluticasone (FLONASE) 50 MCG/ACT nasal spray Place 1 spray into both nostrils daily as needed for allergies.   gabapentin (NEURONTIN) 600 MG tablet TAKE 1 TABLET BY MOUTH THREE TIMES DAILY   Halobetasol Prop-Tazarotene (DUOBRII) 0.01-0.045 % LOTN Apply 1 application topically at bedtime. qhs to aa rash on hands and feet until clear, then prn flares, avoid face, groin, axilla   lamoTRIgine (LAMICTAL) 150 MG tablet Take 1 tablet (150 mg total) by mouth daily.   LORazepam (ATIVAN) 0.5 MG tablet Take 1 tablet (0.5 mg total) by mouth daily as needed for anxiety. Do not take within 6 hours of your pain medicine   meloxicam (MOBIC) 7.5 MG tablet Take 1 tablet (7.5 mg total) by mouth 2 (two) times daily.   metaxalone (SKELAXIN) 800 MG tablet Take 1 tablet (800 mg total) by mouth 4 (four) times daily as needed.   montelukast (SINGULAIR) 10 MG tablet TAKE 1 TABLET BY MOUTH AT BEDTIME   morphine (MS CONTIN) 30 MG 12 hr tablet Take 1 tablet (30  mg total) by mouth in the morning. (DNF 10/12/22)   Multiple Vitamin (MULTIVITAMIN ADULT PO) Take by mouth daily.   nortriptyline (PAMELOR) 10 MG capsule Take 1 capsule (10 mg total) by mouth at bedtime.   oxyCODONE (ROXICODONE) 15 MG immediate release tablet Take 1 tablet (15 mg total) by mouth every 6 (six) hours as needed for breakthrough pain. (DNF 10/25/22)   PARoxetine (PAXIL) 40 MG tablet Take 1 tablet (40 mg total) by mouth daily.   tacrolimus (PROTOPIC) 0.1 % ointment Apply topically 2 (two) times daily. Apply to feet and hands   Tavaborole 5 % SOLN Apply qhs to affected toenails   [DISCONTINUED] meloxicam (MOBIC) 7.5 MG tablet Take 7.5 mg by mouth 2 (two) times daily.   [DISCONTINUED] metaxalone (SKELAXIN) 800 MG tablet Take by mouth 2 (two) times daily.   [DISCONTINUED] morphine (KADIAN) 30 MG 24 hr capsule Take 30 mg by mouth daily.    [DISCONTINUED] Multiple Vitamins-Minerals (ZINC PO) Take by mouth.    Social History: Social History   Tobacco Use   Smoking status: Former    Packs/day: 0.50    Years:  20.00    Total pack years: 10.00    Types: Cigarettes    Quit date: 10/18/2016    Years since quitting: 5.9   Smokeless tobacco: Never  Vaping Use   Vaping Use: Never used  Substance Use Topics   Alcohol use: No   Drug use: No    Family Medical History: Family History  Problem Relation Age of Onset   Arthritis Mother    Hyperlipidemia Mother    Migraines Mother    Arthritis Father    Diabetes Father    Heart disease Father    Atrial fibrillation Father    Diabetes Brother    Breast cancer Maternal Grandmother    Mental illness Neg Hx     Physical Examination: Vitals:   10/09/22 1540  BP: 115/65    General: Patient is well developed, well nourished, calm, collected, and in no apparent distress. Attention to examination is appropriate.  Neck:   Supple.  Full range of motion.  Respiratory: Patient is breathing without any difficulty.   NEUROLOGICAL:      Awake, alert, oriented to person, place, and time.  Speech is clear and fluent. Fund of knowledge is appropriate.   Cranial Nerves: Pupils equal round and reactive to light.  Facial tone is symmetric.  Facial sensation is symmetric. Shoulder shrug is symmetric. Tongue protrusion is midline.  There is no pronator drift.  ROM of spine: full.    Strength: Side Biceps Triceps Deltoid Interossei Grip Wrist Ext. Wrist Flex.  R 5 5 5 5 5 5 5  $ L 5 5 5 5 5 5 5   $ Side Iliopsoas Quads Hamstring PF DF EHL  R 5 5 5 5 5 5  $ L 5 5 5 5 5 5   $ Reflexes are 1+ and symmetric at the biceps, triceps, brachioradialis, patella and achilles.   Hoffman's is absent.   Bilateral upper and lower extremity sensation is intact to light touch.    No evidence of dysmetria noted.  Gait is antalgic.  She does have pain with FABER testing of the left leg..     Medical Decision Making  Imaging: MR C spine 02/09/22 IMPRESSION: 1. Cervical spondylosis, as outlined. 2. No significant spinal canal stenosis. 3. Multilevel neural foraminal narrowing, greatest on the left at C3-C4 (mild-to-moderate), on the right at C4-C5 (moderate) and on the right at C5-C6 (moderate). 4. Facet arthrosis, greatest on the right at C4-C5 and C5-C6 (advanced at these sites). 5. No more than mild disc degeneration. 6. Minimal grade 1 anterolisthesis at C5-C6.     Electronically Signed   By: Kellie Simmering D.O.   On: 02/09/2022 18:05  CT L spine 11/21/20   IMPRESSION: Solid fusion L3-4 without stenosis   Interbody fusion L4-5. There is lucency throughout the majority of the disc space however there may be an area of solid fusion anteriorly   Pedicle screw and interbody fusion L5-S1. No evidence of solid interbody fusion at this level.     Electronically Signed   By: Franchot Gallo M.D.   On: 11/21/2020 15:45  CT L spine 08/10/2022  IMPRESSION: 1. No evidence of acute fracture or malalignment. 2. Solid L3-L4 fusion. 3.  L4-L5 interbody fusion. The majority of the L4-L5 disc remains lucent but there may be a small area of bony bridging anteriorly, similar. 4. L5-S1 PLIF without evidence of hardware fracture or loosening.     Electronically Signed   By: Margaretha Sheffield M.D.   On: 08/14/2022 15:07  I have personally reviewed the images and agree with the above interpretation.  Assessment and Plan: Jamie [name] is a pleasant 52 y.o. 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  She has had previous radiographic demonstration of a focal kyphosis at L4-5 due to instability and pseudoarthrosis at this level.  She has ongoing back pain from this.  She tried physical therapy without improvement.  At this point, no further conservative management is indicated.  She has previously tried a spinal cord stimulator without improvement.  I think her only option is reinstrumentation at L4-5 with use of infuse to form a solid fusion at L4-5.  She does not have a solid fusion at this time.      I discussed the planned procedure at length with the patient, including the risks, benefits, alternatives, and indications. The risks discussed include but are not limited to bleeding, infection, need for reoperation, spinal fluid leak, stroke, vision loss, anesthetic complication, coma, paralysis, and even death. I also described in detail that improvement was not guaranteed.  The patient expressed understanding of these risks, and asked that we proceed with surgery. I described the surgery in layman's terms, and gave ample opportunity for questions, which were answered to the best of my ability.  I spent a total of 20 minutes in this patient's care today. This time was spent reviewing pertinent records including imaging studies, obtaining and confirming history, performing a directed evaluation, formulating and discussing my recommendations, and documenting the visit within the medical  record.    Thank you for involving me in the care of this patient.      Jamie [name] K. Izora Ribas MD, [facility]

## 2022-10-11 ENCOUNTER — Ambulatory Visit: Payer: 59 | Admitting: Physical Therapy

## 2022-10-11 NOTE — Therapy (Incomplete)
OUTPATIENT PHYSICAL THERAPY THORACOLUMBAR NOTE / RECERT ***   Patient Name: Jamie [name] MRN: [id number] DOB:March 16, 1971, 52 y.o., female Today's Date: 10/11/2022  END OF SESSION:        Past Medical History:  Diagnosis Date   Anxiety    takes Ativan daily.  Panic Attack   Arthritis    Chronic back pain    herniated disc/stenosis/scoliosis   Depression    takes Effexor daily   Eczema    uses a cream daily as needed   Family history of adverse reaction to anesthesia    pta dad is very hard to wake up;excessive nausea   History of bronchitis    > 44yr[age]   Hx of dysplastic nevus 05/10/2020   neck - posterior, Severe Atypia. Shave removal 07/18/2020, margins free   Hypoglycemia    Insomnia    takes Lorazepam nightly   Joint pain    PONV (postoperative nausea and vomiting)    Seasonal allergies    uses Flonase daily   Torn rotator cuff    Vitamin D deficiency    takes Vit D daily   Wears contact lenses    Past Surgical History:  Procedure Laterality Date   BACK SURGERY  2008/2012   laminectomy 1st time and 2nd time laminectomy and fusion   COLONOSCOPY WITH PROPOFOL N/A 05/27/2020   Procedure: COLONOSCOPY WITH PROPOFOL;  Surgeon: TVirgel Manifold MD;  Location: [facility]COPY;  Service: Gastroenterology;  Laterality: N/A;  COVID POSITIVE AUGUST 16   MAXIMUM ACCESS (MAS)POSTERIOR LUMBAR INTERBODY FUSION (PLIF) 2 LEVEL N/A 09/01/2014   Procedure: Lumbar four-five, Lumbar five-Sacral one Maximum Access Surgery posterior lumbar interbody fusion with interbody prosthesis posterior lateral arthrodesis posterior segmental instrumentation;  Surgeon: GElaina Hoops MD;  [facility];  Service: Neurosurgery;  Laterality: N/A;  Lumbar four-five, Lumbar five-Sacral one Maximum Access Surgery posterior lumbar interbody fusion with i   SHOULDER ARTHROSCOPY WITH SUBACROMIAL DECOMPRESSION AND OPEN ROTATOR C Right 08/18/2021   Procedure: Right shoulder arthroscopic rotator  cuff repair, biceps tenodesis, subacromial decompression;  Surgeon: PLeim Fabry MD;  [facility]  Service: Orthopedics;  Laterality: Right;   Patient Active Problem List   Diagnosis Date Noted   Generalized body aches 06/22/2022   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, unspecified ([facility] 02/23/2022   Recurrent major depression-severe ([facility] 10/19/2020   GAD (generalized anxiety disorder) 04/26/2020   Panic attack 04/26/2020   At risk for long QT syndrome 04/26/2020   DDD (degenerative disc disease), lumbar 05/12/2019   Allergic rhinitis 05/11/2019   Primary osteoarthritis of right knee 03/20/2018   Insomnia 08/24/2017   Chronic back pain 10/16/2016   Pseudoarthrosis of lumbar spine 08/11/2015   Eczema 05/09/2015   Spinal stenosis of lumbar region 09/01/2014   Spondylolisthesis 08/03/2014   Displacement of lumbar intervertebral disc without myelopathy 08/03/2014    PCP: JValerie Roys DO   REFERRING PROVIDER: YMeade Maw MD   REFERRING DIAG:  [id number](ICD-10-CM) - Chronic left-sided low back pain with left-sided sciatica  S32.009K (ICD-10-CM) - Pseudoarthrosis of lumbar spine  M25.552 (ICD-10-CM) - Left hip pain    Rationale for Evaluation and Treatment: Rehabilitation  THERAPY DIAG:  No diagnosis found.  ONSET DATE: 07/21/2015  SUBJECTIVE:  SUBJECTIVE STATEMENT: Pt reports slight pain improvements this date. Reports anterior aspect of hips has improved but still having pain in gluteal region with radiating symptoms.   PERTINENT HISTORY:  Pt had laminectomy, fusion and 2 more fusions (more recent). Pt reports pain "never left" but she was unable to prioritize her pain management due to . 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   Pt localizes pain to her back and radiates down her right hip and LE. Pt reports the last time she saw the MD her left hip and groin were very painful but it is feeling better now. Pt feels weak and shaky in her Romilda Joy and feels her core is very weak. Pt infers she may require surgical intervention if PT and hopes PT will help with her strength, tightness and pain prior to any required surgical procedure.   PAIN:  Are you having pain? Yes: NPRS scale: 5/10 Pain location: R side lumbar and gluteal region with some radiation down leg   Pain description: shooting, sharp, stabbing, aching Aggravating factors: prolonged positioning without movement  Relieving factors: movement, zero gravity position in bed, medicine, ice, heat rotating   PRECAUTIONS: None  WEIGHT BEARING RESTRICTIONS: No  FALLS:  Has patient fallen in last 6 months? Yes. Number of falls 2 Pt fell going upstairs when her leg gave out and another time going down the stairs.   LIVING ENVIRONMENT: Lives with: lives with their family and is primary caretaker for her parents and has a lab dog Lives in: House/apartment Stairs: Yes: Internal: 2 flights  steps; can reach both and External: 4 steps; on right going up Has following equipment at home: None  OCCUPATION: Pt works as team lead mostly working at desk where she is able to stand and sit.   PLOF: Independent and Pain associated with all activities but is able to tolerate and complete   PATIENT GOALS: core strength, leg strength  NEXT MD VISIT: None scheduled but will be scheduling following MRI results  OBJECTIVE: from eval unless specified otherwise  DIAGNOSTIC FINDINGS:  Getting MRI 08/11/22  PATIENT SURVEYS:  Modified Oswestry 60  FOTO 43   SCREENING FOR RED FLAGS: Bowel or bladder incontinence: No Spinal tumors: No Cauda equina syndrome: No Compression fracture: No Abdominal aneurysm: No  COGNITION: Overall cognitive status:  Within functional limits for tasks assessed     SENSATION: Light touch: Impaired  on L LE only but able to feel as lower level (ankle and lower calf)   MUSCLE LENGTH: Hamstrings: Right 28 deg; Left 45 deg from full extension  Thomas test: Right 10 deg; Left 40 deg pain limiting   POSTURE: weight shift left  PALPATION: Complete session 2 if pt tolerates.   LUMBAR ROM:   AROM eval  Flexion To knees   Extension No extension tolerable ( moves knees to flexion_   Right lateral flexion To knee  Left lateral flexion Mid thigh   Right rotation 30  Left rotation 26   (Blank rows = not tested)  LOWER EXTREMITY ROM:       Right eval Left eval  Hip flexion  Limited due to pain   Hip extension    Hip abduction    Hip adduction    Hip internal rotation    Hip external rotation    Knee flexion WNL WNL  Knee extension WNL WNL  Ankle dorsiflexion  Ankle plantarflexion    Ankle inversion    Ankle eversion     (Blank rows = not tested)  LOWER EXTREMITY MMT:    MMT Right eval Left eval  Hip flexion 3+ 3+  Hip extension    Hip abduction 4* 4*  Hip adduction 4* 4*  Hip internal rotation    Hip external rotation    Knee flexion 3+ 3+  Knee extension 3+ 3+  Ankle dorsiflexion 4 4  Ankle plantarflexion 4 4  Ankle inversion    Ankle eversion 4 4   (Blank rows = not tested) - Trembling noted in LE with all strength testing  -Asterix indicate pain limited strength -All tested in seated position   LUMBAR SPECIAL TESTS:  Straight leg raise test: Positive, Quadrant test: Positive, FABER test: Negative, and Thomas test: Positive FADIR positive Bilateral with hip and back pain Positive SLR bilateral with more intensity and more sensitive on the left compared to the right   FUNCTIONAL TESTS:  5 times sit to stand: 26.82 sec Pain noted with 5 times sit to stand test GAIT: Distance walked: 60 feet  Assistive device utilized: None Level of assistance: Complete  Independence Comments: knee flexion on right throughout gait, unable to achieve heel strike on this side.   TODAY'S TREATMENT:                                                                                                                              DATE: 10/11/22  On mat table with moist heat applied to low back region for pain relief  TE  Supine knee to chect and knee circumduction stretching, some hip discomfort but no back pain x 45 sec hods and x 5 circles CCW and CW   LTRs x 10 each side within pain-free ROM- needed heat this date due to pain with this activity   TrA activation in hooklye, Cuing for technique with additional tactile feedback.  -marching 2 sets of 10 marches on each side with core activation.  No increase in pain noted  Manual  Sidelying on left with STM and IASTM to relase trigger points and tight bands on the R piriformis and surrounding gluteal musculature, improvement in pain s/s following.  -Pt instructed in proper foam rolling of this area  TE  Supine figure 4 stretch 2 x 45 seconds bilaterally.   PT session ended a few minutes early 2/2 pt needing to get to work at certain time.      PATIENT EDUCATION:  Education details: Pt educated throughout session about proper posture and technique with exercises. Improved exercise technique, movement at target joints, use of target muscles after min to mod verbal, visual, tactile cues. HEP HEP  Person educated: Patient Education method: Explanation, Demonstration, Tactile cues, Verbal cues, and Handouts Education comprehension: verbalized understanding, returned demonstration, and needs further education  HOME EXERCISE PROGRAM: Access Code: [email or web address] URL: [email or web address] Date: 08/14/2022 Prepared by: Ricard Dillon  Exercises -  Supine Transversus Abdominis Bracing - Hands on Stomach  - 1 x daily - 7 x weekly - 2 sets - 10 reps - 5 second  hold - Supine Quad Set  - 1 x daily - 7 x  weekly - 2 sets - 10 reps - 5 sec  hold - Clamshell  - 1 x daily - 7 x weekly - 2 sets - 10 reps - Supine Hip Adduction Isometric with Ball  - 1 x daily - 7 x weekly - 2 sets - 10 reps - 5 seocnd hold - Hooklying Clamshell with Resistance  - 1 x daily - 7 x weekly - 2 sets - 10 reps  ASSESSMENT:  CLINICAL IMPRESSION:  Pt presents with good motivation for completion of PT activities.  Pt shows some progress today with pain in her anterior hips following prescribed hip flexor stretching and pt also shows progress with manual therapy targeting taut bands in piriformis and posterior gluteal musculature and reports improved pain levels following. Pt continuing to complete HEP as prescribed. Pt will continue to benefit from skilled physical therapy intervention to address impairments, improve QOL, and attain therapy goals.     OBJECTIVE IMPAIRMENTS: Abnormal gait, decreased activity tolerance, decreased mobility, difficulty walking, decreased strength, impaired flexibility, impaired sensation, and pain.   ACTIVITY LIMITATIONS: carrying, lifting, bending, sitting, standing, squatting, stairs, and bed mobility  PARTICIPATION LIMITATIONS: cleaning, laundry, driving, shopping, community activity, and yard work  PERSONAL FACTORS: Age and Time since onset of injury/illness/exacerbation are also affecting patient's functional outcome.   REHAB POTENTIAL: Fair Chronicity and pending MRI results for potential surgical intervention   CLINICAL DECISION MAKING: Stable/uncomplicated  EVALUATION COMPLEXITY: Low   GOALS: Goals reviewed with patient? Yes  SHORT TERM GOALS: Target date: 09/06/2022      Patient will be independent in home exercise program to improve strength/mobility for better functional independence with ADLs. Baseline: No HEP currently  Goal status: INITIAL  LONG TERM GOALS: Target date: 10/04/2022    Patient will improve focus on therapeutic outcome score by 7 points or more  indicating improved self-reported pain levels of various activities Baseline: 43 Goal status: INITIAL  2.  Patient will improve modified Oswestry stability index score by 10% or greater indicating improved self-reported pain levels with various activities Baseline: 60 Goal status: INITIAL  3.  Patient will improve bilateral lower extremity strength to 4+ out of 5 or greater with all major planes in knee and hip strength tested in seated positioning Baseline: See eval charts Goal status: INITIAL  4.  Patient will improve supine straight leg raise test on the left side to equal distance to the right number to indicate improved neural tension on her left lower extremity. Baseline: Left lower extremity pain exacerbation in low back and posterior leg very quick to cause pain with supine straight leg raise test Goal status: INITIAL  5.  Patient will improve 5 times sit to stand test to 18 seconds or less indicating improved lower extremity strength and stability as well as improved lower extremity power Baseline: 26.8 sec Goal status: INITIAL  PLAN:  PT FREQUENCY: 2x/week  PT DURATION: 8 weeks  PLANNED INTERVENTIONS: Therapeutic exercises, Therapeutic activity, Neuromuscular re-education, Balance training, Gait training, Patient/Family education, Self Care, Joint mobilization, Stair training, Spinal mobilization, Cryotherapy, Moist heat, Traction, and Dry needling  .  PLAN FOR NEXT SESSION: LE strength and stretches for relief of muscular tightness with focus on not exacerbation pain with the exercises. Continue plan  Particia Lather, PT 10/11/2022, 7:53 AM

## 2022-10-12 ENCOUNTER — Other Ambulatory Visit: Payer: Self-pay

## 2022-10-16 ENCOUNTER — Encounter: Payer: Commercial Managed Care - PPO | Admitting: Physical Therapy

## 2022-10-18 ENCOUNTER — Ambulatory Visit: Payer: 59 | Admitting: Physical Therapy

## 2022-10-23 ENCOUNTER — Encounter: Payer: Commercial Managed Care - PPO | Admitting: Physical Therapy

## 2022-10-24 ENCOUNTER — Other Ambulatory Visit (HOSPITAL_COMMUNITY): Payer: Self-pay

## 2022-10-25 ENCOUNTER — Encounter: Payer: Commercial Managed Care - PPO | Admitting: Physical Therapy

## 2022-10-25 ENCOUNTER — Other Ambulatory Visit: Payer: Self-pay

## 2022-10-26 ENCOUNTER — Other Ambulatory Visit (HOSPITAL_COMMUNITY): Payer: Self-pay

## 2022-10-30 ENCOUNTER — Other Ambulatory Visit (HOSPITAL_COMMUNITY): Payer: Self-pay

## 2022-10-30 ENCOUNTER — Telehealth: Payer: Self-pay

## 2022-10-30 ENCOUNTER — Telehealth: Payer: Self-pay | Admitting: Neurosurgery

## 2022-10-30 NOTE — Telephone Encounter (Signed)
She wants to reschedule her surgery to June 17 if he has any openings. She has other personal issues she needs to take care of before having surgery.  Can we call her back to confirm June 17.

## 2022-10-30 NOTE — Telephone Encounter (Signed)
Patient called asking about Dupixent. She states she has had such great improvement since starting medication until the last month. She currently is flaring and breaking out badly. She is wondering could she have grown a tolerance to it? Is it just not working anymore?  Patient not scheduled to see you until June.

## 2022-11-01 ENCOUNTER — Encounter: Payer: Commercial Managed Care - PPO | Admitting: Physical Therapy

## 2022-11-01 NOTE — Telephone Encounter (Signed)
Left detailed message on voicemail.  

## 2022-11-01 NOTE — Telephone Encounter (Signed)
Can you let her know that Dr Darreld Mclean said that's fine? I also moved her post op appts accordingly. Thanks

## 2022-11-05 ENCOUNTER — Other Ambulatory Visit: Payer: Self-pay

## 2022-11-05 ENCOUNTER — Telehealth (HOSPITAL_BASED_OUTPATIENT_CLINIC_OR_DEPARTMENT_OTHER): Payer: 59 | Admitting: Psychiatry

## 2022-11-05 ENCOUNTER — Encounter (HOSPITAL_COMMUNITY): Payer: Self-pay | Admitting: Psychiatry

## 2022-11-05 DIAGNOSIS — F410 Panic disorder [episodic paroxysmal anxiety] without agoraphobia: Secondary | ICD-10-CM | POA: Diagnosis not present

## 2022-11-05 DIAGNOSIS — F411 Generalized anxiety disorder: Secondary | ICD-10-CM | POA: Diagnosis not present

## 2022-11-05 DIAGNOSIS — F3342 Major depressive disorder, recurrent, in full remission: Secondary | ICD-10-CM

## 2022-11-05 MED ORDER — PAROXETINE HCL 40 MG PO TABS
40.0000 mg | ORAL_TABLET | Freq: Every day | ORAL | 2 refills | Status: DC
  Filled 2022-11-05: qty 30, 30d supply, fill #0
  Filled 2022-12-12: qty 30, 30d supply, fill #1
  Filled 2023-01-15: qty 30, 30d supply, fill #2

## 2022-11-05 MED ORDER — NORTRIPTYLINE HCL 10 MG PO CAPS
10.0000 mg | ORAL_CAPSULE | Freq: Every day | ORAL | 2 refills | Status: DC
  Filled 2022-11-05: qty 30, 30d supply, fill #0
  Filled 2022-12-12: qty 30, 30d supply, fill #1
  Filled 2023-01-16: qty 30, 30d supply, fill #2

## 2022-11-05 MED ORDER — LAMOTRIGINE 200 MG PO TABS
200.0000 mg | ORAL_TABLET | Freq: Every day | ORAL | 2 refills | Status: DC
  Filled 2022-11-05: qty 30, 30d supply, fill #0
  Filled 2022-12-12: qty 30, 30d supply, fill #1
  Filled 2023-01-16: qty 30, 30d supply, fill #2
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
## 2022-11-06 ENCOUNTER — Encounter: Payer: Commercial Managed Care - PPO | Admitting: Physical Therapy

## 2022-11-07 ENCOUNTER — Ambulatory Visit
Admission: RE | Admit: 2022-11-07 | Discharge: 2022-11-07 | Disposition: A | Discharge status: Home / Self Care | Payer: PRIVATE HEALTH INSURANCE | Source: Ambulatory Visit | Attending: Physician Assistant | Admitting: Physician Assistant

## 2022-11-07 ENCOUNTER — Other Ambulatory Visit: Payer: Self-pay | Admitting: Physician Assistant

## 2022-11-07 DIAGNOSIS — S34109A Unspecified injury to unspecified level of lumbar spinal cord, initial encounter: Secondary | ICD-10-CM

## 2022-11-07 DIAGNOSIS — S3992XA Unspecified injury of lower back, initial encounter: Secondary | ICD-10-CM

## 2022-11-08 ENCOUNTER — Other Ambulatory Visit (HOSPITAL_COMMUNITY): Payer: Self-pay

## 2022-11-08 ENCOUNTER — Encounter: Payer: Commercial Managed Care - PPO | Admitting: Physical Therapy

## 2022-11-09 ENCOUNTER — Other Ambulatory Visit: Payer: Self-pay

## 2022-11-09 ENCOUNTER — Telehealth (HOSPITAL_COMMUNITY): Payer: Self-pay | Admitting: *Deleted

## 2022-11-09 DIAGNOSIS — F410 Panic disorder [episodic paroxysmal anxiety] without agoraphobia: Secondary | ICD-10-CM

## 2022-11-09 MED ORDER — LORAZEPAM 0.5 MG PO TABS: 0.5000 mg | ORAL_TABLET | Freq: Every day | ORAL | 0 refills | Status: DC | PRN

## 2022-11-09 NOTE — Telephone Encounter (Signed)
Pt called requesting refill of Lorazepam 0.5 mg as she is going through a "really tough time" with her job. Pt was terful and distressed. Last script sent on 09/13/22 for #10-1 PRN. Next appointment scheduled for 01/31/23. Please review.

## 2022-11-09 NOTE — Telephone Encounter (Signed)
Will provide 10 tab one time only. We increased Lamictal recently. She is also taking Paxil and Nortriptyline.

## 2022-11-11 ENCOUNTER — Other Ambulatory Visit: Payer: Self-pay

## 2022-11-12 ENCOUNTER — Other Ambulatory Visit: Payer: Self-pay

## 2022-11-12 ENCOUNTER — Other Ambulatory Visit (HOSPITAL_COMMUNITY): Payer: Self-pay

## 2022-11-12 MED ORDER — MORPHINE SULFATE ER 30 MG PO TBCR
30.0000 mg | EXTENDED_RELEASE_TABLET | Freq: Every morning | ORAL | 0 refills | Status: DC
  Filled 2022-11-12: qty 28, 28d supply, fill #0

## 2022-11-12 MED ORDER — OXYCODONE HCL 15 MG PO TABS: 15.0000 mg | ORAL_TABLET | Freq: Four times a day (QID) | ORAL | 0 refills | Status: DC | PRN

## 2022-11-13 ENCOUNTER — Encounter: Payer: Commercial Managed Care - PPO | Admitting: Physical Therapy

## 2022-11-15 ENCOUNTER — Other Ambulatory Visit (HOSPITAL_COMMUNITY): Payer: Self-pay

## 2022-11-15 ENCOUNTER — Encounter: Payer: Commercial Managed Care - PPO | Admitting: Physical Therapy

## 2022-11-20 ENCOUNTER — Encounter: Payer: Commercial Managed Care - PPO | Admitting: Physical Therapy

## 2022-11-20 ENCOUNTER — Other Ambulatory Visit (HOSPITAL_COMMUNITY): Payer: Self-pay

## 2022-11-21 ENCOUNTER — Other Ambulatory Visit: Payer: Self-pay

## 2022-11-21 DIAGNOSIS — Z6826 Body mass index (BMI) 26.0-26.9, adult: Secondary | ICD-10-CM | POA: Diagnosis not present

## 2022-11-21 DIAGNOSIS — M542 Cervicalgia: Secondary | ICD-10-CM | POA: Diagnosis not present

## 2022-11-21 DIAGNOSIS — M961 Postlaminectomy syndrome, not elsewhere classified: Secondary | ICD-10-CM | POA: Diagnosis not present

## 2022-11-21 DIAGNOSIS — F1120 Opioid dependence, uncomplicated: Secondary | ICD-10-CM | POA: Diagnosis not present

## 2022-11-21 MED ORDER — MORPHINE SULFATE ER 30 MG PO TBCR
30.0000 mg | EXTENDED_RELEASE_TABLET | Freq: Every morning | ORAL | 0 refills | Status: DC
  Filled 2023-01-09: qty 28, 28d supply, fill #0

## 2022-11-21 MED ORDER — OXYCODONE HCL 15 MG PO TABS
15.0000 mg | ORAL_TABLET | Freq: Four times a day (QID) | ORAL | 0 refills | Status: DC | PRN
  Filled 2023-01-16 – 2023-01-17 (×3): qty 112, 28d supply, fill #0

## 2022-11-21 MED ORDER — OXYCODONE HCL 15 MG PO TABS
15.0000 mg | ORAL_TABLET | Freq: Four times a day (QID) | ORAL | 0 refills | Status: DC | PRN
  Filled 2022-12-20: qty 112, 28d supply, fill #0

## 2022-11-21 MED ORDER — OXYCODONE HCL 15 MG PO TABS
15.0000 mg | ORAL_TABLET | Freq: Four times a day (QID) | ORAL | 0 refills | Status: DC | PRN
  Filled 2022-11-22: qty 112, 28d supply, fill #0

## 2022-11-21 MED ORDER — MORPHINE SULFATE ER 30 MG PO TBCR
30.0000 mg | EXTENDED_RELEASE_TABLET | Freq: Every morning | ORAL | 0 refills | Status: DC
  Filled 2022-12-12: qty 28, 28d supply, fill #0

## 2022-11-22 ENCOUNTER — Other Ambulatory Visit: Payer: Self-pay

## 2022-11-22 ENCOUNTER — Encounter: Payer: Commercial Managed Care - PPO | Admitting: Physical Therapy

## 2022-11-23 ENCOUNTER — Other Ambulatory Visit: Payer: Self-pay

## 2022-11-23 ENCOUNTER — Other Ambulatory Visit (HOSPITAL_COMMUNITY): Payer: Self-pay

## 2022-11-27 ENCOUNTER — Encounter: Payer: Commercial Managed Care - PPO | Admitting: Physical Therapy

## 2022-11-29 ENCOUNTER — Telehealth: Payer: Self-pay | Admitting: Neurosurgery

## 2022-11-29 ENCOUNTER — Encounter: Payer: Commercial Managed Care - PPO | Admitting: Physical Therapy

## 2022-11-29 NOTE — Telephone Encounter (Signed)
Notified the patient via voicemail 11/29/22 @ 2:29p

## 2022-11-29 NOTE — Telephone Encounter (Signed)
Please let patient know the handicap form has been filled out and placed at front desk for patient pick up

## 2022-11-29 NOTE — Telephone Encounter (Signed)
Patient left message on office voice mail 11/28/22 4:35pm L4-5 fusion on 02/04/23  She is asking if Dr.Yarbrough would fill out a handicap placard for her. Sometimes she has a hard time walking along way. Please call her back

## 2022-12-04 ENCOUNTER — Encounter: Payer: Commercial Managed Care - PPO | Admitting: Physical Therapy

## 2022-12-06 ENCOUNTER — Encounter: Payer: Commercial Managed Care - PPO | Admitting: Physical Therapy

## 2022-12-12 ENCOUNTER — Other Ambulatory Visit: Payer: Self-pay

## 2022-12-18 ENCOUNTER — Other Ambulatory Visit: Payer: Self-pay

## 2022-12-18 ENCOUNTER — Encounter: Payer: Self-pay | Admitting: Neurosurgery

## 2022-12-18 DIAGNOSIS — Z01818 Encounter for other preprocedural examination: Secondary | ICD-10-CM

## 2022-12-20 ENCOUNTER — Other Ambulatory Visit: Payer: Self-pay

## 2022-12-21 ENCOUNTER — Other Ambulatory Visit (HOSPITAL_COMMUNITY): Payer: Self-pay

## 2022-12-21 ENCOUNTER — Other Ambulatory Visit: Payer: Self-pay | Admitting: Dermatology

## 2022-12-21 DIAGNOSIS — L309 Dermatitis, unspecified: Secondary | ICD-10-CM

## 2022-12-24 ENCOUNTER — Other Ambulatory Visit: Payer: Self-pay

## 2022-12-24 ENCOUNTER — Other Ambulatory Visit: Payer: Self-pay | Admitting: Pharmacist

## 2022-12-24 ENCOUNTER — Other Ambulatory Visit (HOSPITAL_COMMUNITY): Payer: Self-pay

## 2022-12-24 DIAGNOSIS — L309 Dermatitis, unspecified: Secondary | ICD-10-CM

## 2022-12-24 MED ORDER — DUPIXENT 300 MG/2ML ~~LOC~~ SOAJ
300.0000 mg | SUBCUTANEOUS | 5 refills | Status: DC
Start: 2022-12-24 — End: 2023-01-07
  Filled 2022-12-24: qty 4, 28d supply, fill #0

## 2022-12-24 MED ORDER — DUPIXENT 300 MG/2ML ~~LOC~~ SOAJ
300.0000 mg | SUBCUTANEOUS | 5 refills | Status: DC
  Filled 2022-12-24: qty 4, 28d supply, fill #0

## 2022-12-25 ENCOUNTER — Other Ambulatory Visit: Payer: Self-pay

## 2022-12-26 ENCOUNTER — Encounter: Payer: Self-pay | Admitting: Neurosurgery

## 2022-12-28 ENCOUNTER — Encounter: Payer: Self-pay | Admitting: Family Medicine

## 2022-12-28 ENCOUNTER — Ambulatory Visit (INDEPENDENT_AMBULATORY_CARE_PROVIDER_SITE_OTHER): Payer: 59 | Admitting: Family Medicine

## 2022-12-28 VITALS — BP 112/70 | HR 81 | Temp 98.3°F | Ht 67.0 in | Wt 177.1 lb

## 2022-12-28 DIAGNOSIS — Z01818 Encounter for other preprocedural examination: Secondary | ICD-10-CM

## 2022-12-28 NOTE — Progress Notes (Signed)
Surgery is 25 days away- will get her back for clearance within 30 days.

## 2022-12-28 NOTE — Progress Notes (Signed)
   LMP 06/20/2016 (Approximate)    Subjective:    Patient ID: Jamie [name], female    DOB: May 02, 1971, 52 y.o.   MRN: [id number]  HPI: Jamie [name] is a 52 y.o. female  Chief Complaint  Patient presents with   Medical Clearance    Relevant past medical, surgical, family and social history reviewed and updated as indicated. Interim medical history since our last visit reviewed. Allergies and medications reviewed and updated.  Review of Systems  Per HPI unless specifically indicated above     Objective:    LMP 06/20/2016 (Approximate)   Wt Readings from Last 3 Encounters:  10/09/22 175 lb 12.8 oz (79.7 kg)  07/31/22 168 lb 9.6 oz (76.5 kg)  06/22/22 160 lb (72.6 kg)    Physical Exam  Results for orders placed or performed during the hospital encounter of 06/22/22  Resp Panel by RT-PCR (Flu A&B, Covid) Anterior Nasal Swab   Specimen: Anterior Nasal Swab  Result Value Ref Range   SARS Coronavirus 2 by RT PCR NEGATIVE NEGATIVE   Influenza A by PCR NEGATIVE NEGATIVE   Influenza B by PCR NEGATIVE NEGATIVE      Assessment & Plan:   Problem List Items Addressed This Visit   None Visit Diagnoses     Preop exam for internal medicine    -  Primary   Relevant Orders   CBC with Differential/Platelet   CoaguChek XS/INR Waived   Basic metabolic panel   EKG 12-Lead        Follow up plan: No follow-ups on file.

## 2023-01-07 ENCOUNTER — Other Ambulatory Visit: Payer: Self-pay | Admitting: Pharmacist

## 2023-01-07 ENCOUNTER — Ambulatory Visit: Payer: 59 | Admitting: Dermatology

## 2023-01-07 ENCOUNTER — Other Ambulatory Visit: Payer: Self-pay

## 2023-01-07 ENCOUNTER — Other Ambulatory Visit (HOSPITAL_COMMUNITY): Payer: Self-pay

## 2023-01-07 VITALS — BP 132/81

## 2023-01-07 DIAGNOSIS — L309 Dermatitis, unspecified: Secondary | ICD-10-CM | POA: Diagnosis not present

## 2023-01-07 DIAGNOSIS — Z79899 Other long term (current) drug therapy: Secondary | ICD-10-CM

## 2023-01-07 DIAGNOSIS — L2089 Other atopic dermatitis: Secondary | ICD-10-CM

## 2023-01-07 MED ORDER — CLOBETASOL PROPIONATE 0.05 % EX CREA
1.0000 | TOPICAL_CREAM | CUTANEOUS | 3 refills | Status: AC
  Filled 2023-01-07: qty 45, 30d supply, fill #0

## 2023-01-07 MED ORDER — DUPIXENT 300 MG/2ML ~~LOC~~ SOAJ
300.0000 mg | SUBCUTANEOUS | 5 refills | Status: DC
Start: 2023-01-07 — End: 2023-07-08
  Filled 2023-01-07 – 2023-01-22 (×2): qty 4, 28d supply, fill #0
  Filled 2023-02-18: qty 4, 28d supply, fill #1
  Filled 2023-03-21: qty 4, 28d supply, fill #2
  Filled 2023-04-17: qty 4, 28d supply, fill #3
  Filled 2023-05-15: qty 4, 28d supply, fill #4
  Filled 2023-06-11: qty 4, 28d supply, fill #5

## 2023-01-07 MED ORDER — DUPIXENT 300 MG/2ML ~~LOC~~ SOAJ
300.0000 mg | SUBCUTANEOUS | 5 refills | Status: DC
Start: 2023-01-07 — End: 2023-01-07
  Filled 2023-01-07: qty 4, 28d supply, fill #0

## 2023-01-07 MED ORDER — TACROLIMUS 0.1 % EX OINT
TOPICAL_OINTMENT | Freq: Every day | CUTANEOUS | 3 refills | Status: AC
  Filled 2023-01-07: qty 100, 30d supply, fill #0

## 2023-01-07 NOTE — Progress Notes (Signed)
   Follow-Up Visit   Subjective  Jamie [name] is a 52 y.o. female who presents for the following: Hand/foot dermatitis, hands, feet, Dupixent sq injections q 2wks, Tacrolimus 0.1% oint prn flares, pt feels like she is having breakthrough flares on hands a week after injection, hands itchy   The following portions of the chart were reviewed this encounter and updated as appropriate: medications, allergies, medical history  Review of Systems:  No other skin or systemic complaints except as noted in HPI or Assessment and Plan.  Objective  Well appearing patient in no apparent distress; mood and affect are within normal limits.   A focused examination was performed of the following areas: Hands, feet  Relevant exam findings are noted in the Assessment and Plan.    Assessment & Plan   HAND/FOOT DERMATITIS  Exam: Mild erythema with hyperkeratosis bil palms  Chronic and persistent condition with duration or expected duration over one year. Condition is symptomatic/ bothersome to patient. Not currently at goal.   Treatment Plan: Cont Dupixent sq injections q 2 wks Increase Tacrolimus 0.1% ointment to qhs When start to flare recommend starting Clobetasol cr qd until clear, then prn flares, avoid face, groin, axilla Recommend starting Allegra or Zyrtec qam  Hand Dermatitis is a chronic type of eczema that can come and go on the hands and fingers.  While there is no cure, the rash and symptoms can be managed with topical prescription medications, and for more severe cases, with systemic medications.  Recommend mild soap and routine use of moisturizing cream after handwashing.  Minimize soap/water exposure when possible.     Dupilumab (Dupixent) is a treatment given by injection for adults and children with moderate-to-severe atopic dermatitis. Goal is control of skin condition, not cure. It is given as 2 injections at the first dose followed by 1 injection ever 2 weeks thereafter.  Young  children are dosed monthly.  Potential side effects include allergic reaction, herpes infections, injection site reactions and conjunctivitis (inflammation of the eyes).  The use of Dupixent requires long term medication management, including periodic office visits.      Return in about 6 months (around 07/10/2023) for hand derm.  I, Ardis Rowan, RMA, am acting as scribe for Willeen Niece, MD .   Documentation: I have reviewed the above documentation for accuracy and completeness, and I agree with the above.  Willeen Niece, MD

## 2023-01-07 NOTE — Patient Instructions (Addendum)
Continue Dupixent  injections every 2 weeks Increase Tacrolimus ointment to nightly When start to flare recommend starting Clobetasol cream once a day until clear, then as needed for flares, avoid face, groin, axilla  Recommend starting Allegra or Zyrtec in the morning For Hives (urticaria); dermatographism or Itch: Start non sedating antihistamine (either Allegra 180mg , or Claritin 10mg , or Zyrtec 10mg ) daily.  All these are non-prescription ("Over the Counter").   Start out with 1 pill a day.   After a week if not improving may increase to 2 pills a day.   After another week if not improving may increase to 3 pills a day.   After another week if still not improving may take up to 4 pills a day. Stay at highest dose that keeps condition controlled, but only up to 4 pills a day. Stay at the controlling dose for at least 2 weeks. Contact office if taking 4 pills of antihistamine a day for at least 2 weeks without control of condition as other options may be available.     Due to recent changes in healthcare laws, you may see results of your pathology and/or laboratory studies on MyChart before the doctors have had a chance to review them. We understand that in some cases there may be results that are confusing or concerning to you. Please understand that not all results are received at the same time and often the doctors may need to interpret multiple results in order to provide you with the best plan of care or course of treatment. Therefore, we ask that you please give [place] 2 business days to thoroughly review all your results before contacting the office for clarification. Should we see a critical lab result, you will be contacted sooner.   If You Need Anything After Your Visit  If you have any questions or concerns for your doctor, please call our main line at [phone] and press option 4 to reach your doctor's medical assistant. If no one answers, please leave a voicemail as directed and  we will return your call as soon as possible. Messages left after 4 pm will be answered the following business day.   You may also send [place] a message via MyChart. We typically respond to MyChart messages within 1-2 business days.  For prescription refills, please ask your pharmacy to contact our office. Our fax number is [phone].  If you have an urgent issue when the clinic is closed that cannot wait until the next business day, you can page your doctor at the number below.    Please note that while we do our best to be available for urgent issues outside of office hours, we are not available 24/7.   If you have an urgent issue and are unable to reach [place], you may choose to seek medical care at your doctor's office, retail clinic, urgent care center, or emergency room.  If you have a medical emergency, please immediately call 911 or go to the emergency department.  Pager Numbers  - Dr. Gwen Pounds: [phone]  - Dr. Neale Burly: [phone]  - Dr. Roseanne Reno: [phone]  In the event of inclement weather, please call our main line at [phone] for an update on the status of any delays or closures.  Dermatology Medication Tips: Please keep the boxes that topical medications come in in order to help keep track of the instructions about where and how to use these. Pharmacies typically print the medication instructions only on the boxes and not directly on the  medication tubes.   If your medication is too expensive, please contact our office at [phone] option 4 or send [place] a message through MyChart.   We are unable to tell what your co-pay for medications will be in advance as this is different depending on your insurance coverage. However, we may be able to find a substitute medication at lower cost or fill out paperwork to get insurance to cover a needed medication.   If a prior authorization is required to get your medication covered by your insurance company, please allow [place] 1-2  business days to complete this process.  Drug prices often vary depending on where the prescription is filled and some pharmacies may offer cheaper prices.  The website www.goodrx.com contains coupons for medications through different pharmacies. The prices here do not account for what the cost may be with help from insurance (it may be cheaper with your insurance), but the website can give you the price if you did not use any insurance.  - You can print the associated coupon and take it with your prescription to the pharmacy.  - You may also stop by our office during regular business hours and pick up a GoodRx coupon card.  - If you need your prescription sent electronically to a different pharmacy, notify our office through [facility] or by phone at [phone] option 4.     Si Usted Necesita Algo Despus de Su Visita  Tambin puede enviarnos un mensaje a travs de Clinical cytogeneticist. Por lo general respondemos a los mensajes de MyChart en el transcurso de 1 a 2 das hbiles.  Para renovar recetas, por favor pida a su farmacia que se ponga en contacto con nuestra oficina. Annie Sable de fax es [place] [phone].  Si tiene un asunto urgente cuando la clnica est cerrada y que no puede esperar hasta el siguiente da hbil, puede llamar/localizar a su doctor(a) al nmero que aparece a continuacin.   Por favor, tenga en cuenta que aunque hacemos todo lo posible para estar disponibles para asuntos urgentes fuera del horario de [place], no estamos disponibles las 24 horas del da, los 7 [address]  [address] de la [place].   Si tiene un problema urgente y no puede comunicarse con nosotros, puede optar por buscar atencin mdica  en el consultorio de su doctor(a), en una clnica privada, en un centro de atencin urgente o en una sala de emergencias.  Si tiene Engineer, drilling, por favor llame inmediatamente al 911 o vaya a la sala de emergencias.  Nmeros de bper  - Dr. Gwen Pounds: [phone]  - Dra.  Moye: [phone]  - Dra. Roseanne Reno: [phone]  En caso de inclemencias del [place], por favor llame a Lacy Duverney principal al [phone] para una actualizacin sobre el [place] de cualquier retraso o cierre.  Consejos para la medicacin en dermatologa: Por favor, guarde las cajas en las que vienen los medicamentos de uso tpico para ayudarle a seguir las instrucciones sobre dnde y cmo usarlos. Las farmacias generalmente imprimen las instrucciones del medicamento slo en las cajas y no directamente en los tubos del [place].   Si su medicamento es muy caro, por favor, pngase en contacto con Rolm Gala llamando al [phone] y presione la opcin 4 o envenos un mensaje a travs de Clinical cytogeneticist.   No podemos decirle cul ser su copago por los medicamentos por adelantado ya que esto es diferente dependiendo de la cobertura de su seguro. Sin embargo, es posible que podamos encontrar un medicamento sustituto a Adult nurse  costo o llenar un formulario para que el seguro cubra el medicamento que se considera necesario.   Si se requiere una autorizacin previa para que su compaa de seguros [place] su medicamento, por favor permtanos de 1 a 2 das hbiles para completar [address]  Los precios de los medicamentos varan con frecuencia dependiendo del Environmental consultant de dnde se surte la receta y alguna farmacias pueden ofrecer precios ms baratos.  El sitio web www.goodrx.com tiene cupones para medicamentos de Health and safety inspector. Los precios aqu no tienen en cuenta lo que podra costar con la ayuda del seguro (puede ser ms barato con su seguro), pero el sitio web puede darle el precio si no utiliz Tourist information centre manager.  - Puede imprimir el cupn correspondiente y llevarlo con su receta a la farmacia.  - Tambin puede pasar por nuestra oficina durante el horario de atencin regular y Education officer, museum una tarjeta de cupones de GoodRx.  - Si necesita que su receta se enve electrnicamente a una farmacia diferente,  informe a nuestra oficina a travs de MyChart de [place] o por telfono llamando al [phone] y presione la opcin 4.

## 2023-01-08 ENCOUNTER — Other Ambulatory Visit: Payer: Self-pay

## 2023-01-09 ENCOUNTER — Other Ambulatory Visit: Payer: Self-pay

## 2023-01-15 ENCOUNTER — Encounter: Payer: Self-pay | Admitting: Neurosurgery

## 2023-01-15 ENCOUNTER — Other Ambulatory Visit: Payer: Self-pay

## 2023-01-16 ENCOUNTER — Other Ambulatory Visit: Payer: Self-pay

## 2023-01-17 ENCOUNTER — Other Ambulatory Visit: Payer: Self-pay

## 2023-01-18 ENCOUNTER — Ambulatory Visit: Payer: 59 | Admitting: Family Medicine

## 2023-01-18 ENCOUNTER — Other Ambulatory Visit: Payer: Self-pay

## 2023-01-18 VITALS — BP 116/73 | HR 83 | Temp 98.5°F | Ht 67.0 in | Wt 173.8 lb

## 2023-01-18 DIAGNOSIS — Z01818 Encounter for other preprocedural examination: Secondary | ICD-10-CM | POA: Diagnosis not present

## 2023-01-18 DIAGNOSIS — Z9889 Other specified postprocedural states: Secondary | ICD-10-CM | POA: Diagnosis not present

## 2023-01-18 DIAGNOSIS — R112 Nausea with vomiting, unspecified: Secondary | ICD-10-CM

## 2023-01-18 LAB — COAGUCHEK XS/INR WAIVED
INR: 1 (ref 0.9–1.1)
Prothrombin Time: 11.8 s

## 2023-01-18 MED ORDER — FLUTICASONE PROPIONATE 50 MCG/ACT NA SUSP
1.0000 | Freq: Every day | NASAL | 12 refills | Status: DC | PRN
  Filled 2023-01-18: qty 16, 60d supply, fill #0

## 2023-01-18 NOTE — Progress Notes (Signed)
BP 116/73   Pulse 83   Temp 98.5 F (36.9 C) (Oral)   Ht 5\' 7"  (1.702 m)   Wt 173 lb 12.8 oz (78.8 kg)   LMP 06/20/2016 (Approximate)   SpO2 98%   BMI 27.22 kg/m    Subjective:    Patient ID: Jamie [name], female    DOB: [birth year], 52 y.o.   MRN: [id number]  HPI: Jamie [name] is a 52 y.o. female  Chief Complaint  Patient presents with   Pre-op Exam   Solie presents today for surgical clearance to have a L4-5 fusion to be done 02/04/23. She has had numerous surgeries in the past. Her last one was in 2022 when she had her rotator cuff surgery. She has never had any problems with surgery. She has never had any bad reactions to anesthesia- although she does have post-op nausea and vomiting. She has no family history of problems with anesthesia or malignant hyperthermia. She has never had any problems with extubation. She has always gone home when she was supposed to after surgery and has never had any complications. She is generally feeling OK. Her back has been hurting a lot and she is struggling with her depression. She denies any CP or SOB. She could walk up 3 flights of stairs or 2 blocks without any difficulty. No other concerns or complaints at this time.    Active Ambulatory Problems    Diagnosis Date Noted   Spinal stenosis of lumbar region 09/01/2014   Eczema 05/09/2015   Pseudoarthrosis of lumbar spine 08/11/2015   Chronic back pain 10/16/2016   Insomnia 08/24/2017   Allergic rhinitis 05/11/2019   Primary osteoarthritis of right knee 03/20/2018   GAD (generalized anxiety disorder) 04/26/2020   Panic attack 04/26/2020   At risk for long QT syndrome 04/26/2020   Recurrent major depression-severe (HCC) 10/19/2020   Spondylolisthesis 08/03/2014   Displacement of lumbar intervertebral disc without myelopathy 08/03/2014   DDD (degenerative disc disease), lumbar 05/12/2019   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, 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   Generalized body aches 06/22/2022    Resolved Ambulatory Problems    Diagnosis Date Noted   Depression, recurrent (HCC) 03/08/2015   Anxiety 05/09/2015   COVID-19 04/14/2020   MDD (major depressive disorder), recurrent episode, moderate (HCC) 04/26/2020   High risk medication use 04/26/2020   Encounter for screening colonoscopy    Left wrist pain 10/25/2020   Elevated blood-pressure reading, without diagnosis of hypertension 10/26/2019   Chronic low back pain 01/05/2015   Cough 08/23/2021   Past Medical History:  Diagnosis Date   Arthritis    Depression    Family history of adverse reaction to anesthesia    History of bronchitis    Hx of dysplastic nevus 05/10/2020   Hypoglycemia    Joint pain    PONV (postoperative nausea and vomiting)    Seasonal allergies    Torn rotator cuff    Vitamin D deficiency    Wears contact lenses    Past Surgical History:  Procedure Laterality Date   BACK SURGERY  2008/2012   laminectomy 1st time and 2nd time laminectomy and fusion   COLONOSCOPY WITH PROPOFOL N/A 05/27/2020   Procedure: COLONOSCOPY WITH PROPOFOL;  Surgeon: Pasty Spillers, MD;  Location: [facility];  Service: Gastroenterology;  Laterality: N/A;  COVID POSITIVE AUGUST 16   MAXIMUM ACCESS (MAS)POSTERIOR LUMBAR INTERBODY FUSION (PLIF) 2 LEVEL N/A 09/01/2014   Procedure: Lumbar four-five, Lumbar five-Sacral one Maximum Access Surgery posterior lumbar interbody fusion with interbody  prosthesis posterior lateral arthrodesis posterior segmental instrumentation;  Surgeon: Mariam Dollar, MD;  Location: [facility];  Service: Neurosurgery;  Laterality: N/A;  Lumbar four-five, Lumbar five-Sacral one Maximum Access Surgery posterior lumbar interbody fusion with i   SHOULDER ARTHROSCOPY WITH SUBACROMIAL DECOMPRESSION AND OPEN ROTATOR C Right 08/18/2021   Procedure: Right shoulder arthroscopic rotator cuff repair, biceps tenodesis, subacromial decompression;  Surgeon: Signa Kell, MD;  Location: [facility];   Service: Orthopedics;  Laterality: Right;    Outpatient Encounter Medications as of 01/18/2023  Medication Sig   Ascorbic Acid (VITAMIN C PO) Take by mouth.   cholecalciferol (VITAMIN D) 1000 UNITS tablet Take 1,000 Units by mouth daily.   clobetasol cream (TEMOVATE) 0.05 % Apply 1 Application topically as directed once daily to affected areas of  hands and feet as needed for flares, avoid face, groin, axilla.   Dupilumab (DUPIXENT) 300 MG/2ML SOPN Inject 300 mg into the skin every 14 (fourteen) days.   gabapentin (NEURONTIN) 600 MG tablet TAKE 1 TABLET BY MOUTH THREE TIMES DAILY   Halobetasol Prop-Tazarotene (DUOBRII) 0.01-0.045 % LOTN Apply 1 application topically at bedtime. qhs to aa rash on hands and feet until clear, then prn flares, avoid face, groin, axilla   lamoTRIgine (LAMICTAL) 200 MG tablet Take 1 tablet (200 mg total) by mouth daily.   LORazepam (ATIVAN) 0.5 MG tablet Take 1 tablet (0.5 mg total) by mouth daily as needed for anxiety. Do not take within 6 hours of your pain medicine   meloxicam (MOBIC) 7.5 MG tablet Take 1 tablet (7.5 mg total) by mouth 2 (two) times daily.   metaxalone (SKELAXIN) 800 MG tablet Take 1 tablet (800 mg total) by mouth 4 (four) times daily as needed.   montelukast (SINGULAIR) 10 MG tablet TAKE 1 TABLET BY MOUTH AT BEDTIME   morphine (MS CONTIN) 30 MG 12 hr tablet Take 1 tablet (30 mg total) by mouth every morning. (DNF 12/10/22)   morphine (MS CONTIN) 30 MG 12 hr tablet Take 1 tablet (30 mg total) by mouth every morning. (DNF 01/07/23)   Multiple Vitamin (MULTIVITAMIN ADULT PO) Take by mouth daily.   nortriptyline (PAMELOR) 10 MG capsule Take 1 capsule (10 mg total) by mouth at bedtime.   oxyCODONE (ROXICODONE) 15 MG immediate release tablet Take 1 tablet (15 mg total) by mouth every 6 (six) hours as needed for breakthrough pain (DNF 12/20/22)   oxyCODONE (ROXICODONE) 15 MG immediate release tablet Take 1 tablet (15 mg total) by mouth every 6 (six) hours as  needed for breakthrough pain (DNF 11/22/22)   oxyCODONE (ROXICODONE) 15 MG immediate release tablet Take 1 tablet (15 mg total) by mouth every 6 (six) hours as needed for breakthrough pain (DNF 01/17/23)   PARoxetine (PAXIL) 40 MG tablet Take 1 tablet (40 mg total) by mouth daily.   tacrolimus (PROTOPIC) 0.1 % ointment Apply topically daily at bedtime to hands and feet   Tavaborole 5 % SOLN Apply qhs to affected toenails   [DISCONTINUED] fluticasone (FLONASE) 50 MCG/ACT nasal spray Place 1 spray into both nostrils daily as needed for allergies.   fluticasone (FLONASE) 50 MCG/ACT nasal spray Place 1 spray into both nostrils daily as needed for allergies.   No facility-administered encounter medications on file as of 01/18/2023.   Allergies  Allergen Reactions   Amoxicillin Diarrhea, Nausea And Vomiting and Shortness Of Breath   Shellfish Allergy Hives   Flexeril [Cyclobenzaprine] Hives    RAPID HEARTBEAT   Social History  Socioeconomic History   Marital status: Divorced    Spouse name: Not on file   Number of children: Not on file   Years of education: Not on file   Highest education level: Bachelor's degree (e.g., BA, AB, BS)  Occupational History   Not on file  Tobacco Use   Smoking status: Former    Packs/day: 0.50    Years: 20.00    Additional pack years: 0.00    Total pack years: 10.00    Types: Cigarettes    Quit date: 10/18/2016    Years since quitting: 6.2   Smokeless tobacco: Never  Vaping Use   Vaping Use: Never used  Substance and Sexual Activity   Alcohol use: No   Drug use: No   Sexual activity: Not Currently  Other Topics Concern   Not on file  Social History Narrative   Not on file   Social Determinants of Health   Financial Resource Strain: Low Risk  (01/18/2023)   Overall Financial Resource Strain (CARDIA)    Difficulty of Paying Living Expenses: Not very hard  Food Insecurity: No Food Insecurity (01/18/2023)   Hunger Vital Sign    Worried About Running  Out of Food in the Last Year: Never true    Ran Out of Food in the Last Year: Never true  Transportation Needs: No Transportation Needs (01/18/2023)   PRAPARE - Administrator, Civil Service (Medical): No    Lack of Transportation (Non-Medical): No  Physical Activity: Insufficiently Active (01/18/2023)   Exercise Vital Sign    Days of Exercise per Week: 3 days    Minutes of Exercise per Session: 30 min  Stress: Stress Concern Present (01/18/2023)   [employer] of Occupational Health - Occupational Stress Questionnaire    Feeling of Stress : Very much  Social Connections: Moderately Integrated (01/18/2023)   Social Connection and Isolation Panel [NHANES]    Frequency of Communication with Friends and Family: More than three times a week    Frequency of Social Gatherings with Friends and Family: Once a week    Attends Religious Services: More than 4 times per year    Active Member of [employer] or Organizations: Yes    Attends Engineer, structural: More than 4 times per year    Marital Status: Separated   Family History  Problem Relation Age of Onset   Arthritis Mother    Hyperlipidemia Mother    Migraines Mother    Arthritis Father    Diabetes Father    Heart disease Father    Atrial fibrillation Father    Diabetes Brother    Breast cancer Maternal Grandmother    Mental illness Neg Hx     Review of Systems  Constitutional: Negative.   Respiratory: Negative.    Cardiovascular: Negative.   Gastrointestinal: Negative.   Musculoskeletal:  Positive for back pain and myalgias. Negative for arthralgias, gait problem, joint swelling, neck pain and neck stiffness.  Skin: Negative.   Psychiatric/Behavioral:  Positive for dysphoric mood. Negative for agitation, behavioral problems, confusion, decreased concentration, hallucinations, self-injury, sleep disturbance and suicidal ideas. The patient is nervous/anxious. The patient is not hyperactive.     Per HPI unless  specifically indicated above     Objective:    BP 116/73   Pulse 83   Temp 98.5 F (36.9 C) (Oral)   Ht 5\' 7"  (1.702 m)   Wt 173 lb 12.8 oz (78.8 kg)   LMP 06/20/2016 (Approximate)   SpO2  98%   BMI 27.22 kg/m   Wt Readings from Last 3 Encounters:  01/18/23 173 lb 12.8 oz (78.8 kg)  12/28/22 177 lb 1.6 oz (80.3 kg)  10/09/22 175 lb 12.8 oz (79.7 kg)    Physical Exam Vitals and nursing note reviewed.  Constitutional:      General: She is not in acute distress.    Appearance: Normal appearance. She is not ill-appearing, toxic-appearing or diaphoretic.  HENT:     Head: Normocephalic and atraumatic.     Right Ear: External ear normal.     Left Ear: External ear normal.     Nose: Nose normal.     Mouth/Throat:     Mouth: Mucous membranes are moist.     Pharynx: Oropharynx is clear.  Eyes:     General: No scleral icterus.       Right eye: No discharge.        Left eye: No discharge.     Extraocular Movements: Extraocular movements intact.     Conjunctiva/sclera: Conjunctivae normal.     Pupils: Pupils are equal, round, and reactive to light.  Cardiovascular:     Rate and Rhythm: Normal rate and regular rhythm.     Pulses: Normal pulses.     Heart sounds: Normal heart sounds. No murmur heard.    No friction rub. No gallop.  Pulmonary:     Effort: Pulmonary effort is normal. No respiratory distress.     Breath sounds: Normal breath sounds. No stridor. No wheezing, rhonchi or rales.  Chest:     Chest wall: No tenderness.  Musculoskeletal:        General: Normal range of motion.     Cervical back: Normal range of motion and neck supple.  Skin:    General: Skin is warm and dry.     Capillary Refill: Capillary refill takes less than 2 seconds.     Coloration: Skin is not jaundiced or pale.     Findings: No bruising, erythema, lesion or rash.  Neurological:     General: No focal deficit present.     Mental Status: She is alert and oriented to person, place, and time.  Mental status is at baseline.  Psychiatric:        Mood and Affect: Mood normal.        Behavior: Behavior normal.        Thought Content: Thought content normal.        Judgment: Judgment normal.     Results for orders placed or performed during the hospital encounter of 06/22/22  Resp Panel by RT-PCR (Flu A&B, Covid) Anterior Nasal Swab   Specimen: Anterior Nasal Swab  Result Value Ref Range   SARS Coronavirus 2 by RT PCR NEGATIVE NEGATIVE   Influenza A by PCR NEGATIVE NEGATIVE   Influenza B by PCR NEGATIVE NEGATIVE      Assessment & Plan:   Problem List Items Addressed This Visit   None Visit Diagnoses     Preop exam for internal medicine    -  Primary   EKG normal. Checking labs today. Will discuss post-op N/V with her anesthesiologist. If labs normal, will clear for surgery.   Postoperative nausea and vomiting       Will make sure to let her anesthesiologist know prior to surgery.        Follow up plan: Return as scheduled.

## 2023-01-19 LAB — BASIC METABOLIC PANEL
BUN/Creatinine Ratio: 10 (ref 9–23)
BUN: 8 mg/dL (ref 6–24)
CO2: 24 mmol/L (ref 20–29)
Calcium: 9.5 mg/dL (ref 8.7–10.2)
Chloride: 101 mmol/L (ref 96–106)
Creatinine, Ser: 0.83 mg/dL (ref 0.57–1.00)
Glucose: 87 mg/dL (ref 70–99)
Potassium: 4.1 mmol/L (ref 3.5–5.2)
Sodium: 139 mmol/L (ref 134–144)
eGFR: 84 mL/min/{1.73_m2} (ref >59)

## 2023-01-19 LAB — CBC WITH DIFFERENTIAL/PLATELET
Basophils Absolute: 0 10*3/uL (ref 0.0–0.2)
Basos: 0 %
EOS (ABSOLUTE): 0 10*3/uL (ref 0.0–0.4)
Eos: 0 %
Hematocrit: 38.5 % (ref 34.0–46.6)
Hemoglobin: 12.6 g/dL (ref 11.1–15.9)
Immature Grans (Abs): 0 10*3/uL (ref 0.0–0.1)
Immature Granulocytes: 0 %
Lymphocytes Absolute: 4.1 10*3/uL — ABNORMAL HIGH (ref 0.7–3.1)
Lymphs: 56 %
MCH: 31.3 pg (ref 26.6–33.0)
MCHC: 32.7 g/dL (ref 31.5–35.7)
MCV: 96 fL (ref 79–97)
Monocytes Absolute: 0.5 10*3/uL (ref 0.1–0.9)
Monocytes: 6 %
Neutrophils Absolute: 2.9 10*3/uL (ref 1.4–7.0)
Neutrophils: 38 %
Platelets: 233 10*3/uL (ref 150–450)
RBC: 4.03 x10E6/uL (ref 3.77–5.28)
RDW: 12.5 % (ref 11.7–15.4)
WBC: 7.5 10*3/uL (ref 3.4–10.8)

## 2023-01-21 NOTE — Progress Notes (Signed)
Patient notified of results  by mychart  Labs normal. Cleared for surgery with low risk.

## 2023-01-22 ENCOUNTER — Other Ambulatory Visit (HOSPITAL_COMMUNITY): Payer: Self-pay

## 2023-01-22 ENCOUNTER — Other Ambulatory Visit: Payer: Self-pay

## 2023-01-22 ENCOUNTER — Encounter
Admission: RE | Admit: 2023-01-22 | Discharge: 2023-01-22 | Disposition: A | Discharge status: Home / Self Care | Payer: 59 | Source: Ambulatory Visit | Attending: Neurosurgery | Admitting: Neurosurgery

## 2023-01-22 VITALS — BP 115/75 | HR 82 | Resp 14 | Ht 67.0 in | Wt 174.0 lb

## 2023-01-22 DIAGNOSIS — Z01812 Encounter for preprocedural laboratory examination: Secondary | ICD-10-CM | POA: Diagnosis present

## 2023-01-22 DIAGNOSIS — Z01818 Encounter for other preprocedural examination: Secondary | ICD-10-CM

## 2023-01-22 LAB — URINALYSIS, ROUTINE W REFLEX MICROSCOPIC
Bacteria, UA: NONE SEEN
Bilirubin Urine: NEGATIVE
Glucose, UA: NEGATIVE mg/dL
Ketones, ur: NEGATIVE mg/dL
Leukocytes,Ua: NEGATIVE
Nitrite: NEGATIVE
Protein, ur: NEGATIVE mg/dL
Specific Gravity, Urine: 1.002 — ABNORMAL LOW (ref 1.005–1.030)
Squamous Epithelial / HPF: NONE SEEN /HPF (ref 0–5)
WBC, UA: NONE SEEN WBC/hpf (ref 0–5)
pH: 6 (ref 5.0–8.0)

## 2023-01-22 LAB — SURGICAL PCR SCREEN
MRSA, PCR: NEGATIVE
Staphylococcus aureus: NEGATIVE

## 2023-01-22 LAB — TYPE AND SCREEN
ABO/RH(D): A POS
Antibody Screen: NEGATIVE

## 2023-01-22 NOTE — Patient Instructions (Addendum)
Your procedure is scheduled on: Monday 02/04/23 To find out your arrival time, please call [phone] between 1PM - 3PM on:   Friday 02/01/23 Report to the Registration Desk on the 1st floor of the [facility]. Free Valet parking is available.  If your arrival time is 6:00 am, do not arrive before that time as the [facility] entrance doors do not open until 6:00 am.  REMEMBER: Instructions that are not followed completely may result in serious medical risk, up to and including death; or upon the discretion of your surgeon and anesthesiologist your surgery may need to be rescheduled.  Do not eat food after midnight the night before surgery.  No gum chewing or hard candies.  You may however, drink CLEAR liquids up to 2 hours before you are scheduled to arrive for your surgery. Do not drink anything within 2 hours of your scheduled arrival time.  Clear liquids include: - water  - apple juice without pulp - gatorade (not RED colors) - black coffee or tea (Do NOT add milk or creamers to the coffee or tea) Do NOT drink anything that is not on this list.  Type 1 and Type 2 diabetics should only drink water.  One week prior to surgery: Stop Anti-inflammatories (NSAIDS) such as Advil, Aleve, Ibuprofen, Motrin, Naproxen, Naprosyn and Aspirin based products such as Excedrin, Goody's Powder, BC Powder. Stop your Meloxicam You may however, continue to take Tylenol if needed for pain up until the day of surgery.  Stop ANY OVER THE COUNTER supplements until after surgery.  Continue taking all prescribed medications.   TAKE ONLY THESE MEDICATIONS THE MORNING OF SURGERY WITH A SIP OF WATER:  gabapentin (NEURONTIN)  morphine (MS CONTIN)   No Alcohol for 24 hours before or after surgery.  No Smoking including e-cigarettes for 24 hours before surgery.  No chewable tobacco products for at least 6 hours before surgery.  No nicotine patches on the day of surgery.  Do not use any  "recreational" drugs for at least a week (preferably 2 weeks) before your surgery.  Please be advised that the combination of cocaine and anesthesia may have negative outcomes, up to and including death. If you test positive for cocaine, your surgery will be cancelled.  On the morning of surgery brush your teeth with toothpaste and water, you may rinse your mouth with mouthwash if you wish. Do not swallow any toothpaste or mouthwash.  Use CHG Soap or wipes as directed on instruction sheet. Shower with CHG soap for 5 days beginning on Thursday 01/31/23  Do not wear lotions, powders, or perfumes on the day of surgery.  Do not shave body hair from the neck down 48 hours before surgery.  Wear comfortable clothing (specific to your surgery type) to the hospital.  Do not wear jewelry, make-up, hairpins, clips or nail polish.  Contact lenses, hearing aids and dentures may not be worn into surgery. Case for glasses  Do not bring valuables to the hospital. [facility] is not responsible for any missing/lost belongings or valuables.   Notify your doctor if there is any change in your medical condition (cold, fever, infection).  If you are being discharged the day of surgery, you will not be allowed to drive home. You will need a responsible individual to drive you home and stay with you for 24 hours after surgery.   If you are taking public transportation, you will need to have a responsible individual with you.  If you are  being admitted to the hospital overnight, leave your suitcase in the car. After surgery it may be brought to your room.  In case of increased patient census, it may be necessary for you, the patient, to continue your postoperative care in the Same Day Surgery department.  After surgery, you can help prevent lung complications by doing breathing exercises.  Take deep breaths and cough every 1-2 hours. Your doctor may order a device called an Incentive Spirometer to help you  take deep breaths. When coughing or sneezing, hold a pillow firmly against your incision with both hands. This is called "splinting." Doing this helps protect your incision. It also decreases belly discomfort.  Surgery Visitation Policy:  Patients undergoing a surgery or procedure may have two family members or support persons with them as long as the person is not COVID-19 positive or experiencing its symptoms.   Inpatient Visitation:    Visiting hours are 7 a.m. to 8 p.m. Up to four visitors are allowed at one time in a patient room. The visitors may rotate out with other people during the day. One designated support person (adult) may remain overnight.  Please call the Pre-admissions Testing Dept. at [phone] if you have any questions about these instructions.    Pre-operative 5 CHG Bath Instructions   You can play a key role in reducing the risk of infection after surgery. Your skin needs to be as free of germs as possible. You can reduce the number of germs on your skin by washing with CHG (chlorhexidine gluconate) soap before surgery. CHG is an antiseptic soap that kills germs and continues to kill germs even after washing.   DO NOT use if you have an allergy to chlorhexidine/CHG or antibacterial soaps. If your skin becomes reddened or irritated, stop using the CHG and notify one of our RNs at [phone].   Please shower with the CHG soap starting 4 days before surgery using the following schedule:     Please keep in mind the following:  DO NOT shave, including legs and underarms, starting the day of your first shower.   You may shave your face at any point before/day of surgery.  Place clean sheets on your bed the day you start using CHG soap. Use a clean washcloth (not used since being washed) for each shower. DO NOT sleep with pets once you start using the CHG.   CHG Shower Instructions:  If you choose to wash your hair and private area, wash first with your normal  shampoo/soap.  After you use shampoo/soap, rinse your hair and body thoroughly to remove shampoo/soap residue.  Turn the water OFF and apply about 3 tablespoons (45 ml) of CHG soap to a CLEAN washcloth.  Apply CHG soap ONLY FROM YOUR NECK DOWN TO YOUR TOES (washing for 3-5 minutes)  DO NOT use CHG soap on face, private areas, open wounds, or sores.  Pay special attention to the area where your surgery is being performed.  If you are having back surgery, having someone wash your back for you may be helpful. Wait 2 minutes after CHG soap is applied, then you may rinse off the CHG soap.  Pat dry with a clean towel  Put on clean clothes/pajamas   If you choose to wear lotion, please use ONLY the CHG-compatible lotions on the back of this paper.     Additional instructions for the day of surgery: DO NOT APPLY any lotions, deodorants, cologne, or perfumes.   Put on clean/comfortable  clothes.  Brush your teeth.  Ask your nurse before applying any prescription medications to the skin.      CHG Compatible Lotions   Aveeno Moisturizing lotion  Cetaphil Moisturizing Cream  Cetaphil Moisturizing Lotion  Clairol Herbal Essence Moisturizing Lotion, Dry Skin  Clairol Herbal Essence Moisturizing Lotion, Extra Dry Skin  Clairol Herbal Essence Moisturizing Lotion, Normal Skin  Curel Age Defying Therapeutic Moisturizing Lotion with Alpha Hydroxy  Curel Extreme Care Body Lotion  Curel Soothing Hands Moisturizing Hand Lotion  Curel Therapeutic Moisturizing Cream, Fragrance-Free  Curel Therapeutic Moisturizing Lotion, Fragrance-Free  Curel Therapeutic Moisturizing Lotion, Original Formula  Eucerin Daily Replenishing Lotion  Eucerin Dry Skin Therapy Plus Alpha Hydroxy Crme  Eucerin Dry Skin Therapy Plus Alpha Hydroxy Lotion  Eucerin Original Crme  Eucerin Original Lotion  Eucerin Plus Crme Eucerin Plus Lotion  Eucerin TriLipid Replenishing Lotion  Keri Anti-Bacterial Hand Lotion  Keri Deep  Conditioning Original Lotion Dry Skin Formula Softly Scented  Keri Deep Conditioning Original Lotion, Fragrance Free Sensitive Skin Formula  Keri Lotion Fast Absorbing Fragrance Free Sensitive Skin Formula  Keri Lotion Fast Absorbing Softly Scented Dry Skin Formula  Keri Original Lotion  Keri Skin Renewal Lotion Keri Silky Smooth Lotion  Keri Silky Smooth Sensitive Skin Lotion  Nivea Body Creamy Conditioning Oil  Nivea Body Extra Enriched Teacher, adult education Moisturizing Lotion Nivea Crme  Nivea Skin Firming Lotion  NutraDerm 30 Skin Lotion  NutraDerm Skin Lotion  NutraDerm Therapeutic Skin Cream  NutraDerm Therapeutic Skin Lotion  ProShield Protective Hand Cream  Provon moisturizing lotion

## 2023-01-22 NOTE — Progress Notes (Signed)
Interpreted by me 01/18/23. NSR at 81 bpm. No ST segment changes.

## 2023-01-25 ENCOUNTER — Other Ambulatory Visit (HOSPITAL_COMMUNITY): Payer: Self-pay

## 2023-01-28 LAB — NICOTINE/COTININE METABOLITES
Cotinine: 188.7 ng/mL
Nicotine: 24.2 ng/mL

## 2023-01-29 ENCOUNTER — Other Ambulatory Visit: Payer: Self-pay

## 2023-01-29 ENCOUNTER — Telehealth: Payer: Self-pay

## 2023-01-29 DIAGNOSIS — G8929 Other chronic pain: Secondary | ICD-10-CM

## 2023-01-29 DIAGNOSIS — Z01818 Encounter for other preprocedural examination: Secondary | ICD-10-CM

## 2023-01-29 DIAGNOSIS — S32009K Unspecified fracture of unspecified lumbar vertebra, subsequent encounter for fracture with nonunion: Secondary | ICD-10-CM

## 2023-01-29 NOTE — Progress Notes (Signed)
  [facility] Perioperative Services: Pre-Admission/Anesthesia Testing  Abnormal Lab Notification   Date: 01/29/23  Name: Janaysha Depaulo MRN:   [id number]  Re: Abnormal labs noted during PAT appointment   Notified:  Provider Name Provider Role Notification Mode  [name], MD Neurosurgery Routed and/or faxed via [facility]   Clinical Information and Notes:  ABNORMAL LAB VALUE(S):   Nicotine 01/22/2023 24.2  ng/mL Final   Comment: (NOTE) This test was developed and its performance characteristics determined by [facility]. It has not been cleared or approved by the Food and Drug Administration. Nicotine levels greater than 2.0 are consistent with the use of tobacco or tobacco cessation products.    Cotinine 01/22/2023 188.7  ng/mL Final   Comment: (NOTE) This test was developed and its performance characteristics determined by [facility]. It has not been cleared or approved by the Food and Drug Administration. Cotinine levels greater than 20.0 are consistent with the use of tobacco or tobacco cessation products. Performed At: [facility] [address] [place], [place] [id number] Jolene Schimke MD YQ:[id number]    Clinical Notes:   Hasini Peachey is scheduled for a OPEN L4-5 FUSION on 02/04/2023. Above lab results suggest that the patient is smoking and/or using nicotine containing cessation products. In the setting of her upcoming surgery, smoking significantly increases the risk of pseudoarthrosis. Smoking's effects on fusion rate are dependent on various factors, those of which include the type of arthrodesis procedure, spinal location and number of levels of the procedure, the type of graft used, etc. In addition to nonunion, smoking also increases the risk of other perioperative complications such as infection and can lead to adjacent-segment pathology,  Results communicated to primary attending neurosurgeon and his staff for review. MD may elect to  delay case based on these results.  Quentin Mulling, MSN, APRN, FNP-C, CEN [facility]  Peri-operative Services Nurse Practitioner Phone: [phone] Fax: [phone] 01/29/23 8:32 AM

## 2023-01-29 NOTE — Telephone Encounter (Signed)
Due to positive nicotine test, surgery has been rescheduled to 03/11/23. Dr Myer Haff would like her to have a repeat blood nicotine test about 2 weeks prior to surgery. I have discussed this with the patient.

## 2023-01-31 ENCOUNTER — Other Ambulatory Visit: Payer: Self-pay

## 2023-01-31 ENCOUNTER — Telehealth (HOSPITAL_BASED_OUTPATIENT_CLINIC_OR_DEPARTMENT_OTHER): Payer: 59 | Admitting: Psychiatry

## 2023-01-31 ENCOUNTER — Encounter (HOSPITAL_COMMUNITY): Payer: Self-pay | Admitting: Psychiatry

## 2023-01-31 VITALS — Wt 174.0 lb

## 2023-01-31 DIAGNOSIS — F410 Panic disorder [episodic paroxysmal anxiety] without agoraphobia: Secondary | ICD-10-CM

## 2023-01-31 DIAGNOSIS — F411 Generalized anxiety disorder: Secondary | ICD-10-CM | POA: Diagnosis not present

## 2023-01-31 DIAGNOSIS — F3342 Major depressive disorder, recurrent, in full remission: Secondary | ICD-10-CM | POA: Diagnosis not present

## 2023-01-31 MED ORDER — LAMOTRIGINE 200 MG PO TABS: 200.0000 mg | ORAL_TABLET | Freq: Every day | ORAL | 0 refills | Status: DC

## 2023-01-31 MED ORDER — PAROXETINE HCL 40 MG PO TABS
40.0000 mg | ORAL_TABLET | Freq: Every day | ORAL | 0 refills | Status: DC
Start: 2023-01-31 — End: 2023-02-27

## 2023-01-31 MED ORDER — ARIPIPRAZOLE 5 MG PO TABS
5.0000 mg | ORAL_TABLET | Freq: Every day | ORAL | 0 refills | Status: DC
Start: 2023-01-31 — End: 2023-02-27

## 2023-01-31 MED ORDER — LORAZEPAM 0.5 MG PO TABS
0.5000 mg | ORAL_TABLET | ORAL | 0 refills | Status: DC | PRN
Start: 2023-01-31 — End: 2023-03-12
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
## 2023-02-04 ENCOUNTER — Telehealth (HOSPITAL_COMMUNITY): Payer: 59 | Admitting: Psychiatry

## 2023-02-05 ENCOUNTER — Ambulatory Visit: Payer: Commercial Managed Care - PPO | Admitting: Dermatology

## 2023-02-08 ENCOUNTER — Other Ambulatory Visit: Payer: Self-pay | Admitting: Neurosurgery

## 2023-02-08 ENCOUNTER — Other Ambulatory Visit: Payer: Self-pay

## 2023-02-08 MED ORDER — MORPHINE SULFATE ER 30 MG PO TBCR
30.0000 mg | EXTENDED_RELEASE_TABLET | Freq: Every morning | ORAL | 0 refills | Status: AC
  Filled 2023-02-08: qty 5, 5d supply, fill #0

## 2023-02-08 MED ORDER — OXYCODONE HCL 15 MG PO TABS
15.0000 mg | ORAL_TABLET | Freq: Four times a day (QID) | ORAL | 0 refills | Status: DC | PRN
  Filled 2023-02-08 – 2023-02-14 (×2): qty 20, 5d supply, fill #0

## 2023-02-11 ENCOUNTER — Other Ambulatory Visit: Payer: Self-pay

## 2023-02-14 ENCOUNTER — Other Ambulatory Visit: Payer: Self-pay

## 2023-02-15 ENCOUNTER — Other Ambulatory Visit: Payer: Self-pay

## 2023-02-15 NOTE — Telephone Encounter (Signed)
Notified OR.

## 2023-02-15 NOTE — Telephone Encounter (Signed)
-----   Message from [name], MD sent at 02/07/2023  8:10 AM EDT ----- Regarding: RE: appeal No more appeals - we will do the case as planned without BMP  ----- Message ----- From: Sharlot Gowda, RN Sent: 02/06/2023  12:35 PM EDT To: [name], MD Subject: appeal                                         After appeal, denial for [id number] was upheld that it is considered experimental and investigational. What do you want to do? Do you want to proceed with a 2nd level appeal? We would have to send clinicals and a letter again requesting a 2nd level appeal.

## 2023-02-18 ENCOUNTER — Other Ambulatory Visit: Payer: Self-pay

## 2023-02-18 ENCOUNTER — Other Ambulatory Visit (HOSPITAL_COMMUNITY): Payer: Self-pay | Admitting: Psychiatry

## 2023-02-18 DIAGNOSIS — F410 Panic disorder [episodic paroxysmal anxiety] without agoraphobia: Secondary | ICD-10-CM

## 2023-02-18 DIAGNOSIS — F411 Generalized anxiety disorder: Secondary | ICD-10-CM

## 2023-02-18 MED ORDER — MORPHINE SULFATE ER 30 MG PO TBCR
30.0000 mg | EXTENDED_RELEASE_TABLET | Freq: Every day | ORAL | 0 refills | Status: DC
Start: 2023-02-18 — End: 2023-02-20
  Filled 2023-02-18: qty 5, 5d supply, fill #0

## 2023-02-18 MED ORDER — OXYCODONE HCL 15 MG PO TABS
15.0000 mg | ORAL_TABLET | Freq: Four times a day (QID) | ORAL | 0 refills | Status: DC | PRN
Start: 2023-02-18 — End: 2023-02-20
  Filled 2023-02-18: qty 20, 5d supply, fill #0

## 2023-02-18 NOTE — Telephone Encounter (Signed)
Patient called to make sure her message had been received. She is out of morphine and has 2 pills of the oxycodone.

## 2023-02-18 NOTE — Telephone Encounter (Signed)
She is scheduled for lumbar fusion L4-L5 on 03/11/23.   She was given oxycodone 15mg  #20 on 02/14/23 and MS Contin ER 30 mg on 02/11/23.   PMP reviewed and is appropriate.   Dr. Myer Haff has discussed these medications with her previously.   Okay to refill both medications. Sent to her pharmacy.

## 2023-02-19 ENCOUNTER — Encounter: Payer: 59 | Admitting: Neurosurgery

## 2023-02-19 ENCOUNTER — Other Ambulatory Visit (HOSPITAL_COMMUNITY): Payer: Self-pay | Admitting: Psychiatry

## 2023-02-19 ENCOUNTER — Other Ambulatory Visit: Payer: Self-pay

## 2023-02-19 DIAGNOSIS — F410 Panic disorder [episodic paroxysmal anxiety] without agoraphobia: Secondary | ICD-10-CM

## 2023-02-19 DIAGNOSIS — F411 Generalized anxiety disorder: Secondary | ICD-10-CM

## 2023-02-20 ENCOUNTER — Encounter: Payer: Self-pay | Admitting: Neurosurgery

## 2023-02-20 DIAGNOSIS — G8929 Other chronic pain: Secondary | ICD-10-CM

## 2023-02-20 DIAGNOSIS — S32009K Unspecified fracture of unspecified lumbar vertebra, subsequent encounter for fracture with nonunion: Secondary | ICD-10-CM

## 2023-02-20 MED ORDER — OXYCODONE HCL 15 MG PO TABS
15.0000 mg | ORAL_TABLET | Freq: Four times a day (QID) | ORAL | 0 refills | Status: DC | PRN
Start: 2023-02-23 — End: 2023-02-27

## 2023-02-20 MED ORDER — MORPHINE SULFATE ER 30 MG PO TBCR
30.0000 mg | EXTENDED_RELEASE_TABLET | Freq: Every day | ORAL | 0 refills | Status: DC
Start: 2023-02-23 — End: 2023-02-27

## 2023-02-20 NOTE — Addendum Note (Signed)
Addended byDrake Leach on: 02/20/2023 02:00 PM   Modules accepted: Orders

## 2023-02-20 NOTE — Telephone Encounter (Signed)
She is scheduled for lumbar fusion L4-L5 on 03/11/23. Nicotine test scheduled for 03/01/23.    She was given oxycodone 15mg  #20 on 02/18/23 and MS Contin ER 30 mg on 02/18/23.   She would be due for refills on 02/23/23. [facility] pharmacy is not open on weekends.    PMP reviewed and is appropriate.    Dr. Myer Haff has discussed these medications with her previously.    Refill of MS Contin and oxycodone sent to [place] to fill on 02/23/23.

## 2023-02-21 ENCOUNTER — Other Ambulatory Visit: Payer: Self-pay

## 2023-02-25 ENCOUNTER — Other Ambulatory Visit: Payer: Self-pay

## 2023-02-26 ENCOUNTER — Other Ambulatory Visit: Payer: 59

## 2023-02-26 ENCOUNTER — Other Ambulatory Visit (HOSPITAL_COMMUNITY): Payer: Self-pay

## 2023-02-26 ENCOUNTER — Encounter: Payer: Self-pay | Admitting: Neurosurgery

## 2023-02-27 ENCOUNTER — Telehealth (HOSPITAL_BASED_OUTPATIENT_CLINIC_OR_DEPARTMENT_OTHER): Payer: 59 | Admitting: Psychiatry

## 2023-02-27 ENCOUNTER — Other Ambulatory Visit: Payer: Self-pay

## 2023-02-27 ENCOUNTER — Encounter (HOSPITAL_COMMUNITY): Payer: Self-pay

## 2023-02-27 ENCOUNTER — Encounter (HOSPITAL_COMMUNITY): Payer: Self-pay | Admitting: Psychiatry

## 2023-02-27 ENCOUNTER — Other Ambulatory Visit: Payer: Self-pay | Admitting: Neurosurgery

## 2023-02-27 VITALS — Wt 174.0 lb

## 2023-02-27 DIAGNOSIS — F410 Panic disorder [episodic paroxysmal anxiety] without agoraphobia: Secondary | ICD-10-CM

## 2023-02-27 DIAGNOSIS — S32009K Unspecified fracture of unspecified lumbar vertebra, subsequent encounter for fracture with nonunion: Secondary | ICD-10-CM

## 2023-02-27 DIAGNOSIS — F3342 Major depressive disorder, recurrent, in full remission: Secondary | ICD-10-CM

## 2023-02-27 DIAGNOSIS — G8929 Other chronic pain: Secondary | ICD-10-CM

## 2023-02-27 DIAGNOSIS — F411 Generalized anxiety disorder: Secondary | ICD-10-CM

## 2023-02-27 MED ORDER — OXYCODONE HCL 15 MG PO TABS
15.0000 mg | ORAL_TABLET | Freq: Four times a day (QID) | ORAL | 0 refills | Status: DC | PRN
Start: 2023-02-27 — End: 2023-03-04

## 2023-02-27 MED ORDER — LAMOTRIGINE 200 MG PO TABS
200.0000 mg | ORAL_TABLET | Freq: Every day | ORAL | 0 refills | Status: DC
Start: 2023-02-27 — End: 2023-05-06
  Filled 2023-02-27: qty 90, 90d supply, fill #0

## 2023-02-27 MED ORDER — PAROXETINE HCL 40 MG PO TABS
40.0000 mg | ORAL_TABLET | Freq: Every day | ORAL | 0 refills | Status: DC
Start: 2023-02-27 — End: 2023-05-06
  Filled 2023-02-27: qty 90, 90d supply, fill #0

## 2023-02-27 MED ORDER — ARIPIPRAZOLE 5 MG PO TABS
5.0000 mg | ORAL_TABLET | Freq: Every day | ORAL | 0 refills | Status: DC
Start: 2023-02-27 — End: 2023-05-06
  Filled 2023-02-27: qty 90, 90d supply, fill #0

## 2023-02-27 MED ORDER — MORPHINE SULFATE ER 30 MG PO TBCR
30.0000 mg | EXTENDED_RELEASE_TABLET | Freq: Every day | ORAL | 0 refills | Status: DC
Start: 2023-02-27 — End: 2023-03-04
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
## 2023-02-27 NOTE — Progress Notes (Signed)
PDMP reviewed and appropriate. Refills for morphine and oxycodone sent

## 2023-02-28 ENCOUNTER — Other Ambulatory Visit: Payer: Self-pay

## 2023-03-01 ENCOUNTER — Encounter: Payer: 59 | Admitting: Family Medicine

## 2023-03-01 ENCOUNTER — Inpatient Hospital Stay: Admission: RE | Admit: 2023-03-01 | Payer: 59 | Source: Ambulatory Visit

## 2023-03-02 ENCOUNTER — Other Ambulatory Visit
Admission: RE | Admit: 2023-03-02 | Discharge: 2023-03-02 | Disposition: A | Discharge status: Home / Self Care | Payer: 59 | Source: Ambulatory Visit | Attending: Neurosurgery | Admitting: Neurosurgery

## 2023-03-02 DIAGNOSIS — Z01818 Encounter for other preprocedural examination: Secondary | ICD-10-CM | POA: Insufficient documentation

## 2023-03-04 ENCOUNTER — Other Ambulatory Visit: Payer: Self-pay

## 2023-03-04 MED ORDER — OXYCODONE HCL 15 MG PO TABS
15.0000 mg | ORAL_TABLET | Freq: Four times a day (QID) | ORAL | 0 refills | Status: DC | PRN
Start: 2023-03-04 — End: 2023-03-12
  Filled 2023-03-04: qty 20, 5d supply, fill #0

## 2023-03-04 MED ORDER — MORPHINE SULFATE ER 30 MG PO TBCR
30.0000 mg | EXTENDED_RELEASE_TABLET | Freq: Every day | ORAL | 0 refills | Status: DC
Start: 2023-03-04 — End: 2023-03-12
  Filled 2023-03-04: qty 5, 5d supply, fill #0

## 2023-03-04 NOTE — Telephone Encounter (Signed)
She is scheduled for lumbar fusion L4-L5 on 03/11/23. Nicotine test done on 03/02/23.    She was given oxycodone 15mg  #20 on 02/27/23 and MS Contin ER 30 mg #5 on 02/27/23.    PMP reviewed and is appropriate. Oxycodone and MS Contin refilled.    Dr. Myer Haff has discussed these medications with her previously.

## 2023-03-06 LAB — NICOTINE/COTININE METABOLITES
Cotinine: <1 ng/mL
Nicotine: <1 ng/mL

## 2023-03-08 ENCOUNTER — Encounter
Admission: RE | Admit: 2023-03-08 | Discharge: 2023-03-08 | Disposition: A | Discharge status: Home / Self Care | Payer: 59 | Source: Ambulatory Visit | Attending: Neurosurgery | Admitting: Neurosurgery

## 2023-03-08 ENCOUNTER — Inpatient Hospital Stay: Admission: RE | Admit: 2023-03-08 | Payer: 59 | Source: Ambulatory Visit

## 2023-03-08 ENCOUNTER — Other Ambulatory Visit: Payer: Self-pay

## 2023-03-08 HISTORY — DX: Pain, unspecified: R52

## 2023-03-08 HISTORY — DX: Other specified personal risk factors, not elsewhere classified: Z91.89

## 2023-03-08 HISTORY — DX: Unilateral primary osteoarthritis, right knee: M17.11

## 2023-03-08 HISTORY — DX: Allergic rhinitis, unspecified: J30.9

## 2023-03-08 HISTORY — DX: Spondylolisthesis, site unspecified: M43.10

## 2023-03-08 HISTORY — DX: Generalized anxiety disorder: F41.1

## 2023-03-08 HISTORY — DX: Other intervertebral disc displacement, lumbar region: M51.26

## 2023-03-08 HISTORY — DX: Panic disorder (episodic paroxysmal anxiety): F41.0

## 2023-03-08 HISTORY — DX: 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, unspecified: L40.50

## 2023-03-08 HISTORY — DX: Unspecified fracture of unspecified lumbar vertebra, subsequent encounter for fracture with nonunion: S32.009K

## 2023-03-08 HISTORY — DX: Other intervertebral disc degeneration, lumbar region without mention of lumbar back pain or lower extremity pain: M51.369

## 2023-03-08 HISTORY — DX: Spinal stenosis, lumbar region without neurogenic claudication: M48.061

## 2023-03-08 HISTORY — DX: Major depressive disorder, recurrent severe without psychotic features: F33.2

## 2023-03-08 NOTE — Patient Instructions (Addendum)
Your procedure is scheduled on: Monday March 11, 2023.  Report to the Registration Desk on the 1st floor of the [facility] Mall. To find out your arrival time, please call [phone] between 1PM - 3PM on: Friday March 08, 2023. If your arrival time is 6:00 am, do not arrive before that time as the [facility] entrance doors do not open until 6:00 am.  REMEMBER: Instructions that are not followed completely may result in serious medical risk, up to and including death; or upon the discretion of your surgeon and anesthesiologist your surgery may need to be rescheduled.  Do not eat food after midnight the night before surgery.  No gum chewing or hard candies.  You may however, drink CLEAR liquids up to 2 hours before you are scheduled to arrive for your surgery. Do not drink anything within 2 hours of your scheduled arrival time.  Clear liquids include: - water  - apple juice without pulp - gatorade (not RED colors) - black coffee or tea (Do NOT add milk or creamers to the coffee or tea) Do NOT drink anything that is not on this list.   One week prior to surgery: Stop Anti-inflammatories (NSAIDS) such as Advil, Aleve, Ibuprofen, Motrin, Naproxen, Naprosyn and Aspirin based products such as Excedrin, Goody's Powder, BC Powder. Stop ANY OVER THE COUNTER supplements until after surgery. You may however, continue to take Tylenol if needed for pain up until the day of surgery.  Continue taking all prescribed medications with the exception of the following:  **Follow guidelines for insulin and diabetes medications**  Follow recommendations from Cardiologist or PCP regarding stopping blood thinners.  TAKE ONLY THESE MEDICATIONS THE MORNING OF SURGERY WITH A SIP OF WATER:  gabapentin (NEURONTIN) 600 MG  morphine (MS CONTIN) 30 MG    No Alcohol for 24 hours before or after surgery.  No Smoking including e-cigarettes for 24 hours before surgery.  No chewable tobacco products for at least  6 hours before surgery.  No nicotine patches on the day of surgery.  Do not use any "recreational" drugs for at least a week (preferably 2 weeks) before your surgery.  Please be advised that the combination of cocaine and anesthesia may have negative outcomes, up to and including death. If you test positive for cocaine, your surgery will be cancelled.  On the morning of surgery brush your teeth with toothpaste and water, you may rinse your mouth with mouthwash if you wish. Do not swallow any toothpaste or mouthwash.  Use CHG Soap or wipes as directed on instruction sheet.  Do not wear jewelry, make-up, hairpins, clips or nail polish.  Do not wear lotions, powders, or perfumes.   Do not shave body hair from the neck down 48 hours before surgery.  Contact lenses, hearing aids and dentures may not be worn into surgery.  Do not bring valuables to the hospital. [facility] is not responsible for any missing/lost belongings or valuables.   Notify your doctor if there is any change in your medical condition (cold, fever, infection).  Wear comfortable clothing (specific to your surgery type) to the hospital.  After surgery, you can help prevent lung complications by doing breathing exercises.  Take deep breaths and cough every 1-2 hours. Your doctor may order a device called an Incentive Spirometer to help you take deep breaths. When coughing or sneezing, hold a pillow firmly against your incision with both hands. This is called "splinting." Doing this helps protect your incision. It also decreases  belly discomfort.  If you are being admitted to the hospital overnight, leave your suitcase in the car. After surgery it may be brought to your room.  In case of increased patient census, it may be necessary for you, the patient, to continue your postoperative care in the Same Day Surgery department.  If you are being discharged the day of surgery, you will not be allowed to drive home. You will  need a responsible individual to drive you home and stay with you for 24 hours after surgery.   If you are taking public transportation, you will need to have a responsible individual with you.  Please call the Pre-admissions Testing Dept. at [phone] if you have any questions about these instructions.  Surgery Visitation Policy:  Patients having surgery or a procedure may have two visitors.  Children under the age of 27 must have an adult with them who is not the patient.  Inpatient Visitation:    Visiting hours are 7 a.m. to 8 p.m. Up to four visitors are allowed at one time in a patient room. The visitors may rotate out with other people during the day.  One visitor age 47 or older may stay with the patient overnight and must be in the room by 8 p.m.  Pre-operative 5 CHG Bath Instructions   You can play a key role in reducing the risk of infection after surgery. Your skin needs to be as free of germs as possible. You can reduce the number of germs on your skin by washing with CHG (chlorhexidine gluconate) soap before surgery. CHG is an antiseptic soap that kills germs and continues to kill germs even after washing.   DO NOT use if you have an allergy to chlorhexidine/CHG or antibacterial soaps. If your skin becomes reddened or irritated, stop using the CHG and notify one of our RNs at [phone].   Please shower with the CHG soap starting 4 days before surgery using the following schedule:     Please keep in mind the following:  DO NOT shave, including legs and underarms, starting the day of your first shower.   You may shave your face at any point before/day of surgery.  Place clean sheets on your bed the day you start using CHG soap. Use a clean washcloth (not used since being washed) for each shower. DO NOT sleep with pets once you start using the CHG.   CHG Shower Instructions:  If you choose to wash your hair and private area, wash first with your normal  shampoo/soap.  After you use shampoo/soap, rinse your hair and body thoroughly to remove shampoo/soap residue.  Turn the water OFF and apply about 3 tablespoons (45 ml) of CHG soap to a CLEAN washcloth.  Apply CHG soap ONLY FROM YOUR NECK DOWN TO YOUR TOES (washing for 3-5 minutes)  DO NOT use CHG soap on face, private areas, open wounds, or sores.  Pay special attention to the area where your surgery is being performed.  If you are having back surgery, having someone wash your back for you may be helpful. Wait 2 minutes after CHG soap is applied, then you may rinse off the CHG soap.  Pat dry with a clean towel  Put on clean clothes/pajamas   If you choose to wear lotion, please use ONLY the CHG-compatible lotions on the back of this paper.     Additional instructions for the day of surgery: DO NOT APPLY any lotions, deodorants, cologne, or perfumes.  Put on clean/comfortable clothes.  Brush your teeth.  Ask your nurse before applying any prescription medications to the skin.      CHG Compatible Lotions   Aveeno Moisturizing lotion  Cetaphil Moisturizing Cream  Cetaphil Moisturizing Lotion  Clairol Herbal Essence Moisturizing Lotion, Dry Skin  Clairol Herbal Essence Moisturizing Lotion, Extra Dry Skin  Clairol Herbal Essence Moisturizing Lotion, Normal Skin  Curel Age Defying Therapeutic Moisturizing Lotion with Alpha Hydroxy  Curel Extreme Care Body Lotion  Curel Soothing Hands Moisturizing Hand Lotion  Curel Therapeutic Moisturizing Cream, Fragrance-Free  Curel Therapeutic Moisturizing Lotion, Fragrance-Free  Curel Therapeutic Moisturizing Lotion, Original Formula  Eucerin Daily Replenishing Lotion  Eucerin Dry Skin Therapy Plus Alpha Hydroxy Crme  Eucerin Dry Skin Therapy Plus Alpha Hydroxy Lotion  Eucerin Original Crme  Eucerin Original Lotion  Eucerin Plus Crme Eucerin Plus Lotion  Eucerin TriLipid Replenishing Lotion  Keri Anti-Bacterial Hand Lotion  Keri Deep  Conditioning Original Lotion Dry Skin Formula Softly Scented  Keri Deep Conditioning Original Lotion, Fragrance Free Sensitive Skin Formula  Keri Lotion Fast Absorbing Fragrance Free Sensitive Skin Formula  Keri Lotion Fast Absorbing Softly Scented Dry Skin Formula  Keri Original Lotion  Keri Skin Renewal Lotion Keri Silky Smooth Lotion  Keri Silky Smooth Sensitive Skin Lotion  Nivea Body Creamy Conditioning Oil  Nivea Body Extra Enriched Teacher, adult education Moisturizing Lotion Nivea Crme  Nivea Skin Firming Lotion  NutraDerm 30 Skin Lotion  NutraDerm Skin Lotion  NutraDerm Therapeutic Skin Cream  NutraDerm Therapeutic Skin Lotion  ProShield Protective Hand Cream  Provon moisturizing lotion

## 2023-03-11 ENCOUNTER — Inpatient Hospital Stay
Admission: RE | Admit: 2023-03-11 | Discharge: 2023-03-12 | DRG: 460 | Disposition: A | Discharge status: Home / Self Care | Payer: 59 | Source: Ambulatory Visit | Attending: Neurosurgery | Admitting: Neurosurgery

## 2023-03-11 ENCOUNTER — Inpatient Hospital Stay: Payer: 59 | Admitting: Urgent Care

## 2023-03-11 ENCOUNTER — Other Ambulatory Visit: Payer: Self-pay

## 2023-03-11 ENCOUNTER — Encounter: Payer: Self-pay | Admitting: Neurosurgery

## 2023-03-11 ENCOUNTER — Inpatient Hospital Stay: Payer: 59

## 2023-03-11 ENCOUNTER — Encounter
Admission: RE | Disposition: A | Discharge status: Home / Self Care | Payer: Self-pay | Source: Ambulatory Visit | Attending: Neurosurgery

## 2023-03-11 DIAGNOSIS — F32A Depression, unspecified: Secondary | ICD-10-CM | POA: Diagnosis present

## 2023-03-11 DIAGNOSIS — M960 Pseudarthrosis after fusion or arthrodesis: Principal | ICD-10-CM

## 2023-03-11 DIAGNOSIS — Z803 Family history of malignant neoplasm of breast: Secondary | ICD-10-CM

## 2023-03-11 DIAGNOSIS — Z91013 Allergy to seafood: Secondary | ICD-10-CM

## 2023-03-11 DIAGNOSIS — L405 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, unspecified: Secondary | ICD-10-CM | POA: Diagnosis present

## 2023-03-11 DIAGNOSIS — Z833 Family history of diabetes mellitus: Secondary | ICD-10-CM

## 2023-03-11 DIAGNOSIS — Z8261 Family history of arthritis: Secondary | ICD-10-CM

## 2023-03-11 DIAGNOSIS — Z880 Allergy status to penicillin: Secondary | ICD-10-CM

## 2023-03-11 DIAGNOSIS — Z79891 Long term (current) use of opiate analgesic: Secondary | ICD-10-CM | POA: Diagnosis not present

## 2023-03-11 DIAGNOSIS — F419 Anxiety disorder, unspecified: Secondary | ICD-10-CM | POA: Diagnosis not present

## 2023-03-11 DIAGNOSIS — Z981 Arthrodesis status: Principal | ICD-10-CM

## 2023-03-11 DIAGNOSIS — M5442 Lumbago with sciatica, left side: Secondary | ICD-10-CM | POA: Diagnosis present

## 2023-03-11 DIAGNOSIS — S32009K Unspecified fracture of unspecified lumbar vertebra, subsequent encounter for fracture with nonunion: Secondary | ICD-10-CM

## 2023-03-11 DIAGNOSIS — G9741 Accidental puncture or laceration of dura during a procedure: Secondary | ICD-10-CM | POA: Diagnosis not present

## 2023-03-11 DIAGNOSIS — Y838 Other surgical procedures as the cause of abnormal reaction of the patient, or of later complication, without mention of misadventure at the time of the procedure: Secondary | ICD-10-CM | POA: Diagnosis not present

## 2023-03-11 DIAGNOSIS — Z87891 Personal history of nicotine dependence: Secondary | ICD-10-CM | POA: Diagnosis not present

## 2023-03-11 DIAGNOSIS — G8929 Other chronic pain: Secondary | ICD-10-CM | POA: Diagnosis not present

## 2023-03-11 DIAGNOSIS — M797 Fibromyalgia: Secondary | ICD-10-CM | POA: Diagnosis present

## 2023-03-11 DIAGNOSIS — Z83438 Family history of other disorder of lipoprotein metabolism and other lipidemia: Secondary | ICD-10-CM

## 2023-03-11 DIAGNOSIS — Z79899 Other long term (current) drug therapy: Secondary | ICD-10-CM | POA: Diagnosis not present

## 2023-03-11 DIAGNOSIS — Z791 Long term (current) use of non-steroidal anti-inflammatories (NSAID): Secondary | ICD-10-CM | POA: Diagnosis not present

## 2023-03-11 DIAGNOSIS — Z888 Allergy status to other drugs, medicaments and biological substances status: Secondary | ICD-10-CM | POA: Diagnosis not present

## 2023-03-11 DIAGNOSIS — Z472 Encounter for removal of internal fixation device: Secondary | ICD-10-CM | POA: Diagnosis not present

## 2023-03-11 DIAGNOSIS — Z8249 Family history of ischemic heart disease and other diseases of the circulatory system: Secondary | ICD-10-CM | POA: Diagnosis not present

## 2023-03-11 DIAGNOSIS — Z01818 Encounter for other preprocedural examination: Secondary | ICD-10-CM

## 2023-03-11 HISTORY — PX: APPLICATION OF INTRAOPERATIVE CT SCAN: SHX6668

## 2023-03-11 LAB — TYPE AND SCREEN
ABO/RH(D): A POS
Antibody Screen: NEGATIVE

## 2023-03-11 SURGERY — POSTERIOR LUMBAR FUSION 1 WITH HARDWARE REMOVAL
Anesthesia: General | Site: Back

## 2023-03-11 MED ORDER — SENNA 8.6 MG PO TABS
1.0000 | ORAL_TABLET | Freq: Two times a day (BID) | ORAL | Status: DC
  Administered 2023-03-11 – 2023-03-12 (×3): 8.6 mg via ORAL

## 2023-03-11 MED ORDER — OXYCODONE HCL 5 MG PO TABS
ORAL_TABLET | ORAL | Status: AC
  Filled 2023-03-11: qty 2

## 2023-03-11 MED ORDER — DEXAMETHASONE SODIUM PHOSPHATE 10 MG/ML IJ SOLN
INTRAMUSCULAR | Status: AC
  Filled 2023-03-11: qty 1

## 2023-03-11 MED ORDER — DIPHENHYDRAMINE HCL 50 MG/ML IJ SOLN
INTRAMUSCULAR | Status: DC | PRN
  Administered 2023-03-11: 12.5 mg via INTRAVENOUS

## 2023-03-11 MED ORDER — ONDANSETRON HCL 4 MG PO TABS: 4.0000 mg | ORAL_TABLET | Freq: Four times a day (QID) | ORAL | Status: DC | PRN

## 2023-03-11 MED ORDER — BUPIVACAINE-EPINEPHRINE (PF) 0.5% -1:200000 IJ SOLN
INTRAMUSCULAR | Status: AC
  Filled 2023-03-11: qty 20

## 2023-03-11 MED ORDER — LIDOCAINE HCL (PF) 2 % IJ SOLN
INTRAMUSCULAR | Status: AC
  Filled 2023-03-11: qty 10

## 2023-03-11 MED ORDER — LIDOCAINE HCL (CARDIAC) PF 100 MG/5ML IV SOSY
PREFILLED_SYRINGE | INTRAVENOUS | Status: DC | PRN
  Administered 2023-03-11: 100 mg via INTRAVENOUS

## 2023-03-11 MED ORDER — BUPIVACAINE LIPOSOME 1.3 % IJ SUSP
INTRAMUSCULAR | Status: AC
  Filled 2023-03-11: qty 20

## 2023-03-11 MED ORDER — ACETAMINOPHEN 325 MG PO TABS: 650.0000 mg | ORAL_TABLET | ORAL | Status: DC | PRN

## 2023-03-11 MED ORDER — MIDAZOLAM HCL 2 MG/2ML IJ SOLN
INTRAMUSCULAR | Status: AC
  Filled 2023-03-11: qty 2

## 2023-03-11 MED ORDER — FENTANYL CITRATE (PF) 100 MCG/2ML IJ SOLN
INTRAMUSCULAR | Status: AC
  Filled 2023-03-11: qty 2

## 2023-03-11 MED ORDER — SODIUM CHLORIDE 0.9% FLUSH
3.0000 mL | Freq: Two times a day (BID) | INTRAVENOUS | Status: DC
  Administered 2023-03-11 – 2023-03-12 (×3): 3 mL via INTRAVENOUS

## 2023-03-11 MED ORDER — ACETAMINOPHEN 650 MG RE SUPP: 650.0000 mg | RECTAL | Status: DC | PRN

## 2023-03-11 MED ORDER — MORPHINE SULFATE ER 30 MG PO TBCR
30.0000 mg | EXTENDED_RELEASE_TABLET | Freq: Every day | ORAL | Status: DC
  Administered 2023-03-11 – 2023-03-12 (×2): 30 mg via ORAL
  Filled 2023-03-11 (×3): qty 1

## 2023-03-11 MED ORDER — SUGAMMADEX SODIUM 200 MG/2ML IV SOLN
INTRAVENOUS | Status: DC | PRN
  Administered 2023-03-11: 200 mg via INTRAVENOUS

## 2023-03-11 MED ORDER — LACTATED RINGERS IV SOLN: INTRAVENOUS | Status: DC

## 2023-03-11 MED ORDER — FENTANYL CITRATE (PF) 100 MCG/2ML IJ SOLN
INTRAMUSCULAR | Status: DC | PRN
  Administered 2023-03-11: 100 ug via INTRAVENOUS
  Administered 2023-03-11: 50 ug via INTRAVENOUS

## 2023-03-11 MED ORDER — VANCOMYCIN HCL IN DEXTROSE 1-5 GM/200ML-% IV SOLN
1000.0000 mg | Freq: Once | INTRAVENOUS | Status: AC
  Administered 2023-03-11: 1000 mg via INTRAVENOUS

## 2023-03-11 MED ORDER — SURGIPHOR WOUND IRRIGATION SYSTEM - OPTIME: TOPICAL | Status: DC | PRN

## 2023-03-11 MED ORDER — CHLORHEXIDINE GLUCONATE 0.12 % MT SOLN
OROMUCOSAL | Status: AC
  Filled 2023-03-11: qty 15

## 2023-03-11 MED ORDER — BUPIVACAINE-EPINEPHRINE (PF) 0.5% -1:200000 IJ SOLN
INTRAMUSCULAR | Status: DC | PRN
  Administered 2023-03-11: 10 mL

## 2023-03-11 MED ORDER — ORAL CARE MOUTH RINSE: 15.0000 mL | Freq: Once | OROMUCOSAL | Status: AC

## 2023-03-11 MED ORDER — OXYCODONE HCL 5 MG PO TABS
ORAL_TABLET | ORAL | Status: AC
  Filled 2023-03-11: qty 1

## 2023-03-11 MED ORDER — VANCOMYCIN HCL IN DEXTROSE 1-5 GM/200ML-% IV SOLN
INTRAVENOUS | Status: AC
  Filled 2023-03-11: qty 200

## 2023-03-11 MED ORDER — BISACODYL 10 MG RE SUPP: 10.0000 mg | Freq: Every day | RECTAL | Status: DC | PRN

## 2023-03-11 MED ORDER — OXYCODONE HCL 5 MG PO TABS
5.0000 mg | ORAL_TABLET | Freq: Once | ORAL | Status: AC | PRN
  Administered 2023-03-11: 5 mg via ORAL

## 2023-03-11 MED ORDER — OXYCODONE HCL 5 MG PO TABS
10.0000 mg | ORAL_TABLET | ORAL | Status: DC | PRN
  Administered 2023-03-11 – 2023-03-12 (×5): 10 mg via ORAL

## 2023-03-11 MED ORDER — ONDANSETRON HCL 4 MG/2ML IJ SOLN
INTRAMUSCULAR | Status: AC
  Filled 2023-03-11: qty 2

## 2023-03-11 MED ORDER — VITAMIN D 25 MCG (1000 UNIT) PO TABS
1000.0000 [IU] | ORAL_TABLET | Freq: Every day | ORAL | Status: DC
  Administered 2023-03-11 – 2023-03-12 (×2): 1000 [IU] via ORAL

## 2023-03-11 MED ORDER — MAGNESIUM CITRATE PO SOLN: 1.0000 | Freq: Once | ORAL | Status: DC | PRN

## 2023-03-11 MED ORDER — ROCURONIUM BROMIDE 10 MG/ML (PF) SYRINGE
PREFILLED_SYRINGE | INTRAVENOUS | Status: AC
  Filled 2023-03-11: qty 10

## 2023-03-11 MED ORDER — ROCURONIUM BROMIDE 100 MG/10ML IV SOLN
INTRAVENOUS | Status: DC | PRN
  Administered 2023-03-11: 10 mg via INTRAVENOUS
  Administered 2023-03-11: 60 mg via INTRAVENOUS
  Administered 2023-03-11: 20 mg via INTRAVENOUS

## 2023-03-11 MED ORDER — KETAMINE HCL 10 MG/ML IJ SOLN
INTRAMUSCULAR | Status: DC | PRN
  Administered 2023-03-11: 10 mg via INTRAVENOUS
  Administered 2023-03-11: 20 mg via INTRAVENOUS

## 2023-03-11 MED ORDER — SODIUM CHLORIDE 0.9 % IV SOLN: INTRAVENOUS | Status: DC

## 2023-03-11 MED ORDER — FLUTICASONE PROPIONATE 50 MCG/ACT NA SUSP: 1.0000 | Freq: Every day | NASAL | Status: DC | PRN

## 2023-03-11 MED ORDER — SODIUM CHLORIDE (PF) 0.9 % IJ SOLN
INTRAMUSCULAR | Status: DC | PRN
  Administered 2023-03-11: 60 mL

## 2023-03-11 MED ORDER — DOCUSATE SODIUM 100 MG PO CAPS
100.0000 mg | ORAL_CAPSULE | Freq: Two times a day (BID) | ORAL | Status: DC
  Administered 2023-03-11 – 2023-03-12 (×3): 100 mg via ORAL

## 2023-03-11 MED ORDER — KETAMINE HCL 50 MG/5ML IJ SOSY
PREFILLED_SYRINGE | INTRAMUSCULAR | Status: AC
  Filled 2023-03-11: qty 5

## 2023-03-11 MED ORDER — SENNA 8.6 MG PO TABS
ORAL_TABLET | ORAL | Status: AC
  Filled 2023-03-11: qty 1

## 2023-03-11 MED ORDER — ACETAMINOPHEN 10 MG/ML IV SOLN
INTRAVENOUS | Status: DC | PRN
  Administered 2023-03-11: 1000 mg via INTRAVENOUS

## 2023-03-11 MED ORDER — MONTELUKAST SODIUM 10 MG PO TABS
10.0000 mg | ORAL_TABLET | Freq: Every day | ORAL | Status: DC
  Administered 2023-03-11: 10 mg via ORAL
  Filled 2023-03-11: qty 1

## 2023-03-11 MED ORDER — DOCUSATE SODIUM 100 MG PO CAPS
ORAL_CAPSULE | ORAL | Status: AC
  Filled 2023-03-11: qty 1

## 2023-03-11 MED ORDER — SURGIFLO WITH THROMBIN (HEMOSTATIC MATRIX KIT) OPTIME
TOPICAL | Status: DC | PRN
  Administered 2023-03-11: 1 via TOPICAL

## 2023-03-11 MED ORDER — ACETAMINOPHEN 10 MG/ML IV SOLN
INTRAVENOUS | Status: AC
  Filled 2023-03-11: qty 100

## 2023-03-11 MED ORDER — ACETAMINOPHEN 500 MG PO TABS
1000.0000 mg | ORAL_TABLET | Freq: Four times a day (QID) | ORAL | Status: DC
  Administered 2023-03-11 – 2023-03-12 (×4): 1000 mg via ORAL

## 2023-03-11 MED ORDER — FAMOTIDINE 20 MG PO TABS
ORAL_TABLET | ORAL | Status: AC
  Filled 2023-03-11: qty 1

## 2023-03-11 MED ORDER — EPHEDRINE SULFATE (PRESSORS) 50 MG/ML IJ SOLN
INTRAMUSCULAR | Status: DC | PRN
  Administered 2023-03-11: 5 mg via INTRAVENOUS
  Administered 2023-03-11: 10 mg via INTRAVENOUS
  Administered 2023-03-11 (×2): 5 mg via INTRAVENOUS

## 2023-03-11 MED ORDER — VITAMIN D 25 MCG (1000 UNIT) PO TABS
ORAL_TABLET | ORAL | Status: AC
  Filled 2023-03-11: qty 1

## 2023-03-11 MED ORDER — OXYCODONE HCL 5 MG PO TABS: 15.0000 mg | ORAL_TABLET | ORAL | Status: DC | PRN

## 2023-03-11 MED ORDER — PAROXETINE HCL 20 MG PO TABS
40.0000 mg | ORAL_TABLET | Freq: Every day | ORAL | Status: DC
  Administered 2023-03-11: 40 mg via ORAL
  Filled 2023-03-11: qty 2

## 2023-03-11 MED ORDER — NORTRIPTYLINE HCL 10 MG PO CAPS
10.0000 mg | ORAL_CAPSULE | Freq: Every day | ORAL | Status: DC
  Administered 2023-03-11: 10 mg via ORAL
  Filled 2023-03-11: qty 1

## 2023-03-11 MED ORDER — HYDROMORPHONE HCL 1 MG/ML IJ SOLN
INTRAMUSCULAR | Status: AC
  Filled 2023-03-11: qty 1

## 2023-03-11 MED ORDER — CEFAZOLIN SODIUM-DEXTROSE 2-4 GM/100ML-% IV SOLN
INTRAVENOUS | Status: AC
  Filled 2023-03-11: qty 100

## 2023-03-11 MED ORDER — LAMOTRIGINE 100 MG PO TABS
200.0000 mg | ORAL_TABLET | Freq: Every day | ORAL | Status: DC
  Administered 2023-03-11: 200 mg via ORAL
  Filled 2023-03-11: qty 2

## 2023-03-11 MED ORDER — PHENYLEPHRINE HCL-NACL 20-0.9 MG/250ML-% IV SOLN
INTRAVENOUS | Status: DC | PRN
  Administered 2023-03-11: 40 ug/min via INTRAVENOUS

## 2023-03-11 MED ORDER — POLYETHYLENE GLYCOL 3350 17 G PO PACK: 17.0000 g | PACK | Freq: Every day | ORAL | Status: DC | PRN

## 2023-03-11 MED ORDER — HYDROMORPHONE HCL 1 MG/ML IJ SOLN: 1.0000 mg | INTRAMUSCULAR | Status: DC | PRN

## 2023-03-11 MED ORDER — GABAPENTIN 300 MG PO CAPS
ORAL_CAPSULE | ORAL | Status: AC
  Filled 2023-03-11: qty 2

## 2023-03-11 MED ORDER — ONDANSETRON HCL 4 MG/2ML IJ SOLN
INTRAMUSCULAR | Status: DC | PRN
  Administered 2023-03-11: 4 mg via INTRAVENOUS

## 2023-03-11 MED ORDER — OXYCODONE HCL 5 MG/5ML PO SOLN: 5.0000 mg | Freq: Once | ORAL | Status: AC | PRN

## 2023-03-11 MED ORDER — CHLORHEXIDINE GLUCONATE 0.12 % MT SOLN
15.0000 mL | Freq: Once | OROMUCOSAL | Status: AC
  Administered 2023-03-11: 15 mL via OROMUCOSAL

## 2023-03-11 MED ORDER — MIDAZOLAM HCL 2 MG/2ML IJ SOLN
INTRAMUSCULAR | Status: DC | PRN
  Administered 2023-03-11: 2 mg via INTRAVENOUS

## 2023-03-11 MED ORDER — ENOXAPARIN SODIUM 40 MG/0.4ML IJ SOSY
40.0000 mg | PREFILLED_SYRINGE | INTRAMUSCULAR | Status: DC
  Administered 2023-03-12: 40 mg via SUBCUTANEOUS

## 2023-03-11 MED ORDER — NICARDIPINE HCL IN NACL 20-0.86 MG/200ML-% IV SOLN
INTRAVENOUS | Status: AC
  Filled 2023-03-11: qty 200

## 2023-03-11 MED ORDER — PROPOFOL 10 MG/ML IV BOLUS
INTRAVENOUS | Status: AC
  Filled 2023-03-11: qty 40

## 2023-03-11 MED ORDER — KETOROLAC TROMETHAMINE 15 MG/ML IJ SOLN
INTRAMUSCULAR | Status: AC
  Filled 2023-03-11: qty 1

## 2023-03-11 MED ORDER — FENTANYL CITRATE (PF) 100 MCG/2ML IJ SOLN
25.0000 ug | INTRAMUSCULAR | Status: AC | PRN
  Administered 2023-03-11 (×2): 25 ug via INTRAVENOUS

## 2023-03-11 MED ORDER — PROPOFOL 10 MG/ML IV BOLUS
INTRAVENOUS | Status: DC | PRN
Start: 2023-03-11 — End: 2023-03-11
  Administered 2023-03-11: 160 mg via INTRAVENOUS

## 2023-03-11 MED ORDER — BUPIVACAINE HCL (PF) 0.5 % IJ SOLN
INTRAMUSCULAR | Status: AC
  Filled 2023-03-11: qty 30

## 2023-03-11 MED ORDER — DEXAMETHASONE SODIUM PHOSPHATE 10 MG/ML IJ SOLN
INTRAMUSCULAR | Status: DC | PRN
  Administered 2023-03-11: 10 mg via INTRAVENOUS

## 2023-03-11 MED ORDER — SODIUM CHLORIDE FLUSH 0.9 % IV SOLN
INTRAVENOUS | Status: AC
  Filled 2023-03-11: qty 20

## 2023-03-11 MED ORDER — DIPHENHYDRAMINE HCL 50 MG/ML IJ SOLN
INTRAMUSCULAR | Status: AC
  Filled 2023-03-11: qty 1

## 2023-03-11 MED ORDER — CEFAZOLIN SODIUM-DEXTROSE 2-4 GM/100ML-% IV SOLN
2.0000 g | Freq: Once | INTRAVENOUS | Status: AC
  Administered 2023-03-11: 2 g via INTRAVENOUS
  Filled 2023-03-11: qty 100

## 2023-03-11 MED ORDER — GABAPENTIN 300 MG PO CAPS
600.0000 mg | ORAL_CAPSULE | Freq: Three times a day (TID) | ORAL | Status: DC
  Administered 2023-03-11 – 2023-03-12 (×3): 600 mg via ORAL

## 2023-03-11 MED ORDER — SCOPOLAMINE 1 MG/3DAYS TD PT72
MEDICATED_PATCH | TRANSDERMAL | Status: AC
  Filled 2023-03-11: qty 1

## 2023-03-11 MED ORDER — METAXALONE 800 MG PO TABS: 800.0000 mg | ORAL_TABLET | Freq: Four times a day (QID) | ORAL | Status: DC | PRN

## 2023-03-11 MED ORDER — FAMOTIDINE 20 MG PO TABS
20.0000 mg | ORAL_TABLET | Freq: Once | ORAL | Status: AC
  Administered 2023-03-11: 20 mg via ORAL

## 2023-03-11 MED ORDER — SODIUM CHLORIDE 0.9% FLUSH: 3.0000 mL | INTRAVENOUS | Status: DC | PRN

## 2023-03-11 MED ORDER — FIBRIN SEALANT 2 ML SINGLE DOSE KIT
PACK | CUTANEOUS | Status: DC | PRN
  Administered 2023-03-11: 2 mL via TOPICAL

## 2023-03-11 MED ORDER — MENTHOL 3 MG MT LOZG: 1.0000 | LOZENGE | OROMUCOSAL | Status: DC | PRN

## 2023-03-11 MED ORDER — HYDROMORPHONE HCL 1 MG/ML IJ SOLN
0.2500 mg | INTRAMUSCULAR | Status: DC | PRN
  Administered 2023-03-11 (×2): 0.5 mg via INTRAVENOUS

## 2023-03-11 MED ORDER — KETOROLAC TROMETHAMINE 15 MG/ML IJ SOLN
15.0000 mg | Freq: Four times a day (QID) | INTRAMUSCULAR | Status: AC
  Administered 2023-03-11 – 2023-03-12 (×4): 15 mg via INTRAVENOUS

## 2023-03-11 MED ORDER — 0.9 % SODIUM CHLORIDE (POUR BTL) OPTIME
TOPICAL | Status: DC | PRN
  Administered 2023-03-11: 500 mL

## 2023-03-11 MED ORDER — ARIPIPRAZOLE 5 MG PO TABS
5.0000 mg | ORAL_TABLET | Freq: Every day | ORAL | Status: DC
  Administered 2023-03-11: 5 mg via ORAL
  Filled 2023-03-11: qty 1

## 2023-03-11 MED ORDER — SODIUM CHLORIDE 0.9 % IV SOLN: 250.0000 mL | INTRAVENOUS | Status: DC

## 2023-03-11 MED ORDER — ONDANSETRON HCL 4 MG/2ML IJ SOLN: 4.0000 mg | Freq: Four times a day (QID) | INTRAMUSCULAR | Status: DC | PRN

## 2023-03-11 MED ORDER — SCOPOLAMINE 1 MG/3DAYS TD PT72
1.0000 | MEDICATED_PATCH | TRANSDERMAL | Status: DC
  Administered 2023-03-11: 1.5 mg via TRANSDERMAL

## 2023-03-11 MED ORDER — PHENOL 1.4 % MT LIQD: 1.0000 | OROMUCOSAL | Status: DC | PRN

## 2023-03-11 SURGICAL SUPPLY — 53 items
ADH SKN CLS APL DERMABOND .7 (GAUZE/BANDAGES/DRESSINGS) ×2
AGENT HMST KT MTR STRL THRMB (HEMOSTASIS) ×2
ALLOGRAFT BONESTRIP KORE 2.5X5 (Bone Implant) IMPLANT
BASIN KIT SINGLE STR (MISCELLANEOUS) ×2 IMPLANT
BUR NEURO DRILL SOFT 3.0X3.8M (BURR) ×2 IMPLANT
COVERAGE SUPP BRAINLAB NG SPNE (MISCELLANEOUS) IMPLANT
COVERAGE SUPPORT SPINE BRAINLB (MISCELLANEOUS)
DERMABOND ADVANCED .7 DNX12 (GAUZE/BANDAGES/DRESSINGS) ×2 IMPLANT
DRAPE C-ARMOR (DRAPES) IMPLANT
DRAPE LAPAROTOMY 100X77 ABD (DRAPES) ×2 IMPLANT
DRAPE SCAN PATIENT (DRAPES) ×2 IMPLANT
ELECT REM PT RETURN 9FT ADLT (ELECTROSURGICAL) ×2
ELECTRODE REM PT RTRN 9FT ADLT (ELECTROSURGICAL) ×2 IMPLANT
EX-PIN ORTHOLOCK NAV 4X150 (PIN) IMPLANT
FEE CVG SUPP BRAINLAB NG SPNE (MISCELLANEOUS) IMPLANT
GLOVE BIOGEL PI IND STRL 6.5 (GLOVE) ×2 IMPLANT
GLOVE SURG SYN 6.5 ES PF (GLOVE) ×4 IMPLANT
GLOVE SURG SYN 6.5 PF PI (GLOVE) ×4 IMPLANT
GLOVE SURG SYN 8.5 E (GLOVE) ×6 IMPLANT
GLOVE SURG SYN 8.5 PF PI (GLOVE) ×6 IMPLANT
GOWN SRG LRG LVL 4 IMPRV REINF (GOWNS) ×2 IMPLANT
GOWN SRG XL LVL 3 NONREINFORCE (GOWNS) ×2 IMPLANT
GOWN STRL NON-REIN TWL XL LVL3 (GOWNS) ×2
GOWN STRL REIN LRG LVL4 (GOWNS) ×2
GRAFT DURAGEN MATRIX 1WX1L (Tissue) IMPLANT
HOLDER FOLEY CATH W/STRAP (MISCELLANEOUS) ×2 IMPLANT
KIT PREVENA INCISION MGT 13 (CANNISTER) ×2 IMPLANT
KIT SPINAL PRONEVIEW (KITS) ×2 IMPLANT
MANIFOLD NEPTUNE II (INSTRUMENTS) ×2 IMPLANT
MARKER SKIN DUAL TIP RULER LAB (MISCELLANEOUS) ×2 IMPLANT
MARKER SPHERE PSV REFLC 13MM (MARKER) ×14 IMPLANT
NDL SAFETY ECLIP 18X1.5 (MISCELLANEOUS) ×2 IMPLANT
NS IRRIG 1000ML POUR BTL (IV SOLUTION) ×2 IMPLANT
NS IRRIG 500ML POUR BTL (IV SOLUTION) IMPLANT
PACK LAMINECTOMY ARMC (PACKS) ×2 IMPLANT
PAD ARMBOARD 7.5X6 YLW CONV (MISCELLANEOUS) ×2 IMPLANT
ROD RELINE-O LORD 5.5X50MM (Rod) IMPLANT
SCREW LOCK RELINE 5.5 TULIP (Screw) IMPLANT
SCREW RELINE-O POLY 6.5X45 (Screw) IMPLANT
SCREW RELINE-O POLY 6.5X50MM (Screw) IMPLANT
SOLUTION IRRIG SURGIPHOR (IV SOLUTION) ×2 IMPLANT
SURGIFLO W/THROMBIN 8M KIT (HEMOSTASIS) ×2 IMPLANT
SUT DVC VLOC 3-0 CL 6 P-12 (SUTURE) ×2 IMPLANT
SUT ETHILON 3-0 FS-10 30 BLK (SUTURE) ×4
SUT PROLENE 6 0 BV (SUTURE) IMPLANT
SUT VIC AB 0 CT1 27 (SUTURE) ×6
SUT VIC AB 0 CT1 27XCR 8 STRN (SUTURE) ×4 IMPLANT
SUT VIC AB 2-0 CT1 18 (SUTURE) ×4 IMPLANT
SUTURE EHLN 3-0 FS-10 30 BLK (SUTURE) IMPLANT
SYR 10ML LL (SYRINGE) ×2 IMPLANT
SYR 30ML LL (SYRINGE) ×4 IMPLANT
TRAP FLUID SMOKE EVACUATOR (MISCELLANEOUS) ×2 IMPLANT
WATER STERILE IRR 1000ML POUR (IV SOLUTION) ×4 IMPLANT

## 2023-03-11 NOTE — Progress Notes (Signed)
  Chaplain On-Call responded to a page from Nursing Staff, who reported the patient's request to speak with a Chaplain.  Chaplain met the patient as she was being readied for surgery at 0700 hours. The patient stated that  she has written her Health Care Power of Attorney and Living Will Advance Directives information. Her request was for completion by witnesses and a Notary.  Chaplain informed the patient that we rely on Hospital Volunteers to serve as witnesses, and that they are not on-duty until 0800. Also, the availability of an Employee Notary needs to be discovered. Chaplain will refer to the next Chaplain at 0830 for follow-up.  Chaplain provided spiritual and emotional support and prayer.  Chaplain Evelena Peat M.Div., [facility]

## 2023-03-11 NOTE — Op Note (Signed)
Indications: 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 The patient tried and failed conservative management, prompting surgical intervention.  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   Preoperative Note:   Risks of surgery discussed include: infection, bleeding, stroke, coma, death, paralysis, CSF leak, nerve/spinal cord injury, numbness, tingling, weakness, complex regional pain syndrome, recurrent stenosis and/or disc herniation, vascular injury, development of instability, neck/back pain, need for further surgery, persistent symptoms, development of deformity, and the risks of anesthesia. The patient understood these risks and agreed to proceed.  Operative Note:  1. Removal of prior implants 2. Posterolateral arthrodesis L4 to L5 3. Posterior nonsegmental instrumentation L4 to L5 using Nuvasive Reline implants 4. Use of stereotaxis    The patient was brought to the Operating Room, intubated and turned into the prone position. All pressure points were checked and double checked. The prior incision was identified. The patient was prepped and draped in the standard fashion. A full timeout was performed. Preoperative antibiotics were given. The incision was injected with local anesthetic.  The incision was opened with a scalpel, then the soft tissues divided with the Bovie. Self-retaining retractors were placed. The prior implants were identified and exposed. This was used for localization. The paraspinus muscles were reflected laterally in subperiosteal  fashion until the transverse processes were visible. During exposure and removal of the scar, a small durotomy was encountered.  This was unroofed with removal of the scar.  A muscle patch was sewn into place to close the durotomy.  No leakage was noted after the patch was placed.    The stereotactic array was placed.  Stereotactic images were acquired and registered to the patient.  We then used stereotactically guided drill guides to cannulate the pedicles bilaterally from L4 to L5. Bone marrow was aspirated from the drill tracts to mix with the bone graft.    The pedicle screws were placed. Nuvasive Reline screws were used.    After placement of pedicle screws, rods were measured to length, cut, and shaped. The rods were secured using locking caps to manufacturer's specifications. Final AP and lateral radiographs were taken to confirm placement of instrumentation and appropriate alignment. The wound was copiously irrigated, then the external surfaces of the remaining lamina, facet, and transverse processes from L4 to L5 were decorticated. A mixture of allograft and autograft was placed over the decorticated surfaces for arthrodesis.  A drain was placed subfascially.   After hemostasis, the wound was closed in layers with 0 and 2-0 vicryl. 3-0 nylon was used on the skin.  The patient was then flipped supine and extubated with incident. All counts were correct times 2 at the end of the case. No immediate complications were noted.  Manning Charity PA assisted in the entire procedure. An assistant was required for this procedure due to the complexity.  The assistant provided assistance in tissue manipulation and suction, and was required for the successful and safe performance of the procedure. I performed the critical portions of the procedure.   [name] MD

## 2023-03-11 NOTE — H&P (Signed)
Referring Physician:  [name], MD [address] [place],  [place] [address]  Primary Physician:  Dorcas Carrow, DO  History of Present Illness: 03/11/2023 Ms. Gasner returns to attention with continued back pain. She successfully quit smoking.    10/09/2022 Ms. Briel returns to see me.  She continues to have significant positional back pain.  She has done physical therapy without improvement.  07/31/2022 Ms. Kym Scannell is here today with a chief complaint of continued back pain.  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  She has been dealing with multiple family issues that have kept her from intervention.  She has not done any conservative management in the past year.    I have utilized the care everywhere function in epic to review the outside records available from external health systems.  01/16/2022 Ms. Berlin Viereck is a 52 y.o with a history of lumbosacral disease, right rotator cuff tear, recent diagnosis of psoriatic arthritis, eczema, fibromyalgia who is here today with a chief complaint of neck and right sided arm pain. She states is been going on for about a year without any particular inciting event. She does have underlying rotator cuff injury but states that this feels different than that. She describes the pain as burning, stabbing, and shooting with some numbness and tingling particularly with sleep. She denies any exacerbating or relieving factors. She states that the pain radiates from her right neck down the posterior lateral aspect of her right arm and to the elbow without extension beyond the elbow. She denies any similar symptoms on the left. She denies any dexterity changes or disturbance in gait outside of her antalgic gait for chronic lumbosacral disease. She denies any significant weakness.  Conservative measures:  Physical therapy: none  Multimodal medical therapy  including regular antiinflammatories: Morphine, oxycodone, Mobic, Skelaxin Injections: no recent epidural steroid injections  Past Surgery: no previous cervical surgeries   Ethylene Reznick has no symptoms of cervical myelopathy.  The symptoms are causing a significant impact on the patient's life.    Review of Systems:  A 10 point review of systems is negative, except for the pertinent positives and negatives detailed in the HPI.  Past Medical History: Past Medical History:  Diagnosis Date   Allergic rhinitis    Anxiety    takes Ativan daily.  Panic Attack   Arthritis    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, unspecified (HCC)    At risk for long QT syndrome    Chronic back pain    herniated disc/stenosis/scoliosis   DDD (degenerative disc disease), lumbar    Depression    takes Effexor daily   Displacement of lumbar intervertebral disc without myelopathy    Eczema    uses a cream daily as needed   Family history of adverse reaction to anesthesia    pta dad is very hard to wake up;excessive nausea   GAD (generalized anxiety disorder)    Generalized body aches    History of bronchitis    > 53yr ago   Hx of dysplastic nevus 05/10/2020   neck - posterior, Severe Atypia. Shave removal 07/18/2020, margins free   Hypoglycemia    Insomnia    takes Lorazepam nightly   Joint pain    Panic attacks    PONV (postoperative nausea and vomiting)    Primary osteoarthritis of right knee    Pseudoarthrosis of lumbar spine    Recurrent major depression-severe (HCC)  Seasonal allergies    uses Flonase daily   Spinal stenosis of lumbar region    Spondylolisthesis    Torn rotator cuff    Vitamin D deficiency    takes Vit D daily   Wears contact lenses     Past Surgical History: Past Surgical History:  Procedure Laterality Date   BACK SURGERY  2008/2012, x2 in 2016   laminectomy 1st time and 2nd time laminectomy and fusion   COLONOSCOPY WITH PROPOFOL N/A 05/27/2020   Procedure: COLONOSCOPY  WITH PROPOFOL;  Surgeon: Pasty Spillers, MD;  Location: [facility];  Service: Gastroenterology;  Laterality: N/A;  COVID POSITIVE AUGUST 16   MAXIMUM ACCESS (MAS)POSTERIOR LUMBAR INTERBODY FUSION (PLIF) 2 LEVEL N/A 09/01/2014   Procedure: Lumbar four-five, Lumbar five-Sacral one Maximum Access Surgery posterior lumbar interbody fusion with interbody prosthesis posterior lateral arthrodesis posterior segmental instrumentation;  Surgeon: Mariam Dollar, MD;  Location: [facility];  Service: Neurosurgery;  Laterality: N/A;  Lumbar four-five, Lumbar five-Sacral one Maximum Access Surgery posterior lumbar interbody fusion with i   SHOULDER ARTHROSCOPY WITH SUBACROMIAL DECOMPRESSION AND OPEN ROTATOR C Right 08/18/2021   Procedure: Right shoulder arthroscopic rotator cuff repair, biceps tenodesis, subacromial decompression;  Surgeon: Signa Kell, MD;  Location: [facility];  Service: Orthopedics;  Laterality: Right;    Allergies: Allergies as of 12/18/2022 - Review Complete 11/05/2022  Allergen Reaction Noted   Amoxicillin Diarrhea, Nausea And Vomiting, and Shortness Of Breath 08/23/2021   Shellfish allergy Hives 08/18/2014   Flexeril [cyclobenzaprine] Hives 08/18/2014    Medications: Current Meds  Medication Sig   ARIPiprazole (ABILIFY) 5 MG tablet Take 1 tablet (5 mg total) by mouth daily.   Dupilumab (DUPIXENT) 300 MG/2ML SOPN Inject 300 mg into the skin every 14 (fourteen) days.   lamoTRIgine (LAMICTAL) 200 MG tablet Take 1 tablet (200 mg total) by mouth daily.   meloxicam (MOBIC) 7.5 MG tablet Take 1 tablet (7.5 mg total) by mouth 2 (two) times daily.   metaxalone (SKELAXIN) 800 MG tablet Take 1 tablet (800 mg total) by mouth 4 (four) times daily as needed.   montelukast (SINGULAIR) 10 MG tablet TAKE 1 TABLET BY MOUTH AT BEDTIME   morphine (MS CONTIN) 30 MG 12 hr tablet Take 1 tablet (30 mg total) by mouth daily.   nortriptyline (PAMELOR) 10 MG capsule Take 1 capsule (10 mg  total) by mouth at bedtime.   oxyCODONE (ROXICODONE) 15 MG immediate release tablet Take 1 tablet (15 mg total) by mouth every 6 (six) hours as needed (break through pain).   PARoxetine (PAXIL) 40 MG tablet Take 1 tablet (40 mg total) by mouth daily.    Social History: Social History   Tobacco Use   Smoking status: Former    Current packs/day: 0.00    Average packs/day: 0.5 packs/day for 20.0 years (10.0 ttl pk-yrs)    Types: Cigarettes    Start date: 10/18/1996    Quit date: 10/18/2016    Years since quitting: 6.3   Smokeless tobacco: Never  Vaping Use   Vaping status: Never Used  Substance Use Topics   Alcohol use: No   Drug use: No    Family Medical History: Family History  Problem Relation Age of Onset   Arthritis Mother    Hyperlipidemia Mother    Migraines Mother    Arthritis Father    Diabetes Father    Heart disease Father    Atrial fibrillation Father    Diabetes Brother    Breast  cancer Maternal Grandmother    Mental illness Neg Hx     Physical Examination: Vitals:   03/11/23 0637 03/11/23 0649  BP: 129/64 (!) 159/64  Pulse: 75 75  Resp: 18 18  Temp:  98.2 F (36.8 C)  SpO2: (!) 75% 99%   Heart sounds normal no MRG. Chest Clear to Auscultation Bilaterally.   General: Patient is well developed, well nourished, calm, collected, and in no apparent distress. Attention to examination is appropriate.  Neck:   Supple.  Full range of motion.  Respiratory: Patient is breathing without any difficulty.   NEUROLOGICAL:     Awake, alert, oriented to person, place, and time.  Speech is clear and fluent. Fund of knowledge is appropriate.   Cranial Nerves: Pupils equal round and reactive to light.  Facial tone is symmetric.  Facial sensation is symmetric. Shoulder shrug is symmetric. Tongue protrusion is midline.  There is no pronator drift.  ROM of spine: full.    Strength: Side Biceps Triceps Deltoid Interossei Grip Wrist Ext. Wrist Flex.  R 5 5 5 5 5 5 5    L 5 5 5 5 5 5 5    Side Iliopsoas Quads Hamstring PF DF EHL  R 5 5 5 5 5 5   L 5 5 5 5 5 5    Reflexes are 1+ and symmetric at the biceps, triceps, brachioradialis, patella and achilles.   Hoffman's is absent.   Bilateral upper and lower extremity sensation is intact to light touch.    No evidence of dysmetria noted.  Gait is untested   Medical Decision Making  Imaging: MR C spine 02/09/22 IMPRESSION: 1. Cervical spondylosis, as outlined. 2. No significant spinal canal stenosis. 3. Multilevel neural foraminal narrowing, greatest on the left at C3-C4 (mild-to-moderate), on the right at C4-C5 (moderate) and on the right at C5-C6 (moderate). 4. Facet arthrosis, greatest on the right at C4-C5 and C5-C6 (advanced at these sites). 5. No more than mild disc degeneration. 6. Minimal grade 1 anterolisthesis at C5-C6.     Electronically Signed   By: Jackey Loge D.O.   On: 02/09/2022 18:05  CT L spine 11/21/20   IMPRESSION: Solid fusion L3-4 without stenosis   Interbody fusion L4-5. There is lucency throughout the majority of the disc space however there may be an area of solid fusion anteriorly   Pedicle screw and interbody fusion L5-S1. No evidence of solid interbody fusion at this level.     Electronically Signed   By: Marlan Palau M.D.   On: 11/21/2020 15:45  CT L spine 08/10/2022  IMPRESSION: 1. No evidence of acute fracture or malalignment. 2. Solid L3-L4 fusion. 3. L4-L5 interbody fusion. The majority of the L4-L5 disc remains lucent but there may be a small area of bony bridging anteriorly, similar. 4. L5-S1 PLIF without evidence of hardware fracture or loosening.     Electronically Signed   By: Feliberto Harts M.D.   On: 08/14/2022 15:07 I have personally reviewed the images and agree with the above interpretation.  Assessment and Plan: Ms. Tilley is a pleasant 52 y.o. female with back pain with left-sided sciatica, left hip pain, and  pseudoarthrosis at L4-5.  She has had previous radiographic demonstration of a focal kyphosis at L4-5 due to instability and pseudoarthrosis at this level. We will proceed with reinstrumentation at L4-5 to form a solid fusion at L4-5.  She does not have a solid fusion at this time.      Kalyse Meharg K. Myer Haff MD,  [facility] Neurosurgery

## 2023-03-11 NOTE — Anesthesia Procedure Notes (Signed)
Procedure Name: Intubation Date/Time: 03/11/2023 7:32 AM  Performed by: Emeterio Reeve, CRNAPre-anesthesia Checklist: Patient identified, Emergency Drugs available, Suction available and Patient being monitored Patient Re-evaluated:Patient Re-evaluated prior to induction Oxygen Delivery Method: Circle system utilized Preoxygenation: Pre-oxygenation with 100% oxygen Induction Type: IV induction Ventilation: Mask ventilation without difficulty Laryngoscope Size: McGraph and 3 Tube type: Oral Tube size: 7.0 mm Number of attempts: 1 Airway Equipment and Method: Stylet and Oral airway Placement Confirmation: ETT inserted through vocal cords under direct vision, positive ETCO2 and breath sounds checked- equal and bilateral Secured at: 22 cm Tube secured with: Tape Dental Injury: Teeth and Oropharynx as per pre-operative assessment  Comments: Intubation by Allayne Butcher, SRNA

## 2023-03-11 NOTE — Progress Notes (Signed)
   03/11/23 1300  Spiritual Encounters  Type of Visit Follow up  Reason for visit Advance directives   No notary currently available. Chaplain spiritual support services remain available.

## 2023-03-11 NOTE — Transfer of Care (Signed)
Immediate Anesthesia Transfer of Care Note  Patient: Jamie [name]  Procedure(s) Performed: OPEN L4-5 FUSION (Back) APPLICATION OF INTRAOPERATIVE CT SCAN  Patient Location: PACU  Anesthesia Type:General  Level of Consciousness: drowsy  Airway & Oxygen Therapy: Patient Spontanous Breathing and Patient connected to face mask oxygen  Post-op Assessment: Report given to RN and Post -op Vital signs reviewed and stable  Post vital signs: stable  Last Vitals:  Vitals Value Taken Time  BP 107/93 03/11/23 1000  Temp    Pulse 104 03/11/23 1001  Resp 14 03/11/23 1001  SpO2 100 % 03/11/23 1001  Vitals shown include unfiled device data.  Last Pain:  Vitals:   03/11/23 0649  PainSc: 6          Complications: No notable events documented.

## 2023-03-11 NOTE — Anesthesia Postprocedure Evaluation (Signed)
Anesthesia Post Note  Patient: Suleyma Wafer  Procedure(s) Performed: OPEN L4-5 FUSION (Back) APPLICATION OF INTRAOPERATIVE CT SCAN  Patient location during evaluation: PACU Anesthesia Type: General Level of consciousness: awake and alert Pain management: pain level controlled Vital Signs Assessment: post-procedure vital signs reviewed and stable Respiratory status: spontaneous breathing, nonlabored ventilation, respiratory function stable and patient connected to nasal cannula oxygen Cardiovascular status: blood pressure returned to baseline and stable Postop Assessment: no apparent nausea or vomiting Anesthetic complications: no   No notable events documented.   Last Vitals:  Vitals:   03/11/23 1115 03/11/23 1153  BP: 107/67 117/77  Pulse: 89 92  Resp: 19 20  Temp: (!) 36.3 C 36.4 C  SpO2: 97% 97%    Last Pain:  Vitals:   03/11/23 1200  PainSc: 8                  Yevette Edwards

## 2023-03-11 NOTE — Anesthesia Postprocedure Evaluation (Signed)
Anesthesia Post Note  Patient: Jamie [name]  Procedure(s) Performed: OPEN L4-5 FUSION (Back) APPLICATION OF INTRAOPERATIVE CT SCAN  Anesthesia Type: General Anesthetic complications: no   No notable events documented.   Last Vitals:  Vitals:   03/11/23 1115 03/11/23 1153  BP: 107/67 117/77  Pulse: 89 92  Resp: 19 20  Temp: (!) 36.3 C 36.4 C  SpO2: 97% 97%    Last Pain:  Vitals:   03/11/23 1200  PainSc: 8                  Yevette Edwards

## 2023-03-11 NOTE — Anesthesia Preprocedure Evaluation (Signed)
Anesthesia Evaluation  Patient identified by MRN, date of birth, ID band Patient awake    Reviewed: Allergy & Precautions, NPO status , Patient's Chart, lab work & pertinent test results  History of Anesthesia Complications (+) PONV and history of anesthetic complications  Airway Mallampati: I  TM Distance: >3 FB Neck ROM: full    Dental no notable dental hx.    Pulmonary neg pulmonary ROS, former smoker   Pulmonary exam normal        Cardiovascular negative cardio ROS Normal cardiovascular exam     Neuro/Psych  PSYCHIATRIC DISORDERS Anxiety Depression     Neuromuscular disease (chronic back pain, opiod dependent)    GI/Hepatic negative GI ROS, Neg liver ROS,,,  Endo/Other  negative endocrine ROS    Renal/GU      Musculoskeletal   Abdominal   Peds  Hematology negative hematology ROS (+)   Anesthesia Other Findings Past Medical History: No date: Allergic rhinitis No date: Anxiety     Comment:  takes Ativan daily.  Panic Attack No date: Arthritis No date: 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, unspecified (HCC) No date: At risk for long QT syndrome No date: Chronic back pain     Comment:  herniated disc/stenosis/scoliosis No date: DDD (degenerative disc disease), lumbar No date: Depression     Comment:  takes Effexor daily No date: Displacement of lumbar intervertebral disc without myelopathy No date: Eczema     Comment:  uses a cream daily as needed No date: Family history of adverse reaction to anesthesia     Comment:  pta dad is very hard to wake up;excessive nausea No date: GAD (generalized anxiety disorder) No date: Generalized body aches No date: History of bronchitis     Comment:  > 2yr ago 05/10/2020: Hx of dysplastic nevus     Comment:  neck - posterior, Severe Atypia. Shave removal               07/18/2020, margins free No date: Hypoglycemia No date: Insomnia     Comment:  takes Lorazepam nightly No date:  Joint pain No date: Panic attacks No date: PONV (postoperative nausea and vomiting) No date: Primary osteoarthritis of right knee No date: Pseudoarthrosis of lumbar spine No date: Recurrent major depression-severe (HCC) No date: Seasonal allergies     Comment:  uses Flonase daily No date: Spinal stenosis of lumbar region No date: Spondylolisthesis No date: Torn rotator cuff No date: Vitamin D deficiency     Comment:  takes Vit D daily No date: Wears contact lenses  Past Surgical History: 2008/2012, x2 in 2016: BACK SURGERY     Comment:  laminectomy 1st time and 2nd time laminectomy and fusion 05/27/2020: COLONOSCOPY WITH PROPOFOL; N/A     Comment:  Procedure: COLONOSCOPY WITH PROPOFOL;  Surgeon:               Pasty Spillers, MD;  Location: [facility];                Service: Gastroenterology;  Laterality: N/A;  COVID               POSITIVE AUGUST 16 09/01/2014: MAXIMUM ACCESS (MAS)POSTERIOR LUMBAR INTERBODY FUSION  (PLIF) 2 LEVEL; N/A     Comment:  Procedure: Lumbar four-five, Lumbar five-Sacral one               Maximum Access Surgery posterior lumbar interbody fusion               with interbody prosthesis posterior lateral  arthrodesis               posterior segmental instrumentation;  Surgeon: Mariam Dollar, MD;  Location: [facility];  Service:               Neurosurgery;  Laterality: N/A;  Lumbar four-five, Lumbar              five-Sacral one Maximum Access Surgery posterior lumbar               interbody fusion with i 08/18/2021: SHOULDER ARTHROSCOPY WITH SUBACROMIAL DECOMPRESSION AND  OPEN ROTATOR C; Right     Comment:  Procedure: Right shoulder arthroscopic rotator cuff               repair, biceps tenodesis, subacromial decompression;                Surgeon: Signa Kell, MD;  [facility];  Service: Orthopedics;  Laterality: Right;  BMI    Body Mass Index: 27.25 kg/m      Reproductive/Obstetrics negative OB  ROS                             Anesthesia Physical Anesthesia Plan  ASA: 2  Anesthesia Plan: General ETT   Post-op Pain Management: Toradol IV (intra-op)*, Ofirmev IV (intra-op)*, Ketamine IV* and Dilaudid IV   Induction: Intravenous  PONV Risk Score and Plan: 4 or greater and Ondansetron, Dexamethasone, Midazolam, Treatment may vary due to age or medical condition and Scopolamine patch - Pre-op  Airway Management Planned: Oral ETT  Additional Equipment:   Intra-op Plan:   Post-operative Plan: Extubation in OR  Informed Consent: I have reviewed the patients History and Physical, chart, labs and discussed the procedure including the risks, benefits and alternatives for the proposed anesthesia with the patient or authorized representative who has indicated his/her understanding and acceptance.     Dental Advisory Given  Plan Discussed with: Anesthesiologist, CRNA and Surgeon  Anesthesia Plan Comments: (Patient consented for risks of anesthesia including but not limited to:  - adverse reactions to medications - damage to eyes, teeth, lips or other oral mucosa - nerve damage due to positioning  - sore throat or hoarseness - Damage to heart, brain, nerves, lungs, other parts of body or loss of life  Patient voiced understanding.)       Anesthesia Quick Evaluation

## 2023-03-12 ENCOUNTER — Other Ambulatory Visit: Payer: Self-pay

## 2023-03-12 MED ORDER — ACETAMINOPHEN 500 MG PO TABS
ORAL_TABLET | ORAL | Status: AC
  Filled 2023-03-12: qty 2

## 2023-03-12 MED ORDER — CELECOXIB 200 MG PO CAPS
ORAL_CAPSULE | ORAL | Status: AC
  Filled 2023-03-12: qty 1

## 2023-03-12 MED ORDER — CELECOXIB 200 MG PO CAPS
200.0000 mg | ORAL_CAPSULE | Freq: Two times a day (BID) | ORAL | Status: DC
  Administered 2023-03-12: 200 mg via ORAL

## 2023-03-12 MED ORDER — OXYCODONE HCL 5 MG PO TABS
ORAL_TABLET | ORAL | Status: AC
  Filled 2023-03-12: qty 2

## 2023-03-12 MED ORDER — KETOROLAC TROMETHAMINE 15 MG/ML IJ SOLN
INTRAMUSCULAR | Status: AC
  Filled 2023-03-12: qty 1

## 2023-03-12 MED ORDER — KETOROLAC TROMETHAMINE 30 MG/ML IJ SOLN
INTRAMUSCULAR | Status: AC
  Filled 2023-03-12: qty 1

## 2023-03-12 MED ORDER — LIDOCAINE HCL (PF) 1 % IJ SOLN
INTRAMUSCULAR | Status: AC
  Filled 2023-03-12: qty 5

## 2023-03-12 MED ORDER — ENOXAPARIN SODIUM 40 MG/0.4ML IJ SOSY
PREFILLED_SYRINGE | INTRAMUSCULAR | Status: AC
  Filled 2023-03-12: qty 0.4

## 2023-03-12 MED ORDER — OXYCODONE HCL 10 MG PO TABS
10.0000 mg | ORAL_TABLET | ORAL | 0 refills | Status: DC | PRN
  Filled 2023-03-12: qty 30, 5d supply, fill #0

## 2023-03-12 MED ORDER — LIDOCAINE HCL (PF) 1 % IJ SOLN: 5.0000 mL | Freq: Once | INTRAMUSCULAR | Status: DC

## 2023-03-12 MED ORDER — CELECOXIB 200 MG PO CAPS
200.0000 mg | ORAL_CAPSULE | Freq: Two times a day (BID) | ORAL | 0 refills | Status: DC
Start: 2023-03-12 — End: 2023-05-30
  Filled 2023-03-12: qty 60, 30d supply, fill #0

## 2023-03-12 MED ORDER — GABAPENTIN 300 MG PO CAPS
ORAL_CAPSULE | ORAL | Status: AC
  Filled 2023-03-12: qty 2

## 2023-03-12 MED ORDER — DOCUSATE SODIUM 100 MG PO CAPS
ORAL_CAPSULE | ORAL | Status: AC
  Filled 2023-03-12: qty 1

## 2023-03-12 MED ORDER — VITAMIN D 25 MCG (1000 UNIT) PO TABS
ORAL_TABLET | ORAL | Status: AC
  Filled 2023-03-12: qty 1

## 2023-03-12 MED ORDER — MORPHINE SULFATE ER 30 MG PO TBCR
30.0000 mg | EXTENDED_RELEASE_TABLET | Freq: Every day | ORAL | 0 refills | Status: DC
Start: 2023-03-12 — End: 2023-03-15
  Filled 2023-03-12: qty 5, 5d supply, fill #0

## 2023-03-12 MED ORDER — SENNA 8.6 MG PO TABS
ORAL_TABLET | ORAL | Status: AC
  Filled 2023-03-12: qty 1

## 2023-03-12 NOTE — Discharge Instructions (Signed)
  Your surgeon has performed an operation on your lumbar spine (low back) to relieve pressure on one or more nerves. Many times, patients feel better immediately after surgery and can "overdo it." Even if you feel well, it is important that you follow these activity guidelines. If you do not let your back heal properly from the surgery, you can increase the chance of hardware complications and/or return of your symptoms. The following are instructions to help in your recovery once you have been discharged from the hospital.  Do not use NSAIDs for 3 months after surgery.   Activity    No bending, lifting, or twisting ("BLT"). Avoid lifting objects heavier than 10 pounds (gallon milk jug).  Where possible, avoid household activities that involve lifting, bending, pushing, or pulling such as laundry, vacuuming, grocery shopping, and childcare. Try to arrange for help from friends and family for these activities while your back heals.  Increase physical activity slowly as tolerated.  Taking short walks is encouraged, but avoid strenuous exercise. Do not jog, run, bicycle, lift weights, or participate in any other exercises unless specifically allowed by your doctor. Avoid prolonged sitting, including car rides.  Talk to your doctor before resuming sexual activity.  You should not drive until cleared by your doctor.  Until released by your doctor, you should not return to work or school.  You should rest at home and let your body heal.   You may shower three days after your surgery.  After showering, lightly dab your incision dry. Do not take a tub bath or go swimming for 3 weeks, or until approved by your doctor at your follow-up appointment.  If you smoke, we strongly recommend that you quit.  Smoking has been proven to interfere with normal healing in your back and will dramatically reduce the success rate of your surgery. Please contact [facility] (800-QUIT-NOW) and use the resources at  www.[facility].com for assistance in stopping smoking.  Surgical Incision   If you have a dressing on your incision, you may remove it three days after your surgery. Keep your incision area clean and dry.  Your incision was closed with Dermabond glue. The glue should begin to peel away within about a week.  Diet            You may return to your usual diet. Be sure to stay hydrated.  When to Contact Us  Although your surgery and recovery will likely be uneventful, you may have some residual numbness, aches, and pains in your back and/or legs. This is normal and should improve in the next few weeks.  However, should you experience any of the following, contact us immediately: New numbness or weakness Pain that is progressively getting worse, and is not relieved by your pain medications or rest Bleeding, redness, swelling, pain, or drainage from surgical incision Chills or flu-like symptoms Fever greater than 101.0 F (38.3 C) Problems with bowel or bladder functions Difficulty breathing or shortness of breath Warmth, tenderness, or swelling in your calf  Contact Information During office hours (Monday-Friday 9 am to 5 pm), please call your physician at [phone] and ask for Kendelyn Jean After hours and weekends, please call [phone] and speak with the neurosurgeon on call For a life-threatening emergency, call 911 

## 2023-03-12 NOTE — Evaluation (Signed)
Occupational Therapy Evaluation Patient Details Name: Jamie [name] MRN: [id number] DOB: Oct 12, 1970 Today's Date: 03/12/2023   History of Present Illness 52yo female s/p lumbar fusion 03/11/23. PMHx includes anxiety, depression, 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, chronic back pain, DDD, Pseudoarthrosis of lumbar spine, and back surgeries 2008, 2012, 2016 x2, shoulder surgery 2022.   Clinical Impression   Pt seen for OT evaluation this date, POD#1 from lumbar fusion. Prior to hospital admission, pt was independent with mobility, ADL, and IADL, however, limited in standing tolerance and mobility distance 2/2 pain. 2 falls in past 6 months. 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 Pt notes her two aunts will be staying with her to help her parents for x1/wk after surgery.  Currently pt is able to complete bed mobility with modified independence (incr time, effort), CGA for ADL transfers with RW and amb ~8' with RW, and requires PRN MIN A for LB ADL tasks. RLE>LLE pain with ambulating. 7/10-8/10 back pain during session. RN notified. Pt educated in home/routines modifications, bed mobility, ADL transfers, falls prevention strategies, AE/DME, back precautions and how to maintain, and pet care considerations. Pt verbalized understanding of all education/training provided. Pt will benefit from additional skilled OT services while hospitalized. Follow up per surgeon.     Recommendations for follow up therapy are one component of a multi-disciplinary discharge planning process, led by the attending physician.  Recommendations may be updated based on patient status, additional functional criteria and insurance authorization.   Assistance Recommended at Discharge PRN  Patient can return home with the following A little help with walking and/or transfers;A little help with bathing/dressing/bathroom;Assistance with cooking/housework;Assist  for transportation;Help with stairs or ramp for entrance    Functional Status Assessment  Patient has had a recent decline in their functional status and demonstrates the ability to make significant improvements in function in a reasonable and predictable amount of time.  Equipment Recommendations  Other (comment) (2WW)    Recommendations for Other Services       Precautions / Restrictions Precautions Precautions: Fall;Back Precaution Booklet Issued: Yes (comment) Restrictions Weight Bearing Restrictions: No      Mobility Bed Mobility Overal bed mobility: Modified Independent             General bed mobility comments: slow, cautious, no direct assist required, utilized previously learned log roll (she uses that technique at baseline)    Transfers Overall transfer level: Needs assistance Equipment used: Rolling walker (2 wheels) Transfers: Sit to/from Stand Sit to Stand: Min guard                  Balance Overall balance assessment: Needs assistance Sitting-balance support: No upper extremity supported, Feet supported Sitting balance-Leahy Scale: Good     Standing balance support: Bilateral upper extremity supported, During functional activity, Reliant on assistive device for balance Standing balance-Leahy Scale: Fair                             ADL either performed or assessed with clinical judgement   ADL                                         General ADL Comments: Pt currently requires PRN MIN A for LB ADL tasks. Otherwise increased time/effort and use of  AE for LB ADL from seated position. CGA for ADL transfers with RW.     Vision         Perception     Praxis      Pertinent Vitals/Pain Pain Assessment Pain Assessment: 0-10 Pain Score: 8  Pain Location: back, shooting into RLE Pain Descriptors / Indicators: Aching, Sharp, Shooting Pain Intervention(s): Limited activity within patient's tolerance, Monitored  during session, Premedicated before session, Repositioned, Patient requesting pain meds-RN notified     Hand Dominance     Extremity/Trunk Assessment Upper Extremity Assessment Upper Extremity Assessment: Generalized weakness   Lower Extremity Assessment Lower Extremity Assessment: Generalized weakness   Cervical / Trunk Assessment Cervical / Trunk Assessment: Back Surgery   Communication Communication Communication: No difficulties   Cognition Arousal/Alertness: Awake/alert Behavior During Therapy: WFL for tasks assessed/performed Overall Cognitive Status: Within Functional Limits for tasks assessed                                       General Comments       Exercises Other Exercises Other Exercises: Pt educated in home/routines modifications, falls prevention strategies, AE/DME, back precautions and how to maintain, and pet care considerations.   Shoulder Instructions      Home Living Family/patient expects to be discharged to:: Private residence Living Arrangements: Parent (mom and dad) Available Help at Discharge: Family;Available 24 hours/day (pt's 2 aunts are going to stay for 1 week to help with pt's parents) Type of Home: House Home Access: Stairs to enter [employer] of Steps: 2 Entrance Stairs-Rails: Can reach both Home Layout: Two level;Bed/bath upstairs;Able to live on main level with bedroom/bathroom (lives upstairs but could live downstairs if needed)     Bathroom Shower/Tub: Tub/shower unit;Walk-in shower (tub/shower upstairs, walk in downstairs)   Bathroom Toilet: Handicapped height     Home Equipment: Grab bars - tub/shower;BSC/3in1;Shower seat - built in;Adaptive equipment;Other (comment) Adaptive Equipment: Reacher;Sock aid;Long-handled sponge;Long-handled shoe horn Additional Comments: has an adjustable bed      Prior Functioning/Environment Prior Level of Function : Independent/Modified Independent;History of Falls  (last six months);Driving;Working/employed             Mobility Comments: no AD, limited to ~72min standing and shorter distances 2/2 back pain ADLs Comments: indep but pain limited, was working and also caring for her parents (some physical assist), 2 falls in past 59mo (fell out of work chair and fell going up stairs at work)        OT Problem List: Decreased strength;Pain;Decreased range of motion;Decreased activity tolerance;Decreased knowledge of use of DME or AE      OT Treatment/Interventions: Self-care/ADL training;Therapeutic exercise;Therapeutic activities;DME and/or AE instruction;Patient/family education    OT Goals(Current goals can be found in the care plan section) Acute Rehab OT Goals Patient Stated Goal: be independent OT Goal Formulation: With patient Time For Goal Achievement: 03/26/23 Potential to Achieve Goals: Good  OT Frequency: Min 1X/week    Co-evaluation              AM-PAC OT "6 Clicks" Daily Activity     Outcome Measure Help from another person eating meals?: None Help from another person taking care of personal grooming?: None Help from another person toileting, which includes using toliet, bedpan, or urinal?: A Little Help from another person bathing (including washing, rinsing, drying)?: A Little Help from another person to put on and taking off regular  upper body clothing?: None Help from another person to put on and taking off regular lower body clothing?: A Little 6 Click Score: 21   End of Session Equipment Utilized During Treatment: Rolling walker (2 wheels) Nurse Communication: Mobility status;Patient requests pain meds  Activity Tolerance: Patient tolerated treatment well Patient left: in chair;with call bell/phone within reach  OT Visit Diagnosis: Other abnormalities of gait and mobility (R26.89);History of falling (Z91.81);Muscle weakness (generalized) (M62.81)                Time: 6045-4098 OT Time Calculation (min): 29  min Charges:  OT General Charges $OT Visit: 1 Visit OT Evaluation $OT Eval Low Complexity: 1 Low OT Treatments $Self Care/Home Management : 23-37 mins  Arman Filter., MPH, MS, OTR/L ascom [phone] 03/12/23, 9:55 AM

## 2023-03-12 NOTE — Plan of Care (Signed)
  Problem: Education: Goal: Ability to verbalize activity precautions or restrictions will improve Outcome: Progressing   Problem: Activity: Goal: Will remain free from falls Outcome: Progressing   Problem: Clinical Measurements: Goal: Postoperative complications will be avoided or minimized Outcome: Progressing   Problem: Pain Management: Goal: Pain level will decrease Outcome: Progressing   Problem: Bladder/Genitourinary: Goal: Urinary functional status for postoperative course will improve Outcome: Progressing

## 2023-03-12 NOTE — Progress Notes (Signed)
Neurosurgery Progress Note  History: Jamie [name] is s/p L4-5 PSF   POD1: Pt reports sharp pain in her back with movement.  She feels her pain is well-controlled on her current pain medication regimen.  Denies any additional concerns.  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  - mobilize - pain control - DVT prophylaxis -Continue to monitor Hemovac output.  Will likely remove this afternoon. - PTOT; dispo planning underway  Manning Charity PA-C Department of Neurosurgery

## 2023-03-12 NOTE — Discharge Summary (Signed)
Discharge Summary  Patient ID: Jamie [name] MRN: [id number] DOB/AGE: Sep 09, 1970 52 y.o.  Admit date: 03/11/2023 Discharge date: 03/12/2023  Admission Diagnoses: M96.0 Pseudoarthrosis of lumbar spine, M54.42, G89.29 Chronic left-sided low back pain with left-sided sciatica .   Discharge Diagnoses:  Principal Problem:   S/P lumbar fusion Active Problems:   Pseudarthrosis following spinal fusion   Discharged Condition: good  Hospital Course:  Chantia Amalfitano is a 52 year old presenting with pseudoarthrosis and back pain with left-sided sciatica s/p L4-5 PSF .  Her intraoperative course was complicated by a small CSF leak which was repaired intraoperatively.  She was admitted overnight for pain management and drain output management.  Her drain was removed on the afternoon of postop day 1 and a drain stitch was placed.  While she continued to have postoperative back pain, she states is just mostly incisional and that overall things were improved from preop.  She was discharged home on postop day 1 with medications for pain and muscle relaxant.  Consults: None  Significant Diagnostic Studies: none  Treatments: surgery: as above.  Please see separately dictated operative report for further details.  Discharge Exam: Blood pressure 118/64, pulse 69, temperature (!) 97 F (36.1 C), resp. rate 18, height 5\' 7"  (1.702 m), weight 78.9 kg, last menstrual period 06/20/2016, SpO2 99%. AA Ox3 CNI   Strength:5/5 throughout bilateral lower extremities  Disposition: Discharge disposition: 01-Home or Self Care       Discharge Instructions     Incentive spirometry RT   Complete by: As directed       Allergies as of 03/12/2023       Reactions   Amoxicillin Diarrhea, Nausea And Vomiting, Shortness Of Breath   Shellfish Allergy Hives   Flexeril [cyclobenzaprine] Hives   RAPID HEARTBEAT        Medication List     STOP taking these medications    LORazepam 0.5 MG tablet Commonly  known as: Ativan   meloxicam 7.5 MG tablet Commonly known as: MOBIC       TAKE these medications    ARIPiprazole 5 MG tablet Commonly known as: Abilify Take 1 tablet (5 mg total) by mouth daily.   celecoxib 200 MG capsule Commonly known as: CELEBREX Take 1 capsule (200 mg total) by mouth 2 (two) times daily.   cholecalciferol 1000 units tablet Commonly known as: VITAMIN D Take 1,000 Units by mouth daily.   clobetasol cream 0.05 % Commonly known as: TEMOVATE Apply 1 Application topically as directed once daily to affected areas of  hands and feet as needed for flares, avoid face, groin, axilla.   Duobrii 0.01-0.045 % Lotn Generic drug: Halobetasol Prop-Tazarotene Apply 1 application topically at bedtime. qhs to aa rash on hands and feet until clear, then prn flares, avoid face, groin, axilla What changed:  when to take this reasons to take this   Dupixent 300 MG/2ML Sopn Generic drug: Dupilumab Inject 300 mg into the skin every 14 (fourteen) days.   fluticasone 50 MCG/ACT nasal spray Commonly known as: FLONASE Place 1 spray into both nostrils daily as needed for allergies.   gabapentin 600 MG tablet Commonly known as: NEURONTIN TAKE 1 TABLET BY MOUTH THREE TIMES DAILY   lamoTRIgine 200 MG tablet Commonly known as: LaMICtal Take 1 tablet (200 mg total) by mouth daily.   metaxalone 800 MG tablet Commonly known as: SKELAXIN Take 1 tablet (800 mg total) by mouth 4 (four) times daily as needed.   montelukast 10 MG tablet Commonly known as:  SINGULAIR TAKE 1 TABLET BY MOUTH AT BEDTIME   morphine 30 MG 12 hr tablet Commonly known as: MS CONTIN Take 1 tablet (30 mg total) by mouth daily for 5 days.   MULTIVITAMIN ADULT PO Take by mouth daily.   nortriptyline 10 MG capsule Commonly known as: Pamelor Take 1 capsule (10 mg total) by mouth at bedtime.   Oxycodone HCl 10 MG Tabs Take 1 tablet (10 mg total) by mouth every 4 (four) hours as needed for moderate  pain. What changed:  medication strength how much to take when to take this reasons to take this   PARoxetine 40 MG tablet Commonly known as: PAXIL Take 1 tablet (40 mg total) by mouth daily.   tacrolimus 0.1 % ointment Commonly known as: PROTOPIC Apply topically daily at bedtime to hands and feet What changed:  how much to take when to take this reasons to take this   Tavaborole 5 % Soln Apply qhs to affected toenails         Signed: Susanne Borders 03/13/2023, 9:36 AM

## 2023-03-12 NOTE — Evaluation (Signed)
Physical Therapy Evaluation Patient Details Name: Jamie [name] MRN: [id number] DOB: [birth year] Today's Date: 03/12/2023  History of Present Illness  51yo female s/p lumbar fusion 03/11/23. PMHx includes anxiety, depression, 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, chronic back pain, DDD, Pseudoarthrosis of lumbar spine, and back surgeries 2008, 2012, 2016 x2, shoulder surgery 2022.  Clinical Impression  Patient admitted following above procedure. PTA, patient lives with parents and reports independence. Patient presents with weakness and decreased activity tolerance. Educated patient on back precautions and progress mobility, patient verbalized understanding. Overall, patient is functioning at supervision level with RW and able to negotiate 4 stairs with B handrails. Patient will benefit from skilled PT services during acute stay to address listed deficits. Patient will benefit from ongoing therapy at discharge to maximize functional independence and safety.         Assistance Recommended at Discharge Intermittent Supervision/Assistance  If plan is discharge home, recommend the following:  Can travel by private vehicle  A little help with walking and/or transfers;Help with stairs or ramp for entrance;A little help with bathing/dressing/bathroom;Assistance with cooking/housework        Equipment Recommendations Rolling Keyaria Lawson (2 wheels)  Recommendations for Other Services       Functional Status Assessment Patient has had a recent decline in their functional status and demonstrates the ability to make significant improvements in function in a reasonable and predictable amount of time.     Precautions / Restrictions Precautions Precautions: Fall;Back Precaution Booklet Issued: Yes (comment) Restrictions Weight Bearing Restrictions: No      Mobility  Bed Mobility Overal bed mobility: Modified Independent                  Transfers Overall transfer level: Needs assistance Equipment used:  Rolling Harlee Eckroth (2 wheels) Transfers: Sit to/from Stand Sit to Stand: Supervision                Ambulation/Gait Ambulation/Gait assistance: Supervision Gait Distance (Feet): 250 Feet Assistive device: Rolling Treshon Stannard (2 wheels) Gait Pattern/deviations: Step-through pattern, Decreased stride length       General Gait Details: supervision for safety  Stairs Stairs: Yes Stairs assistance: Supervision Stair Management: Two rails, Alternating pattern, Forwards Number of Stairs: 4    Wheelchair Mobility     Tilt Bed    Modified Rankin (Stroke Patients Only)       Balance Overall balance assessment: Mild deficits observed, not formally tested                                           Pertinent Vitals/Pain Pain Assessment Pain Assessment: 0-10 Pain Score: 6  Pain Location: back Pain Descriptors / Indicators: Aching, Sharp, Shooting Pain Intervention(s): Monitored during session, Limited activity within patient's tolerance, Repositioned    Home Living Family/patient expects to be discharged to:: Private residence Living Arrangements: Parent (mom and dad) Available Help at Discharge: Family;Available 24 hours/day (pt's 2 aunts are going to stay for 1 week to help with pt's parents) Type of Home: House Home Access: Stairs to enter Entrance Stairs-Rails: Can reach both Entrance Stairs-Number of Steps: 2   Home Layout: Two level;Bed/bath upstairs;Able to live on main level with bedroom/bathroom (lives upstairs but could live downstairs if needed) Home Equipment: Grab bars - tub/shower;BSC/3in1;Shower seat - built in;Adaptive equipment;Other (comment) Additional Comments: has an adjustable bed    Prior Function Prior Level of Function : Independent/Modified Independent;History of Falls (last  six months);Driving;Working/employed             Mobility Comments: no AD, limited to ~76min standing and shorter distances 2/2 back pain ADLs Comments:  indep but pain limited, was working and also caring for her parents (some physical assist), 2 falls in past 87mo (fell out of work chair and fell going up stairs at work)     Higher education careers adviser        Extremity/Trunk Assessment   Upper Extremity Assessment Upper Extremity Assessment: Generalized weakness    Lower Extremity Assessment Lower Extremity Assessment: Generalized weakness    Cervical / Trunk Assessment Cervical / Trunk Assessment: Back Surgery  Communication   Communication: No difficulties  Cognition Arousal/Alertness: Awake/alert Behavior During Therapy: WFL for tasks assessed/performed Overall Cognitive Status: Within Functional Limits for tasks assessed                                          General Comments      Exercises     Assessment/Plan    PT Assessment Patient needs continued PT services  PT Problem List Decreased strength;Decreased activity tolerance;Decreased balance;Decreased mobility;Decreased knowledge of precautions;Decreased knowledge of use of DME       PT Treatment Interventions DME instruction;Gait training;Functional mobility training;Therapeutic activities;Stair training;Therapeutic exercise;Balance training;Patient/family education    PT Goals (Current goals can be found in the Care Plan section)  Acute Rehab PT Goals Patient Stated Goal: to reduce pain PT Goal Formulation: With patient Time For Goal Achievement: 03/26/23 Potential to Achieve Goals: Good    Frequency 7X/week     Co-evaluation               AM-PAC PT "6 Clicks" Mobility  Outcome Measure Help needed turning from your back to your side while in a flat bed without using bedrails?: None Help needed moving from lying on your back to sitting on the side of a flat bed without using bedrails?: None Help needed moving to and from a bed to a chair (including a wheelchair)?: A Little Help needed standing up from a chair using your arms (e.g.,  wheelchair or bedside chair)?: A Little Help needed to walk in hospital room?: A Little Help needed climbing 3-5 steps with a railing? : A Little 6 Click Score: 20    End of Session   Activity Tolerance: Patient tolerated treatment well Patient left: in bed;with call bell/phone within reach Nurse Communication: Mobility status PT Visit Diagnosis: Unsteadiness on feet (R26.81);Muscle weakness (generalized) (M62.81)    Time: 1610-9604 PT Time Calculation (min) (ACUTE ONLY): 18 min   Charges:   PT Evaluation $PT Eval Low Complexity: 1 Low   PT General Charges $$ ACUTE PT VISIT: 1 Visit         Maylon Peppers, PT, DPT Physical Therapist - [place]  [facility]   Grant Swager A Ebon Ketchum 03/12/2023, 1:00 PM

## 2023-03-13 ENCOUNTER — Telehealth: Payer: Self-pay

## 2023-03-13 NOTE — Transitions of Care (Post Inpatient/ED Visit) (Signed)
03/13/2023  Name: Jamie [name] MRN: [id number] DOB: 16-Oct-1970  Today's TOC FU Call Status: Today's TOC FU Call Status:: Successful TOC FU Call Competed TOC FU Call Complete Date: 03/13/23  Transition Care Management Follow-up Telephone Call Date of Discharge: 03/12/23 Discharge Facility: [facility]) Type of Discharge: Inpatient Admission Primary Inpatient Discharge Diagnosis:: arthrodesis How have you been since you were released from the hospital?: Better Any questions or concerns?: No  Items Reviewed: Did you receive and understand the discharge instructions provided?: Yes Medications obtained,verified, and reconciled?: Yes (Medications Reviewed) Any new allergies since your discharge?: No Dietary orders reviewed?: Yes Do you have support at home?: Yes People in Home: parent(s)  Medications Reviewed Today: Medications Reviewed Today     Reviewed by Karena Addison, LPN (Licensed Practical Nurse) on 03/13/23 at 0932  Med List Status: <None>   Medication Order Taking? Sig Documenting Provider Last Dose Status Informant  ARIPiprazole (ABILIFY) 5 MG tablet [id number] No Take 1 tablet (5 mg total) by mouth daily. Cleotis Nipper, MD 03/10/2023 Active   celecoxib (CELEBREX) 200 MG capsule [id number]  Take 1 capsule (200 mg total) by mouth 2 (two) times daily. Susanne Borders, PA  Active   cholecalciferol (VITAMIN D) 1000 UNITS tablet [id number] No Take 1,000 Units by mouth daily. [provider] 03/04/2023 Active Self  clobetasol cream (TEMOVATE) 0.05 % [id number] No Apply 1 Application topically as directed once daily to affected areas of  hands and feet as needed for flares, avoid face, groin, axilla. Willeen Niece, MD prn Active   Dupilumab (DUPIXENT) 300 MG/2ML SOPN [id number] No Inject 300 mg into the skin every 14 (fourteen) days. Quentin Angst, MD 03/02/2023 Active   fluticasone (FLONASE) 50 MCG/ACT nasal spray [id number] No Place 1  spray into both nostrils daily as needed for allergies. Olevia Perches P, DO 03/09/2023 Active   gabapentin (NEURONTIN) 600 MG tablet [id number] No TAKE 1 TABLET BY MOUTH THREE TIMES DAILY  03/08/2023 Active   Halobetasol Prop-Tazarotene (DUOBRII) 0.01-0.045 % LOTN [id number] No Apply 1 application topically at bedtime. qhs to aa rash on hands and feet until clear, then prn flares, avoid face, groin, axilla  Patient taking differently: Apply 1 application  topically at bedtime as needed. qhs to aa rash on hands and feet until clear, then prn flares, avoid face, groin, axilla   Willeen Niece, MD prn Active   lamoTRIgine (LAMICTAL) 200 MG tablet [id number] No Take 1 tablet (200 mg total) by mouth daily. Cleotis Nipper, MD 03/10/2023 Active   metaxalone (SKELAXIN) 800 MG tablet [id number] No Take 1 tablet (800 mg total) by mouth 4 (four) times daily as needed.  03/10/2023 Active   montelukast (SINGULAIR) 10 MG tablet [id number] No TAKE 1 TABLET BY MOUTH AT BEDTIME Dorcas Carrow, DO 03/10/2023 Expired 03/11/23 2359   morphine (MS CONTIN) 30 MG 12 hr tablet [id number]  Take 1 tablet (30 mg total) by mouth daily for 5 days. Susanne Borders, PA  Active   Multiple Vitamin (MULTIVITAMIN ADULT PO) [id number] No Take by mouth daily. [provider] 03/06/2023 Active Self  nortriptyline (PAMELOR) 10 MG capsule [id number] No Take 1 capsule (10 mg total) by mouth at bedtime. Cleotis Nipper, MD 03/10/2023 Active   Oxycodone HCl 10 MG TABS [id number]  Take 1 tablet (10 mg total) by mouth every 4 (four) hours as needed for moderate pain. Susanne Borders, [place]  Active   PARoxetine (PAXIL) 40 MG tablet [id number] No Take  1 tablet (40 mg total) by mouth daily. Cleotis Nipper, MD 03/10/2023 Active   tacrolimus (PROTOPIC) 0.1 % ointment [id number] No Apply topically daily at bedtime to hands and feet  Patient taking differently: Apply 1 Application topically daily as needed.   Willeen Niece, MD prn Active   Tavaborole 5 %  SOLN [id number] No Apply qhs to affected toenails  Patient not taking: Reported on 03/11/2023   Willeen Niece, MD Not Taking Active             Home Care and Equipment/Supplies: Were Home Health Services Ordered?: NA Any new equipment or medical supplies ordered?: NA  Functional Questionnaire: Do you need assistance with bathing/showering or dressing?: Yes Do you need assistance with meal preparation?: Yes Do you need assistance with eating?: No Do you have difficulty maintaining continence: No Do you have difficulty managing or taking your medications?: No  Follow up appointments reviewed: PCP Follow-up appointment confirmed?: NA Specialist Hospital Follow-up appointment confirmed?: Yes Date of Specialist follow-up appointment?: 03/22/23 Follow-Up Specialty Provider:: neuro surgeon Do you need transportation to your follow-up appointment?: No Do you understand care options if your condition(s) worsen?: Yes-patient verbalized understanding    SIGNATURE Karena Addison, LPN [facility] Nurse Health Advisor Direct Dial [phone]

## 2023-03-14 ENCOUNTER — Encounter: Payer: Self-pay | Admitting: Neurosurgery

## 2023-03-15 ENCOUNTER — Encounter: Payer: 59 | Admitting: Family Medicine

## 2023-03-15 ENCOUNTER — Other Ambulatory Visit: Payer: Self-pay

## 2023-03-15 ENCOUNTER — Other Ambulatory Visit: Payer: Self-pay | Admitting: Neurosurgery

## 2023-03-15 DIAGNOSIS — G8929 Other chronic pain: Secondary | ICD-10-CM

## 2023-03-15 DIAGNOSIS — S32009K Unspecified fracture of unspecified lumbar vertebra, subsequent encounter for fracture with nonunion: Secondary | ICD-10-CM

## 2023-03-15 MED ORDER — MORPHINE SULFATE ER 30 MG PO TBCR
30.0000 mg | EXTENDED_RELEASE_TABLET | Freq: Every day | ORAL | 0 refills | Status: DC
Start: 2023-03-15 — End: 2023-03-20
  Filled 2023-03-15 (×2): qty 5, 5d supply, fill #0

## 2023-03-15 MED ORDER — OXYCODONE HCL 10 MG PO TABS
10.0000 mg | ORAL_TABLET | ORAL | 0 refills | Status: DC | PRN
  Filled 2023-03-15 (×2): qty 30, 5d supply, fill #0

## 2023-03-15 NOTE — Progress Notes (Signed)
PDMP reviewed and appropriate

## 2023-03-19 ENCOUNTER — Encounter: Payer: 59 | Admitting: Neurosurgery

## 2023-03-19 NOTE — Progress Notes (Signed)
   REFERRING PHYSICIAN:  Rosario Adie [address] [place],  [place] [address]  DOS: 03/11/23  PSF L4-L5 for pseudoarthrosis  HISTORY OF PRESENT ILLNESS: Jamie [name] is approximately 2 weeks status post PSF L4-L5. Was given celebrex, MS Contin, and oxycodone on discharge from the hospital.   She continues with LBP and some right hip/leg pain. Minimal left leg pain. She feels like she is improving.   She continues on MS Contin daily and is taking oxycodone- generally takes 2 every 4 hours as needed.    PHYSICAL EXAMINATION:  General: Patient is well developed, well nourished, calm, collected, and in no apparent distress.   NEUROLOGICAL:  General: In no acute distress.   Awake, alert, oriented to person, place, and time.  Pupils equal round and reactive to light.  Facial tone is symmetric.     Strength:            Side Iliopsoas Quads Hamstring PF DF EHL  R 5 5 5 5 5 5   L 5 5 5 5 5 5    Incision c/d/I, sutures removed   ROS (Neurologic):  Negative except as noted above  IMAGING: Nothing new to review.   ASSESSMENT/PLAN:  Jamie [name] is doing reasonably well s/p above surgery. Treatment options reviewed with patient and following plan made:   - I have advised the patient to lift up to 10 pounds until 6 weeks after surgery (follow up with Dr. Myer Haff).  - Reviewed wound care.  - No bending, twisting, or lifting.  - Continue on current medications including MS Contin and oxycodone. MS Contin due for refill on 04/04/23. Oxycodone due 03/25/23. We can prescribe only until that visit.  - Discussed that she will need to get established with pain management so that they can continue her MS Contin and hopefully wean her off at some point. Referrals sent.  - She should have a scheduled appointment with pain management prior to her follow up with Dr. Myer Haff. She will follow up as scheduled in 4 weeks and prn. Will need xrays prior.   Advised to contact the office if any  questions or concerns arise.  Drake Leach PA-C Department of neurosurgery

## 2023-03-19 NOTE — Telephone Encounter (Signed)
Reviewed with Dr. Myer Haff.   We can prescribe the MS Contin for up to 6 weeks postop, but she needs to find a pain management doctor to continue it. If she does not have appointment scheduled with pain management by her 6 week visit with Dr. Myer Haff then we will not longer fill it.   Will discuss with her at her follow up on Friday.   PMP reviewed and is appropriate. Would be due for MS Contin and oxycodone 03/20/23.

## 2023-03-20 ENCOUNTER — Other Ambulatory Visit: Payer: Self-pay

## 2023-03-20 ENCOUNTER — Telehealth: Payer: Self-pay | Admitting: Orthopedic Surgery

## 2023-03-20 DIAGNOSIS — S32009K Unspecified fracture of unspecified lumbar vertebra, subsequent encounter for fracture with nonunion: Secondary | ICD-10-CM

## 2023-03-20 DIAGNOSIS — G8929 Other chronic pain: Secondary | ICD-10-CM

## 2023-03-20 MED ORDER — MORPHINE SULFATE ER 30 MG PO TBCR
30.0000 mg | EXTENDED_RELEASE_TABLET | Freq: Every day | ORAL | 0 refills | Status: DC
Start: 2023-03-20 — End: 2023-04-04
  Filled 2023-03-20: qty 15, 15d supply, fill #0

## 2023-03-20 MED ORDER — OXYCODONE HCL 10 MG PO TABS
10.0000 mg | ORAL_TABLET | ORAL | 0 refills | Status: DC | PRN
  Filled 2023-03-20: qty 30, 5d supply, fill #0

## 2023-03-20 NOTE — Telephone Encounter (Signed)
PSF L4-L5 03/11/23  MS Contin and oxycodone sent to pharmacy. PMP approved and is appropriate. Per Dr. Myer Haff, can do 15 day supply of MS Contin.   Sent her a message

## 2023-03-21 ENCOUNTER — Encounter: Payer: 59 | Admitting: Neurosurgery

## 2023-03-21 ENCOUNTER — Other Ambulatory Visit: Payer: Self-pay

## 2023-03-22 ENCOUNTER — Telehealth: Payer: Self-pay | Admitting: Orthopedic Surgery

## 2023-03-22 ENCOUNTER — Ambulatory Visit (INDEPENDENT_AMBULATORY_CARE_PROVIDER_SITE_OTHER): Payer: 59 | Admitting: Orthopedic Surgery

## 2023-03-22 ENCOUNTER — Encounter: Payer: Self-pay | Admitting: Orthopedic Surgery

## 2023-03-22 VITALS — BP 137/77 | HR 90 | Temp 98.3°F

## 2023-03-22 DIAGNOSIS — M5442 Lumbago with sciatica, left side: Secondary | ICD-10-CM

## 2023-03-22 DIAGNOSIS — S32009K Unspecified fracture of unspecified lumbar vertebra, subsequent encounter for fracture with nonunion: Secondary | ICD-10-CM

## 2023-03-22 DIAGNOSIS — Z981 Arthrodesis status: Secondary | ICD-10-CM

## 2023-03-22 DIAGNOSIS — Z09 Encounter for follow-up examination after completed treatment for conditions other than malignant neoplasm: Secondary | ICD-10-CM

## 2023-03-22 DIAGNOSIS — M960 Pseudarthrosis after fusion or arthrodesis: Secondary | ICD-10-CM

## 2023-03-22 NOTE — Telephone Encounter (Signed)
I put in referral for pain management.   Please send to:  [facility] in [place] [facility] [place] [facility] [place]  Thanks!

## 2023-03-25 ENCOUNTER — Other Ambulatory Visit: Payer: Self-pay

## 2023-03-25 ENCOUNTER — Other Ambulatory Visit (HOSPITAL_COMMUNITY): Payer: Self-pay

## 2023-03-25 ENCOUNTER — Other Ambulatory Visit: Payer: Self-pay | Admitting: Neurosurgery

## 2023-03-25 MED ORDER — METHOCARBAMOL 500 MG PO TABS
500.0000 mg | ORAL_TABLET | Freq: Four times a day (QID) | ORAL | 0 refills | Status: DC
  Filled 2023-03-25: qty 120, 30d supply, fill #0

## 2023-03-25 MED ORDER — OXYCODONE HCL 10 MG PO TABS
10.0000 mg | ORAL_TABLET | Freq: Four times a day (QID) | ORAL | 0 refills | Status: DC | PRN
  Filled 2023-03-25: qty 28, 7d supply, fill #0

## 2023-03-25 NOTE — Progress Notes (Signed)
PDMP reviewed and appropriate.  Patient have a refill of her morphine as she was given 15 days on 7/31. Will refill oxycodone 10 mg at a reduced frequency, q 8 hours.

## 2023-03-25 NOTE — Telephone Encounter (Signed)
Referral faxed to all 3 clinics

## 2023-03-26 ENCOUNTER — Ambulatory Visit (INDEPENDENT_AMBULATORY_CARE_PROVIDER_SITE_OTHER): Payer: 59

## 2023-03-26 DIAGNOSIS — M960 Pseudarthrosis after fusion or arthrodesis: Secondary | ICD-10-CM

## 2023-03-26 DIAGNOSIS — Z981 Arthrodesis status: Secondary | ICD-10-CM

## 2023-03-26 DIAGNOSIS — Z09 Encounter for follow-up examination after completed treatment for conditions other than malignant neoplasm: Secondary | ICD-10-CM

## 2023-03-26 NOTE — Progress Notes (Signed)
Jamie [name] presented today for removal of a residual suture. Her incision was clean, dry, and intact. She tolerated the procedure well and she was instructed to contact our office with any issues/concerns.

## 2023-03-27 ENCOUNTER — Telehealth: Payer: Self-pay | Admitting: Neurosurgery

## 2023-03-27 NOTE — Telephone Encounter (Signed)
Pt called in today reporting a fall. Says she slipped on some steps fell down about 2 steps earlier today and landed first on your knees then somehow flipped and landed on her rear/lower back. Says she was in a little pain prior to the fall but pain has gotten worse since the fall 8/10.  Says the pain is in lower back and is radiating through her right side mostly though she does have some pain on left side as well.  Pt said she had a CSF leak post-op so she is very concerned that her fall could effect this somehow.  CB: [phone]

## 2023-03-28 ENCOUNTER — Other Ambulatory Visit: Payer: Self-pay | Admitting: Neurosurgery

## 2023-03-28 ENCOUNTER — Encounter: Payer: Self-pay | Admitting: Neurosurgery

## 2023-03-28 DIAGNOSIS — S32009K Unspecified fracture of unspecified lumbar vertebra, subsequent encounter for fracture with nonunion: Secondary | ICD-10-CM

## 2023-03-29 ENCOUNTER — Other Ambulatory Visit: Payer: Self-pay | Admitting: Neurosurgery

## 2023-03-29 ENCOUNTER — Other Ambulatory Visit: Payer: Self-pay

## 2023-03-29 ENCOUNTER — Ambulatory Visit: Admission: RE | Admit: 2023-03-29 | Payer: 59 | Source: Ambulatory Visit

## 2023-03-29 ENCOUNTER — Ambulatory Visit
Admission: RE | Admit: 2023-03-29 | Discharge: 2023-03-29 | Disposition: A | Discharge status: Home / Self Care | Payer: 59 | Source: Home / Self Care | Attending: Neurosurgery | Admitting: Neurosurgery

## 2023-03-29 DIAGNOSIS — S32009K Unspecified fracture of unspecified lumbar vertebra, subsequent encounter for fracture with nonunion: Secondary | ICD-10-CM | POA: Insufficient documentation

## 2023-03-29 DIAGNOSIS — Z4789 Encounter for other orthopedic aftercare: Secondary | ICD-10-CM | POA: Diagnosis not present

## 2023-03-29 DIAGNOSIS — M8580 Other specified disorders of bone density and structure, unspecified site: Secondary | ICD-10-CM | POA: Diagnosis not present

## 2023-03-29 DIAGNOSIS — M545 Low back pain, unspecified: Secondary | ICD-10-CM | POA: Diagnosis not present

## 2023-03-29 DIAGNOSIS — Z981 Arthrodesis status: Secondary | ICD-10-CM | POA: Diagnosis not present

## 2023-03-29 MED ORDER — OXYCODONE HCL 5 MG PO TABS
5.0000 mg | ORAL_TABLET | Freq: Four times a day (QID) | ORAL | 0 refills | Status: AC | PRN
  Filled 2023-03-29: qty 12, 3d supply, fill #0

## 2023-04-01 ENCOUNTER — Other Ambulatory Visit: Payer: Self-pay

## 2023-04-01 DIAGNOSIS — Z981 Arthrodesis status: Secondary | ICD-10-CM

## 2023-04-01 DIAGNOSIS — S32009K Unspecified fracture of unspecified lumbar vertebra, subsequent encounter for fracture with nonunion: Secondary | ICD-10-CM

## 2023-04-01 DIAGNOSIS — H9041 Sensorineural hearing loss, unilateral, right ear, with unrestricted hearing on the contralateral side: Secondary | ICD-10-CM | POA: Diagnosis not present

## 2023-04-01 MED ORDER — OXYCODONE HCL 10 MG PO TABS
10.0000 mg | ORAL_TABLET | Freq: Four times a day (QID) | ORAL | 0 refills | Status: DC | PRN
Start: 2023-04-01 — End: 2023-04-08
  Filled 2023-04-01: qty 28, 7d supply, fill #0

## 2023-04-01 NOTE — Telephone Encounter (Signed)
PMP reviewed and is appropriate.   Would be out of oxycodone 10 and also the extra oxycodone 5 that was called in on 03/29/23.   Refill of oxycodone 10mg  sent to pharmacy. Patient advised.

## 2023-04-03 DIAGNOSIS — G8929 Other chronic pain: Secondary | ICD-10-CM | POA: Diagnosis not present

## 2023-04-03 DIAGNOSIS — Z981 Arthrodesis status: Secondary | ICD-10-CM | POA: Diagnosis not present

## 2023-04-04 ENCOUNTER — Other Ambulatory Visit: Payer: Self-pay | Admitting: Neurosurgery

## 2023-04-04 ENCOUNTER — Other Ambulatory Visit: Payer: Self-pay

## 2023-04-04 DIAGNOSIS — S32009K Unspecified fracture of unspecified lumbar vertebra, subsequent encounter for fracture with nonunion: Secondary | ICD-10-CM

## 2023-04-04 DIAGNOSIS — Z981 Arthrodesis status: Secondary | ICD-10-CM

## 2023-04-04 DIAGNOSIS — G8929 Other chronic pain: Secondary | ICD-10-CM

## 2023-04-04 MED ORDER — MORPHINE SULFATE ER 30 MG PO TBCR
30.0000 mg | EXTENDED_RELEASE_TABLET | Freq: Every day | ORAL | 0 refills | Status: DC
Start: 2023-04-04 — End: 2023-04-22
  Filled 2023-04-04: qty 30, 30d supply, fill #0

## 2023-04-04 NOTE — Telephone Encounter (Signed)
Message sent to [facility]- I have not heard back. We were able to log onto [facility] portal and see MyChart message confirming that she voided her pain contract and put in an urgent referral to [facility].   Reviewed with Dr. Myer Haff. Will restart her MS Contin. Danielle sent a 30 day supply.   Her oxycodone is due on Monday so will refill then.   PMP reviewed.   She will need an appointment scheduled with [facility] by her 6 week visit with Dr. Myer Haff or we will not continue to prescribe medications. She is aware.

## 2023-04-04 NOTE — Telephone Encounter (Signed)
Note reviewed from [facility].   She was advised to stop morphine and oxycodone. She signed a Community education officer.   She was given percocet 7.5 to use tid prn.   Reviewed with Dr. Myer Haff and she needs to continue care with pain management. We cannot prescribe anything as she is under contract with them.

## 2023-04-08 ENCOUNTER — Other Ambulatory Visit: Payer: Self-pay

## 2023-04-08 MED ORDER — OXYCODONE HCL 10 MG PO TABS
10.0000 mg | ORAL_TABLET | Freq: Four times a day (QID) | ORAL | 0 refills | Status: DC | PRN
Start: 2023-04-08 — End: 2023-04-15
  Filled 2023-04-08: qty 28, 7d supply, fill #0

## 2023-04-08 NOTE — Telephone Encounter (Signed)
Appt 04/29/2023 at [facility]

## 2023-04-08 NOTE — Addendum Note (Signed)
Addended byDrake Leach on: 04/08/2023 09:12 AM   Modules accepted: Orders

## 2023-04-08 NOTE — Telephone Encounter (Signed)
PMP reviewed and is appropriate. Refill of oxycodone sent to [facility] pharmacy.

## 2023-04-12 DIAGNOSIS — M797 Fibromyalgia: Secondary | ICD-10-CM | POA: Diagnosis not present

## 2023-04-12 DIAGNOSIS — L409 Psoriasis, unspecified: Secondary | ICD-10-CM | POA: Diagnosis not present

## 2023-04-15 ENCOUNTER — Other Ambulatory Visit: Payer: Self-pay

## 2023-04-15 ENCOUNTER — Ambulatory Visit (INDEPENDENT_AMBULATORY_CARE_PROVIDER_SITE_OTHER): Payer: 59 | Admitting: Family Medicine

## 2023-04-15 ENCOUNTER — Encounter: Payer: Self-pay | Admitting: Family Medicine

## 2023-04-15 VITALS — BP 109/69 | HR 79 | Ht 67.0 in | Wt 175.4 lb

## 2023-04-15 DIAGNOSIS — Z1231 Encounter for screening mammogram for malignant neoplasm of breast: Secondary | ICD-10-CM

## 2023-04-15 DIAGNOSIS — M5136 Other intervertebral disc degeneration, lumbar region: Secondary | ICD-10-CM

## 2023-04-15 DIAGNOSIS — F332 Major depressive disorder, recurrent severe without psychotic features: Secondary | ICD-10-CM | POA: Diagnosis not present

## 2023-04-15 DIAGNOSIS — L405 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, unspecified: Secondary | ICD-10-CM | POA: Diagnosis not present

## 2023-04-15 DIAGNOSIS — F411 Generalized anxiety disorder: Secondary | ICD-10-CM | POA: Diagnosis not present

## 2023-04-15 DIAGNOSIS — S32009K Unspecified fracture of unspecified lumbar vertebra, subsequent encounter for fracture with nonunion: Secondary | ICD-10-CM

## 2023-04-15 DIAGNOSIS — Z0000 Encounter for general adult medical examination without abnormal findings: Secondary | ICD-10-CM | POA: Diagnosis not present

## 2023-04-15 DIAGNOSIS — Z981 Arthrodesis status: Secondary | ICD-10-CM

## 2023-04-15 LAB — URINALYSIS, ROUTINE W REFLEX MICROSCOPIC
Bilirubin, UA: NEGATIVE
Glucose, UA: NEGATIVE
Ketones, UA: NEGATIVE
Leukocytes,UA: NEGATIVE
Nitrite, UA: NEGATIVE
Protein,UA: NEGATIVE
Specific Gravity, UA: 1.01 (ref 1.005–1.030)
Urobilinogen, Ur: 0.2 mg/dL (ref 0.2–1.0)
pH, UA: 5.5 (ref 5.0–7.5)

## 2023-04-15 LAB — MICROSCOPIC EXAMINATION
Bacteria, UA: NONE SEEN
WBC, UA: NONE SEEN /hpf (ref 0–5)

## 2023-04-15 MED ORDER — MONTELUKAST SODIUM 10 MG PO TABS
10.0000 mg | ORAL_TABLET | Freq: Every day | ORAL | 3 refills | Status: DC
  Filled 2023-04-15: qty 90, 90d supply, fill #0
  Filled 2023-07-10: qty 90, 90d supply, fill #1
  Filled 2023-11-12: qty 90, 90d supply, fill #2
  Filled 2024-02-26: qty 90, 90d supply, fill #3

## 2023-04-15 MED ORDER — OXYCODONE HCL 10 MG PO TABS
10.0000 mg | ORAL_TABLET | Freq: Four times a day (QID) | ORAL | 0 refills | Status: AC | PRN
Start: 2023-04-15 — End: 2023-04-22
  Filled 2023-04-15 (×2): qty 15, 4d supply, fill #0
  Filled 2023-04-15 (×2): qty 13, 3d supply, fill #0

## 2023-04-15 NOTE — Assessment & Plan Note (Signed)
Continue to follow with psychiatry. Call with any concerns.  ?

## 2023-04-15 NOTE — Assessment & Plan Note (Signed)
Continue to follow with rheumatology. Call with any concerns.  

## 2023-04-15 NOTE — Progress Notes (Signed)
BP 109/69   Pulse 79   Ht 5\' 7"  (1.702 m)   Wt 175 lb 6.4 oz (79.6 kg)   LMP 06/20/2016 (Approximate)   SpO2 99%   BMI 27.47 kg/m    Subjective:    Patient ID: Jamie [name], female    DOB: Dec 23, 1970, 52 y.o.   MRN: [id number]  HPI: Jamie [name] is a 52 y.o. female presenting on 04/15/2023 for comprehensive medical examination. Current medical complaints include:none  Menopausal Symptoms: no  Depression Screen done today and results listed below:     04/15/2023   11:17 AM 01/18/2023    4:09 PM 02/23/2022    9:04 AM 12/21/2021    4:10 PM 11/15/2021    8:41 AM  Depression screen PHQ 2/9  Decreased Interest 2 2 2 2 3   Down, Depressed, Hopeless 2 3 3 2 3   PHQ - 2 Score 4 5 5 4 6   Altered sleeping 3 3 3 3 3   Tired, decreased energy 1 2 1 2 3   Change in appetite 2 3 3 3 3   Feeling bad or failure about yourself  2 3 3 2 3   Trouble concentrating 3 3 3 3 2   Moving slowly or fidgety/restless 3 3 3 3 3   Suicidal thoughts 0 0 0 0 0  PHQ-9 Score 18 22 21 20 23   Difficult doing work/chores Somewhat difficult Somewhat difficult Very difficult     Past Medical History:  Past Medical History:  Diagnosis Date   Allergic rhinitis    Anxiety    takes Ativan daily.  Panic Attack   Arthritis    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, unspecified (HCC)    At risk for long QT syndrome    Chronic back pain    herniated disc/stenosis/scoliosis   DDD (degenerative disc disease), lumbar    Depression    takes Effexor daily   Displacement of lumbar intervertebral disc without myelopathy    Eczema    uses a cream daily as needed   Family history of adverse reaction to anesthesia    pta dad is very hard to wake up;excessive nausea   GAD (generalized anxiety disorder)    Generalized body aches    History of bronchitis    > 73yr ago   Hx of dysplastic nevus 05/10/2020   neck - posterior, Severe Atypia. Shave removal 07/18/2020, margins free   Hypoglycemia    Insomnia    takes Lorazepam nightly   Joint  pain    Panic attacks    PONV (postoperative nausea and vomiting)    Primary osteoarthritis of right knee    Pseudoarthrosis of lumbar spine    Recurrent major depression-severe (HCC)    Seasonal allergies    uses Flonase daily   Spinal stenosis of lumbar region    Spondylolisthesis    Torn rotator cuff    Vitamin D deficiency    takes Vit D daily   Wears contact lenses     Surgical History:  Past Surgical History:  Procedure Laterality Date   APPLICATION OF INTRAOPERATIVE CT SCAN N/A 03/11/2023   Procedure: APPLICATION OF INTRAOPERATIVE CT SCAN;  Surgeon: [name], MD;  Location: [facility];  Service: Neurosurgery;  Laterality: N/A;   BACK SURGERY  2008/2012, x2 in 2016   laminectomy 1st time and 2nd time laminectomy and fusion   COLONOSCOPY WITH PROPOFOL N/A 05/27/2020   Procedure: COLONOSCOPY WITH PROPOFOL;  Surgeon: Pasty Spillers, MD;  Location: [facility];  Service: Gastroenterology;  Laterality: N/A;  COVID POSITIVE AUGUST 16   MAXIMUM ACCESS (MAS)POSTERIOR LUMBAR INTERBODY FUSION (PLIF) 2 LEVEL N/A 09/01/2014   Procedure: Lumbar four-five, Lumbar five-Sacral one Maximum Access Surgery posterior lumbar interbody fusion with interbody prosthesis posterior lateral arthrodesis posterior segmental instrumentation;  Surgeon: Mariam Dollar, MD;  Location: [facility];  Service: Neurosurgery;  Laterality: N/A;  Lumbar four-five, Lumbar five-Sacral one Maximum Access Surgery posterior lumbar interbody fusion with i   SHOULDER ARTHROSCOPY WITH SUBACROMIAL DECOMPRESSION AND OPEN ROTATOR C Right 08/18/2021   Procedure: Right shoulder arthroscopic rotator cuff repair, biceps tenodesis, subacromial decompression;  Surgeon: Signa Kell, MD;  Location: [facility];  Service: Orthopedics;  Laterality: Right;    Medications:  Current Outpatient Medications on File Prior to Visit  Medication Sig   ARIPiprazole (ABILIFY) 5 MG tablet Take 1 tablet (5 mg total) by mouth  daily.   celecoxib (CELEBREX) 200 MG capsule Take 1 capsule (200 mg total) by mouth 2 (two) times daily.   cholecalciferol (VITAMIN D) 1000 UNITS tablet Take 1,000 Units by mouth daily.   clobetasol cream (TEMOVATE) 0.05 % Apply 1 Application topically as directed once daily to affected areas of  hands and feet as needed for flares, avoid face, groin, axilla.   Dupilumab (DUPIXENT) 300 MG/2ML SOPN Inject 300 mg into the skin every 14 (fourteen) days.   fluticasone (FLONASE) 50 MCG/ACT nasal spray Place 1 spray into both nostrils daily as needed for allergies.   gabapentin (NEURONTIN) 600 MG tablet TAKE 1 TABLET BY MOUTH THREE TIMES DAILY   Halobetasol Prop-Tazarotene (DUOBRII) 0.01-0.045 % LOTN Apply 1 application topically at bedtime. qhs to aa rash on hands and feet until clear, then prn flares, avoid face, groin, axilla (Patient taking differently: Apply 1 application  topically at bedtime as needed. qhs to aa rash on hands and feet until clear, then prn flares, avoid face, groin, axilla)   lamoTRIgine (LAMICTAL) 200 MG tablet Take 1 tablet (200 mg total) by mouth daily.   meloxicam (MOBIC) 7.5 MG tablet Take 7.5 mg by mouth 2 (two) times daily.   methocarbamol (ROBAXIN) 500 MG tablet Take 1 tablet (500 mg total) by mouth 4 (four) times daily.   morphine (MS CONTIN) 30 MG 12 hr tablet Take 1 tablet (30 mg total) by mouth daily.   Multiple Vitamin (MULTIVITAMIN ADULT PO) Take by mouth daily.   nortriptyline (PAMELOR) 10 MG capsule Take 1 capsule (10 mg total) by mouth at bedtime.   Oxycodone HCl 10 MG TABS Take 1 tablet (10 mg total) by mouth every 6 (six) hours as needed for up to 7 days.   PARoxetine (PAXIL) 40 MG tablet Take 1 tablet (40 mg total) by mouth daily.   tacrolimus (PROTOPIC) 0.1 % ointment Apply topically daily at bedtime to hands and feet (Patient taking differently: Apply 1 Application topically daily as needed.)   Tavaborole 5 % SOLN Apply qhs to affected toenails   No current  facility-administered medications on file prior to visit.    Allergies:  Allergies  Allergen Reactions   Amoxicillin Diarrhea, Nausea And Vomiting and Shortness Of Breath   Shellfish Allergy Hives   Flexeril [Cyclobenzaprine] Hives    RAPID HEARTBEAT   Wound Dressing Adhesive Hives and Itching    Social History:  Social History   Socioeconomic History   Marital status: Divorced    Spouse name: Not on file   Number of children: Not on file   Years of education: Not on file   Highest education  level: Bachelor's degree (e.g., BA, AB, BS)  Occupational History   Not on file  Tobacco Use   Smoking status: Former    Current packs/day: 0.00    Average packs/day: 0.5 packs/day for 20.0 years (10.0 ttl pk-yrs)    Types: Cigarettes    Start date: 10/18/1996    Quit date: 10/18/2016    Years since quitting: 6.4   Smokeless tobacco: Never  Vaping Use   Vaping status: Never Used  Substance and Sexual Activity   Alcohol use: No   Drug use: No   Sexual activity: Not Currently  Other Topics Concern   Not on file  Social History Narrative   Not on file   Social Determinants of Health   Financial Resource Strain: Low Risk  (01/18/2023)   Overall Financial Resource Strain (CARDIA)    Difficulty of Paying Living Expenses: Not very hard  Food Insecurity: No Food Insecurity (03/11/2023)   Hunger Vital Sign    Worried About Running Out of Food in the Last Year: Never true    Ran Out of Food in the Last Year: Never true  Transportation Needs: No Transportation Needs (03/11/2023)   PRAPARE - Administrator, Civil Service (Medical): No    Lack of Transportation (Non-Medical): No  Physical Activity: Insufficiently Active (01/18/2023)   Exercise Vital Sign    Days of Exercise per Week: 3 days    Minutes of Exercise per Session: 30 min  Stress: Stress Concern Present (01/18/2023)   [employer] of Occupational Health - Occupational Stress Questionnaire    Feeling of Stress :  Very much  Social Connections: Unknown (03/26/2023)   [facility] from [facility]   Social Network    Social Network: Not on file  Intimate Partner Violence: Unknown (03/26/2023)   Received from [facility]   HITS    Physically Hurt: Not on file    Insult or Talk Down To: Not on file    Threaten Physical Harm: Not on file    Scream or Curse: Not on file   Social History   Tobacco Use  Smoking Status Former   Current packs/day: 0.00   Average packs/day: 0.5 packs/day for 20.0 years (10.0 ttl pk-yrs)   Types: Cigarettes   Start date: 10/18/1996   Quit date: 10/18/2016   Years since quitting: 6.4  Smokeless Tobacco Never   Social History   Substance and Sexual Activity  Alcohol Use No    Family History:  Family History  Problem Relation Age of Onset   Arthritis Mother    Hyperlipidemia Mother    Migraines Mother    Arthritis Father    Diabetes Father    Heart disease Father    Atrial fibrillation Father    Diabetes Brother    Breast cancer Maternal Grandmother    Mental illness Neg Hx     Past medical history, surgical history, medications, allergies, family history and social history reviewed with patient today and changes made to appropriate areas of the chart.   Review of Systems  Constitutional: Negative.   HENT: Negative.    Eyes: Negative.   Respiratory: Negative.    Cardiovascular: Negative.   Gastrointestinal: Negative.   Genitourinary: Negative.   Musculoskeletal:  Positive for back pain, joint pain and myalgias. Negative for falls and neck pain.  Skin: Negative.   Neurological: Negative.   Endo/Heme/Allergies:  Positive for environmental allergies. Negative for polydipsia. Does not bruise/bleed easily.  Psychiatric/Behavioral:  Positive for depression. Negative for  hallucinations, memory loss, substance abuse and suicidal ideas. The patient is nervous/anxious and has insomnia.    All other ROS negative except what is listed above and in the HPI.       Objective:    BP 109/69   Pulse 79   Ht 5\' 7"  (1.702 m)   Wt 175 lb 6.4 oz (79.6 kg)   LMP 06/20/2016 (Approximate)   SpO2 99%   BMI 27.47 kg/m   Wt Readings from Last 3 Encounters:  04/15/23 175 lb 6.4 oz (79.6 kg)  03/11/23 174 lb (78.9 kg)  03/08/23 173 lb (78.5 kg)    Physical Exam Vitals and nursing note reviewed.  Constitutional:      General: She is not in acute distress.    Appearance: Normal appearance. She is not ill-appearing, toxic-appearing or diaphoretic.  HENT:     Head: Normocephalic and atraumatic.     Right Ear: Tympanic membrane, ear canal and external ear normal. There is no impacted cerumen.     Left Ear: Tympanic membrane, ear canal and external ear normal. There is no impacted cerumen.     Nose: Nose normal. No congestion or rhinorrhea.     Mouth/Throat:     Mouth: Mucous membranes are moist.     Pharynx: Oropharynx is clear. No oropharyngeal exudate or posterior oropharyngeal erythema.  Eyes:     General: No scleral icterus.       Right eye: No discharge.        Left eye: No discharge.     Extraocular Movements: Extraocular movements intact.     Conjunctiva/sclera: Conjunctivae normal.     Pupils: Pupils are equal, round, and reactive to light.  Neck:     Vascular: No carotid bruit.  Cardiovascular:     Rate and Rhythm: Normal rate and regular rhythm.     Pulses: Normal pulses.     Heart sounds: No murmur heard.    No friction rub. No gallop.  Pulmonary:     Effort: Pulmonary effort is normal. No respiratory distress.     Breath sounds: Normal breath sounds. No stridor. No wheezing, rhonchi or rales.  Chest:     Chest wall: No tenderness.  Abdominal:     General: Abdomen is flat. Bowel sounds are normal. There is no distension.     Palpations: Abdomen is soft. There is no mass.     Tenderness: There is no abdominal tenderness. There is no right CVA tenderness, left CVA tenderness, guarding or rebound.     Hernia: No hernia is present.   Genitourinary:    Comments: Breast and pelvic exams deferred with shared decision making Musculoskeletal:        General: No swelling, tenderness, deformity or signs of injury.     Cervical back: Normal range of motion and neck supple. No rigidity. No muscular tenderness.     Right lower leg: No edema.     Left lower leg: No edema.  Lymphadenopathy:     Cervical: No cervical adenopathy.  Skin:    General: Skin is warm and dry.     Capillary Refill: Capillary refill takes less than 2 seconds.     Coloration: Skin is not jaundiced or pale.     Findings: No bruising, erythema, lesion or rash.  Neurological:     General: No focal deficit present.     Mental Status: She is alert and oriented to person, place, and time. Mental status is at baseline.  Cranial Nerves: No cranial nerve deficit.     Sensory: No sensory deficit.     Motor: No weakness.     Coordination: Coordination normal.     Gait: Gait normal.     Deep Tendon Reflexes: Reflexes normal.  Psychiatric:        Mood and Affect: Mood normal.        Behavior: Behavior normal.        Thought Content: Thought content normal.        Judgment: Judgment normal.     Results for orders placed or performed during the hospital encounter of 03/11/23  Type and screen [facility]  Result Value Ref Range   ABO/RH(D) A POS    Antibody Screen NEG    Sample Expiration      03/14/2023,2359 Performed at [facility], [address] [place], [place] [address]       Assessment & Plan:   Problem List Items Addressed This Visit       Musculoskeletal and Integument   DDD (degenerative disc disease), lumbar    Continue to follow with neurosurgery and pain management. Call with any concerns.       Relevant Medications   meloxicam (MOBIC) 7.5 MG tablet   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, unspecified (HCC)    Continue to follow with rheumatology. Call with any concerns.       Relevant Medications    meloxicam (MOBIC) 7.5 MG tablet     Other   GAD (generalized anxiety disorder)    Continue to follow with psychiatry. Call with any concerns.       Recurrent major depression-severe (HCC)    Continue to follow with psychiatry. Call with any concerns.       Other Visit Diagnoses     Routine general medical examination at a health care facility    -  Primary   Vaccines up to date. Screening labs checked today. Pap, mammo and colonoscopy up to date. Continue diet and exercise. Call with any concerns.   Relevant Orders   CBC with Differential/Platelet   Comprehensive metabolic panel   Lipid Panel w/o Chol/HDL Ratio   Urinalysis, Routine w reflex microscopic   TSH   Encounter for screening mammogram for malignant neoplasm of breast       Mammogram ordered today   Relevant Orders   MM 3D SCREENING MAMMOGRAM BILATERAL BREAST        Follow up plan: Return in about 1 year (around 04/14/2024) for physical.   LABORATORY TESTING:  - Pap smear: up to date  IMMUNIZATIONS:   - Tdap: Tetanus vaccination status reviewed: last tetanus booster within 10 years. - Influenza:  Gets through work - Pneumovax: Not applicable - Prevnar: Not applicable - COVID: Refused - HPV: Not applicable - Shingrix vaccine: Up to date  SCREENING: -Mammogram: Up to date  - Colonoscopy: Up to date   PATIENT COUNSELING:   Advised to take 1 mg of folate supplement per day if capable of pregnancy.   Sexuality: Discussed sexually transmitted diseases, partner selection, use of condoms, avoidance of unintended pregnancy  and contraceptive alternatives.   Advised to avoid cigarette smoking.  I discussed with the patient that most people either abstain from alcohol or drink within safe limits (<=14/week and <=4 drinks/occasion for males, <=7/weeks and <= 3 drinks/occasion for females) and that the risk for alcohol disorders and other health effects rises proportionally with the number of drinks per week and  how often a drinker  exceeds daily limits.  Discussed cessation/primary prevention of drug use and availability of treatment for abuse.   Diet: Encouraged to adjust caloric intake to maintain  or achieve ideal body weight, to reduce intake of dietary saturated fat and total fat, to limit sodium intake by avoiding high sodium foods and not adding table salt, and to maintain adequate dietary potassium and calcium preferably from fresh fruits, vegetables, and low-fat dairy products.    stressed the importance of regular exercise  Injury prevention: Discussed safety belts, safety helmets, smoke detector, smoking near bedding or upholstery.   Dental health: Discussed importance of regular tooth brushing, flossing, and dental visits.    NEXT PREVENTATIVE PHYSICAL DUE IN 1 YEAR. Return in about 1 year (around 04/14/2024) for physical.

## 2023-04-15 NOTE — Telephone Encounter (Signed)
PMP reviewed and is appropriate. Refill for oxycodone sent to pharmacy.

## 2023-04-15 NOTE — Patient Instructions (Signed)
Please call to schedule your mammogram and/or bone density: [facility]  Address: [address], [place], [place] [address] Phone: [phone]  [facility] [place] [address] [place],  [place]  [address] Phone: [phone]   

## 2023-04-15 NOTE — Assessment & Plan Note (Signed)
Continue to follow with neurosurgery and pain management. Call with any concerns.

## 2023-04-16 ENCOUNTER — Other Ambulatory Visit: Payer: Self-pay

## 2023-04-16 LAB — COMPREHENSIVE METABOLIC PANEL
ALT: 14 IU/L (ref 0–32)
AST: 18 IU/L (ref 0–40)
Albumin: 4.2 g/dL (ref 3.8–4.9)
Alkaline Phosphatase: 113 IU/L (ref 44–121)
BUN/Creatinine Ratio: 11 (ref 9–23)
BUN: 8 mg/dL (ref 6–24)
Bilirubin Total: 0.3 mg/dL (ref 0.0–1.2)
CO2: 23 mmol/L (ref 20–29)
Calcium: 9.4 mg/dL (ref 8.7–10.2)
Chloride: 102 mmol/L (ref 96–106)
Creatinine, Ser: 0.7 mg/dL (ref 0.57–1.00)
Globulin, Total: 2.4 g/dL (ref 1.5–4.5)
Glucose: 107 mg/dL — ABNORMAL HIGH (ref 70–99)
Potassium: 4.2 mmol/L (ref 3.5–5.2)
Sodium: 138 mmol/L (ref 134–144)
Total Protein: 6.6 g/dL (ref 6.0–8.5)
eGFR: 103 mL/min/{1.73_m2} (ref >59)

## 2023-04-16 LAB — CBC WITH DIFFERENTIAL/PLATELET
Basophils Absolute: 0 10*3/uL (ref 0.0–0.2)
Basos: 0 %
EOS (ABSOLUTE): 0 10*3/uL (ref 0.0–0.4)
Eos: 0 %
Hematocrit: 36.4 % (ref 34.0–46.6)
Hemoglobin: 11.8 g/dL (ref 11.1–15.9)
Immature Grans (Abs): 0 10*3/uL (ref 0.0–0.1)
Immature Granulocytes: 0 %
Lymphocytes Absolute: 2.4 10*3/uL (ref 0.7–3.1)
Lymphs: 37 %
MCH: 31.5 pg (ref 26.6–33.0)
MCHC: 32.4 g/dL (ref 31.5–35.7)
MCV: 97 fL (ref 79–97)
Monocytes Absolute: 0.4 10*3/uL (ref 0.1–0.9)
Monocytes: 7 %
Neutrophils Absolute: 3.6 10*3/uL (ref 1.4–7.0)
Neutrophils: 56 %
Platelets: 247 10*3/uL (ref 150–450)
RBC: 3.75 x10E6/uL — ABNORMAL LOW (ref 3.77–5.28)
RDW: 13.6 % (ref 11.7–15.4)
WBC: 6.5 10*3/uL (ref 3.4–10.8)

## 2023-04-16 LAB — LIPID PANEL W/O CHOL/HDL RATIO
Cholesterol, Total: 220 mg/dL — ABNORMAL HIGH (ref 100–199)
HDL: 50 mg/dL (ref >39)
LDL Chol Calc (NIH): 153 mg/dL — ABNORMAL HIGH (ref 0–99)
Triglycerides: 96 mg/dL (ref 0–149)
VLDL Cholesterol Cal: 17 mg/dL (ref 5–40)

## 2023-04-16 LAB — TSH: TSH: 1.4 u[IU]/mL (ref 0.450–4.500)

## 2023-04-17 ENCOUNTER — Other Ambulatory Visit (HOSPITAL_COMMUNITY): Payer: Self-pay

## 2023-04-17 ENCOUNTER — Other Ambulatory Visit: Payer: Self-pay

## 2023-04-17 DIAGNOSIS — S32009K Unspecified fracture of unspecified lumbar vertebra, subsequent encounter for fracture with nonunion: Secondary | ICD-10-CM

## 2023-04-18 ENCOUNTER — Encounter: Payer: Self-pay | Admitting: Neurosurgery

## 2023-04-18 ENCOUNTER — Ambulatory Visit (INDEPENDENT_AMBULATORY_CARE_PROVIDER_SITE_OTHER): Payer: 59 | Admitting: Neurosurgery

## 2023-04-18 VITALS — BP 126/88 | Temp 98.3°F | Ht 67.0 in | Wt 175.0 lb

## 2023-04-18 DIAGNOSIS — S32009K Unspecified fracture of unspecified lumbar vertebra, subsequent encounter for fracture with nonunion: Secondary | ICD-10-CM

## 2023-04-18 DIAGNOSIS — M960 Pseudarthrosis after fusion or arthrodesis: Secondary | ICD-10-CM

## 2023-04-18 DIAGNOSIS — Z09 Encounter for follow-up examination after completed treatment for conditions other than malignant neoplasm: Secondary | ICD-10-CM

## 2023-04-18 NOTE — Progress Notes (Signed)
   REFERRING PHYSICIAN:  No referring provider defined for this encounter.  DOS: 03/11/23  PSF L4-L5 for pseudoarthrosis  HISTORY OF PRESENT ILLNESS: Jamie [name] is status post PSF L4-L5.  She is having good and bad days, but more good days compared to prior to surgery.  She is standing more upright.  She has had some falls.   PHYSICAL EXAMINATION:  General: Patient is well developed, well nourished, calm, collected, and in no apparent distress.   NEUROLOGICAL:  General: In no acute distress.   Awake, alert, oriented to person, place, and time.  Pupils equal round and reactive to light.  Facial tone is symmetric.     Strength:            Side Iliopsoas Quads Hamstring PF DF EHL  R 5 5 5 5 5 5   L 5 5 5 5 5 5    Incision c/d/I, sutures removed   ROS (Neurologic):  Negative except as noted above  IMAGING: No complications noted  ASSESSMENT/PLAN:  Jamie [name] is doing reasonably well s/p above surgery.  I am pleased with her improvements-she seems to be more comfortable today in the office.  I have released her to return to work next week.  I have released her to start exercising to try to strengthen her legs.    We will see her back in 6 weeks.  We reviewed her activity limitations.  [name] MD Department of neurosurgery

## 2023-04-19 ENCOUNTER — Other Ambulatory Visit (HOSPITAL_COMMUNITY): Payer: Self-pay

## 2023-04-19 ENCOUNTER — Other Ambulatory Visit: Payer: Self-pay

## 2023-04-19 DIAGNOSIS — E559 Vitamin D deficiency, unspecified: Secondary | ICD-10-CM | POA: Diagnosis not present

## 2023-04-19 DIAGNOSIS — M5136 Other intervertebral disc degeneration, lumbar region: Secondary | ICD-10-CM | POA: Diagnosis not present

## 2023-04-19 DIAGNOSIS — Z79899 Other long term (current) drug therapy: Secondary | ICD-10-CM | POA: Diagnosis not present

## 2023-04-19 DIAGNOSIS — M129 Arthropathy, unspecified: Secondary | ICD-10-CM | POA: Diagnosis not present

## 2023-04-19 DIAGNOSIS — R5383 Other fatigue: Secondary | ICD-10-CM | POA: Diagnosis not present

## 2023-04-19 DIAGNOSIS — M797 Fibromyalgia: Secondary | ICD-10-CM | POA: Diagnosis not present

## 2023-04-19 DIAGNOSIS — Z6827 Body mass index (BMI) 27.0-27.9, adult: Secondary | ICD-10-CM | POA: Diagnosis not present

## 2023-04-19 DIAGNOSIS — Z131 Encounter for screening for diabetes mellitus: Secondary | ICD-10-CM | POA: Diagnosis not present

## 2023-04-22 ENCOUNTER — Other Ambulatory Visit: Payer: Self-pay

## 2023-04-22 MED ORDER — OXYCODONE HCL 10 MG PO TABS
10.0000 mg | ORAL_TABLET | Freq: Four times a day (QID) | ORAL | 0 refills | Status: DC
  Filled 2023-04-22: qty 28, 7d supply, fill #0

## 2023-04-22 MED ORDER — MORPHINE SULFATE ER 30 MG PO TBCR
30.0000 mg | EXTENDED_RELEASE_TABLET | Freq: Every day | ORAL | 0 refills | Status: DC
  Filled 2023-09-12: qty 7, 7d supply, fill #0

## 2023-04-23 ENCOUNTER — Other Ambulatory Visit: Payer: Self-pay

## 2023-04-25 ENCOUNTER — Encounter: Payer: 59 | Admitting: Neurosurgery

## 2023-04-25 ENCOUNTER — Other Ambulatory Visit: Payer: Self-pay

## 2023-04-25 DIAGNOSIS — R5383 Other fatigue: Secondary | ICD-10-CM | POA: Diagnosis not present

## 2023-04-25 DIAGNOSIS — E559 Vitamin D deficiency, unspecified: Secondary | ICD-10-CM | POA: Diagnosis not present

## 2023-04-25 DIAGNOSIS — M129 Arthropathy, unspecified: Secondary | ICD-10-CM | POA: Diagnosis not present

## 2023-04-25 DIAGNOSIS — Z79899 Other long term (current) drug therapy: Secondary | ICD-10-CM | POA: Diagnosis not present

## 2023-04-25 DIAGNOSIS — Z131 Encounter for screening for diabetes mellitus: Secondary | ICD-10-CM | POA: Diagnosis not present

## 2023-04-25 DIAGNOSIS — M5126 Other intervertebral disc displacement, lumbar region: Secondary | ICD-10-CM | POA: Diagnosis not present

## 2023-04-25 DIAGNOSIS — Z6827 Body mass index (BMI) 27.0-27.9, adult: Secondary | ICD-10-CM | POA: Diagnosis not present

## 2023-04-25 MED ORDER — OXYCODONE HCL 10 MG PO TABS
10.0000 mg | ORAL_TABLET | Freq: Four times a day (QID) | ORAL | 0 refills | Status: DC
Start: 2023-04-25 — End: 2023-05-24
  Filled 2023-04-26: qty 120, 30d supply, fill #0

## 2023-04-25 MED ORDER — MORPHINE SULFATE ER 30 MG PO TBCR
30.0000 mg | EXTENDED_RELEASE_TABLET | Freq: Every day | ORAL | 0 refills | Status: DC
  Filled 2023-04-25 – 2023-05-08 (×2): qty 30, 30d supply, fill #0

## 2023-04-26 ENCOUNTER — Other Ambulatory Visit: Payer: Self-pay

## 2023-04-29 DIAGNOSIS — Z79899 Other long term (current) drug therapy: Secondary | ICD-10-CM | POA: Diagnosis not present

## 2023-05-06 ENCOUNTER — Telehealth (HOSPITAL_BASED_OUTPATIENT_CLINIC_OR_DEPARTMENT_OTHER): Payer: 59 | Admitting: Psychiatry

## 2023-05-06 ENCOUNTER — Encounter (HOSPITAL_COMMUNITY): Payer: Self-pay | Admitting: Psychiatry

## 2023-05-06 ENCOUNTER — Other Ambulatory Visit: Payer: Self-pay

## 2023-05-06 VITALS — Wt 175.0 lb

## 2023-05-06 DIAGNOSIS — F3342 Major depressive disorder, recurrent, in full remission: Secondary | ICD-10-CM

## 2023-05-06 DIAGNOSIS — F410 Panic disorder [episodic paroxysmal anxiety] without agoraphobia: Secondary | ICD-10-CM | POA: Diagnosis not present

## 2023-05-06 DIAGNOSIS — F411 Generalized anxiety disorder: Secondary | ICD-10-CM

## 2023-05-06 MED ORDER — LAMOTRIGINE 200 MG PO TABS
200.0000 mg | ORAL_TABLET | Freq: Every day | ORAL | 0 refills | Status: DC
Start: 2023-05-06 — End: 2023-08-07
  Filled 2023-05-06 – 2023-05-24 (×3): qty 90, 90d supply, fill #0

## 2023-05-06 MED ORDER — PAROXETINE HCL 40 MG PO TABS
40.0000 mg | ORAL_TABLET | Freq: Every day | ORAL | 0 refills | Status: DC
  Filled 2023-05-06 – 2023-05-24 (×3): qty 90, 90d supply, fill #0

## 2023-05-06 MED ORDER — ARIPIPRAZOLE 5 MG PO TABS
5.0000 mg | ORAL_TABLET | Freq: Every day | ORAL | 0 refills | Status: DC
Start: 2023-05-06 — End: 2023-08-07
  Filled 2023-05-06 – 2023-05-24 (×3): qty 90, 90d supply, fill #0
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
## 2023-05-08 ENCOUNTER — Other Ambulatory Visit: Payer: Self-pay

## 2023-05-08 ENCOUNTER — Telehealth (HOSPITAL_COMMUNITY): Payer: Self-pay

## 2023-05-08 NOTE — Telephone Encounter (Signed)
No Xanax but I will give lorazepam 0.5 mg 10 tablet as needed for severe panic attack.  If she agree then call the pharmacy

## 2023-05-08 NOTE — Telephone Encounter (Signed)
We did not prescribe Xanax.  She already taking multiple psychotropic medication including nortriptyline, Paxil, Abilify, gabapentin, Lamictal along with pain medicine.  I was not advised to take Xanax.

## 2023-05-08 NOTE — Telephone Encounter (Signed)
Patient is calling to request a refill on her Xanax - I was unable to find where you had ever prescribed but patient insisted you have given it to her for panic attacks. Please review and advise, thank you

## 2023-05-09 ENCOUNTER — Other Ambulatory Visit: Payer: Self-pay

## 2023-05-09 ENCOUNTER — Other Ambulatory Visit (HOSPITAL_COMMUNITY): Payer: Self-pay

## 2023-05-09 DIAGNOSIS — F410 Panic disorder [episodic paroxysmal anxiety] without agoraphobia: Secondary | ICD-10-CM

## 2023-05-09 MED ORDER — LORAZEPAM 0.5 MG PO TABS: 0.5000 mg | ORAL_TABLET | Freq: Every day | ORAL | 0 refills | Status: DC | PRN

## 2023-05-09 MED ORDER — LORAZEPAM 0.5 MG PO TABS
0.5000 mg | ORAL_TABLET | Freq: Every day | ORAL | 0 refills | Status: DC | PRN
  Filled 2023-05-09: qty 10, 10d supply, fill #0

## 2023-05-11 ENCOUNTER — Encounter (HOSPITAL_COMMUNITY): Payer: Self-pay

## 2023-05-11 ENCOUNTER — Other Ambulatory Visit: Payer: Self-pay

## 2023-05-13 ENCOUNTER — Encounter (HOSPITAL_COMMUNITY): Payer: Self-pay

## 2023-05-13 ENCOUNTER — Other Ambulatory Visit: Payer: Self-pay

## 2023-05-15 ENCOUNTER — Other Ambulatory Visit: Payer: Self-pay

## 2023-05-15 ENCOUNTER — Other Ambulatory Visit (HOSPITAL_COMMUNITY): Payer: Self-pay

## 2023-05-15 NOTE — Progress Notes (Signed)
Specialty Pharmacy Refill Coordination Note  Jamie [name] is a 52 y.o. female contacted today regarding refills of specialty medication(s) Dupilumab .  Patient requested Delivery  on 05/22/23  to verified address [address] [place], [place] [address]   Medication will be filled on 05/21/23.

## 2023-05-21 ENCOUNTER — Telehealth: Payer: Self-pay | Admitting: Pharmacist

## 2023-05-21 NOTE — Telephone Encounter (Signed)
Called patient to schedule an appointment for the [employer] [facility] [employer] [facility]. I was unable to reach the patient so I left a HIPAA-compliant message requesting that the patient return my call.   Luke Van Ausdall, PharmD, BCACP, CPP Clinical Pharmacist [facility] [phone]  

## 2023-05-23 ENCOUNTER — Other Ambulatory Visit (HOSPITAL_BASED_OUTPATIENT_CLINIC_OR_DEPARTMENT_OTHER): Payer: Self-pay

## 2023-05-23 ENCOUNTER — Ambulatory Visit: Payer: 59 | Attending: Family Medicine | Admitting: Pharmacist

## 2023-05-23 DIAGNOSIS — Z79899 Other long term (current) drug therapy: Secondary | ICD-10-CM

## 2023-05-23 NOTE — Progress Notes (Signed)
   S: Patient presents for review of their specialty medication therapy.  Patient is currently taking Dupixent for atopic dermatitis. Patient is managed by Dr. Roseanne Reno for this.   Adherence: confirms. Taking 300 mg subq once every 14 days.  Efficacy: tells me that it's working well for her.  Dosing:  300 mg q2weeks  Dose adjustments: Renal: no dose adjustments (has not been studied) Hepatic: no dose adjustments (has not been studied)  Drug-drug interactions: none  Screening: TB test: completed  Monitoring: S/sx of infection: none S/sx of hypersensitivity: none S/sx of ocular effects: none S/sx of eosinophilia/vasculitis: none  Of note, she endorses generalized joint pain but is unable to tell if it's due to the Dupixent. Will continue to monitor. I encouraged her to look for patterns that may indicate a possible adverse effect.   O:  Lab Results  Component Value Date   WBC 6.5 04/15/2023   HGB 11.8 04/15/2023   HCT 36.4 04/15/2023   MCV 97 04/15/2023   PLT 247 04/15/2023      Chemistry      Component Value Date/Time   NA 138 04/15/2023 1135   K 4.2 04/15/2023 1135   CL 102 04/15/2023 1135   CO2 23 04/15/2023 1135   BUN 8 04/15/2023 1135   CREATININE 0.70 04/15/2023 1135      Component Value Date/Time   CALCIUM 9.4 04/15/2023 1135   ALKPHOS 113 04/15/2023 1135   AST 18 04/15/2023 1135   ALT 14 04/15/2023 1135   BILITOT 0.3 04/15/2023 1135       A/P: 1. Medication review: Patient currently on Dupixent for atopic dermatitis. Reviewed the medication with the patient, including the following: Dupixent is a monoclonal antibody used for the treatment of asthma or atopic dermatitis. Patient educated on purpose, proper use and potential adverse effects of Dupixent. Possible adverse effects include increased risk of infection, ocular effects, vasculitis/eosinophilia, and hypersensitivity reactions. Administer as a SubQ injection and rotate sites. Allow the  medication to reach room temp prior to administration (45 mins for 300 mg syringe or 30 min for 200 mg syringe). Do not shake. Discard any unused portion. No recommendations for any changes.   Butch Penny, PharmD, Patsy Baltimore, CPP Clinical Pharmacist [facility] [phone]

## 2023-05-24 ENCOUNTER — Other Ambulatory Visit: Payer: Self-pay

## 2023-05-24 DIAGNOSIS — Z79899 Other long term (current) drug therapy: Secondary | ICD-10-CM | POA: Diagnosis not present

## 2023-05-24 MED ORDER — MORPHINE SULFATE ER 30 MG PO TBCR
30.0000 mg | EXTENDED_RELEASE_TABLET | Freq: Every day | ORAL | 0 refills | Status: DC
  Filled 2023-05-24 – 2023-06-10 (×2): qty 30, 30d supply, fill #0

## 2023-05-24 MED ORDER — OXYCODONE HCL 10 MG PO TABS
10.0000 mg | ORAL_TABLET | Freq: Four times a day (QID) | ORAL | 0 refills | Status: DC
  Filled 2023-05-24: qty 120, 30d supply, fill #0

## 2023-05-26 NOTE — Progress Notes (Unsigned)
   REFERRING PHYSICIAN:  Rosario Adie [address] [place],  [place] [address]  DOS: 03/11/23  PSF L4-L5 for pseudoarthrosis  HISTORY OF PRESENT ILLNESS:  She was improving at her last visit.   She notes intermittent burning pain in her back along with intermittent left medial thigh spasms and intermittent left lateral thigh pain that can be sharp. She has some numbness and tingling in left leg.   She feels like she is slowly improving.   She is seeing [facility].   PHYSICAL EXAMINATION:  General: Patient is well developed, well nourished, calm, collected, and in no apparent distress.   NEUROLOGICAL:  General: In no acute distress.   Awake, alert, oriented to person, place, and time.  Pupils equal round and reactive to light.  Facial tone is symmetric.     Strength:         Side Iliopsoas Quads Hamstring PF DF EHL  R 5 5 5 5 5 5   L 5 5 5 5 5 5    Incision well healed  ROS (Neurologic):  Negative except as noted above  IMAGING: Lumbar xrays dated 05/30/23:  No complications noted.  Radiology report for above xrays not yet available.   ASSESSMENT/PLAN:  Jamie [name] is doing reasonably well s/p above surgery. Treatment options reviewed with patient and following plan made:   - Can slowly return to activities as tolerated.  - Refill on celebrex, will go down to one a day.  - Refill on robaxin.  - Continue to follow with [facility].  - Follow up with Dr. Myer Haff in 6 months and prn.   Advised to contact the office if any questions or concerns arise.  Drake Leach PA-C Department of neurosurgery

## 2023-05-29 ENCOUNTER — Other Ambulatory Visit: Payer: Self-pay

## 2023-05-29 DIAGNOSIS — S32009K Unspecified fracture of unspecified lumbar vertebra, subsequent encounter for fracture with nonunion: Secondary | ICD-10-CM

## 2023-05-30 ENCOUNTER — Other Ambulatory Visit: Payer: Self-pay

## 2023-05-30 ENCOUNTER — Encounter: Payer: Self-pay | Admitting: Orthopedic Surgery

## 2023-05-30 ENCOUNTER — Ambulatory Visit
Admission: RE | Admit: 2023-05-30 | Discharge: 2023-05-30 | Disposition: A | Discharge status: Home / Self Care | Payer: 59 | Attending: Neurosurgery | Admitting: Neurosurgery

## 2023-05-30 ENCOUNTER — Encounter: Payer: 59 | Admitting: Neurosurgery

## 2023-05-30 ENCOUNTER — Ambulatory Visit
Admission: RE | Admit: 2023-05-30 | Discharge: 2023-05-30 | Disposition: A | Discharge status: Home / Self Care | Payer: 59 | Source: Ambulatory Visit | Attending: Neurosurgery | Admitting: Neurosurgery

## 2023-05-30 ENCOUNTER — Ambulatory Visit: Payer: 59 | Admitting: Orthopedic Surgery

## 2023-05-30 VITALS — BP 130/78 | Temp 98.1°F | Ht 67.0 in | Wt 175.0 lb

## 2023-05-30 DIAGNOSIS — S32009K Unspecified fracture of unspecified lumbar vertebra, subsequent encounter for fracture with nonunion: Secondary | ICD-10-CM | POA: Diagnosis not present

## 2023-05-30 DIAGNOSIS — M960 Pseudarthrosis after fusion or arthrodesis: Secondary | ICD-10-CM

## 2023-05-30 DIAGNOSIS — M48061 Spinal stenosis, lumbar region without neurogenic claudication: Secondary | ICD-10-CM | POA: Diagnosis not present

## 2023-05-30 DIAGNOSIS — M47816 Spondylosis without myelopathy or radiculopathy, lumbar region: Secondary | ICD-10-CM | POA: Diagnosis not present

## 2023-05-30 DIAGNOSIS — Z981 Arthrodesis status: Secondary | ICD-10-CM

## 2023-05-30 DIAGNOSIS — M51369 Other intervertebral disc degeneration, lumbar region without mention of lumbar back pain or lower extremity pain: Secondary | ICD-10-CM | POA: Diagnosis not present

## 2023-05-30 DIAGNOSIS — M549 Dorsalgia, unspecified: Secondary | ICD-10-CM | POA: Diagnosis not present

## 2023-05-30 DIAGNOSIS — Z09 Encounter for follow-up examination after completed treatment for conditions other than malignant neoplasm: Secondary | ICD-10-CM

## 2023-05-30 MED ORDER — CELECOXIB 200 MG PO CAPS
200.0000 mg | ORAL_CAPSULE | Freq: Every day | ORAL | 1 refills | Status: DC
Start: 2023-05-30 — End: 2024-05-19
  Filled 2023-05-30: qty 30, 30d supply, fill #0

## 2023-05-30 MED ORDER — METHOCARBAMOL 500 MG PO TABS
500.0000 mg | ORAL_TABLET | Freq: Four times a day (QID) | ORAL | 0 refills | Status: DC | PRN
Start: 2023-05-30 — End: 2023-10-14
  Filled 2023-05-30 – 2023-06-10 (×2): qty 120, 30d supply, fill #0

## 2023-06-10 ENCOUNTER — Other Ambulatory Visit: Payer: Self-pay

## 2023-06-11 ENCOUNTER — Other Ambulatory Visit: Payer: Self-pay

## 2023-06-11 NOTE — Progress Notes (Signed)
Specialty Pharmacy Refill Coordination Note  Jamie [name] is a 52 y.o. female contacted today regarding refills of specialty medication(s) Dupilumab   Patient requested Delivery   Delivery date: 06/19/23   Verified address: [address] [name], [address]   Medication will be filled on 06/18/23.

## 2023-06-17 ENCOUNTER — Encounter (HOSPITAL_COMMUNITY): Payer: Self-pay

## 2023-06-18 ENCOUNTER — Other Ambulatory Visit (HOSPITAL_COMMUNITY): Payer: Self-pay

## 2023-06-19 ENCOUNTER — Other Ambulatory Visit: Payer: Self-pay

## 2023-06-19 DIAGNOSIS — Z79899 Other long term (current) drug therapy: Secondary | ICD-10-CM | POA: Diagnosis not present

## 2023-06-19 MED ORDER — OXYCODONE HCL 10 MG PO TABS
10.0000 mg | ORAL_TABLET | Freq: Four times a day (QID) | ORAL | 0 refills | Status: DC
  Filled 2023-06-24: qty 120, 30d supply, fill #0

## 2023-06-19 MED ORDER — MORPHINE SULFATE ER 30 MG PO TBCR
30.0000 mg | EXTENDED_RELEASE_TABLET | Freq: Every day | ORAL | 0 refills | Status: DC
  Filled 2023-07-10: qty 30, 30d supply, fill #0

## 2023-06-20 ENCOUNTER — Other Ambulatory Visit: Payer: Self-pay

## 2023-06-24 ENCOUNTER — Other Ambulatory Visit: Payer: Self-pay

## 2023-06-24 DIAGNOSIS — Z79899 Other long term (current) drug therapy: Secondary | ICD-10-CM | POA: Diagnosis not present

## 2023-07-03 ENCOUNTER — Ambulatory Visit
Admission: RE | Admit: 2023-07-03 | Discharge: 2023-07-03 | Disposition: A | Discharge status: Home / Self Care | Payer: 59 | Source: Ambulatory Visit | Attending: Family Medicine | Admitting: Family Medicine

## 2023-07-03 DIAGNOSIS — Z1231 Encounter for screening mammogram for malignant neoplasm of breast: Secondary | ICD-10-CM | POA: Diagnosis not present

## 2023-07-05 ENCOUNTER — Other Ambulatory Visit: Payer: Self-pay

## 2023-07-05 ENCOUNTER — Other Ambulatory Visit: Payer: Self-pay | Admitting: Dermatology

## 2023-07-05 DIAGNOSIS — L309 Dermatitis, unspecified: Secondary | ICD-10-CM

## 2023-07-05 NOTE — Progress Notes (Signed)
Specialty Pharmacy Refill Coordination Note  Jamie [name] is a 52 y.o. female contacted today regarding refills of specialty medication(s) Dupilumab   Patient requested Delivery   Delivery date: 07/16/23   Verified address: [address] [name], [address]   Medication will be filled on 07/15/23.  Refill request pending. Rewrite required.

## 2023-07-08 ENCOUNTER — Other Ambulatory Visit: Payer: Self-pay

## 2023-07-08 ENCOUNTER — Other Ambulatory Visit (HOSPITAL_COMMUNITY): Payer: Self-pay

## 2023-07-08 ENCOUNTER — Other Ambulatory Visit: Payer: Self-pay | Admitting: Pharmacist

## 2023-07-08 DIAGNOSIS — L309 Dermatitis, unspecified: Secondary | ICD-10-CM

## 2023-07-08 MED ORDER — DUPIXENT 300 MG/2ML ~~LOC~~ SOAJ
300.0000 mg | SUBCUTANEOUS | 1 refills | Status: DC
  Filled 2023-07-08: qty 4, 28d supply, fill #0

## 2023-07-08 MED ORDER — DUPIXENT 300 MG/2ML ~~LOC~~ SOAJ
300.0000 mg | SUBCUTANEOUS | 1 refills | Status: DC
  Filled ????-??-??: fill #0

## 2023-07-10 ENCOUNTER — Other Ambulatory Visit: Payer: Self-pay

## 2023-07-11 ENCOUNTER — Other Ambulatory Visit (HOSPITAL_COMMUNITY): Payer: Self-pay | Admitting: Psychiatry

## 2023-07-11 ENCOUNTER — Other Ambulatory Visit: Payer: Self-pay

## 2023-07-15 ENCOUNTER — Other Ambulatory Visit (HOSPITAL_COMMUNITY): Payer: Self-pay

## 2023-07-15 ENCOUNTER — Other Ambulatory Visit: Payer: Self-pay

## 2023-07-15 ENCOUNTER — Telehealth (HOSPITAL_COMMUNITY): Payer: Self-pay | Admitting: *Deleted

## 2023-07-15 DIAGNOSIS — F410 Panic disorder [episodic paroxysmal anxiety] without agoraphobia: Secondary | ICD-10-CM

## 2023-07-15 MED ORDER — LORAZEPAM 0.5 MG PO TABS
0.5000 mg | ORAL_TABLET | Freq: Every day | ORAL | 0 refills | Status: DC | PRN
  Filled 2023-07-15: qty 10, 10d supply, fill #0

## 2023-07-15 NOTE — Telephone Encounter (Signed)
I have provided 10 tablets of Ativan to take as needed for severe anxiety/panic attack.  She need to be careful because she is taking pain medicine and should not be taking together.

## 2023-07-15 NOTE — Telephone Encounter (Signed)
Pt called asking for Lorazepam 0.5 mg refill. Medication last sent on 05/09/23 for #10. Pt's last appointment was on9/18/24 and she has a f/u scheduled for 07/21/23. Please review.

## 2023-07-16 ENCOUNTER — Ambulatory Visit: Payer: 59 | Admitting: Dermatology

## 2023-07-16 DIAGNOSIS — L309 Dermatitis, unspecified: Secondary | ICD-10-CM

## 2023-07-16 DIAGNOSIS — Z79899 Other long term (current) drug therapy: Secondary | ICD-10-CM

## 2023-07-16 DIAGNOSIS — L2089 Other atopic dermatitis: Secondary | ICD-10-CM

## 2023-07-16 MED ORDER — DUPIXENT 300 MG/2ML ~~LOC~~ SOAJ
300.0000 mg | SUBCUTANEOUS | 5 refills | Status: DC
Start: 2023-07-16 — End: 2023-08-12
  Filled 2023-07-17 – 2023-08-05 (×3): qty 4, 28d supply, fill #0

## 2023-07-16 NOTE — Progress Notes (Signed)
   Follow Up Visit   Subjective  Jamie [name] is a 52 y.o. female who presents for the following: Hand/foot dermatitis 6 month follow-up. Patient is controlled on Dupixent injections every 2 weeks. No injection site reactions and no eye irritation. She also uses tacrolimus ointment and clobetasol cream as needed. She feels like she is doing well.  The following portions of the chart were reviewed this encounter and updated as appropriate: medications, allergies, medical history.  Review of Systems:  No other skin or systemic complaints except as noted in HPI or Assessment and Plan.  Objective  Well appearing patient in no apparent distress; mood and affect are within normal limits.  A focused examination was performed of the following areas: hands  Relevant exam findings are noted in the Assessment and Plan.    Assessment & Plan   Hand dermatitis  Related Medications Dupilumab (DUPIXENT) 300 MG/2ML SOAJ Inject 300 mg into the skin every 14 (fourteen) days.    HAND/FOOT DERMATITIS on Systemic Treatment Exam: Xerosis of the bilateral palms. <1% BSA  Chronic condition with duration or expected duration over one year. Currently well-controlled on Dupixent   Atopic dermatitis - Severe, on Dupixent (biologic medication).  Atopic dermatitis (eczema) is a chronic, relapsing, pruritic condition that can significantly affect quality of life. It is often associated with allergic rhinitis and/or asthma and can require treatment with topical medications, phototherapy, or in severe cases biologic medications, which require long term medication management.    Treatment Plan: Continue Dupixent sq injections q 2 wks Continue Tacrolimus 0.1% ointment to at bedtime prn When start to flare recommend starting Clobetasol cr qd until clear, then prn flares, avoid face, groin, axilla  Recommend gentle skin care.  Long term medication management.  Patient is using long term (months to years)  prescription medication  to control their dermatologic condition.  These medications require periodic monitoring to evaluate for efficacy and side effects and may require periodic laboratory monitoring.   Return in about 6 months (around 01/13/2024) for Hand/Foot dermatitis, on Dupixent.  ICherlyn Labella, CMA, am acting as scribe for Willeen Niece, MD .   Documentation: I have reviewed the above documentation for accuracy and completeness, and I agree with the above.  Willeen Niece, MD

## 2023-07-16 NOTE — Patient Instructions (Addendum)
Dupilumab (Dupixent) is a treatment given by injection for adults and children with moderate-to-severe atopic dermatitis. Goal is control of skin condition, not cure. It is given as 2 injections at the first dose followed by 1 injection ever 2 weeks thereafter.  Young children are dosed monthly.  Potential side effects include allergic reaction, herpes infections, injection site reactions and conjunctivitis (inflammation of the eyes).  The use of Dupixent requires long term medication management, including periodic office visits.  Topical steroids (such as triamcinolone, fluocinolone, fluocinonide, mometasone, clobetasol, halobetasol, betamethasone, hydrocortisone) can cause thinning and lightening of the skin if they are used for too long in the same area. Your physician has selected the right strength medicine for your problem and area affected on the body. Please use your medication only as directed by your physician to prevent side effects.    Due to recent changes in healthcare laws, you may see results of your pathology and/or laboratory studies on MyChart before the doctors have had a chance to review them. We understand that in some cases there may be results that are confusing or concerning to you. Please understand that not all results are received at the same time and often the doctors may need to interpret multiple results in order to provide you with the best plan of care or course of treatment. Therefore, we ask that you please give [place] 2 business days to thoroughly review all your results before contacting the office for clarification. Should we see a critical lab result, you will be contacted sooner.   If You Need Anything After Your Visit  If you have any questions or concerns for your doctor, please call our main line at [phone] and press option 4 to reach your doctor's medical assistant. If no one answers, please leave a voicemail as directed and we will return your call as soon as  possible. Messages left after 4 pm will be answered the following business day.   You may also send [place] a message via MyChart. We typically respond to MyChart messages within 1-2 business days.  For prescription refills, please ask your pharmacy to contact our office. Our fax number is [phone].  If you have an urgent issue when the clinic is closed that cannot wait until the next business day, you can page your doctor at the number below.    Please note that while we do our best to be available for urgent issues outside of office hours, we are not available 24/7.   If you have an urgent issue and are unable to reach [place], you may choose to seek medical care at your doctor's office, retail clinic, urgent care center, or emergency room.  If you have a medical emergency, please immediately call 911 or go to the emergency department.  Pager Numbers  - Dr. Gwen Pounds: [phone]  - Dr. Roseanne Reno: [phone]  - Dr. Katrinka Blazing: [phone]   In the event of inclement weather, please call our main line at [phone] for an update on the status of any delays or closures.  Dermatology Medication Tips: Please keep the boxes that topical medications come in in order to help keep track of the instructions about where and how to use these. Pharmacies typically print the medication instructions only on the boxes and not directly on the medication tubes.   If your medication is too expensive, please contact our office at [phone] option 4 or send [place] a message through MyChart.   We are unable to tell what your co-pay  for medications will be in advance as this is different depending on your insurance coverage. However, we may be able to find a substitute medication at lower cost or fill out paperwork to get insurance to cover a needed medication.   If a prior authorization is required to get your medication covered by your insurance company, please allow [place] 1-2 business days to complete this  process.  Drug prices often vary depending on where the prescription is filled and some pharmacies may offer cheaper prices.  The website www.goodrx.com contains coupons for medications through different pharmacies. The prices here do not account for what the cost may be with help from insurance (it may be cheaper with your insurance), but the website can give you the price if you did not use any insurance.  - You can print the associated coupon and take it with your prescription to the pharmacy.  - You may also stop by our office during regular business hours and pick up a GoodRx coupon card.  - If you need your prescription sent electronically to a different pharmacy, notify our office through [facility] or by phone at [phone] option 4.     Si Usted Necesita Algo Despus de Su Visita  Tambin puede enviarnos un mensaje a travs de Clinical cytogeneticist. Por lo general respondemos a los mensajes de MyChart en el transcurso de 1 a 2 das hbiles.  Para renovar recetas, por favor pida a su farmacia que se ponga en contacto con nuestra oficina. Annie Sable de fax es [place] [phone].  Si tiene un asunto urgente cuando la clnica est cerrada y que no puede esperar hasta el siguiente da hbil, puede llamar/localizar a su doctor(a) al nmero que aparece a continuacin.   Por favor, tenga en cuenta que aunque hacemos todo lo posible para estar disponibles para asuntos urgentes fuera del horario de [place], no estamos disponibles las 24 horas del da, los 7 [address]  [address] de la [place].   Si tiene un problema urgente y no puede comunicarse con nosotros, puede optar por buscar atencin mdica  en el consultorio de su doctor(a), en una clnica privada, en un centro de atencin urgente o en una sala de emergencias.  Si tiene Engineer, drilling, por favor llame inmediatamente al 911 o vaya a la sala de emergencias.  Nmeros de bper  - Dr. Gwen Pounds: [phone]  - Dra. Roseanne Reno: [phone]  - Dr.  Katrinka Blazing: [phone]   En caso de inclemencias del tiempo, por favor llame a Lacy Duverney principal al [phone] para una actualizacin sobre el [place] de cualquier retraso o cierre.  Consejos para la medicacin en dermatologa: Por favor, guarde las cajas en las que vienen los medicamentos de uso tpico para ayudarle a seguir las instrucciones sobre dnde y cmo usarlos. Las farmacias generalmente imprimen las instrucciones del medicamento slo en las cajas y no directamente en los tubos del [place].   Si su medicamento es muy caro, por favor, pngase en contacto con Rolm Gala llamando al [phone] y presione la opcin 4 o envenos un mensaje a travs de Clinical cytogeneticist.   No podemos decirle cul ser su copago por los medicamentos por adelantado ya que esto es diferente dependiendo de la cobertura de su seguro. Sin embargo, es posible que podamos encontrar un medicamento sustituto a Audiological scientist un formulario para que el seguro cubra el medicamento que se considera necesario.   Si se requiere una autorizacin previa para que su compaa de seguros [place] su medicamento, por  favor permtanos de 1 a 2 das hbiles para completar [address]  Los precios de los medicamentos varan con frecuencia dependiendo del Environmental consultant de dnde se surte la receta y alguna farmacias pueden ofrecer precios ms baratos.  El sitio web www.goodrx.com tiene cupones para medicamentos de Health and safety inspector. Los precios aqu no tienen en cuenta lo que podra costar con la ayuda del seguro (puede ser ms barato con su seguro), pero el sitio web puede darle el precio si no utiliz Tourist information centre manager.  - Puede imprimir el cupn correspondiente y llevarlo con su receta a la farmacia.  - Tambin puede pasar por nuestra oficina durante el horario de atencin regular y Education officer, museum una tarjeta de cupones de GoodRx.  - Si necesita que su receta se enve electrnicamente a una farmacia diferente, informe a nuestra oficina a  travs de MyChart de [place] o por telfono llamando al [phone] y presione la opcin 4.

## 2023-07-17 ENCOUNTER — Other Ambulatory Visit: Payer: Self-pay

## 2023-07-17 DIAGNOSIS — Z79899 Other long term (current) drug therapy: Secondary | ICD-10-CM | POA: Diagnosis not present

## 2023-07-17 MED ORDER — OXYCODONE HCL 10 MG PO TABS
10.0000 mg | ORAL_TABLET | Freq: Four times a day (QID) | ORAL | 0 refills | Status: DC
  Filled 2023-07-22: qty 120, 30d supply, fill #0

## 2023-07-17 MED ORDER — MORPHINE SULFATE ER 30 MG PO TBCR
30.0000 mg | EXTENDED_RELEASE_TABLET | Freq: Every day | ORAL | 0 refills | Status: DC
  Filled 2023-08-12: qty 30, 30d supply, fill #0

## 2023-07-22 ENCOUNTER — Other Ambulatory Visit: Payer: Self-pay

## 2023-07-24 ENCOUNTER — Other Ambulatory Visit: Payer: Self-pay

## 2023-08-05 ENCOUNTER — Other Ambulatory Visit: Payer: Self-pay

## 2023-08-05 ENCOUNTER — Encounter (HOSPITAL_COMMUNITY): Payer: Self-pay

## 2023-08-05 NOTE — Progress Notes (Signed)
Specialty Pharmacy Refill Coordination Note  Jamie [name] is a 52 y.o. female contacted today regarding refills of specialty medication(s) Dupilumab (Dupixent)   Patient requested Delivery   Delivery date: 08/09/23   Verified address: [address] [name], [address]   Medication will be filled on 08/08/23.

## 2023-08-05 NOTE — Progress Notes (Signed)
Specialty Pharmacy Ongoing Clinical Assessment Note  Jamie [name] is a 52 y.o. female who is being followed by the specialty pharmacy service for RxSp Atopic Dermatitis   Patient's specialty medication(s) reviewed today: Dupilumab (Dupixent)   Missed doses in the last 4 weeks: 0   Patient/Caregiver did not have any additional questions or concerns.   Therapeutic benefit summary: Patient is achieving benefit   Adverse events/side effects summary: No adverse events/side effects   Patient's therapy is appropriate to: Continue    Goals Addressed             This Visit's Progress    Reduce signs and symptoms       Patient is on track. Patient will maintain adherence         Follow up:  6 months  Otto Herb Specialty Pharmacist

## 2023-08-06 ENCOUNTER — Ambulatory Visit: Payer: 59 | Admitting: Dermatology

## 2023-08-06 ENCOUNTER — Encounter: Payer: Self-pay | Admitting: Dermatology

## 2023-08-06 DIAGNOSIS — L820 Inflamed seborrheic keratosis: Secondary | ICD-10-CM | POA: Diagnosis not present

## 2023-08-06 DIAGNOSIS — L821 Other seborrheic keratosis: Secondary | ICD-10-CM | POA: Diagnosis not present

## 2023-08-06 DIAGNOSIS — W908XXA Exposure to other nonionizing radiation, initial encounter: Secondary | ICD-10-CM | POA: Diagnosis not present

## 2023-08-06 DIAGNOSIS — L814 Other melanin hyperpigmentation: Secondary | ICD-10-CM

## 2023-08-06 DIAGNOSIS — L309 Dermatitis, unspecified: Secondary | ICD-10-CM

## 2023-08-06 DIAGNOSIS — D229 Melanocytic nevi, unspecified: Secondary | ICD-10-CM

## 2023-08-06 DIAGNOSIS — Z1283 Encounter for screening for malignant neoplasm of skin: Secondary | ICD-10-CM

## 2023-08-06 DIAGNOSIS — L578 Other skin changes due to chronic exposure to nonionizing radiation: Secondary | ICD-10-CM | POA: Diagnosis not present

## 2023-08-06 DIAGNOSIS — L853 Xerosis cutis: Secondary | ICD-10-CM | POA: Diagnosis not present

## 2023-08-06 DIAGNOSIS — Z79899 Other long term (current) drug therapy: Secondary | ICD-10-CM

## 2023-08-06 DIAGNOSIS — B351 Tinea unguium: Secondary | ICD-10-CM | POA: Diagnosis not present

## 2023-08-06 DIAGNOSIS — D1801 Hemangioma of skin and subcutaneous tissue: Secondary | ICD-10-CM

## 2023-08-06 DIAGNOSIS — Z86018 Personal history of other benign neoplasm: Secondary | ICD-10-CM

## 2023-08-06 NOTE — Patient Instructions (Signed)
 Cryotherapy Aftercare  Wash gently with soap and water everyday.   Apply Vaseline Jelly daily until healed.    Recommend daily broad spectrum sunscreen SPF 30+ to sun-exposed areas, reapply every 2 hours as needed. Call for new or changing lesions.  Staying in the shade or wearing long sleeves, sun glasses (UVA+UVB protection) and wide brim hats (4-inch brim around the entire circumference of the hat) are also recommended for sun protection.    Melanoma ABCDEs  Melanoma is the most dangerous type of skin cancer, and is the leading cause of death from skin disease.  You are more likely to develop melanoma if you: Have light-colored skin, light-colored eyes, or red or blond hair Spend a lot of time in the sun Tan regularly, either outdoors or in a tanning bed Have had blistering sunburns, especially during childhood Have a close family member who has had a melanoma Have atypical moles or large birthmarks  Early detection of melanoma is key since treatment is typically straightforward and cure rates are extremely high if we catch it early.   The first sign of melanoma is often a change in a mole or a new dark spot.  The ABCDE system is a way of remembering the signs of melanoma.  A for asymmetry:  The two halves do not match. B for border:  The edges of the growth are irregular. C for color:  A mixture of colors are present instead of an even brown color. D for diameter:  Melanomas are usually (but not always) greater than 6mm - the size of a pencil eraser. E for evolution:  The spot keeps changing in size, shape, and color.  Please check your skin once per month between visits. You can use a small mirror in front and a large mirror behind you to keep an eye on the back side or your body.   If you see any new or changing lesions before your next follow-up, please call to schedule a visit.  Please continue daily skin protection including broad spectrum sunscreen SPF 30+ to sun-exposed  areas, reapplying every 2 hours as needed when you're outdoors.   Staying in the shade or wearing long sleeves, sun glasses (UVA+UVB protection) and wide brim hats (4-inch brim around the entire circumference of the hat) are also recommended for sun protection.    Due to recent changes in healthcare laws, you may see results of your pathology and/or laboratory studies on MyChart before the doctors have had a chance to review them. We understand that in some cases there may be results that are confusing or concerning to you. Please understand that not all results are received at the same time and often the doctors may need to interpret multiple results in order to provide you with the best plan of care or course of treatment. Therefore, we ask that you please give [place] 2 business days to thoroughly review all your results before contacting the office for clarification. Should we see a critical lab result, you will be contacted sooner.   If You Need Anything After Your Visit  If you have any questions or concerns for your doctor, please call our main line at [phone] and press option 4 to reach your doctor's medical assistant. If no one answers, please leave a voicemail as directed and we will return your call as soon as possible. Messages left after 4 pm will be answered the following business day.   You may also send [place] a message via MyChart.  We typically respond to MyChart messages within 1-2 business days.  For prescription refills, please ask your pharmacy to contact our office. Our fax number is [phone].  If you have an urgent issue when the clinic is closed that cannot wait until the next business day, you can page your doctor at the number below.    Please note that while we do our best to be available for urgent issues outside of office hours, we are not available 24/7.   If you have an urgent issue and are unable to reach [place], you may choose to seek medical care at your doctor's  office, retail clinic, urgent care center, or emergency room.  If you have a medical emergency, please immediately call 911 or go to the emergency department.  Pager Numbers  - Dr. Gwen Pounds: [phone]  - Dr. Roseanne Reno: [phone]  - Dr. Katrinka Blazing: [phone]   In the event of inclement weather, please call our main line at [phone] for an update on the status of any delays or closures.  Dermatology Medication Tips: Please keep the boxes that topical medications come in in order to help keep track of the instructions about where and how to use these. Pharmacies typically print the medication instructions only on the boxes and not directly on the medication tubes.   If your medication is too expensive, please contact our office at [phone] option 4 or send [place] a message through MyChart.   We are unable to tell what your co-pay for medications will be in advance as this is different depending on your insurance coverage. However, we may be able to find a substitute medication at lower cost or fill out paperwork to get insurance to cover a needed medication.   If a prior authorization is required to get your medication covered by your insurance company, please allow [place] 1-2 business days to complete this process.  Drug prices often vary depending on where the prescription is filled and some pharmacies may offer cheaper prices.  The website www.goodrx.com contains coupons for medications through different pharmacies. The prices here do not account for what the cost may be with help from insurance (it may be cheaper with your insurance), but the website can give you the price if you did not use any insurance.  - You can print the associated coupon and take it with your prescription to the pharmacy.  - You may also stop by our office during regular business hours and pick up a GoodRx coupon card.  - If you need your prescription sent electronically to a different pharmacy, notify our  office through [facility] or by phone at [phone] option 4.     Si Usted Necesita Algo Despus de Su Visita  Tambin puede enviarnos un mensaje a travs de Clinical cytogeneticist. Por lo general respondemos a los mensajes de MyChart en el transcurso de 1 a 2 das hbiles.  Para renovar recetas, por favor pida a su farmacia que se ponga en contacto con nuestra oficina. Annie Sable de fax es [place] [phone].  Si tiene un asunto urgente cuando la clnica est cerrada y que no puede esperar hasta el siguiente da hbil, puede llamar/localizar a su doctor(a) al nmero que aparece a continuacin.   Por favor, tenga en cuenta que aunque hacemos todo lo posible para estar disponibles para asuntos urgentes fuera del horario de [place], no estamos disponibles las 24 horas del da, los 7 [address]  [address] de la [place].   Si tiene un problema urgente y no  puede comunicarse con nosotros, puede optar por buscar atencin mdica  en el consultorio de su doctor(a), en una clnica privada, en un centro de atencin urgente o en una sala de emergencias.  Si tiene Engineer, drilling, por favor llame inmediatamente al 911 o vaya a la sala de emergencias.  Nmeros de bper  - Dr. Gwen Pounds: [phone]  - Dra. Roseanne Reno: [phone]  - Dr. Katrinka Blazing: [phone]   En caso de inclemencias del tiempo, por favor llame a Lacy Duverney principal al [phone] para una actualizacin sobre el [place] de cualquier retraso o cierre.  Consejos para la medicacin en dermatologa: Por favor, guarde las cajas en las que vienen los medicamentos de uso tpico para ayudarle a seguir las instrucciones sobre dnde y cmo usarlos. Las farmacias generalmente imprimen las instrucciones del medicamento slo en las cajas y no directamente en los tubos del [place].   Si su medicamento es muy caro, por favor, pngase en contacto con Rolm Gala llamando al [phone] y presione la opcin 4 o envenos un mensaje a travs de Clinical cytogeneticist.    No podemos decirle cul ser su copago por los medicamentos por adelantado ya que esto es diferente dependiendo de la cobertura de su seguro. Sin embargo, es posible que podamos encontrar un medicamento sustituto a Audiological scientist un formulario para que el seguro cubra el medicamento que se considera necesario.   Si se requiere una autorizacin previa para que su compaa de seguros [place] su medicamento, por favor permtanos de 1 a 2 das hbiles para completar [address]  Los precios de los medicamentos varan con frecuencia dependiendo del Environmental consultant de dnde se surte la receta y alguna farmacias pueden ofrecer precios ms baratos.  El sitio web www.goodrx.com tiene cupones para medicamentos de Health and safety inspector. Los precios aqu no tienen en cuenta lo que podra costar con la ayuda del seguro (puede ser ms barato con su seguro), pero el sitio web puede darle el precio si no utiliz Tourist information centre manager.  - Puede imprimir el cupn correspondiente y llevarlo con su receta a la farmacia.  - Tambin puede pasar por nuestra oficina durante el horario de atencin regular y Education officer, museum una tarjeta de cupones de GoodRx.  - Si necesita que su receta se enve electrnicamente a una farmacia diferente, informe a nuestra oficina a travs de MyChart de [place] o por telfono llamando al [phone] y presione la opcin 4.

## 2023-08-06 NOTE — Progress Notes (Signed)
Follow-Up Visit   Subjective  Jamie [name] is a 52 y.o. female who presents for the following: Skin Cancer Screening and Full Body Skin Exam. Hx of dysplastic nevus.  Check spots at right lower eyelid and left lateral lower eyelid that she rubs and gets irritated. Pt continues to have abnormal fingernails and toenail.  Has tried weekly fluconazole and topical Kerydin without improvement.  The patient presents for Total-Body Skin Exam (TBSE) for skin cancer screening and mole check. The patient has spots, moles and lesions to be evaluated, some may be new or changing and the patient may have concern these could be cancer.    The following portions of the chart were reviewed this encounter and updated as appropriate: medications, allergies, medical history  Review of Systems:  No other skin or systemic complaints except as noted in HPI or Assessment and Plan.  Objective  Well appearing patient in no apparent distress; mood and affect are within normal limits.  A full examination was performed including scalp, head, eyes, ears, nose, lips, neck, chest, axillae, abdomen, back, buttocks, bilateral upper extremities, bilateral lower extremities, hands, feet, fingers, toes, fingernails, and toenails. All findings within normal limits unless otherwise noted below.   Relevant physical exam findings are noted in the Assessment and Plan.  Right infraocular x1, L lateral canthus x1, L inframammary x4 (6) Erythematous keratotic or waxy stuck-on papule  Assessment & Plan   HISTORY OF DYSPLASTIC NEVUS. Neck - posterior, Severe Atypia. Shave removal 07/18/2020, margins free   No evidence of recurrence today Recommend regular full body skin exams Recommend daily broad spectrum sunscreen SPF 30+ to sun-exposed areas, reapply every 2 hours as needed.  Call if any new or changing lesions are noted between office visits   SKIN CANCER SCREENING PERFORMED TODAY.  ACTINIC DAMAGE. Chest  - Chronic  condition, secondary to cumulative UV/sun exposure - diffuse scaly erythematous macules with underlying dyspigmentation - Recommend daily broad spectrum sunscreen SPF 30+ to sun-exposed areas, reapply every 2 hours as needed.  - Staying in the shade or wearing long sleeves, sun glasses (UVA+UVB protection) and wide brim hats (4-inch brim around the entire circumference of the hat) are also recommended for sun protection.  - Call for new or changing lesions.  LENTIGINES, SEBORRHEIC KERATOSES, HEMANGIOMAS - Benign normal skin lesions. Chest, back, face. - Benign-appearing - Call for any changes  MELANOCYTIC NEVI - Tan-brown and/or pink-flesh-colored symmetric macules and papules - Benign appearing on exam today - Observation - Call clinic for new or changing moles - Recommend daily use of broad spectrum spf 30+ sunscreen to sun-exposed areas.   HAND/FOOT DERMATITIS on Systemic Treatment Exam: Xerosis of the bilateral palms. <1% BSA   Chronic condition with duration or expected duration over one year. Currently well-controlled on Dupixent    Atopic dermatitis - Severe, on Dupixent (biologic medication).  Atopic dermatitis (eczema) is a chronic, relapsing, pruritic condition that can significantly affect quality of life. It is often associated with allergic rhinitis and/or asthma and can require treatment with topical medications, phototherapy, or in severe cases biologic medications, which require long term medication management.     Treatment Plan: Continue Dupixent sq injections q 2 wks Continue Tacrolimus 0.1% ointment to at bedtime prn When start to flare recommend starting Clobetasol cr qd until clear, then prn flares, avoid face, groin, axilla   Recommend gentle skin care.   Long term medication management.  Patient is using long term (months to years) prescription medication  to  control their dermatologic condition.  These medications require periodic monitoring to evaluate for  efficacy and side effects and may require periodic laboratory monitoring.   Xerosis - diffuse xerotic patches - recommend gentle, hydrating skin care - gentle skin care handout given  ONYCHOMYCOSIS Exam: Thickened toenails with subungal debris c/w onychomycosis L-4,5 with discoloration, L great toenail  Chronic and persistent condition with duration or expected duration over one year. Condition is symptomatic/ bothersome to patient. Not currently at goal.  Treatment Plan: Plan treatment pending PCR test results. Pt will RTC for photos and treatment if PCR positive   Nail clippings/curetting  of L-4 fingernail and L great toenail sent [employer] for fungal PCR testing INFLAMED SEBORRHEIC KERATOSIS (6) Right infraocular x1, L lateral canthus x1, L inframammary x4 (6) Symptomatic, irritating, patient would like treated. Destruction of lesion - Right infraocular x1, L lateral canthus x1, L inframammary x4 (6)  Destruction method: cryotherapy   Informed consent: discussed and consent obtained   Lesion destroyed using liquid nitrogen: Yes   Region frozen until ice ball extended beyond lesion: Yes   Outcome: patient tolerated procedure well with no complications   Post-procedure details: wound care instructions given   Additional details:  Prior to procedure, discussed risks of blister formation, small wound, skin dyspigmentation, or rare scar following cryotherapy. Recommend Vaseline ointment to treated areas while healing.  Return in about 1 year (around 08/05/2024) for TBSE, Follow up with Dr. Roseanne Reno as scheduled .  I, Lawson Radar, CMA, am acting as scribe for Willeen Niece, MD.   Documentation: I have reviewed the above documentation for accuracy and completeness, and I agree with the above.  Willeen Niece, MD

## 2023-08-07 ENCOUNTER — Other Ambulatory Visit: Payer: Self-pay

## 2023-08-07 ENCOUNTER — Telehealth (HOSPITAL_COMMUNITY): Payer: Self-pay | Admitting: Psychiatry

## 2023-08-07 ENCOUNTER — Telehealth (HOSPITAL_COMMUNITY): Payer: 59 | Admitting: Psychiatry

## 2023-08-07 ENCOUNTER — Encounter (HOSPITAL_COMMUNITY): Payer: Self-pay | Admitting: Psychiatry

## 2023-08-07 VITALS — Wt 173.0 lb

## 2023-08-07 DIAGNOSIS — F410 Panic disorder [episodic paroxysmal anxiety] without agoraphobia: Secondary | ICD-10-CM | POA: Diagnosis not present

## 2023-08-07 DIAGNOSIS — F3342 Major depressive disorder, recurrent, in full remission: Secondary | ICD-10-CM

## 2023-08-07 DIAGNOSIS — F411 Generalized anxiety disorder: Secondary | ICD-10-CM | POA: Diagnosis not present

## 2023-08-07 MED ORDER — PAROXETINE HCL 40 MG PO TABS
40.0000 mg | ORAL_TABLET | Freq: Every day | ORAL | 0 refills | Status: DC
  Filled 2023-08-07: qty 90, 90d supply, fill #0

## 2023-08-07 MED ORDER — LAMOTRIGINE 25 MG PO TABS
200.0000 mg | ORAL_TABLET | Freq: Two times a day (BID) | ORAL | 0 refills | Status: DC
  Filled 2023-08-07: qty 60, 4d supply, fill #0

## 2023-08-07 MED ORDER — LAMOTRIGINE 200 MG PO TABS
200.0000 mg | ORAL_TABLET | Freq: Every day | ORAL | 0 refills | Status: DC
  Filled 2023-08-07: qty 90, 90d supply, fill #0

## 2023-08-07 MED ORDER — ARIPIPRAZOLE 5 MG PO TABS
5.0000 mg | ORAL_TABLET | Freq: Every day | ORAL | 0 refills | Status: DC
  Filled 2023-08-07: qty 90, 90d supply, fill #0
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
## 2023-08-08 ENCOUNTER — Other Ambulatory Visit: Payer: Self-pay

## 2023-08-12 ENCOUNTER — Telehealth: Payer: Self-pay

## 2023-08-12 ENCOUNTER — Other Ambulatory Visit: Payer: Self-pay

## 2023-08-12 ENCOUNTER — Other Ambulatory Visit: Payer: Self-pay | Admitting: Pharmacist

## 2023-08-12 ENCOUNTER — Other Ambulatory Visit (HOSPITAL_COMMUNITY): Payer: Self-pay

## 2023-08-12 DIAGNOSIS — Z79899 Other long term (current) drug therapy: Secondary | ICD-10-CM | POA: Diagnosis not present

## 2023-08-12 DIAGNOSIS — L309 Dermatitis, unspecified: Secondary | ICD-10-CM

## 2023-08-12 MED ORDER — OXYCODONE HCL 10 MG PO TABS
10.0000 mg | ORAL_TABLET | Freq: Four times a day (QID) | ORAL | 0 refills | Status: DC
  Filled 2023-08-20: qty 120, 30d supply, fill #0

## 2023-08-12 MED ORDER — DUPIXENT 300 MG/2ML ~~LOC~~ SOAJ
300.0000 mg | SUBCUTANEOUS | 5 refills | Status: DC
  Filled 2023-08-12 – 2023-09-10 (×3): qty 4, 28d supply, fill #0
  Filled 2023-10-01: qty 4, 28d supply, fill #1
  Filled 2023-11-05: qty 4, 28d supply, fill #2
  Filled 2023-12-03 (×2): qty 4, 28d supply, fill #3
  Filled 2024-01-07: qty 4, 28d supply, fill #4
  Filled 2024-01-29: qty 4, 28d supply, fill #5

## 2023-08-12 MED ORDER — MORPHINE SULFATE ER 30 MG PO TBCR
30.0000 mg | EXTENDED_RELEASE_TABLET | Freq: Every day | ORAL | 0 refills | Status: DC
  Filled 2023-09-12: qty 30, 30d supply, fill #0

## 2023-08-12 NOTE — Telephone Encounter (Signed)
Patient advised and appt scheduled. aw

## 2023-08-12 NOTE — Telephone Encounter (Signed)
[facility] lab results are back and in media. aw

## 2023-08-16 ENCOUNTER — Ambulatory Visit: Payer: Self-pay

## 2023-08-16 NOTE — Telephone Encounter (Signed)
  Chief Complaint: URI Symptoms: cough with congestion, earache, SOB with wheezing at times, nasal congestion, sinus pressure Frequency: Tuesday Pertinent Negatives: Patient denies fever Disposition: [] ED /[] Urgent Care (no appt availability in office) / [] Appointment(In office/virtual)/ [x]  [facility]/ [] Home Care/ [] Refused Recommended Disposition /[] [place] [facility]/ []  Follow-up with PCP Additional Notes: pt has been taking Daytime Cold and Sinus but no relief. Believes she has an inhaler from last time sick but hasn't had to use it. No appts with practice until 08/20/23. Scheduled pt for VUC tomorrow at 0915 d/t first available. Care advice given and pt verbalized understanding.   Reason for Disposition  Earache  Answer Assessment - Initial Assessment Questions 1. LOCATION: "Where does it hurt?"      Sinus pressure and pain  2. ONSET: "When did the sinus pain start?"  (e.g., hours, days)      Tuesday  3. SEVERITY: "How bad is the pain?"   (Scale 1-10; mild, moderate or severe)   - MILD (1-3): doesn't interfere with normal activities    - MODERATE (4-7): interferes with normal activities (e.g., work or school) or awakens from sleep   - SEVERE (8-10): excruciating pain and patient unable to do any normal activities        Moderate  5. NASAL CONGESTION: "Is the nose blocked?" If Yes, ask: "Can you open it or must you breathe through your mouth?"     yes 7. FEVER: "Do you have a fever?" If Yes, ask: "What is it, how was it measured, and when did it start?"      no 8. OTHER SYMPTOMS: "Do you have any other symptoms?" (e.g., sore throat, cough, earache, difficulty breathing)     Cough with congestion, earache SOB with Wheezing  Protocols used: Sinus Pain or Congestion-A-AH

## 2023-08-17 ENCOUNTER — Encounter: Payer: Self-pay | Admitting: Nurse Practitioner

## 2023-08-17 ENCOUNTER — Telehealth: Payer: 59 | Admitting: Nurse Practitioner

## 2023-08-17 DIAGNOSIS — J069 Acute upper respiratory infection, unspecified: Secondary | ICD-10-CM | POA: Diagnosis not present

## 2023-08-17 MED ORDER — AZITHROMYCIN 250 MG PO TABS: ORAL_TABLET | ORAL | 0 refills | Status: AC

## 2023-08-17 MED ORDER — PREDNISONE 20 MG PO TABS: 20.0000 mg | ORAL_TABLET | Freq: Every day | ORAL | 0 refills | Status: AC

## 2023-08-17 MED ORDER — FLUTICASONE PROPIONATE 50 MCG/ACT NA SUSP: 1.0000 | Freq: Every day | NASAL | 0 refills | Status: DC | PRN

## 2023-08-17 NOTE — Patient Instructions (Signed)
Jamie [name], thank you for joining Jamie Rigg, NP for today's virtual visit.  While this provider is not your primary care provider (PCP), if your PCP is located in our provider database this encounter information will be shared with them immediately following your visit.   A Jamie [name] MyChart account gives you access to today's visit and all your visits, tests, and labs performed at [facility] " click here if you don't have a [facility] MyChart account or go to [email or web address]  Consent: (Patient) Jamie [name] provided verbal consent for this virtual visit at the beginning of the encounter.  Current Medications:  Current Outpatient Medications:    azithromycin (ZITHROMAX) 250 MG tablet, Take 2 tablets on day 1, then 1 tablet daily on days 2 through 5, Disp: 6 tablet, Rfl: 0   predniSONE (DELTASONE) 20 MG tablet, Take 1 tablet (20 mg total) by mouth daily with breakfast for 5 days., Disp: 5 tablet, Rfl: 0   ARIPiprazole (ABILIFY) 5 MG tablet, Take 1 tablet (5 mg total) by mouth daily., Disp: 90 tablet, Rfl: 0   celecoxib (CELEBREX) 200 MG capsule, Take 1 capsule (200 mg total) by mouth daily., Disp: 30 capsule, Rfl: 1   cholecalciferol (VITAMIN D) 1000 UNITS tablet, Take 1,000 Units by mouth daily., Disp: , Rfl:    clobetasol cream (TEMOVATE) 0.05 %, Apply 1 Application topically as directed once daily to affected areas of  hands and feet as needed for flares, avoid face, groin, axilla., Disp: 45 g, Rfl: 3   Dupilumab (DUPIXENT) 300 MG/2ML SOAJ, Inject 300 mg into the skin every 14 (fourteen) days., Disp: 4 mL, Rfl: 5   fluticasone (FLONASE) 50 MCG/ACT nasal spray, Place 1 spray into both nostrils daily as needed for allergies., Disp: 16 g, Rfl: 0   gabapentin (NEURONTIN) 600 MG tablet, TAKE 1 TABLET BY MOUTH THREE TIMES DAILY, Disp: 90 tablet, Rfl: 11   Halobetasol Prop-Tazarotene (DUOBRII) 0.01-0.045 % LOTN, Apply 1 application topically at bedtime. qhs to aa  rash on hands and feet until clear, then prn flares, avoid face, groin, axilla (Patient taking differently: Apply 1 application  topically at bedtime as needed. qhs to aa rash on hands and feet until clear, then prn flares, avoid face, groin, axilla), Disp: 100 g, Rfl: 1   lamoTRIgine (LAMICTAL) 200 MG tablet, Take 1 tablet (200 mg total) by mouth daily., Disp: 90 tablet, Rfl: 0   lamoTRIgine (LAMICTAL) 25 MG tablet, Take 8 tablets (200 mg total) by mouth 2 (two) times daily., Disp: 60 tablet, Rfl: 0   LORazepam (ATIVAN) 0.5 MG tablet, Take 1 tablet (0.5 mg total) by mouth daily as needed for anxiety and do not take within 6 hours of your pain medicine., Disp: 10 tablet, Rfl: 0   methocarbamol (ROBAXIN) 500 MG tablet, Take 1 tablet (500 mg total) by mouth every 6 (six) hours as needed for muscle spasms., Disp: 120 tablet, Rfl: 0   montelukast (SINGULAIR) 10 MG tablet, Take 1 tablet (10 mg total) by mouth at bedtime., Disp: 90 tablet, Rfl: 3   morphine (MS CONTIN) 30 MG 12 hr tablet, Take 1 tablet (30 mg total) by mouth daily., Disp: 7 tablet, Rfl: 0   morphine (MS CONTIN) 30 MG 12 hr tablet, Take 1 tablet (30 mg total) by mouth daily., Disp: 30 tablet, Rfl: 0   morphine (MS CONTIN) 30 MG 12 hr tablet, Take 1 tablet (30 mg total) by mouth daily., Disp: 30 tablet, Rfl: 0   morphine (  MS CONTIN) 30 MG 12 hr tablet, Take 1 tablet (30 mg total) by mouth daily., Disp: 30 tablet, Rfl: 0   morphine (MS CONTIN) 30 MG 12 hr tablet, Take 1 tablet (30 mg total) by mouth daily., Disp: 30 tablet, Rfl: 0   morphine (MS CONTIN) 30 MG 12 hr tablet, Take 1 tablet (30 mg total) by mouth daily., Disp: 30 tablet, Rfl: 0   Multiple Vitamin (MULTIVITAMIN ADULT PO), Take by mouth daily., Disp: , Rfl:    Oxycodone HCl 10 MG TABS, 1 Tablet by mouth every six hours, Disp: 120 tablet, Rfl: 0   PARoxetine (PAXIL) 40 MG tablet, Take 1 tablet (40 mg total) by mouth daily., Disp: 90 tablet, Rfl: 0   tacrolimus (PROTOPIC) 0.1 %  ointment, Apply topically daily at bedtime to hands and feet (Patient taking differently: Apply 1 Application topically daily as needed.), Disp: 100 g, Rfl: 3   Tavaborole 5 % SOLN, Apply qhs to affected toenails, Disp: 10 mL, Rfl: 11   Medications ordered in this encounter:  Meds ordered this encounter  Medications   azithromycin (ZITHROMAX) 250 MG tablet    Sig: Take 2 tablets on day 1, then 1 tablet daily on days 2 through 5    Dispense:  6 tablet    Refill:  0    Supervising Provider:   Merrilee Jansky [[id number]]   fluticasone (FLONASE) 50 MCG/ACT nasal spray    Sig: Place 1 spray into both nostrils daily as needed for allergies.    Dispense:  16 g    Refill:  0    Supervising Provider:   Merrilee Jansky [[id number]]   predniSONE (DELTASONE) 20 MG tablet    Sig: Take 1 tablet (20 mg total) by mouth daily with breakfast for 5 days.    Dispense:  5 tablet    Refill:  0    Supervising Provider:   Merrilee Jansky [[id number]]     *If you need refills on other medications prior to your next appointment, please contact your pharmacy*  Follow-Up: Call back or seek an in-person evaluation if the symptoms worsen or if the condition fails to improve as anticipated.  [facility] [phone]  Other Instructions Recommend taking an over-the-counter COVID and flu test.  She was instructed that antibiotics do not treat viral infections. INSTRUCTIONS: use a humidifier for nasal congestion Drink plenty of fluids, rest and wash hands frequently to avoid the spread of infection Alternate tylenol and Motrin for relief of fever    If you have been instructed to have an in-person evaluation today at a local Urgent Care facility, please use the link below. It will take you to a list of all of our available [facility], including address, phone number and hours of operation. Please do not delay care.  [facility]  If you or a family member do not have a  primary care provider, use the link below to schedule a visit and establish care. When you choose a [facility] primary care physician or advanced practice provider, you gain a long-term partner in health. Find a Primary Care Provider  Learn more about [facility]'s in-office and virtual care options: [facility] - Get Care Now

## 2023-08-17 NOTE — Progress Notes (Signed)
Virtual Visit Consent   Jamie [name], you are scheduled for a virtual visit with a [facility] provider today. Just as with appointments in the office, your consent must be obtained to participate. Your consent will be active for this visit and any virtual visit you may have with one of our providers in the next 365 days. If you have a MyChart account, a copy of this consent can be sent to you electronically.  As this is a virtual visit, video technology does not allow for your provider to perform a traditional examination. This may limit your provider's ability to fully assess your condition. If your provider identifies any concerns that need to be evaluated in person or the need to arrange testing (such as labs, EKG, etc.), we will make arrangements to do so. Although advances in technology are sophisticated, we cannot ensure that it will always work on either your end or our end. If the connection with a video visit is poor, the visit may have to be switched to a telephone visit. With either a video or telephone visit, we are not always able to ensure that we have a secure connection.  By engaging in this virtual visit, you consent to the provision of healthcare and authorize for your insurance to be billed (if applicable) for the services provided during this visit. Depending on your insurance coverage, you may receive a charge related to this service.  I need to obtain your verbal consent now. Are you willing to proceed with your visit today? Jamie [name] has provided verbal consent on 08/17/2023 for a virtual visit (video or telephone). Jamie Rigg, NP  Date: 08/17/2023 9:22 AM  Virtual Visit via Video Note   I, Jamie [name], connected with  Jamie [name]  ([id number], 1971/02/23) on 08/17/23 at  9:15 AM EST by a video-enabled telemedicine application and verified that I am speaking with the correct person using two identifiers.  Location: Patient: Virtual Visit Location Patient:  Home Provider: Virtual Visit Location Provider: Home Office   I discussed the limitations of evaluation and management by telemedicine and the availability of in person appointments. The patient expressed understanding and agreed to proceed.    History of Present Illness: Jamie [name] is a 52 y.o. who identifies as a female who was assigned female at birth, and is being seen today for uri symptoms with cough and congestion.  MS. Colacino the following symptoms with 5 days onset: Productive cough with green sputum, sinus congestion and pressure, sore throat, headache, bilateral ear pain and pressure.  She is currently taking over-the-counter cold and flu medication with no relief.  She has not taken a COVID or flu test over-the-counter.  Denies fever, shortness of breath or chest pain.   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   Recurrent major depression-severe (HCC) 10/19/2020   GAD (generalized anxiety disorder) 04/26/2020   Panic attack 04/26/2020   At risk for long QT syndrome 04/26/2020   DDD (degenerative disc disease), lumbar 05/12/2019   Allergic rhinitis 05/11/2019   Primary osteoarthritis of right knee 03/20/2018   Insomnia 08/24/2017   Chronic back pain 10/16/2016   Pseudoarthrosis of lumbar spine 08/11/2015   Eczema 05/09/2015   Spinal stenosis of lumbar region 09/01/2014   Spondylolisthesis 08/03/2014   Displacement of lumbar intervertebral disc without myelopathy  08/03/2014    Allergies:  Allergies  Allergen Reactions   Amoxicillin Diarrhea, Nausea And Vomiting and Shortness Of Breath   Shellfish Allergy Hives   Flexeril [Cyclobenzaprine] Hives    RAPID HEARTBEAT   Wound Dressing Adhesive Hives and Itching   Medications:  Current Outpatient Medications:     azithromycin (ZITHROMAX) 250 MG tablet, Take 2 tablets on day 1, then 1 tablet daily on days 2 through 5, Disp: 6 tablet, Rfl: 0   predniSONE (DELTASONE) 20 MG tablet, Take 1 tablet (20 mg total) by mouth daily with breakfast for 5 days., Disp: 5 tablet, Rfl: 0   ARIPiprazole (ABILIFY) 5 MG tablet, Take 1 tablet (5 mg total) by mouth daily., Disp: 90 tablet, Rfl: 0   celecoxib (CELEBREX) 200 MG capsule, Take 1 capsule (200 mg total) by mouth daily., Disp: 30 capsule, Rfl: 1   cholecalciferol (VITAMIN D) 1000 UNITS tablet, Take 1,000 Units by mouth daily., Disp: , Rfl:    clobetasol cream (TEMOVATE) 0.05 %, Apply 1 Application topically as directed once daily to affected areas of  hands and feet as needed for flares, avoid face, groin, axilla., Disp: 45 g, Rfl: 3   Dupilumab (DUPIXENT) 300 MG/2ML SOAJ, Inject 300 mg into the skin every 14 (fourteen) days., Disp: 4 mL, Rfl: 5   fluticasone (FLONASE) 50 MCG/ACT nasal spray, Place 1 spray into both nostrils daily as needed for allergies., Disp: 16 g, Rfl: 0   gabapentin (NEURONTIN) 600 MG tablet, TAKE 1 TABLET BY MOUTH THREE TIMES DAILY, Disp: 90 tablet, Rfl: 11   Halobetasol Prop-Tazarotene (DUOBRII) 0.01-0.045 % LOTN, Apply 1 application topically at bedtime. qhs to aa rash on hands and feet until clear, then prn flares, avoid face, groin, axilla (Patient taking differently: Apply 1 application  topically at bedtime as needed. qhs to aa rash on hands and feet until clear, then prn flares, avoid face, groin, axilla), Disp: 100 g, Rfl: 1   lamoTRIgine (LAMICTAL) 200 MG tablet, Take 1 tablet (200 mg total) by mouth daily., Disp: 90 tablet, Rfl: 0   lamoTRIgine (LAMICTAL) 25 MG tablet, Take 8 tablets (200 mg total) by mouth 2 (two) times daily., Disp: 60 tablet, Rfl: 0   LORazepam (ATIVAN) 0.5 MG tablet, Take 1 tablet (0.5 mg total) by mouth daily as needed for anxiety and do not take within 6 hours of your pain medicine., Disp: 10 tablet, Rfl: 0    methocarbamol (ROBAXIN) 500 MG tablet, Take 1 tablet (500 mg total) by mouth every 6 (six) hours as needed for muscle spasms., Disp: 120 tablet, Rfl: 0   montelukast (SINGULAIR) 10 MG tablet, Take 1 tablet (10 mg total) by mouth at bedtime., Disp: 90 tablet, Rfl: 3   morphine (MS CONTIN) 30 MG 12 hr tablet, Take 1 tablet (30 mg total) by mouth daily., Disp: 7 tablet, Rfl: 0   morphine (MS CONTIN) 30 MG 12 hr tablet, Take 1 tablet (30 mg total) by mouth daily., Disp: 30 tablet, Rfl: 0   morphine (MS CONTIN) 30 MG 12 hr tablet, Take 1 tablet (30 mg total) by mouth daily., Disp: 30 tablet, Rfl: 0   morphine (MS CONTIN) 30 MG 12 hr tablet, Take 1 tablet (30 mg total) by mouth daily., Disp: 30 tablet, Rfl: 0   morphine (MS CONTIN) 30 MG 12 hr tablet, Take 1 tablet (30 mg total) by mouth daily., Disp: 30 tablet, Rfl: 0   morphine (MS CONTIN) 30 MG 12 hr tablet, Take 1 tablet (30  mg total) by mouth daily., Disp: 30 tablet, Rfl: 0   Multiple Vitamin (MULTIVITAMIN ADULT PO), Take by mouth daily., Disp: , Rfl:    Oxycodone HCl 10 MG TABS, 1 Tablet by mouth every six hours, Disp: 120 tablet, Rfl: 0   PARoxetine (PAXIL) 40 MG tablet, Take 1 tablet (40 mg total) by mouth daily., Disp: 90 tablet, Rfl: 0   tacrolimus (PROTOPIC) 0.1 % ointment, Apply topically daily at bedtime to hands and feet (Patient taking differently: Apply 1 Application topically daily as needed.), Disp: 100 g, Rfl: 3   Tavaborole 5 % SOLN, Apply qhs to affected toenails, Disp: 10 mL, Rfl: 11  Observations/Objective: Patient is well-developed, well-nourished in no acute distress.  Resting comfortably at home.  Head is normocephalic, atraumatic.  No labored breathing.  Speech is clear and coherent with logical content.  Patient is alert and oriented at baseline.    Assessment and Plan: 1. URI with cough and congestion (Primary) - azithromycin (ZITHROMAX) 250 MG tablet; Take 2 tablets on day 1, then 1 tablet daily on days 2 through 5   Dispense: 6 tablet; Refill: 0 - fluticasone (FLONASE) 50 MCG/ACT nasal spray; Place 1 spray into both nostrils daily as needed for allergies.  Dispense: 16 g; Refill: 0 - predniSONE (DELTASONE) 20 MG tablet; Take 1 tablet (20 mg total) by mouth daily with breakfast for 5 days.  Dispense: 5 tablet; Refill: 0  Recommend taking an over-the-counter COVID and flu test.  She was instructed that antibiotics do not treat viral infections. INSTRUCTIONS: use a humidifier for nasal congestion Drink plenty of fluids, rest and wash hands frequently to avoid the spread of infection Alternate tylenol and Motrin for relief of fever   Follow Up Instructions: I discussed the assessment and treatment plan with the patient. The patient was provided an opportunity to ask questions and all were answered. The patient agreed with the plan and demonstrated an understanding of the instructions.  A copy of instructions were sent to the patient via MyChart unless otherwise noted below.    The patient was advised to call back or seek an in-person evaluation if the symptoms worsen or if the condition fails to improve as anticipated.    Jamie Rigg, NP

## 2023-08-20 ENCOUNTER — Other Ambulatory Visit: Payer: Self-pay

## 2023-08-26 ENCOUNTER — Ambulatory Visit: Payer: 59 | Admitting: Dermatology

## 2023-08-26 DIAGNOSIS — B351 Tinea unguium: Secondary | ICD-10-CM | POA: Diagnosis not present

## 2023-08-26 MED ORDER — ITRACONAZOLE 100 MG PO CAPS
200.0000 mg | ORAL_CAPSULE | Freq: Two times a day (BID) | ORAL | 3 refills | Status: AC
  Filled 2023-08-26 – 2023-09-12 (×2): qty 28, 7d supply, fill #0
  Filled 2023-10-14: qty 28, 7d supply, fill #1
  Filled 2023-11-15: qty 28, 7d supply, fill #2

## 2023-08-26 NOTE — Progress Notes (Signed)
   Follow-Up Visit   Subjective  Jamie [name] is a 53 y.o. female who presents for the following: Nail follow-up and treatment. 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 She has tried weekly fluconazole  and topical Kerydin  without improvement. No history of CHF or heart or liver problems. Not taking statin medication.   The following portions of the chart were reviewed this encounter and updated as appropriate: medications, allergies, medical history  Review of Systems:  No other skin or systemic complaints except as noted in HPI or Assessment and Plan.  Objective  Well appearing patient in no apparent distress; mood and affect are within normal limits.  A focused examination was performed of the following areas: Face, fingernails, toenails Relevant physical exam findings are noted in the Assessment and Plan.       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 Condition is bothersome/symptomatic for patient. Currently flared.  ONYCHOMYCOSIS   Related Procedures Hepatic Function Panel Treatment Plan:  Start Itraconazole  100 MG Take 2 capsules by mouth twice daily x 1 week, off x 3 weeks for pulse dosing. Repeat every month for 3 additional months. (4 mos total).   Baseline LFTs, wnl 04/15/23. Will recheck in 2 mos after 2nd pulse dose, order given to patient.  Discussed potential drug interactions and black box for CHF, Take capsule with food and acidic beverage, Avoid PPI or H2 blockers, Avoid grapefruit juice, Contraindicated with statin medications.   Restart Kerydin  topical solution to affected nails at bedtime.   Return as scheduled.  IAndrea Kerns, CMA, am acting as scribe for Rexene Rattler, MD .   Documentation: I have reviewed the above documentation for accuracy and  completeness, and I agree with the above.  Rexene Rattler, MD

## 2023-08-26 NOTE — Patient Instructions (Addendum)
 Start Itraconazole  100 MG Take 2 capsules by mouth twice daily x 1 week, off x 3 weeks for pulse dosing. Repeat every month for 3 additional months. (4 mos total).   Obtain baseline LFTs, wnl 04/15/23. Will recheck labs in 2 mos after 2nd pulse dose, order given to patient.  Check drug interactions, Black box CHF, Take capsule with food and acidic beverage, Avoie PPI or H2 blockers, Avoid grapefruit juice, Contraindicated with statin medications.    Due to recent changes in healthcare laws, you may see results of your pathology and/or laboratory studies on MyChart before the doctors have had a chance to review them. We understand that in some cases there may be results that are confusing or concerning to you. Please understand that not all results are received at the same time and often the doctors may need to interpret multiple results in order to provide you with the best plan of care or course of treatment. Therefore, we ask that you please give us  2 business days to thoroughly review all your results before contacting the office for clarification. Should we see a critical lab result, you will be contacted sooner.   If You Need Anything After Your Visit  If you have any questions or concerns for your doctor, please call our main line at [phone] and press option 4 to reach your doctor's medical assistant. If no one answers, please leave a voicemail as directed and we will return your call as soon as possible. Messages left after 4 pm will be answered the following business day.   You may also send us  a message via MyChart. We typically respond to MyChart messages within 1-2 business days.  For prescription refills, please ask your pharmacy to contact our office. Our fax number is [phone].  If you have an urgent issue when the clinic is closed that cannot wait until the next business day, you can page your doctor at the number below.    Please note that while we do our best to be  available for urgent issues outside of office hours, we are not available 24/7.   If you have an urgent issue and are unable to reach us , you may choose to seek medical care at your doctor's office, retail clinic, urgent care center, or emergency room.  If you have a medical emergency, please immediately call 911 or go to the emergency department.  Pager Numbers  - Dr. Hester: [phone]  - Dr. Jackquline: [phone]  - Dr. Claudene: [phone]   In the event of inclement weather, please call our main line at [phone] for an update on the status of any delays or closures.  Dermatology Medication Tips: Please keep the boxes that topical medications come in in order to help keep track of the instructions about where and how to use these. Pharmacies typically print the medication instructions only on the boxes and not directly on the medication tubes.   If your medication is too expensive, please contact our office at [phone] option 4 or send us  a message through MyChart.   We are unable to tell what your co-pay for medications will be in advance as this is different depending on your insurance coverage. However, we may be able to find a substitute medication at lower cost or fill out paperwork to get insurance to cover a needed medication.   If a prior authorization is required to get your medication covered by your insurance company, please allow us  1-2 business days to complete  this process.  Drug prices often vary depending on where the prescription is filled and some pharmacies may offer cheaper prices.  The website www.goodrx.com contains coupons for medications through different pharmacies. The prices here do not account for what the cost may be with help from insurance (it may be cheaper with your insurance), but the website can give you the price if you did not use any insurance.  - You can print the associated coupon and take it with your prescription to the pharmacy.   - You may also stop by our office during regular business hours and pick up a GoodRx coupon card.  - If you need your prescription sent electronically to a different pharmacy, notify our office through [facility] or by phone at [phone] option 4.     Si Usted Necesita Algo Despus de Su Visita  Tambin puede enviarnos un mensaje a travs de Clinical Cytogeneticist. Por lo general respondemos a los mensajes de MyChart en el transcurso de 1 a 2 das hbiles.  Para renovar recetas, por favor pida a su farmacia que se ponga en contacto con nuestra oficina. Randi lakes de fax es [place] [phone].  Si tiene un asunto urgente cuando la clnica est cerrada y que no puede esperar hasta el siguiente da hbil, puede llamar/localizar a su doctor(a) al nmero que aparece a continuacin.   Por favor, tenga en cuenta que aunque hacemos todo lo posible para estar disponibles para asuntos urgentes fuera del horario de [place], no estamos disponibles las 24 horas del da, los 7 [address]  [phone] de la [place].   Si tiene un problema urgente y no puede comunicarse con nosotros, puede optar por buscar atencin mdica  en el consultorio de su doctor(a), en una clnica privada, en un centro de atencin urgente o en una sala de emergencias.  Si tiene engineer, drilling, por favor llame inmediatamente al 911 o vaya a la sala de emergencias.  Nmeros de bper  - Dr. Hester: [phone]  - Dra. Jackquline: [phone]  - Dr. Claudene: [phone]   En caso de inclemencias del tiempo, por favor llame a landry capes principal al [phone] para una actualizacin sobre el [place] de cualquier retraso o cierre.  Consejos para la medicacin en dermatologa: Por favor, guarde las cajas en las que vienen los medicamentos de uso tpico para ayudarle a seguir las instrucciones sobre dnde y cmo usarlos. Las farmacias generalmente imprimen las instrucciones del medicamento slo en las cajas y no directamente en los tubos del  [place].   Si su medicamento es muy caro, por favor, pngase en contacto con landry rieger llamando al [phone] y presione la opcin 4 o envenos un mensaje a travs de Clinical Cytogeneticist.   No podemos decirle cul ser su copago por los medicamentos por adelantado ya que esto es diferente dependiendo de la cobertura de su seguro. Sin embargo, es posible que podamos encontrar un medicamento sustituto a audiological scientist un formulario para que el seguro cubra el medicamento que se considera necesario.   Si se requiere una autorizacin previa para que su compaa de seguros cubra su medicamento, por favor permtanos de 1 a 2 das hbiles para completar este proceso.  Los precios de los medicamentos varan con frecuencia dependiendo del environmental consultant de dnde se surte la receta y alguna farmacias pueden ofrecer precios ms baratos.  El sitio web www.goodrx.com tiene cupones para medicamentos de health and safety inspector. Los precios aqu no tienen en cuenta lo que podra costar con la ayuda del  seguro (puede ser ms barato con su seguro), pero el sitio web puede darle el precio si no visual merchandiser.  - Puede imprimir el cupn correspondiente y llevarlo con su receta a la farmacia.  - Tambin puede pasar por nuestra oficina durante el horario de atencin regular y education officer, museum una tarjeta de cupones de GoodRx.  - Si necesita que su receta se enve electrnicamente a una farmacia diferente, informe a nuestra oficina a travs de MyChart de [place] o por telfono llamando al [phone] y presione la opcin 4.

## 2023-08-27 ENCOUNTER — Other Ambulatory Visit: Payer: Self-pay

## 2023-09-03 ENCOUNTER — Other Ambulatory Visit: Payer: Self-pay

## 2023-09-03 NOTE — Progress Notes (Signed)
 Specialty Pharmacy Refill Coordination Note  Jamie [name] is a 53 y.o. female contacted today regarding refills of specialty medication(s) Dupilumab  (Dupixent )   Patient requested Delivery   Delivery date: 09/11/23   Verified address: [address] [name], [address]   Medication will be filled on 09/10/23.

## 2023-09-10 ENCOUNTER — Other Ambulatory Visit: Payer: Self-pay

## 2023-09-10 ENCOUNTER — Other Ambulatory Visit (HOSPITAL_COMMUNITY): Payer: Self-pay

## 2023-09-11 ENCOUNTER — Other Ambulatory Visit: Payer: Self-pay

## 2023-09-12 ENCOUNTER — Other Ambulatory Visit: Payer: Self-pay

## 2023-09-12 DIAGNOSIS — Z79899 Other long term (current) drug therapy: Secondary | ICD-10-CM | POA: Diagnosis not present

## 2023-09-12 MED ORDER — OXYCODONE HCL 10 MG PO TABS
10.0000 mg | ORAL_TABLET | Freq: Four times a day (QID) | ORAL | 0 refills | Status: DC
  Filled 2023-09-18: qty 120, 30d supply, fill #0

## 2023-09-12 MED ORDER — MORPHINE SULFATE ER 30 MG PO TBCR
30.0000 mg | EXTENDED_RELEASE_TABLET | Freq: Every day | ORAL | 0 refills | Status: DC
  Filled 2023-10-14: qty 30, 30d supply, fill #0

## 2023-09-16 DIAGNOSIS — Z79899 Other long term (current) drug therapy: Secondary | ICD-10-CM | POA: Diagnosis not present

## 2023-09-17 ENCOUNTER — Other Ambulatory Visit: Payer: Self-pay

## 2023-09-18 ENCOUNTER — Other Ambulatory Visit: Payer: Self-pay

## 2023-09-27 ENCOUNTER — Other Ambulatory Visit (HOSPITAL_COMMUNITY): Payer: Self-pay

## 2023-10-01 ENCOUNTER — Other Ambulatory Visit (HOSPITAL_COMMUNITY): Payer: Self-pay

## 2023-10-01 NOTE — Progress Notes (Signed)
Specialty Pharmacy Refill Coordination Note  Jamie [name] is a 53 y.o. female contacted today regarding refills of specialty medication(s) Dupilumab (Dupixent)   Patient requested Delivery   Delivery date: 10/16/23   Verified address: [address]  [place] [address]   Medication will be filled on 10/15/23.

## 2023-10-03 ENCOUNTER — Other Ambulatory Visit (HOSPITAL_COMMUNITY): Payer: Self-pay

## 2023-10-08 ENCOUNTER — Other Ambulatory Visit: Payer: Self-pay

## 2023-10-11 DIAGNOSIS — H5213 Myopia, bilateral: Secondary | ICD-10-CM | POA: Diagnosis not present

## 2023-10-14 ENCOUNTER — Telehealth: Payer: Self-pay | Admitting: Orthopedic Surgery

## 2023-10-14 ENCOUNTER — Other Ambulatory Visit: Payer: Self-pay

## 2023-10-14 DIAGNOSIS — Z981 Arthrodesis status: Secondary | ICD-10-CM

## 2023-10-14 DIAGNOSIS — S32009K Unspecified fracture of unspecified lumbar vertebra, subsequent encounter for fracture with nonunion: Secondary | ICD-10-CM

## 2023-10-14 MED ORDER — METHOCARBAMOL 500 MG PO TABS
500.0000 mg | ORAL_TABLET | Freq: Three times a day (TID) | ORAL | 0 refills | Status: DC | PRN
  Filled 2023-10-14: qty 60, 20d supply, fill #0

## 2023-10-14 NOTE — Telephone Encounter (Signed)
 Patient is aware of this refill and further refills will have to come from pain management. She agreed.

## 2023-10-14 NOTE — Telephone Encounter (Signed)
 DOS: 03/11/23 PSF L4-L5 for pseudoarthrosis   Has follow up with Dr. Myer Haff on 11/28/23.   She sees Jamie [name] Pain management. I have refilled her robaxin and sent it to her pharmacy. Let her know she will need to get further refills from pain management.   Thanks.

## 2023-10-15 ENCOUNTER — Other Ambulatory Visit: Payer: Self-pay

## 2023-10-15 DIAGNOSIS — Z79899 Other long term (current) drug therapy: Secondary | ICD-10-CM | POA: Diagnosis not present

## 2023-10-16 ENCOUNTER — Other Ambulatory Visit: Payer: Self-pay

## 2023-10-16 MED ORDER — OXYCODONE HCL 10 MG PO TABS
10.0000 mg | ORAL_TABLET | Freq: Four times a day (QID) | ORAL | 0 refills | Status: DC
  Filled 2023-10-18: qty 120, 30d supply, fill #0

## 2023-10-16 MED ORDER — MORPHINE SULFATE ER 30 MG PO TBCR: 30.0000 mg | EXTENDED_RELEASE_TABLET | Freq: Every day | ORAL | 0 refills | Status: DC

## 2023-10-18 ENCOUNTER — Other Ambulatory Visit: Payer: Self-pay

## 2023-10-18 DIAGNOSIS — Z79899 Other long term (current) drug therapy: Secondary | ICD-10-CM | POA: Diagnosis not present

## 2023-11-05 ENCOUNTER — Other Ambulatory Visit: Payer: Self-pay

## 2023-11-05 NOTE — Progress Notes (Signed)
 Specialty Pharmacy Refill Coordination Note  Jamie [name] is a 53 y.o. female contacted today regarding refills of specialty medication(s) Dupilumab (Dupixent)   Patient requested (Patient-Rptd) Delivery   Delivery date: (Patient-Rptd) 11/13/23   Verified address: (Patient-Rptd) [address] [place] [address]   Medication will be filled on 11/12/23.

## 2023-11-11 ENCOUNTER — Other Ambulatory Visit: Payer: Self-pay

## 2023-11-11 DIAGNOSIS — M25512 Pain in left shoulder: Secondary | ICD-10-CM | POA: Diagnosis not present

## 2023-11-11 DIAGNOSIS — Z79899 Other long term (current) drug therapy: Secondary | ICD-10-CM | POA: Diagnosis not present

## 2023-11-11 MED ORDER — OXYCODONE HCL 10 MG PO TABS
10.0000 mg | ORAL_TABLET | Freq: Four times a day (QID) | ORAL | 0 refills | Status: DC
  Filled 2023-11-11 – 2023-11-18 (×3): qty 120, 30d supply, fill #0

## 2023-11-11 MED ORDER — METHOCARBAMOL 500 MG PO TABS
500.0000 mg | ORAL_TABLET | Freq: Three times a day (TID) | ORAL | 0 refills | Status: DC
  Filled 2023-11-11: qty 90, 30d supply, fill #0

## 2023-11-11 MED ORDER — MORPHINE SULFATE ER 30 MG PO TBCR
30.0000 mg | EXTENDED_RELEASE_TABLET | Freq: Every day | ORAL | 0 refills | Status: DC
  Filled 2023-11-11: qty 30, 30d supply, fill #0

## 2023-11-12 ENCOUNTER — Other Ambulatory Visit: Payer: Self-pay

## 2023-11-15 ENCOUNTER — Other Ambulatory Visit: Payer: Self-pay

## 2023-11-15 DIAGNOSIS — Z79899 Other long term (current) drug therapy: Secondary | ICD-10-CM | POA: Diagnosis not present

## 2023-11-18 ENCOUNTER — Other Ambulatory Visit: Payer: Self-pay

## 2023-11-28 ENCOUNTER — Ambulatory Visit: Payer: 59 | Admitting: Neurosurgery

## 2023-12-03 ENCOUNTER — Other Ambulatory Visit: Payer: Self-pay

## 2023-12-03 ENCOUNTER — Other Ambulatory Visit (HOSPITAL_COMMUNITY): Payer: Self-pay

## 2023-12-03 NOTE — Progress Notes (Signed)
 Specialty Pharmacy Refill Coordination Note  Jamie [name] is a 53 y.o. female contacted today regarding refills of specialty medication(s) Dupixent.  Patient requested (Patient-Rptd) Delivery   Delivery date: (Patient-Rptd) 12/17/23   Verified address: (Patient-Rptd) [address] [place], [place] [address]   Medication will be filled on 12/16/23.   This medication requires a new prior authorization, and is currently being processed by the PA team. Specialty Pharmacy team will contact patient if any delays arise.

## 2023-12-04 ENCOUNTER — Other Ambulatory Visit: Payer: Self-pay

## 2023-12-04 NOTE — Progress Notes (Signed)
 PA submitted.

## 2023-12-04 NOTE — Progress Notes (Signed)
 Pharmacy Patient Advocate Encounter  Received notification from [employer] that Prior Authorization for Dupixent has been APPROVED from 12/04/23 to 12/02/24   PA #/Case ID/Reference #: [id number]

## 2023-12-04 NOTE — Progress Notes (Signed)
 PA approved.

## 2023-12-04 NOTE — Progress Notes (Signed)
 Pharmacy Patient Advocate Encounter   Received notification from Patient Pharmacy that prior authorization for Dupixent is required/requested.   Insurance verification completed.   The patient is insured through [facility] .   Per test claim: PA required; PA submitted to above mentioned insurance via CoverMyMeds Key/confirmation #/EOC [id number] Status is pending

## 2023-12-05 ENCOUNTER — Other Ambulatory Visit: Payer: Self-pay

## 2023-12-11 DIAGNOSIS — Z79899 Other long term (current) drug therapy: Secondary | ICD-10-CM | POA: Diagnosis not present

## 2023-12-13 ENCOUNTER — Other Ambulatory Visit: Payer: Self-pay

## 2023-12-13 ENCOUNTER — Other Ambulatory Visit (HOSPITAL_COMMUNITY): Payer: Self-pay

## 2023-12-13 MED ORDER — NALOXONE HCL 4 MG/0.1ML NA LIQD
1.0000 | NASAL | 0 refills | Status: DC | PRN
Start: 2023-12-12 — End: 2024-04-15
  Filled 2023-12-13: qty 2, 15d supply, fill #0

## 2023-12-13 MED ORDER — METHOCARBAMOL 500 MG PO TABS
500.0000 mg | ORAL_TABLET | Freq: Three times a day (TID) | ORAL | 0 refills | Status: DC
  Filled 2023-12-13: qty 90, 30d supply, fill #0

## 2023-12-14 ENCOUNTER — Other Ambulatory Visit (HOSPITAL_COMMUNITY): Payer: Self-pay

## 2023-12-16 ENCOUNTER — Other Ambulatory Visit: Payer: Self-pay

## 2023-12-16 ENCOUNTER — Other Ambulatory Visit (HOSPITAL_COMMUNITY): Payer: Self-pay

## 2023-12-17 ENCOUNTER — Other Ambulatory Visit: Payer: Self-pay

## 2023-12-18 ENCOUNTER — Other Ambulatory Visit: Payer: Self-pay

## 2023-12-19 ENCOUNTER — Other Ambulatory Visit: Payer: Self-pay

## 2023-12-19 MED ORDER — OXYCODONE HCL 10 MG PO TABS
10.0000 mg | ORAL_TABLET | Freq: Four times a day (QID) | ORAL | 0 refills | Status: DC
Start: 2023-12-18 — End: 2024-01-10
  Filled 2023-12-19: qty 120, 30d supply, fill #0

## 2023-12-19 MED ORDER — MORPHINE SULFATE ER 30 MG PO TBCR
30.0000 mg | EXTENDED_RELEASE_TABLET | Freq: Every day | ORAL | 0 refills | Status: DC
  Filled 2023-12-19: qty 30, 30d supply, fill #0

## 2024-01-07 ENCOUNTER — Other Ambulatory Visit: Payer: Self-pay

## 2024-01-07 ENCOUNTER — Other Ambulatory Visit: Payer: Self-pay | Admitting: Pharmacy Technician

## 2024-01-07 NOTE — Progress Notes (Signed)
 Specialty Pharmacy Refill Coordination Note  Jamie [name] is a 53 y.o. female contacted today regarding refills of specialty medication(s) Dupilumab  (Dupixent )   Patient requested Delivery   Delivery date: 01/21/24   Verified address: [address] [place] [address]   Medication will be filled on 01/20/24.  Sent patient mychart for new delivery date of 01/21/24. We do not deliver on weekends.

## 2024-01-08 ENCOUNTER — Other Ambulatory Visit: Payer: Self-pay

## 2024-01-08 DIAGNOSIS — S32009K Unspecified fracture of unspecified lumbar vertebra, subsequent encounter for fracture with nonunion: Secondary | ICD-10-CM

## 2024-01-09 ENCOUNTER — Encounter: Payer: Self-pay | Admitting: Neurosurgery

## 2024-01-09 ENCOUNTER — Ambulatory Visit
Admission: RE | Admit: 2024-01-09 | Discharge: 2024-01-09 | Disposition: A | Discharge status: Home / Self Care | Source: Ambulatory Visit | Attending: Neurosurgery | Admitting: Neurosurgery

## 2024-01-09 ENCOUNTER — Ambulatory Visit
Admission: RE | Admit: 2024-01-09 | Discharge: 2024-01-09 | Disposition: A | Discharge status: Home / Self Care | Attending: Neurosurgery | Admitting: Neurosurgery

## 2024-01-09 ENCOUNTER — Ambulatory Visit (INDEPENDENT_AMBULATORY_CARE_PROVIDER_SITE_OTHER): Admitting: Neurosurgery

## 2024-01-09 VITALS — BP 114/70 | Ht 67.0 in | Wt 173.0 lb

## 2024-01-09 DIAGNOSIS — S32009K Unspecified fracture of unspecified lumbar vertebra, subsequent encounter for fracture with nonunion: Secondary | ICD-10-CM

## 2024-01-09 DIAGNOSIS — Z09 Encounter for follow-up examination after completed treatment for conditions other than malignant neoplasm: Secondary | ICD-10-CM

## 2024-01-09 DIAGNOSIS — M960 Pseudarthrosis after fusion or arthrodesis: Secondary | ICD-10-CM

## 2024-01-09 DIAGNOSIS — Z981 Arthrodesis status: Secondary | ICD-10-CM

## 2024-01-09 DIAGNOSIS — M48061 Spinal stenosis, lumbar region without neurogenic claudication: Secondary | ICD-10-CM | POA: Diagnosis not present

## 2024-01-09 NOTE — Progress Notes (Signed)
   REFERRING PHYSICIAN:  No referring provider defined for this encounter.  DOS: 03/11/23  PSF L4-L5 for pseudoarthrosis  HISTORY OF PRESENT ILLNESS: Jamie [name] is status post PSF L4-L5.  Overall, she is doing extremely well.  Her pain is usually around a 4.  That is much better than it previously was.  She is very happy with her current pain control.  PHYSICAL EXAMINATION:  General: Patient is well developed, well nourished, calm, collected, and in no apparent distress.   NEUROLOGICAL:  General: In no acute distress.   Awake, alert, oriented to person, place, and time.  Pupils equal round and reactive to light.  Facial tone is symmetric.     Strength:            Side Iliopsoas Quads Hamstring PF DF EHL  R 5 5 5 5 5 5   L 5 5 5 5 5 5    Incision c/d/I, sutures removed   ROS (Neurologic):  Negative except as noted above  IMAGING: No complications noted  ASSESSMENT/PLAN:  Jamie [name] is doing well s/p above surgery.  She has done extremely well and recovered with substantially less pain than prior to surgery.  I am very pleased with this.   I will see her back on an as-needed basis.    I spent a total of 10 minutes in this patient's care today. This time was spent reviewing pertinent records including imaging studies, obtaining and confirming history, performing a directed evaluation, formulating and discussing my recommendations, and documenting the visit within the medical record.    Jamie Munch MD Department of neurosurgery

## 2024-01-10 ENCOUNTER — Other Ambulatory Visit: Payer: Self-pay

## 2024-01-10 DIAGNOSIS — Z79899 Other long term (current) drug therapy: Secondary | ICD-10-CM | POA: Diagnosis not present

## 2024-01-10 MED ORDER — NALOXONE HCL 4 MG/0.1ML NA LIQD
1.0000 | NASAL | 0 refills | Status: DC | PRN
  Filled 2024-01-10 – 2024-01-24 (×2): qty 2, 30d supply, fill #0

## 2024-01-10 MED ORDER — OXYCODONE HCL 10 MG PO TABS
10.0000 mg | ORAL_TABLET | Freq: Four times a day (QID) | ORAL | 0 refills | Status: DC
Start: 2024-01-10 — End: 2024-02-10
  Filled 2024-01-17 (×2): qty 120, 30d supply, fill #0
  Filled 2024-01-18: qty 75, 18d supply, fill #0
  Filled 2024-01-18: qty 45, 12d supply, fill #0
  Filled ????-??-??: fill #0

## 2024-01-10 MED ORDER — MORPHINE SULFATE ER 30 MG PO TBCR
30.0000 mg | EXTENDED_RELEASE_TABLET | Freq: Every day | ORAL | 0 refills | Status: DC
Start: 2024-01-10 — End: 2024-04-15
  Filled 2024-01-17 – 2024-01-18 (×3): qty 30, 30d supply, fill #0
  Filled ????-??-??: fill #0

## 2024-01-10 MED ORDER — METHOCARBAMOL 500 MG PO TABS
500.0000 mg | ORAL_TABLET | Freq: Three times a day (TID) | ORAL | 0 refills | Status: DC
  Filled 2024-01-10 – 2024-01-24 (×2): qty 90, 30d supply, fill #0

## 2024-01-14 ENCOUNTER — Ambulatory Visit: Payer: 59 | Admitting: Dermatology

## 2024-01-16 ENCOUNTER — Other Ambulatory Visit: Payer: Self-pay

## 2024-01-17 ENCOUNTER — Other Ambulatory Visit (HOSPITAL_COMMUNITY): Payer: Self-pay

## 2024-01-17 ENCOUNTER — Other Ambulatory Visit: Payer: Self-pay

## 2024-01-18 ENCOUNTER — Other Ambulatory Visit (HOSPITAL_COMMUNITY): Payer: Self-pay

## 2024-01-20 ENCOUNTER — Other Ambulatory Visit: Payer: Self-pay

## 2024-01-22 ENCOUNTER — Other Ambulatory Visit (HOSPITAL_COMMUNITY): Payer: Self-pay

## 2024-01-22 ENCOUNTER — Other Ambulatory Visit: Payer: Self-pay

## 2024-01-23 ENCOUNTER — Other Ambulatory Visit (HOSPITAL_COMMUNITY): Payer: Self-pay

## 2024-01-24 ENCOUNTER — Other Ambulatory Visit: Payer: Self-pay

## 2024-01-29 ENCOUNTER — Other Ambulatory Visit: Payer: Self-pay | Admitting: Pharmacy Technician

## 2024-01-29 ENCOUNTER — Other Ambulatory Visit: Payer: Self-pay

## 2024-01-29 NOTE — Progress Notes (Signed)
 Specialty Pharmacy Refill Coordination Note  Jamie [name] is a 53 y.o. female contacted today regarding refills of specialty medication(s) Dupilumab  (Dupixent )   Patient requested Delivery   Delivery date: 02/18/24   Verified address: [address] [place] [address]   Medication will be filled on 02/17/24.

## 2024-01-29 NOTE — Progress Notes (Signed)
 Specialty Pharmacy Ongoing Clinical Assessment Note  Jamie [name] is a 53 y.o. female who is being followed by the specialty pharmacy service for RxSp Atopic Dermatitis   Patient's specialty medication(s) reviewed today: Dupilumab  (Dupixent )   Missed doses in the last 4 weeks: 0   Patient/Caregiver did not have any additional questions or concerns.   Therapeutic benefit summary: Patient is achieving benefit   Adverse events/side effects summary: No adverse events/side effects   Patient's therapy is appropriate to: Continue    Goals Addressed             This Visit's Progress    Reduce signs and symptoms   On track    Patient is on track. Patient will maintain adherence. Patient feels well-controlled, has a few spots that appear just prior to next injection but resolve on their own rather quickly. Not using creams/ointments at this time.         Follow up: 12 months  [facility]

## 2024-02-03 ENCOUNTER — Other Ambulatory Visit: Payer: Self-pay

## 2024-02-03 DIAGNOSIS — J0100 Acute maxillary sinusitis, unspecified: Secondary | ICD-10-CM | POA: Diagnosis not present

## 2024-02-03 DIAGNOSIS — H9202 Otalgia, left ear: Secondary | ICD-10-CM | POA: Diagnosis not present

## 2024-02-03 DIAGNOSIS — J029 Acute pharyngitis, unspecified: Secondary | ICD-10-CM | POA: Diagnosis not present

## 2024-02-03 DIAGNOSIS — R0981 Nasal congestion: Secondary | ICD-10-CM | POA: Diagnosis not present

## 2024-02-03 MED ORDER — LIDOCAINE VISCOUS HCL 2 % MT SOLN
5.0000 mL | OROMUCOSAL | 0 refills | Status: DC | PRN
  Filled 2024-02-03: qty 100, 10d supply, fill #0

## 2024-02-04 ENCOUNTER — Ambulatory Visit: Payer: Self-pay

## 2024-02-05 ENCOUNTER — Other Ambulatory Visit: Payer: Self-pay

## 2024-02-10 ENCOUNTER — Other Ambulatory Visit: Payer: Self-pay

## 2024-02-10 DIAGNOSIS — Z79899 Other long term (current) drug therapy: Secondary | ICD-10-CM | POA: Diagnosis not present

## 2024-02-10 MED ORDER — MORPHINE SULFATE ER 30 MG PO TBCR
30.0000 mg | EXTENDED_RELEASE_TABLET | Freq: Every day | ORAL | 0 refills | Status: DC
  Filled 2024-02-10 – 2024-02-17 (×2): qty 30, 30d supply, fill #0

## 2024-02-10 MED ORDER — METHOCARBAMOL 500 MG PO TABS
500.0000 mg | ORAL_TABLET | Freq: Three times a day (TID) | ORAL | 0 refills | Status: DC
  Filled 2024-02-10: qty 90, 30d supply, fill #0

## 2024-02-10 MED ORDER — OXYCODONE HCL 10 MG PO TABS
10.0000 mg | ORAL_TABLET | Freq: Four times a day (QID) | ORAL | 0 refills | Status: DC
  Filled 2024-02-10 – 2024-02-17 (×2): qty 120, 30d supply, fill #0

## 2024-02-13 ENCOUNTER — Other Ambulatory Visit: Payer: Self-pay

## 2024-02-13 DIAGNOSIS — Z79899 Other long term (current) drug therapy: Secondary | ICD-10-CM | POA: Diagnosis not present

## 2024-02-17 ENCOUNTER — Other Ambulatory Visit: Payer: Self-pay

## 2024-02-19 ENCOUNTER — Encounter: Payer: Self-pay | Admitting: Nurse Practitioner

## 2024-02-19 ENCOUNTER — Other Ambulatory Visit: Payer: Self-pay

## 2024-02-19 ENCOUNTER — Ambulatory Visit (INDEPENDENT_AMBULATORY_CARE_PROVIDER_SITE_OTHER): Admitting: Nurse Practitioner

## 2024-02-19 VITALS — BP 110/68 | HR 74 | Temp 98.2°F | Ht 67.0 in | Wt 177.6 lb

## 2024-02-19 DIAGNOSIS — H43811 Vitreous degeneration, right eye: Secondary | ICD-10-CM | POA: Diagnosis not present

## 2024-02-19 DIAGNOSIS — R519 Headache, unspecified: Secondary | ICD-10-CM | POA: Insufficient documentation

## 2024-02-19 DIAGNOSIS — G43809 Other migraine, not intractable, without status migrainosus: Secondary | ICD-10-CM | POA: Diagnosis not present

## 2024-02-19 MED ORDER — RIZATRIPTAN BENZOATE 10 MG PO TABS
10.0000 mg | ORAL_TABLET | ORAL | 4 refills | Status: AC | PRN
  Filled 2024-02-19: qty 10, 30d supply, fill #0

## 2024-02-19 NOTE — Progress Notes (Signed)
 BP 110/68   Pulse 74   Temp 98.2 F (36.8 C) (Oral)   Ht 5' 7 (1.702 m)   Wt 177 lb 9.6 oz (80.6 kg)   LMP 06/20/2016 (Approximate)   SpO2 100%   BMI 27.82 kg/m    Subjective:    Patient ID: Jamie [name], female    DOB: [birth year], 53 y.o.   MRN: [id number]  HPI: Jamie [name] is a 53 y.o. female  Chief Complaint  Patient presents with   Headache    Patient states she has been experiencing off and on headaches since Friday. States she feels shooting pains on the right side of her head. States she was having issues with her vision as well, saw optometry this morning for the vision.    HEADACHES Started Friday 02/14/24.  In past has never taken anything for headaches.  Never diagnosed with migraines.  Has been completely off her psychiatric medications for 2 months, self stopped.  Yesterday and today no headaches. Headaches were as follows: 1 Friday 30-45 minutes 2 episodes Saturday  30-45 minutes 1 Sunday  30-45 minutes 1 Monday 30-45 minutes  Went to ophthalmology this morning for vision changes with the headaches. They did see a floater, but they are not concerned about this she reports and will recheck annually.  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 Duration: days Onset: sudden Severity: 5/10 Quality: aching, shooting, and throbbing with loss of peripheral vision Frequency: intermittent Location: Right side and upper scalp Headache duration: 30-45 minutes Radiation: no Time of day headache occurs: varied Alleviating factors: closing eyes and relaxing Aggravating factors: nothing Headache status at time of visit: asymptomatic Treatments attempted: Tylenol  Aura: no Nausea:  no Vomiting: no Photophobia:  yes Phonophobia:  no Effect on social functioning:  no Numbers of missed days of school/work each month: 0 Confusion:  no Gait disturbance/ataxia:  no Behavioral changes:  no Fevers:  no      02/19/2024    2:19 PM 04/15/2023   11:17 AM 01/18/2023    4:09 PM  02/23/2022    9:04 AM 12/21/2021    4:10 PM  Depression screen PHQ 2/9  Decreased Interest 2 2 2 2 2   Down, Depressed, Hopeless 2 2 3 3 2   PHQ - 2 Score 4 4 5 5 4   Altered sleeping 3 3 3 3 3   Tired, decreased energy 2 1 2 1 2   Change in appetite 2 2 3 3 3   Feeling bad or failure about yourself  2 2 3 3 2   Trouble concentrating 3 3 3 3 3   Moving slowly or fidgety/restless 3 3 3 3 3   Suicidal thoughts 0 0 0 0 0  PHQ-9 Score 19 18 22 21 20   Difficult doing work/chores Somewhat difficult Somewhat difficult Somewhat difficult Very difficult        02/19/2024    2:20 PM 04/15/2023   11:17 AM 01/18/2023    4:10 PM 02/23/2022    9:04 AM  GAD 7 : Generalized Anxiety Score  Nervous, Anxious, on Edge 3 3 3 3   Control/stop worrying 2 2 3 3   Worry too much - different things 3 3 3 3   Trouble relaxing 3 3 3 3   Restless 3 3 3 3   Easily annoyed or irritable 2 2 3 2   Afraid - awful might happen 2 2 2 3   Total GAD 7 Score 18 18 20 20   Anxiety Difficulty Somewhat difficult Somewhat difficult Somewhat difficult Extremely difficult    Relevant  past medical, surgical, family and social history reviewed and updated as indicated. Interim medical history since our last visit reviewed. Allergies and medications reviewed and updated.  Review of Systems  Constitutional:  Negative for activity change, appetite change, diaphoresis, fatigue and fever.  Respiratory:  Negative for cough, chest tightness, shortness of breath and wheezing.   Cardiovascular:  Negative for chest pain, palpitations and leg swelling.  Neurological:  Positive for headaches. Negative for dizziness, syncope, facial asymmetry, speech difficulty, weakness and numbness.  Psychiatric/Behavioral:  Positive for decreased concentration and sleep disturbance. Negative for self-injury and suicidal ideas. The patient is nervous/anxious.     Per HPI unless specifically indicated above     Objective:    BP 110/68   Pulse 74   Temp 98.2 F  (36.8 C) (Oral)   Ht 5' 7 (1.702 m)   Wt 177 lb 9.6 oz (80.6 kg)   LMP 06/20/2016 (Approximate)   SpO2 100%   BMI 27.82 kg/m   Wt Readings from Last 3 Encounters:  02/19/24 177 lb 9.6 oz (80.6 kg)  01/09/24 173 lb (78.5 kg)  05/30/23 175 lb (79.4 kg)    Physical Exam Vitals and nursing note reviewed.  Constitutional:      General: She is awake. She is not in acute distress.    Appearance: She is well-developed and well-groomed. She is not ill-appearing or toxic-appearing.  HENT:     Head: Normocephalic.     Right Ear: Hearing, tympanic membrane, ear canal and external ear normal.     Left Ear: Hearing, tympanic membrane, ear canal and external ear normal.     Nose: Nose normal.     Right Sinus: No maxillary sinus tenderness or frontal sinus tenderness.     Left Sinus: No maxillary sinus tenderness or frontal sinus tenderness.     Mouth/Throat:     Mouth: Mucous membranes are moist.  Eyes:     General: Lids are normal.        Right eye: No discharge.        Left eye: No discharge.     Conjunctiva/sclera: Conjunctivae normal.     Pupils: Pupils are equal, round, and reactive to light.  Neck:     Thyroid: No thyromegaly.     Vascular: No carotid bruit.  Cardiovascular:     Rate and Rhythm: Normal rate and regular rhythm.     Heart sounds: Normal heart sounds. No murmur heard.    No gallop.  Pulmonary:     Effort: Pulmonary effort is normal. No accessory muscle usage or respiratory distress.     Breath sounds: Normal breath sounds. No decreased breath sounds, wheezing or rales.  Abdominal:     General: Bowel sounds are normal. There is no distension.     Palpations: Abdomen is soft.     Tenderness: There is no abdominal tenderness.  Musculoskeletal:     Cervical back: Normal range of motion and neck supple.     Right lower leg: No edema.     Left lower leg: No edema.  Lymphadenopathy:     Cervical: No cervical adenopathy.  Skin:    General: Skin is warm and dry.   Neurological:     Mental Status: She is alert and oriented to person, place, and time.     Cranial Nerves: Cranial nerves 2-12 are intact.     Motor: Motor function is intact.     Coordination: Coordination is intact. Romberg sign negative.  Gait: Gait is intact.     Deep Tendon Reflexes: Reflexes are normal and symmetric.     Reflex Scores:      Brachioradialis reflexes are 2+ on the right side and 2+ on the left side.      Patellar reflexes are 2+ on the right side and 2+ on the left side. Psychiatric:        Attention and Perception: Attention normal.        Mood and Affect: Mood normal.        Speech: Speech normal.        Behavior: Behavior normal. Behavior is cooperative.        Thought Content: Thought content normal.    Results for orders placed or performed in visit on 04/15/23  Microscopic Examination   Collection Time: 04/15/23 11:30 AM   Urine  Result Value Ref Range   WBC, UA None seen 0 - 5 /hpf   RBC, Urine 0-2 0 - 2 /hpf   Epithelial Cells (non renal) 0-10 0 - 10 /hpf   Bacteria, UA None seen None seen/Few  Urinalysis, Routine w reflex microscopic   Collection Time: 04/15/23 11:30 AM  Result Value Ref Range   Specific Gravity, UA 1.010 1.005 - 1.030   pH, UA 5.5 5.0 - 7.5   Color, UA Yellow Yellow   Appearance Ur Clear Clear   Leukocytes,UA Negative Negative   Protein,UA Negative Negative/Trace   Glucose, UA Negative Negative   Ketones, UA Negative Negative   RBC, UA Trace (A) Negative   Bilirubin, UA Negative Negative   Urobilinogen, Ur 0.2 0.2 - 1.0 mg/dL   Nitrite, UA Negative Negative   Microscopic Examination See below:   CBC with Differential/Platelet   Collection Time: 04/15/23 11:35 AM  Result Value Ref Range   WBC 6.5 3.4 - 10.8 x10E3/uL   RBC 3.75 (L) 3.77 - 5.28 x10E6/uL   Hemoglobin 11.8 11.1 - 15.9 g/dL   Hematocrit 63.5 65.9 - 46.6 %   MCV 97 79 - 97 fL   MCH 31.5 26.6 - 33.0 pg   MCHC 32.4 31.5 - 35.7 g/dL   RDW 86.3 88.2 - 84.5  %   Platelets 247 150 - 450 x10E3/uL   Neutrophils 56 Not Estab. %   Lymphs 37 Not Estab. %   Monocytes 7 Not Estab. %   Eos 0 Not Estab. %   Basos 0 Not Estab. %   Neutrophils Absolute 3.6 1.4 - 7.0 x10E3/uL   Lymphocytes Absolute 2.4 0.7 - 3.1 x10E3/uL   Monocytes Absolute 0.4 0.1 - 0.9 x10E3/uL   EOS (ABSOLUTE) 0.0 0.0 - 0.4 x10E3/uL   Basophils Absolute 0.0 0.0 - 0.2 x10E3/uL   Immature Granulocytes 0 Not Estab. %   Immature Grans (Abs) 0.0 0.0 - 0.1 x10E3/uL  Comprehensive metabolic panel   Collection Time: 04/15/23 11:35 AM  Result Value Ref Range   Glucose 107 (H) 70 - 99 mg/dL   BUN 8 6 - 24 mg/dL   Creatinine, Ser 9.29 0.57 - 1.00 mg/dL   eGFR 896 >40 fO/fpw/8.26   BUN/Creatinine Ratio 11 9 - 23   Sodium 138 134 - 144 mmol/L   Potassium 4.2 3.5 - 5.2 mmol/L   Chloride 102 96 - 106 mmol/L   CO2 23 20 - 29 mmol/L   Calcium 9.4 8.7 - 10.2 mg/dL   Total Protein 6.6 6.0 - 8.5 g/dL   Albumin  4.2 3.8 - 4.9 g/dL   Globulin, Total 2.4 1.5 -  4.5 g/dL   Bilirubin Total 0.3 0.0 - 1.2 mg/dL   Alkaline Phosphatase 113 44 - 121 IU/L   AST 18 0 - 40 IU/L   ALT 14 0 - 32 IU/L  Lipid Panel w/o Chol/HDL Ratio   Collection Time: 04/15/23 11:35 AM  Result Value Ref Range   Cholesterol, Total 220 (H) 100 - 199 mg/dL   Triglycerides 96 0 - 149 mg/dL   HDL 50 >60 mg/dL   VLDL Cholesterol Cal 17 5 - 40 mg/dL   LDL Chol Calc (NIH) 846 (H) 0 - 99 mg/dL  TSH   Collection Time: 04/15/23 11:35 AM  Result Value Ref Range   TSH 1.400 0.450 - 4.500 uIU/mL      Assessment & Plan:   Problem List Items Addressed This Visit       Other   Generalized headaches - Primary   For 4 days had episodes of acute headaches, these have now improved.  Had vision changes with these and saw ophthalmology this morning. ?ocular migraines.  Overall neuro exam reassuring and no bruits noted.  Will send in Maxalt to take as needed, educated her on this and side effects.  She is to alert us  if ongoing or  worsening as then may need to obtain imaging.  Strict ER precautions given.      Relevant Medications   rizatriptan (MAXALT) 10 MG tablet     Follow up plan: Return if symptoms worsen or fail to improve, for as scheduled in August.

## 2024-02-19 NOTE — Assessment & Plan Note (Addendum)
 For 4 days had episodes of acute headaches, these have now improved.  Had vision changes with these and saw ophthalmology this morning. ?ocular migraines.  Overall neuro exam reassuring and no bruits noted.  Will send in Maxalt to take as needed, educated her on this and side effects.  She is to alert us  if ongoing or worsening as then may need to obtain imaging.  Strict ER precautions given.

## 2024-02-19 NOTE — Patient Instructions (Signed)
Form - Headache Record There are many types and causes of headaches. A headache record can help guide your treatment plan. Use this form to record the details. Bring this form with you to your follow-up visits. Follow your health care provider's instructions on how to describe your headache. You may be asked to: Use a pain scale. This is a tool to rate the intensity of your headache using words or numbers. Describe what your headache feels like, such as dull, achy, throbbing, or sharp. Headache record Date: _______________ Time (from start to end): ____________________ Location of the headache: _________________________ Intensity of the headache: ____________________ Description of the headache: ______________________________________________________________ Hours of sleep the night before the headache: __________ Food or drinks before the headache started: ______________________________________________________________________________________ Events before the headache started: _______________________________________________________________________________________________ Symptoms before the headache started: __________________________________________________________________________________________ Symptoms during the headache: __________________________________________________________________________________________________ Treatment: ________________________________________________________________________________________________________________ Effect of treatment: _________________________________________________________________________________________________________ Other comments: ___________________________________________________________________________________________________________ Date: _______________ Time (from start to end): ____________________ Location of the headache: _________________________ Intensity of the headache: ____________________ Description of the headache:  ______________________________________________________________ Hours of sleep the night before the headache: __________ Food or drinks before the headache started: ______________________________________________________________________________________ Events before the headache started: ____________________________________________________________________________________________ Symptoms before the headache started: _________________________________________________________________________________________ Symptoms during the headache: _______________________________________________________________________________________________ Treatment: ________________________________________________________________________________________________________________ Effect of treatment: _________________________________________________________________________________________________________ Other comments: ___________________________________________________________________________________________________________ Date: _______________ Time (from start to end): ____________________ Location of the headache: _________________________ Intensity of the headache: ____________________ Description of the headache: ______________________________________________________________ Hours of sleep the night before the headache: __________ Food or drinks before the headache started: ______________________________________________________________________________________ Events before the headache started: ____________________________________________________________________________________________ Symptoms before the headache started: _________________________________________________________________________________________ Symptoms during the headache: _______________________________________________________________________________________________ Treatment:  ________________________________________________________________________________________________________________ Effect of treatment: _________________________________________________________________________________________________________ Other comments: ___________________________________________________________________________________________________________ Date: _______________ Time (from start to end): ____________________ Location of the headache: _________________________ Intensity of the headache: ____________________ Description of the headache: ______________________________________________________________ Hours of sleep the night before the headache: _________ Food or drinks before the headache started: ______________________________________________________________________________________ Events before the headache started: ____________________________________________________________________________________________ Symptoms before the headache started: _________________________________________________________________________________________ Symptoms during the headache: _______________________________________________________________________________________________ Treatment: ________________________________________________________________________________________________________________ Effect of treatment: _________________________________________________________________________________________________________ Other comments: ___________________________________________________________________________________________________________ Date: _______________ Time (from start to end): ____________________ Location of the headache: _________________________ Intensity of the headache: ____________________ Description of the headache: ______________________________________________________________ Hours of sleep the night before the headache: _________ Food or drinks before the headache started:  ______________________________________________________________________________________ Events before the headache started: ____________________________________________________________________________________________ Symptoms before the headache started: _________________________________________________________________________________________ Symptoms during the headache: _______________________________________________________________________________________________ Treatment: ________________________________________________________________________________________________________________ Effect of treatment: _________________________________________________________________________________________________________ Other comments: ___________________________________________________________________________________________________________ This information is not intended to replace advice given to you by your health care provider. Make sure you discuss any questions you have with your health care provider. Document Revised: 01/04/2021 Document Reviewed: 01/04/2021 [employer]vier Patient Education  2024 [employer].

## 2024-02-26 ENCOUNTER — Ambulatory Visit: Admitting: Dermatology

## 2024-02-26 ENCOUNTER — Other Ambulatory Visit: Payer: Self-pay

## 2024-02-26 DIAGNOSIS — Z79899 Other long term (current) drug therapy: Secondary | ICD-10-CM | POA: Diagnosis not present

## 2024-02-26 DIAGNOSIS — L309 Dermatitis, unspecified: Secondary | ICD-10-CM | POA: Diagnosis not present

## 2024-02-26 DIAGNOSIS — L2089 Other atopic dermatitis: Secondary | ICD-10-CM

## 2024-02-26 DIAGNOSIS — L209 Atopic dermatitis, unspecified: Secondary | ICD-10-CM

## 2024-02-26 MED ORDER — VTAMA 1 % EX CREA: TOPICAL_CREAM | CUTANEOUS | 2 refills | Status: AC

## 2024-02-26 MED ORDER — DUPIXENT 300 MG/2ML ~~LOC~~ SOAJ
300.0000 mg | SUBCUTANEOUS | 5 refills | Status: DC
  Filled 2024-02-26: qty 4, 28d supply, fill #0

## 2024-02-26 NOTE — Patient Instructions (Addendum)
 Your prescription was sent to [facility] in [place]. A representative from [facility] will contact you within 3 business hours to verify your address and insurance information to schedule a free delivery. If for any reason you do not receive a phone call from them, please reach out to them. Their phone number is [phone] and their hours are Monday-Friday 9:00 am-5:00 pm.     Due to recent changes in healthcare laws, you may see results of your pathology and/or laboratory studies on MyChart before the doctors have had a chance to review them. We understand that in some cases there may be results that are confusing or concerning to you. Please understand that not all results are received at the same time and often the doctors may need to interpret multiple results in order to provide you with the best plan of care or course of treatment. Therefore, we ask that you please give [place] 2 business days to thoroughly review all your results before contacting the office for clarification. Should we see a critical lab result, you will be contacted sooner.   If You Need Anything After Your Visit  If you have any questions or concerns for your doctor, please call our main line at [phone] and press option 4 to reach your doctor's medical assistant. If no one answers, please leave a voicemail as directed and we will return your call as soon as possible. Messages left after 4 pm will be answered the following business day.   You may also send [place] a message via MyChart. We typically respond to MyChart messages within 1-2 business days.  For prescription refills, please ask your pharmacy to contact our office. Our fax number is [phone].  If you have an urgent issue when the clinic is closed that cannot wait until the next business day, you can page your doctor at the number below.    Please note that while we do our best to be available for urgent issues outside of office hours, we are not  available 24/7.   If you have an urgent issue and are unable to reach [place], you may choose to seek medical care at your doctor's office, retail clinic, urgent care center, or emergency room.  If you have a medical emergency, please immediately call 911 or go to the emergency department.  Pager Numbers  - Dr. Gwen Pounds: [phone]  - Dr. Roseanne Reno: [phone]  - Dr. Katrinka Blazing: [phone]   In the event of inclement weather, please call our main line at [phone] for an update on the status of any delays or closures.  Dermatology Medication Tips: Please keep the boxes that topical medications come in in order to help keep track of the instructions about where and how to use these. Pharmacies typically print the medication instructions only on the boxes and not directly on the medication tubes.   If your medication is too expensive, please contact our office at [phone] option 4 or send [place] a message through MyChart.   We are unable to tell what your co-pay for medications will be in advance as this is different depending on your insurance coverage. However, we may be able to find a substitute medication at lower cost or fill out paperwork to get insurance to cover a needed medication.   If a prior authorization is required to get your medication covered by your insurance company, please allow [place] 1-2 business days to complete this process.  Drug prices often vary depending on where the prescription is  filled and some pharmacies may offer cheaper prices.  The website www.goodrx.com contains coupons for medications through different pharmacies. The prices here do not account for what the cost may be with help from insurance (it may be cheaper with your insurance), but the website can give you the price if you did not use any insurance.  - You can print the associated coupon and take it with your prescription to the pharmacy.  - You may also stop by our office during regular business hours  and pick up a GoodRx coupon card.  - If you need your prescription sent electronically to a different pharmacy, notify our office through [facility] or by phone at [phone] option 4.     Si Usted Necesita Algo Despus de Su Visita  Tambin puede enviarnos un mensaje a travs de Clinical cytogeneticist. Por lo general respondemos a los mensajes de MyChart en el transcurso de 1 a 2 das hbiles.  Para renovar recetas, por favor pida a su farmacia que se ponga en contacto con nuestra oficina. Annie Sable de fax es [place] [phone].  Si tiene un asunto urgente cuando la clnica est cerrada y que no puede esperar hasta el siguiente da hbil, puede llamar/localizar a su doctor(a) al nmero que aparece a continuacin.   Por favor, tenga en cuenta que aunque hacemos todo lo posible para estar disponibles para asuntos urgentes fuera del horario de [place], no estamos disponibles las 24 horas del da, los 7 [address]  [address] de la [place].   Si tiene un problema urgente y no puede comunicarse con nosotros, puede optar por buscar atencin mdica  en el consultorio de su doctor(a), en una clnica privada, en un centro de atencin urgente o en una sala de emergencias.  Si tiene Engineer, drilling, por favor llame inmediatamente al 911 o vaya a la sala de emergencias.  Nmeros de bper  - Dr. Gwen Pounds: [phone]  - Dra. Roseanne Reno: [phone]  - Dr. Katrinka Blazing: [phone]   En caso de inclemencias del tiempo, por favor llame a Lacy Duverney principal al [phone] para una actualizacin sobre el [place] de cualquier retraso o cierre.  Consejos para la medicacin en dermatologa: Por favor, guarde las cajas en las que vienen los medicamentos de uso tpico para ayudarle a seguir las instrucciones sobre dnde y cmo usarlos. Las farmacias generalmente imprimen las instrucciones del medicamento slo en las cajas y no directamente en los tubos del [place].   Si su medicamento es muy caro, por favor, pngase  en contacto con Rolm Gala llamando al [phone] y presione la opcin 4 o envenos un mensaje a travs de Clinical cytogeneticist.   No podemos decirle cul ser su copago por los medicamentos por adelantado ya que esto es diferente dependiendo de la cobertura de su seguro. Sin embargo, es posible que podamos encontrar un medicamento sustituto a Audiological scientist un formulario para que el seguro cubra el medicamento que se considera necesario.   Si se requiere una autorizacin previa para que su compaa de seguros [place] su medicamento, por favor permtanos de 1 a 2 das hbiles para completar [address]  Los precios de los medicamentos varan con frecuencia dependiendo del Environmental consultant de dnde se surte la receta y alguna farmacias pueden ofrecer precios ms baratos.  El sitio web www.goodrx.com tiene cupones para medicamentos de Health and safety inspector. Los precios aqu no tienen en cuenta lo que podra costar con la ayuda del seguro (puede ser ms barato con su seguro), pero el sitio web puede  darle el precio si no utiliz [employer].  - Puede imprimir el cupn correspondiente y llevarlo con su receta a la farmacia.  - Tambin puede pasar por nuestra oficina durante el horario de atencin regular y Education officer, museum una tarjeta de cupones de GoodRx.  - Si necesita que su receta se enve electrnicamente a una farmacia diferente, informe a nuestra oficina a travs de MyChart de [place] o por telfono llamando al [phone] y presione la opcin 4.

## 2024-02-26 NOTE — Progress Notes (Signed)
   Follow-Up Visit   Subjective  Jamie [name] is a 53 y.o. female who presents for the following: Atopic Dermatitis  At hands and feet. Patient started Dupixent  injections 05/23/2021, she is not using anything topically. Feet are doing well, mild flare at hands but overall improved. No side effects on Dupixent . Patient self-injecting at home.   Patient advises that she did have to see optometrist within the last few weeks due to ocular migraines.   The following portions of the chart were reviewed this encounter and updated as appropriate: medications, allergies, medical history  Review of Systems:  No other skin or systemic complaints except as noted in HPI or Assessment and Plan.  Objective  Well appearing patient in no apparent distress; mood and affect are within normal limits.  Areas Examined: Hands, feet  Relevant physical exam findings are noted in the Assessment and Plan.    Assessment & Plan   HAND DERMATITIS   Related Medications Dupilumab  (DUPIXENT ) 300 MG/2ML SOAJ Inject 300 mg into the skin every 14 (fourteen) days.   ATOPIC DERMATITIS Exam: erythema and hyperkeratosis at bilateral palm and palmar fingers, mild erythema and scale at left lateral medial plantar  foot, vesicle at right plantar foot 3% BSA  Chronic and persistent condition with duration or expected duration over one year. Condition is improving with treatment but not currently at goal.   Atopic dermatitis - Severe, on Dupixent  (biologic medication).  Atopic dermatitis (eczema) is a chronic, relapsing, pruritic condition that can significantly affect quality of life. It is often associated with allergic rhinitis and/or asthma and can require treatment with topical medications, phototherapy, or in severe cases biologic medications, which require long term medication management.    Treatment Plan:  Continue Dupixent  300 mg/2mL SQ QOW. Patient denies side effects. Start Vtama  once daily to aa  hands, feet. Sample given x 2. Lot # [id number]  Exp: Jan 2027  Potential side effects include allergic reaction, herpes infections, injection site reactions and conjunctivitis (inflammation of the eyes).  The use of Dupixent  requires long term medication management, including periodic office visits.    Long term medication management.  Patient is using long term (months to years) prescription medication  to control their dermatologic condition.  These medications require periodic monitoring to evaluate for efficacy and side effects and may require periodic laboratory monitoring.   Recommend gentle skin care.   Return in about 6 months (around 08/28/2024) for TBSE, Atopic Dermatitis, with Dr. Jackquline, HxDN.  LILLETTE Lonell Drones, RMA, am acting as scribe for Rexene Jackquline, MD .   Documentation: I have reviewed the above documentation for accuracy and completeness, and I agree with the above.  Rexene Jackquline, MD

## 2024-02-27 ENCOUNTER — Other Ambulatory Visit (HOSPITAL_COMMUNITY): Payer: Self-pay

## 2024-02-27 ENCOUNTER — Other Ambulatory Visit: Payer: Self-pay | Admitting: Pharmacist

## 2024-02-27 DIAGNOSIS — L309 Dermatitis, unspecified: Secondary | ICD-10-CM

## 2024-02-27 MED ORDER — DUPIXENT 300 MG/2ML ~~LOC~~ SOAJ
300.0000 mg | SUBCUTANEOUS | 5 refills | Status: DC
  Filled 2024-02-27 – 2024-03-10 (×2): qty 4, 28d supply, fill #0
  Filled 2024-03-27: qty 4, 28d supply, fill #1
  Filled 2024-05-05: qty 4, 28d supply, fill #2
  Filled 2024-06-16: qty 4, 28d supply, fill #3
  Filled 2024-07-20: qty 4, 28d supply, fill #4

## 2024-03-10 ENCOUNTER — Other Ambulatory Visit: Payer: Self-pay

## 2024-03-10 ENCOUNTER — Encounter (INDEPENDENT_AMBULATORY_CARE_PROVIDER_SITE_OTHER): Payer: Self-pay

## 2024-03-10 NOTE — Progress Notes (Signed)
 Specialty Pharmacy Refill Coordination Note  Daneesha Quinteros is a 53 y.o. female contacted today regarding refills of specialty medication(s) Dupilumab  (Dupixent )   Patient requested (Patient-Rptd) Delivery   Delivery date: 03/12/24   Verified address: (Patient-Rptd) [address] [place] [address]   Medication will be filled on 07.23.25.

## 2024-03-11 ENCOUNTER — Other Ambulatory Visit: Payer: Self-pay

## 2024-03-11 DIAGNOSIS — Z79899 Other long term (current) drug therapy: Secondary | ICD-10-CM | POA: Diagnosis not present

## 2024-03-11 MED ORDER — NALOXONE HCL 4 MG/0.1ML NA LIQD
NASAL | 0 refills | Status: DC
  Filled 2024-03-11: qty 2, 15d supply, fill #0

## 2024-03-11 MED ORDER — METHOCARBAMOL 500 MG PO TABS
500.0000 mg | ORAL_TABLET | Freq: Three times a day (TID) | ORAL | 0 refills | Status: DC
  Filled 2024-03-11: qty 90, 30d supply, fill #0

## 2024-03-11 MED ORDER — MORPHINE SULFATE ER 30 MG PO TBCR
30.0000 mg | EXTENDED_RELEASE_TABLET | Freq: Every day | ORAL | 0 refills | Status: DC
  Filled 2024-03-18: qty 30, 30d supply, fill #0

## 2024-03-11 MED ORDER — OXYCODONE HCL 10 MG PO TABS
10.0000 mg | ORAL_TABLET | Freq: Four times a day (QID) | ORAL | 0 refills | Status: DC
  Filled 2024-03-18: qty 120, 30d supply, fill #0

## 2024-03-11 MED ORDER — GABAPENTIN 600 MG PO TABS
600.0000 mg | ORAL_TABLET | Freq: Three times a day (TID) | ORAL | 0 refills | Status: DC
  Filled 2024-03-11: qty 90, 30d supply, fill #0

## 2024-03-18 ENCOUNTER — Other Ambulatory Visit: Payer: Self-pay

## 2024-03-19 DIAGNOSIS — M25561 Pain in right knee: Secondary | ICD-10-CM | POA: Diagnosis not present

## 2024-03-27 ENCOUNTER — Other Ambulatory Visit: Payer: Self-pay

## 2024-03-27 ENCOUNTER — Encounter (INDEPENDENT_AMBULATORY_CARE_PROVIDER_SITE_OTHER): Payer: Self-pay

## 2024-03-27 NOTE — Progress Notes (Signed)
 Specialty Pharmacy Refill Coordination Note  Jamie [name] is a 53 y.o. female contacted today regarding refills of specialty medication(s) Dupilumab  (Dupixent )   Patient requested (Patient-Rptd) Delivery   Delivery date: 04/01/24   Verified address: (Patient-Rptd) [address] [place] [address]   Medication will be filled on 03/31/2024.

## 2024-03-31 ENCOUNTER — Other Ambulatory Visit: Payer: Self-pay

## 2024-04-10 ENCOUNTER — Other Ambulatory Visit: Payer: Self-pay

## 2024-04-10 DIAGNOSIS — Z79899 Other long term (current) drug therapy: Secondary | ICD-10-CM | POA: Diagnosis not present

## 2024-04-10 MED ORDER — GABAPENTIN 600 MG PO TABS
600.0000 mg | ORAL_TABLET | Freq: Three times a day (TID) | ORAL | 0 refills | Status: DC
  Filled 2024-04-10: qty 90, 30d supply, fill #0

## 2024-04-10 MED ORDER — NALOXONE HCL 4 MG/0.1ML NA LIQD
1.0000 | NASAL | 0 refills | Status: DC
  Filled 2024-04-10: qty 2, 30d supply, fill #0

## 2024-04-10 MED ORDER — MORPHINE SULFATE ER 30 MG PO TBCR
30.0000 mg | EXTENDED_RELEASE_TABLET | Freq: Every day | ORAL | 0 refills | Status: DC
Start: 2024-04-10 — End: 2024-05-08
  Filled 2024-04-17: qty 30, 30d supply, fill #0

## 2024-04-10 MED ORDER — OXYCODONE HCL 10 MG PO TABS
10.0000 mg | ORAL_TABLET | Freq: Four times a day (QID) | ORAL | 0 refills | Status: DC
  Filled 2024-04-17: qty 120, 30d supply, fill #0

## 2024-04-10 MED ORDER — METHOCARBAMOL 500 MG PO TABS
500.0000 mg | ORAL_TABLET | Freq: Three times a day (TID) | ORAL | 0 refills | Status: DC
  Filled 2024-04-10: qty 90, 30d supply, fill #0

## 2024-04-15 ENCOUNTER — Other Ambulatory Visit: Payer: Self-pay

## 2024-04-15 ENCOUNTER — Ambulatory Visit (INDEPENDENT_AMBULATORY_CARE_PROVIDER_SITE_OTHER): Payer: Self-pay | Admitting: Family Medicine

## 2024-04-15 ENCOUNTER — Encounter: Payer: Self-pay | Admitting: Family Medicine

## 2024-04-15 VITALS — BP 123/72 | HR 73 | Temp 98.4°F | Ht 67.0 in | Wt 174.0 lb

## 2024-04-15 DIAGNOSIS — Z1231 Encounter for screening mammogram for malignant neoplasm of breast: Secondary | ICD-10-CM | POA: Diagnosis not present

## 2024-04-15 DIAGNOSIS — Z0000 Encounter for general adult medical examination without abnormal findings: Secondary | ICD-10-CM | POA: Diagnosis not present

## 2024-04-15 MED ORDER — MONTELUKAST SODIUM 10 MG PO TABS
10.0000 mg | ORAL_TABLET | Freq: Every day | ORAL | 3 refills | Status: AC
  Filled 2024-04-15 – 2024-05-29 (×2): qty 90, 90d supply, fill #0
  Filled 2024-08-22: qty 90, 90d supply, fill #1

## 2024-04-15 MED ORDER — FLUTICASONE PROPIONATE 50 MCG/ACT NA SUSP
1.0000 | Freq: Every day | NASAL | 12 refills | Status: AC | PRN
  Filled 2024-04-15: qty 16, 60d supply, fill #0
  Filled 2024-05-29: qty 16, 60d supply, fill #1

## 2024-04-15 NOTE — Progress Notes (Signed)
 BP 123/72   Pulse 73   Temp 98.4 F (36.9 C) (Oral)   Ht 5' 7 (1.702 m)   Wt 174 lb (78.9 kg)   LMP 06/20/2016 (Approximate)   SpO2 98%   BMI 27.25 kg/m    Subjective:    Patient ID: Jamie [name], female    DOB: April 22, 1971, 53 y.o.   MRN: [id number]  HPI: Jamie [name] is a 53 y.o. female presenting on 04/15/2024 for comprehensive medical examination. Current medical complaints include:none  Menopausal Symptoms: no  Depression Screen done today and results listed below:     04/15/2024    8:04 AM 02/19/2024    2:19 PM 04/15/2023   11:17 AM 01/18/2023    4:09 PM 02/23/2022    9:04 AM  Depression screen PHQ 2/9  Decreased Interest 2 2 2 2 2   Down, Depressed, Hopeless 2 2 2 3 3   PHQ - 2 Score 4 4 4 5 5   Altered sleeping 3 3 3 3 3   Tired, decreased energy 1 2 1 2 1   Change in appetite 1 2 2 3 3   Feeling bad or failure about yourself  2 2 2 3 3   Trouble concentrating 3 3 3 3 3   Moving slowly or fidgety/restless 3 3 3 3 3   Suicidal thoughts 0 0 0 0 0  PHQ-9 Score 17 19 18 22 21   Difficult doing work/chores Somewhat difficult Somewhat difficult Somewhat difficult Somewhat difficult Very difficult    Past Medical History:  Past Medical History:  Diagnosis Date   Allergic rhinitis    Allergy 1980   Anxiety    takes Ativan  daily.  Panic Attack   Arthritis    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, unspecified (HCC)    At risk for long QT syndrome    Chronic back pain    herniated disc/stenosis/scoliosis   DDD (degenerative disc disease), lumbar    Depression    takes Effexor  daily   Displacement of lumbar intervertebral disc without myelopathy    Eczema    uses a cream daily as needed   Family history of adverse reaction to anesthesia    pta dad is very hard to wake up;excessive nausea   GAD (generalized anxiety disorder)    Generalized body aches    History of bronchitis    > 94yr ago   Hx of dysplastic nevus 05/10/2020   neck - posterior, Severe Atypia. Shave removal 07/18/2020,  margins free   Hypoglycemia    Insomnia    takes Lorazepam  nightly   Joint pain    Panic attacks    PONV (postoperative nausea and vomiting)    Primary osteoarthritis of right knee    Pseudoarthrosis of lumbar spine    Recurrent major depression-severe (HCC)    Seasonal allergies    uses Flonase  daily   Spinal stenosis of lumbar region    Spondylolisthesis    Torn rotator cuff    Vitamin D  deficiency    takes Vit D daily   Wears contact lenses     Surgical History:  Past Surgical History:  Procedure Laterality Date   APPLICATION OF INTRAOPERATIVE CT SCAN N/A 03/11/2023   Procedure: APPLICATION OF INTRAOPERATIVE CT SCAN;  Surgeon: Clois Fret, MD;  Location: [facility];  Service: Neurosurgery;  Laterality: N/A;   BACK SURGERY  2008/2012, x2 in 2016   laminectomy 1st time and 2nd time laminectomy and fusion   COLONOSCOPY WITH PROPOFOL  N/A 05/27/2020   Procedure: COLONOSCOPY WITH PROPOFOL ;  Surgeon: Tahiliani, Varnita  B, MD;  Location: [facility];  Service: Gastroenterology;  Laterality: N/A;  COVID POSITIVE AUGUST 16   MAXIMUM ACCESS (MAS)POSTERIOR LUMBAR INTERBODY FUSION (PLIF) 2 LEVEL N/A 09/01/2014   Procedure: Lumbar four-five, Lumbar five-Sacral one Maximum Access Surgery posterior lumbar interbody fusion with interbody prosthesis posterior lateral arthrodesis posterior segmental instrumentation;  Surgeon: Arley SHAUNNA Helling, MD;  Location: [facility];  Service: Neurosurgery;  Laterality: N/A;  Lumbar four-five, Lumbar five-Sacral one Maximum Access Surgery posterior lumbar interbody fusion with i   SHOULDER ARTHROSCOPY WITH SUBACROMIAL DECOMPRESSION AND OPEN ROTATOR C Right 08/18/2021   Procedure: Right shoulder arthroscopic rotator cuff repair, biceps tenodesis, subacromial decompression;  Surgeon: Tobie Priest, MD;  Location: [facility];  Service: Orthopedics;  Laterality: Right;   SPINE SURGERY  X 5   Last 03/11/23    Medications:  Current Outpatient  Medications on File Prior to Visit  Medication Sig   celecoxib  (CELEBREX ) 200 MG capsule Take 1 capsule (200 mg total) by mouth daily.   cholecalciferol  (VITAMIN D ) 1000 UNITS tablet Take 1,000 Units by mouth daily.   clobetasol  cream (TEMOVATE ) 0.05 % Apply 1 Application topically as directed once daily to affected areas of  hands and feet as needed for flares, avoid face, groin, axilla.   Dupilumab  (DUPIXENT ) 300 MG/2ML SOAJ Inject 300 mg into the skin every 14 (fourteen) days.   gabapentin  (NEURONTIN ) 600 MG tablet Take 1 tablet (600 mg total) by mouth 3 (three) times daily.   Halobetasol  Prop-Tazarotene (DUOBRII ) 0.01-0.045 % LOTN Apply 1 application topically at bedtime. qhs to aa rash on hands and feet until clear, then prn flares, avoid face, groin, axilla (Patient taking differently: Apply 1 application  topically at bedtime as needed. qhs to aa rash on hands and feet until clear, then prn flares, avoid face, groin, axilla)   lidocaine  (XYLOCAINE ) 2 % solution Swish and gargle 5 mLs in the mouth or throat every 3 (three) hours as needed for mild pain for up to 10 days. Retain for 1 minute and spit.   methocarbamol  (ROBAXIN ) 500 MG tablet Take 1 tablet (500 mg total) by mouth 3 (three) times daily.   morphine  (MS CONTIN ) 30 MG 12 hr tablet Take 1 tablet (30 mg total) by mouth daily.   Multiple Vitamin (MULTIVITAMIN ADULT PO) Take by mouth daily.   naloxone  (NARCAN ) nasal spray 4 mg/0.1 mL Place 1 spray into the nose every 2 minutes as needed for opioid overdose; spray 1 dose into ONE nostril; alternate nostrils w each dose until help arrives   Oxycodone  HCl 10 MG TABS Take 1 tablet (10 mg total) by mouth every 6 (six) hours.   rizatriptan  (MAXALT ) 10 MG tablet Take 1 tablet (10 mg total) by mouth as needed for migraine. May repeat in 2 hours if needed   tacrolimus  (PROTOPIC ) 0.1 % ointment Apply topically daily at bedtime to hands and feet   Tapinarof  (VTAMA ) 1 % CREA Apply once daily to aa  hands, feet   Tavaborole  5 % SOLN Apply qhs to affected toenails   No current facility-administered medications on file prior to visit.    Allergies:  Allergies  Allergen Reactions   Amoxicillin  Diarrhea, Nausea And Vomiting and Shortness Of Breath   Shellfish Allergy Hives   Flexeril  [Cyclobenzaprine ] Hives    RAPID HEARTBEAT   Wound Dressing Adhesive Hives and Itching    Social History:  Social History   Socioeconomic History   Marital status: Divorced    Spouse name: Not  on file   Number of children: Not on file   Years of education: Not on file   Highest education level: Bachelor's degree (e.g., BA, AB, BS)  Occupational History   Not on file  Tobacco Use   Smoking status: Former    Current packs/day: 0.00    Average packs/day: 0.5 packs/day for 20.0 years (10.0 ttl pk-yrs)    Types: Cigarettes    Start date: 10/18/1996    Quit date: 10/18/2016    Years since quitting: 7.4   Smokeless tobacco: Never   Tobacco comments:    Already quit  Vaping Use   Vaping status: Never Used  Substance and Sexual Activity   Alcohol use: No   Drug use: No   Sexual activity: Not Currently    Birth control/protection: None  Other Topics Concern   Not on file  Social History Narrative   Not on file   Social Drivers of Health   Financial Resource Strain: Low Risk  (02/18/2024)   Overall Financial Resource Strain (CARDIA)    Difficulty of Paying Living Expenses: Not hard at all  Food Insecurity: No Food Insecurity (02/18/2024)   Hunger Vital Sign    Worried About Running Out of Food in the Last Year: Never true    Ran Out of Food in the Last Year: Never true  Transportation Needs: No Transportation Needs (02/18/2024)   PRAPARE - Administrator, Civil Service (Medical): No    Lack of Transportation (Non-Medical): No  Physical Activity: Insufficiently Active (02/18/2024)   Exercise Vital Sign    Days of Exercise per Week: 3 days    Minutes of Exercise per Session: 30 min   Stress: Stress Concern Present (02/18/2024)   [employer] of Occupational Health - Occupational Stress Questionnaire    Feeling of Stress: Rather much  Social Connections: Moderately Integrated (02/18/2024)   Social Connection and Isolation Panel    Frequency of Communication with Friends and Family: More than three times a week    Frequency of Social Gatherings with Friends and Family: Once a week    Attends Religious Services: More than 4 times per year    Active Member of [employer] or Organizations: Yes    Attends Engineer, structural: More than 4 times per year    Marital Status: Divorced  Intimate Partner Violence: Not At Risk (04/15/2024)   Humiliation, Afraid, Rape, and Kick questionnaire    Fear of Current or Ex-Partner: No    Emotionally Abused: No    Physically Abused: No    Sexually Abused: No   Social History   Tobacco Use  Smoking Status Former   Current packs/day: 0.00   Average packs/day: 0.5 packs/day for 20.0 years (10.0 ttl pk-yrs)   Types: Cigarettes   Start date: 10/18/1996   Quit date: 10/18/2016   Years since quitting: 7.4  Smokeless Tobacco Never  Tobacco Comments   Already quit   Social History   Substance and Sexual Activity  Alcohol Use No    Family History:  Family History  Problem Relation Age of Onset   Arthritis Mother    Hyperlipidemia Mother    Migraines Mother    Arthritis Father    Diabetes Father    Heart disease Father    Atrial fibrillation Father    Diabetes Brother    Breast cancer Maternal Grandmother    Mental illness Neg Hx     Past medical history, surgical history, medications, allergies, family history and  social history reviewed with patient today and changes made to appropriate areas of the chart.   Review of Systems  Constitutional: Negative.   HENT: Negative.    Eyes: Negative.   Respiratory: Negative.    Cardiovascular: Negative.   Gastrointestinal: Negative.   Genitourinary: Negative.    Musculoskeletal: Negative.   Skin: Negative.   Neurological: Negative.   Endo/Heme/Allergies: Negative.   Psychiatric/Behavioral:  Positive for depression. Negative for hallucinations, memory loss, substance abuse and suicidal ideas. The patient is not nervous/anxious and does not have insomnia.    All other ROS negative except what is listed above and in the HPI.      Objective:    BP 123/72   Pulse 73   Temp 98.4 F (36.9 C) (Oral)   Ht 5' 7 (1.702 m)   Wt 174 lb (78.9 kg)   LMP 06/20/2016 (Approximate)   SpO2 98%   BMI 27.25 kg/m   Wt Readings from Last 3 Encounters:  04/15/24 174 lb (78.9 kg)  02/19/24 177 lb 9.6 oz (80.6 kg)  01/09/24 173 lb (78.5 kg)    Physical Exam Vitals and nursing note reviewed.  Constitutional:      General: She is not in acute distress.    Appearance: Normal appearance. She is not ill-appearing, toxic-appearing or diaphoretic.  HENT:     Head: Normocephalic and atraumatic.     Right Ear: Tympanic membrane, ear canal and external ear normal. There is no impacted cerumen.     Left Ear: Tympanic membrane, ear canal and external ear normal. There is no impacted cerumen.     Nose: Nose normal. No congestion or rhinorrhea.     Mouth/Throat:     Mouth: Mucous membranes are moist.     Pharynx: Oropharynx is clear. No oropharyngeal exudate or posterior oropharyngeal erythema.  Eyes:     General: No scleral icterus.       Right eye: No discharge.        Left eye: No discharge.     Extraocular Movements: Extraocular movements intact.     Conjunctiva/sclera: Conjunctivae normal.     Pupils: Pupils are equal, round, and reactive to light.  Neck:     Vascular: No carotid bruit.  Cardiovascular:     Rate and Rhythm: Normal rate and regular rhythm.     Pulses: Normal pulses.     Heart sounds: No murmur heard.    No friction rub. No gallop.  Pulmonary:     Effort: Pulmonary effort is normal. No respiratory distress.     Breath sounds: Normal  breath sounds. No stridor. No wheezing, rhonchi or rales.  Chest:     Chest wall: No tenderness.  Abdominal:     General: Abdomen is flat. Bowel sounds are normal. There is no distension.     Palpations: Abdomen is soft. There is no mass.     Tenderness: There is no abdominal tenderness. There is no right CVA tenderness, left CVA tenderness, guarding or rebound.     Hernia: No hernia is present.  Genitourinary:    Comments: Breast and pelvic exams deferred with shared decision making Musculoskeletal:        General: No swelling, tenderness, deformity or signs of injury.     Cervical back: Normal range of motion and neck supple. No rigidity. No muscular tenderness.     Right lower leg: No edema.     Left lower leg: No edema.  Lymphadenopathy:     Cervical: No cervical  adenopathy.  Skin:    General: Skin is warm and dry.     Capillary Refill: Capillary refill takes less than 2 seconds.     Coloration: Skin is not jaundiced or pale.     Findings: No bruising, erythema, lesion or rash.  Neurological:     General: No focal deficit present.     Mental Status: She is alert and oriented to person, place, and time. Mental status is at baseline.     Cranial Nerves: No cranial nerve deficit.     Sensory: No sensory deficit.     Motor: No weakness.     Coordination: Coordination normal.     Gait: Gait normal.     Deep Tendon Reflexes: Reflexes normal.  Psychiatric:        Mood and Affect: Mood normal.        Behavior: Behavior normal.        Thought Content: Thought content normal.        Judgment: Judgment normal.     Results for orders placed or performed in visit on 04/15/23  Microscopic Examination   Collection Time: 04/15/23 11:30 AM   Urine  Result Value Ref Range   WBC, UA None seen 0 - 5 /hpf   RBC, Urine 0-2 0 - 2 /hpf   Epithelial Cells (non renal) 0-10 0 - 10 /hpf   Bacteria, UA None seen None seen/Few  Urinalysis, Routine w reflex microscopic   Collection Time:  04/15/23 11:30 AM  Result Value Ref Range   Specific Gravity, UA 1.010 1.005 - 1.030   pH, UA 5.5 5.0 - 7.5   Color, UA Yellow Yellow   Appearance Ur Clear Clear   Leukocytes,UA Negative Negative   Protein,UA Negative Negative/Trace   Glucose, UA Negative Negative   Ketones, UA Negative Negative   RBC, UA Trace (A) Negative   Bilirubin, UA Negative Negative   Urobilinogen, Ur 0.2 0.2 - 1.0 mg/dL   Nitrite, UA Negative Negative   Microscopic Examination See below:   CBC with Differential/Platelet   Collection Time: 04/15/23 11:35 AM  Result Value Ref Range   WBC 6.5 3.4 - 10.8 x10E3/uL   RBC 3.75 (L) 3.77 - 5.28 x10E6/uL   Hemoglobin 11.8 11.1 - 15.9 g/dL   Hematocrit 63.5 65.9 - 46.6 %   MCV 97 79 - 97 fL   MCH 31.5 26.6 - 33.0 pg   MCHC 32.4 31.5 - 35.7 g/dL   RDW 86.3 88.2 - 84.5 %   Platelets 247 150 - 450 x10E3/uL   Neutrophils 56 Not Estab. %   Lymphs 37 Not Estab. %   Monocytes 7 Not Estab. %   Eos 0 Not Estab. %   Basos 0 Not Estab. %   Neutrophils Absolute 3.6 1.4 - 7.0 x10E3/uL   Lymphocytes Absolute 2.4 0.7 - 3.1 x10E3/uL   Monocytes Absolute 0.4 0.1 - 0.9 x10E3/uL   EOS (ABSOLUTE) 0.0 0.0 - 0.4 x10E3/uL   Basophils Absolute 0.0 0.0 - 0.2 x10E3/uL   Immature Granulocytes 0 Not Estab. %   Immature Grans (Abs) 0.0 0.0 - 0.1 x10E3/uL  Comprehensive metabolic panel   Collection Time: 04/15/23 11:35 AM  Result Value Ref Range   Glucose 107 (H) 70 - 99 mg/dL   BUN 8 6 - 24 mg/dL   Creatinine, Ser 9.29 0.57 - 1.00 mg/dL   eGFR 896 >40 fO/fpw/8.26   BUN/Creatinine Ratio 11 9 - 23   Sodium 138 134 - 144 mmol/L  Potassium 4.2 3.5 - 5.2 mmol/L   Chloride 102 96 - 106 mmol/L   CO2 23 20 - 29 mmol/L   Calcium 9.4 8.7 - 10.2 mg/dL   Total Protein 6.6 6.0 - 8.5 g/dL   Albumin  4.2 3.8 - 4.9 g/dL   Globulin, Total 2.4 1.5 - 4.5 g/dL   Bilirubin Total 0.3 0.0 - 1.2 mg/dL   Alkaline Phosphatase 113 44 - 121 IU/L   AST 18 0 - 40 IU/L   ALT 14 0 - 32 IU/L  Lipid Panel  w/o Chol/HDL Ratio   Collection Time: 04/15/23 11:35 AM  Result Value Ref Range   Cholesterol, Total 220 (H) 100 - 199 mg/dL   Triglycerides 96 0 - 149 mg/dL   HDL 50 >60 mg/dL   VLDL Cholesterol Cal 17 5 - 40 mg/dL   LDL Chol Calc (NIH) 846 (H) 0 - 99 mg/dL  TSH   Collection Time: 04/15/23 11:35 AM  Result Value Ref Range   TSH 1.400 0.450 - 4.500 uIU/mL      Assessment & Plan:   Problem List Items Addressed This Visit   None Visit Diagnoses       Routine general medical examination at a health care facility    -  Primary   Vaccines up to date/declined. Screening labs checked today. Pap and colonoscopy up to date. Mammo ordered. Continue diet and exercise. Call with any concerns.   Relevant Orders   CBC with Differential/Platelet   Comprehensive metabolic panel with GFR   Lipid Panel w/o Chol/HDL Ratio   TSH   Hepatitis B surface antibody,quantitative     Encounter for screening mammogram for malignant neoplasm of breast       Mammo ordered today.   Relevant Orders   MM 3D SCREENING MAMMOGRAM BILATERAL BREAST        Follow up plan: Return in about 1 year (around 04/15/2025) for physical.   LABORATORY TESTING:  - Pap smear: up to date  IMMUNIZATIONS:   - Tdap: Tetanus vaccination status reviewed: last tetanus booster within 10 years. - Influenza: Given elsewhere - Prevnar: Refused - COVID: Refused - HPV: Not applicable - Shingrix  vaccine: Up to date  SCREENING: -Mammogram: Ordered today  - Colonoscopy: Up to date   PATIENT COUNSELING:   Advised to take 1 mg of folate supplement per day if capable of pregnancy.   Sexuality: Discussed sexually transmitted diseases, partner selection, use of condoms, avoidance of unintended pregnancy  and contraceptive alternatives.   Advised to avoid cigarette smoking.  I discussed with the patient that most people either abstain from alcohol or drink within safe limits (<=14/week and <=4 drinks/occasion for males, <=7/weeks  and <= 3 drinks/occasion for females) and that the risk for alcohol disorders and other health effects rises proportionally with the number of drinks per week and how often a drinker exceeds daily limits.  Discussed cessation/primary prevention of drug use and availability of treatment for abuse.   Diet: Encouraged to adjust caloric intake to maintain  or achieve ideal body weight, to reduce intake of dietary saturated fat and total fat, to limit sodium intake by avoiding high sodium foods and not adding table salt, and to maintain adequate dietary potassium and calcium preferably from fresh fruits, vegetables, and low-fat dairy products.    stressed the importance of regular exercise  Injury prevention: Discussed safety belts, safety helmets, smoke detector, smoking near bedding or upholstery.   Dental health: Discussed importance of regular tooth brushing, flossing,  and dental visits.    NEXT PREVENTATIVE PHYSICAL DUE IN 1 YEAR. Return in about 1 year (around 04/15/2025) for physical.

## 2024-04-16 ENCOUNTER — Ambulatory Visit: Payer: Self-pay | Admitting: Family Medicine

## 2024-04-16 LAB — CBC WITH DIFFERENTIAL/PLATELET
Basophils Absolute: 0 x10E3/uL (ref 0.0–0.2)
Basos: 0 %
EOS (ABSOLUTE): 0.1 x10E3/uL (ref 0.0–0.4)
Eos: 1 %
Hematocrit: 44.7 % (ref 34.0–46.6)
Hemoglobin: 14.2 g/dL (ref 11.1–15.9)
Immature Grans (Abs): 0 x10E3/uL (ref 0.0–0.1)
Immature Granulocytes: 0 %
Lymphocytes Absolute: 2.5 x10E3/uL (ref 0.7–3.1)
Lymphs: 38 %
MCH: 32 pg (ref 26.6–33.0)
MCHC: 31.8 g/dL (ref 31.5–35.7)
MCV: 101 fL — ABNORMAL HIGH (ref 79–97)
Monocytes Absolute: 0.4 x10E3/uL (ref 0.1–0.9)
Monocytes: 6 %
Neutrophils Absolute: 3.6 x10E3/uL (ref 1.4–7.0)
Neutrophils: 55 %
Platelets: 247 x10E3/uL (ref 150–450)
RBC: 4.44 x10E6/uL (ref 3.77–5.28)
RDW: 13.1 % (ref 11.7–15.4)
WBC: 6.5 x10E3/uL (ref 3.4–10.8)

## 2024-04-16 LAB — COMPREHENSIVE METABOLIC PANEL WITH GFR
ALT: 36 IU/L — ABNORMAL HIGH (ref 0–32)
AST: 20 IU/L (ref 0–40)
Albumin: 4.4 g/dL (ref 3.8–4.9)
Alkaline Phosphatase: 113 IU/L (ref 44–121)
BUN/Creatinine Ratio: 17 (ref 9–23)
BUN: 12 mg/dL (ref 6–24)
Bilirubin Total: 0.3 mg/dL (ref 0.0–1.2)
CO2: 23 mmol/L (ref 20–29)
Calcium: 9.7 mg/dL (ref 8.7–10.2)
Chloride: 105 mmol/L (ref 96–106)
Creatinine, Ser: 0.71 mg/dL (ref 0.57–1.00)
Globulin, Total: 2.6 g/dL (ref 1.5–4.5)
Glucose: 92 mg/dL (ref 70–99)
Potassium: 4.3 mmol/L (ref 3.5–5.2)
Sodium: 142 mmol/L (ref 134–144)
Total Protein: 7 g/dL (ref 6.0–8.5)
eGFR: 101 mL/min/1.73 (ref >59)

## 2024-04-16 LAB — LIPID PANEL W/O CHOL/HDL RATIO
Cholesterol, Total: 221 mg/dL — ABNORMAL HIGH (ref 100–199)
HDL: 45 mg/dL (ref >39)
LDL Chol Calc (NIH): 153 mg/dL — ABNORMAL HIGH (ref 0–99)
Triglycerides: 130 mg/dL (ref 0–149)
VLDL Cholesterol Cal: 23 mg/dL (ref 5–40)

## 2024-04-16 LAB — TSH: TSH: 1.71 u[IU]/mL (ref 0.450–4.500)

## 2024-04-16 LAB — HEPATITIS B SURFACE ANTIBODY, QUANTITATIVE: Hepatitis B Surf Ab Quant: <3.5 m[IU]/mL — ABNORMAL LOW

## 2024-04-17 ENCOUNTER — Other Ambulatory Visit: Payer: Self-pay

## 2024-04-21 ENCOUNTER — Other Ambulatory Visit: Payer: Self-pay

## 2024-05-05 ENCOUNTER — Other Ambulatory Visit: Payer: Self-pay

## 2024-05-05 ENCOUNTER — Encounter (INDEPENDENT_AMBULATORY_CARE_PROVIDER_SITE_OTHER): Payer: Self-pay

## 2024-05-05 ENCOUNTER — Other Ambulatory Visit: Payer: Self-pay | Admitting: Pharmacy Technician

## 2024-05-05 NOTE — Progress Notes (Signed)
 Specialty Pharmacy Refill Coordination Note  Kortni Hasten is a 53 y.o. female contacted today regarding refills of specialty medication(s) Dupilumab  (Dupixent )   Patient requested Delivery   Delivery date: 05/26/24   Verified address: [address] [place], [place] [address]   Medication will be filled on 05/25/24.  Injection due on 10/11 - patient has 1 injection due on 9/27.  Questionnaire answered.

## 2024-05-08 ENCOUNTER — Other Ambulatory Visit: Payer: Self-pay

## 2024-05-08 DIAGNOSIS — Z79899 Other long term (current) drug therapy: Secondary | ICD-10-CM | POA: Diagnosis not present

## 2024-05-08 MED ORDER — GABAPENTIN 600 MG PO TABS
600.0000 mg | ORAL_TABLET | Freq: Three times a day (TID) | ORAL | 0 refills | Status: DC
  Filled 2024-05-08: qty 90, 30d supply, fill #0

## 2024-05-08 MED ORDER — NALOXONE HCL 4 MG/0.1ML NA LIQD
1.0000 | NASAL | 0 refills | Status: DC
  Filled 2024-05-08: qty 2, 30d supply, fill #0

## 2024-05-08 MED ORDER — METHOCARBAMOL 500 MG PO TABS
500.0000 mg | ORAL_TABLET | Freq: Three times a day (TID) | ORAL | 0 refills | Status: DC
  Filled 2024-05-08: qty 90, 30d supply, fill #0

## 2024-05-08 MED ORDER — MORPHINE SULFATE ER 30 MG PO TBCR
30.0000 mg | EXTENDED_RELEASE_TABLET | Freq: Every day | ORAL | 0 refills | Status: DC
  Filled 2024-05-08 – 2024-05-29 (×2): qty 30, 30d supply, fill #0

## 2024-05-08 MED ORDER — OXYCODONE HCL 10 MG PO TABS
10.0000 mg | ORAL_TABLET | Freq: Four times a day (QID) | ORAL | 0 refills | Status: DC
  Filled 2024-05-15: qty 120, 30d supply, fill #0

## 2024-05-11 ENCOUNTER — Other Ambulatory Visit: Payer: Self-pay

## 2024-05-15 ENCOUNTER — Other Ambulatory Visit: Payer: Self-pay

## 2024-05-18 NOTE — Progress Notes (Addendum)
 Referring Physician:  Vicci Duwaine SQUIBB, DO [address] [place],  [place] [address]  Primary Physician:  Vicci Duwaine SQUIBB, DO  History of Present Illness: Jamie [name] has a history of eczema, GAD, panic attack, depression.   She is s/p PSF L4-L5 for pseudoarthrosis on 03/11/23. She was last seen on 01/09/24 and was doing well.   Now with 2 month history of constant LBP with left buttock pain that wraps around her hip and then is anterior to her left foot. She has has minimal right leg pain. Pain feels like it did prior to surgery. No new injury. Pain is worse with prolonged standing/sitting. Pain is better with changing positions. She has numbness, tingling, and weakness in left > right leg.    She is taking robaxin , oxycodone  10mg , and MS Contin  from pain management ([facility]).   Tobacco use:  Does not smoke.   Bowel/Bladder Dysfunction: none  Conservative measures:  Physical therapy: nothing recent  Multimodal medical therapy including regular antiinflammatories: oxycodone , MS Contin , robaxin   Injections:  No epidural steroid injections  Past Surgery:  PSF L4-L5 for pseudoarthrosis on 03/11/23 by Dr. Clois  The symptoms are causing a significant impact on the patient's life.   Review of Systems:  A 10 point review of systems is negative, except for the pertinent positives and negatives detailed in the HPI.  Past Medical History: Past Medical History:  Diagnosis Date   Allergic rhinitis    Allergy 1980   Anxiety    takes Ativan  daily.  Panic Attack   Arthritis    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, unspecified (HCC)    At risk for long QT syndrome    Chronic back pain    herniated disc/stenosis/scoliosis   DDD (degenerative disc disease), lumbar    Depression    takes Effexor  daily   Displacement of lumbar intervertebral disc without myelopathy    Eczema    uses a cream daily as needed   Family history of adverse reaction to anesthesia    pta dad is very  hard to wake up;excessive nausea   GAD (generalized anxiety disorder)    Generalized body aches    History of bronchitis    > 23yr ago   Hx of dysplastic nevus 05/10/2020   neck - posterior, Severe Atypia. Shave removal 07/18/2020, margins free   Hypoglycemia    Insomnia    takes Lorazepam  nightly   Joint pain    Panic attacks    PONV (postoperative nausea and vomiting)    Primary osteoarthritis of right knee    Pseudoarthrosis of lumbar spine    Recurrent major depression-severe (HCC)    Seasonal allergies    uses Flonase  daily   Spinal stenosis of lumbar region    Spondylolisthesis    Torn rotator cuff    Vitamin D  deficiency    takes Vit D daily   Wears contact lenses     Past Surgical History: Past Surgical History:  Procedure Laterality Date   APPLICATION OF INTRAOPERATIVE CT SCAN N/A 03/11/2023   Procedure: APPLICATION OF INTRAOPERATIVE CT SCAN;  Surgeon: Clois Fret, MD;  Location: [facility]S;  Service: Neurosurgery;  Laterality: N/A;   BACK SURGERY  2008/2012, x2 in 2016   laminectomy 1st time and 2nd time laminectomy and fusion   COLONOSCOPY WITH PROPOFOL  N/A 05/27/2020   Procedure: COLONOSCOPY WITH PROPOFOL ;  Surgeon: Janalyn Keene NOVAK, MD;  Location: [facility];  Service: Gastroenterology;  Laterality: N/A;  COVID POSITIVE AUGUST 16  MAXIMUM ACCESS (MAS)POSTERIOR LUMBAR INTERBODY FUSION (PLIF) 2 LEVEL N/A 09/01/2014   Procedure: Lumbar four-five, Lumbar five-Sacral one Maximum Access Surgery posterior lumbar interbody fusion with interbody prosthesis posterior lateral arthrodesis posterior segmental instrumentation;  Surgeon: Arley SHAUNNA Helling, MD;  Location: [facility];  Service: Neurosurgery;  Laterality: N/A;  Lumbar four-five, Lumbar five-Sacral one Maximum Access Surgery posterior lumbar interbody fusion with i   SHOULDER ARTHROSCOPY WITH SUBACROMIAL DECOMPRESSION AND OPEN ROTATOR C Right 08/18/2021   Procedure: Right shoulder arthroscopic rotator cuff  repair, biceps tenodesis, subacromial decompression;  Surgeon: Tobie Priest, MD;  Location: [facility];  Service: Orthopedics;  Laterality: Right;   SPINE SURGERY  X 5   Last 03/11/23    Allergies: Allergies as of 05/19/2024 - Review Complete 05/19/2024  Allergen Reaction Noted   Amoxicillin  Diarrhea, Nausea And Vomiting, and Shortness Of Breath 08/23/2021   Shellfish allergy Hives 08/18/2014   Flexeril  [cyclobenzaprine ] Hives 08/18/2014   Wound dressing adhesive Hives and Itching 03/11/2023    Medications: Outpatient Encounter Medications as of 05/19/2024  Medication Sig   cholecalciferol  (VITAMIN D ) 1000 UNITS tablet Take 1,000 Units by mouth daily.   clobetasol  cream (TEMOVATE ) 0.05 % Apply 1 Application topically as directed once daily to affected areas of  hands and feet as needed for flares, avoid face, groin, axilla.   Dupilumab  (DUPIXENT ) 300 MG/2ML SOAJ Inject 300 mg into the skin every 14 (fourteen) days.   fluticasone  (FLONASE ) 50 MCG/ACT nasal spray Place 1 spray into both nostrils daily as needed for allergies.   Halobetasol  Prop-Tazarotene (DUOBRII ) 0.01-0.045 % LOTN Apply 1 application topically at bedtime. qhs to aa rash on hands and feet until clear, then prn flares, avoid face, groin, axilla (Patient taking differently: Apply 1 application  topically at bedtime as needed. qhs to aa rash on hands and feet until clear, then prn flares, avoid face, groin, axilla)   methocarbamol  (ROBAXIN ) 500 MG tablet Take 1 tablet (500 mg total) by mouth 3 (three) times daily.   montelukast  (SINGULAIR ) 10 MG tablet Take 1 tablet (10 mg total) by mouth at bedtime.   morphine  (MS CONTIN ) 30 MG 12 hr tablet Take 1 tablet (30 mg total) by mouth daily.   Multiple Vitamin (MULTIVITAMIN ADULT PO) Take by mouth daily.   Oxycodone  HCl 10 MG TABS Take 1 tablet (10 mg total) by mouth every 6 (six) hours.   rizatriptan  (MAXALT ) 10 MG tablet Take 1 tablet (10 mg total) by mouth as needed for  migraine. May repeat in 2 hours if needed   tacrolimus  (PROTOPIC ) 0.1 % ointment Apply topically daily at bedtime to hands and feet   Tapinarof  (VTAMA ) 1 % CREA Apply once daily to aa hands, feet   Tavaborole  5 % SOLN Apply qhs to affected toenails   [DISCONTINUED] celecoxib  (CELEBREX ) 200 MG capsule Take 1 capsule (200 mg total) by mouth daily.   [DISCONTINUED] gabapentin  (NEURONTIN ) 600 MG tablet Take 1 tablet (600 mg total) by mouth 3 (three) times daily.   [DISCONTINUED] gabapentin  (NEURONTIN ) 600 MG tablet Take 1 tablet (600 mg total) by mouth 3 (three) times daily.   [DISCONTINUED] lidocaine  (XYLOCAINE ) 2 % solution Swish and gargle 5 mLs in the mouth or throat every 3 (three) hours as needed for mild pain for up to 10 days. Retain for 1 minute and spit.   [DISCONTINUED] naloxone  (NARCAN ) nasal spray 4 mg/0.1 mL Place 1 spray into the nose every 2 minutes as needed for opioid overdose; spray 1 dose into  ONE nostril; alternate nostrils w each dose until help arrives   [DISCONTINUED] naloxone  (NARCAN ) nasal spray 4 mg/0.1 mL 1 spray every 2 minutes as needed for opioid overdose; spray 1 dose into ONE nostril; alternate nostrils w each dose until help arrives   No facility-administered encounter medications on file as of 05/19/2024.    Social History: Social History   Tobacco Use   Smoking status: Former    Current packs/day: 0.00    Average packs/day: 0.5 packs/day for 20.0 years (10.0 ttl pk-yrs)    Types: Cigarettes    Start date: 10/18/1996    Quit date: 10/18/2016    Years since quitting: 7.5   Smokeless tobacco: Never   Tobacco comments:    Already quit  Vaping Use   Vaping status: Never Used  Substance Use Topics   Alcohol use: No   Drug use: No    Family Medical History: Family History  Problem Relation Age of Onset   Arthritis Mother    Hyperlipidemia Mother    Migraines Mother    Arthritis Father    Diabetes Father    Heart disease Father    Atrial fibrillation  Father    Diabetes Brother    Breast cancer Maternal Grandmother    Mental illness Neg Hx     Physical Examination: Vitals:   05/19/24 1032  BP: 128/80      Awake, alert, oriented to person, place, and time.  Speech is clear and fluent. Fund of knowledge is appropriate.   Cranial Nerves: Pupils equal round and reactive to light.  Facial tone is symmetric.    Well healed lumbar incisions. No posterior lumbar tenderness.   No abnormal lesions on exposed skin.   Strength: Side Iliopsoas Quads Hamstring PF DF EHL  R 5 5 5 5 5 5   L 5 5 5 5 5 5    Reflexes are 2+ and symmetric at the patella and achilles.    Clonus is not present.   Bilateral lower extremity sensation is intact to light touch, but diminished in left lateral thigh (chronic per patient).   No pain with IR/ER right hip. Mild lateral hip pain with IR/ER left hip, no groin pain. No tenderness over greater trochanteric bursa bilaterally.   Gait is normal.    Medical Decision Making  Imaging: No recent lumbar xrays.   Assessment and Plan: Jamie [name] is s/p PSF L4-L5 for pseudoarthrosis on 03/11/23. She was last seen on 01/09/24 and was doing well.   Now with 2 month history of constant LBP with left buttock pain that wraps around her hip and then is anterior to her left foot. She has has minimal right leg pain. Pain feels like it did prior to surgery. No new injury. She has numbness, tingling, and weakness in left > right leg.    No updated lumbar imaging.   Treatment options discussed with patient and following plan made:   - MRI of lumbar spine to evaluate worsening back and left leg pain with history of lumbar surgery.  - Will get lumbar xrays with flex/ext when she gets MRI.  - Discussed medrol  dose pack to help with symptoms and she declines.  - Continue with pain medications from [facility].  - Follow up with me in clinic to review imaging results once I have them back.   I spent a total of 30  minutes in face-to-face and non-face-to-face activities related to this patient's care today including review of outside records, review  of imaging, review of symptoms, physical exam, discussion of differential diagnosis, discussion of treatment options, and documentation.   Glade Boys PA-C Dept. of Neurosurgery

## 2024-05-19 ENCOUNTER — Ambulatory Visit (INDEPENDENT_AMBULATORY_CARE_PROVIDER_SITE_OTHER): Admitting: Orthopedic Surgery

## 2024-05-19 ENCOUNTER — Encounter: Payer: Self-pay | Admitting: Orthopedic Surgery

## 2024-05-19 VITALS — BP 128/80 | Ht 67.0 in | Wt 174.0 lb

## 2024-05-19 DIAGNOSIS — M5416 Radiculopathy, lumbar region: Secondary | ICD-10-CM | POA: Diagnosis not present

## 2024-05-19 DIAGNOSIS — Z981 Arthrodesis status: Secondary | ICD-10-CM

## 2024-05-19 NOTE — Patient Instructions (Signed)
 It was so nice to see you today. Thank you so much for coming in.    I want to get an MRI of your lower back along with xrays to look into things further. We will get this approved through your insurance and [facility] will call you to schedule the appointment. Ask about your patient responsibility. You do not need to pay this prior to getting MRI, they can bill you.   [facility] (building with the white pillars) is located off of [address]. The address is [address] [place], [place] [address].    After you have the MRI and xrays, it can take 14-28 days for me to get the results back. If I don't have them in 2 weeks, we will call to try to get the results.   Once I have the results, we will call you to schedule a follow up visit with me to review them.   Continue on your medications from pain management.   Please do not hesitate to call if you have any questions or concerns. You can also message me in MyChart.   Glade Boys PA-C [phone]     The physicians and staff at [facility] at [facility] are committed to providing excellent care. You may receive a survey asking for feedback about your experience at our office. We value you your feedback and appreciate you taking the time to to fill it out. The [facility] leadership team is also available to discuss your experience in person, feel free to contact us  [phone].

## 2024-05-20 ENCOUNTER — Telehealth: Payer: Self-pay

## 2024-05-20 NOTE — Telephone Encounter (Addendum)
 Patient's insurance is no longer covering vtama  1% cream due to being on dual therapy while also on Dupixent  injections. However, patient is receiving vtama  prescription from [facility].   Please advise

## 2024-05-27 ENCOUNTER — Ambulatory Visit: Attending: Family Medicine | Admitting: Pharmacist

## 2024-05-27 ENCOUNTER — Telehealth: Payer: Self-pay | Admitting: Pharmacist

## 2024-05-27 DIAGNOSIS — Z79899 Other long term (current) drug therapy: Secondary | ICD-10-CM

## 2024-05-27 NOTE — Progress Notes (Signed)
   S: Patient presents for review of their specialty medication therapy.  Patient is currently taking Dupixent  for atopic dermatitis. Patient is managed by Dr. Jackquline for this.   Adherence: confirms. Taking 300 mg subq once every 14 days.  Efficacy: tells me that it's working well for her.  Dosing:  300 mg q2weeks  Dose adjustments: Renal: no dose adjustments (has not been studied) Hepatic: no dose adjustments (has not been studied)  Drug-drug interactions: none  Screening: TB test: completed  Monitoring: S/sx of infection: none S/sx of hypersensitivity: none S/sx of ocular effects: none S/sx of eosinophilia/vasculitis: none  O:  Lab Results  Component Value Date   WBC 6.5 04/15/2024   HGB 14.2 04/15/2024   HCT 44.7 04/15/2024   MCV 101 (H) 04/15/2024   PLT 247 04/15/2024      Chemistry      Component Value Date/Time   NA 142 04/15/2024 0838   K 4.3 04/15/2024 0838   CL 105 04/15/2024 0838   CO2 23 04/15/2024 0838   BUN 12 04/15/2024 0838   CREATININE 0.71 04/15/2024 0838      Component Value Date/Time   CALCIUM 9.7 04/15/2024 0838   ALKPHOS 113 04/15/2024 0838   AST 20 04/15/2024 0838   ALT 36 (H) 04/15/2024 0838   BILITOT 0.3 04/15/2024 0838       A/P: 1. Medication review: Patient currently on Dupixent  for atopic dermatitis. Reviewed the medication with the patient, including the following: Dupixent  is a monoclonal antibody used for the treatment of asthma or atopic dermatitis. Patient educated on purpose, proper use and potential adverse effects of Dupixent . Possible adverse effects include increased risk of infection, ocular effects, vasculitis/eosinophilia, and hypersensitivity reactions. Administer as a SubQ injection and rotate sites. Allow the medication to reach room temp prior to administration (45 mins for 300 mg syringe or 30 min for 200 mg syringe). Do not shake. Discard any unused portion. No recommendations for any changes.   Herlene Fleeta Morris, PharmD, JAQUELINE, CPP Clinical Pharmacist [facility] [phone]

## 2024-05-27 NOTE — Telephone Encounter (Signed)
 Called patient to schedule an appointment for the [facility] [employer] [facility] Special[facility]ication [facility]. I was unable to reach the patient so I left a HIPAA-compliant message requesting that the patient return my call.   Jamie [name], PharmD, JAQUELINE, CPP Clinical Pharmacist [facility] [phone]

## 2024-05-29 ENCOUNTER — Other Ambulatory Visit: Payer: Self-pay

## 2024-05-31 ENCOUNTER — Other Ambulatory Visit: Payer: Self-pay

## 2024-06-01 ENCOUNTER — Other Ambulatory Visit: Payer: Self-pay

## 2024-06-03 ENCOUNTER — Ambulatory Visit

## 2024-06-09 ENCOUNTER — Other Ambulatory Visit: Payer: Self-pay

## 2024-06-09 DIAGNOSIS — Z79899 Other long term (current) drug therapy: Secondary | ICD-10-CM | POA: Diagnosis not present

## 2024-06-09 MED ORDER — OXYCODONE HCL 10 MG PO TABS
10.0000 mg | ORAL_TABLET | Freq: Four times a day (QID) | ORAL | 0 refills | Status: DC
  Filled 2024-06-12: qty 120, 30d supply, fill #0

## 2024-06-09 MED ORDER — METHOCARBAMOL 500 MG PO TABS
500.0000 mg | ORAL_TABLET | Freq: Three times a day (TID) | ORAL | 0 refills | Status: DC
  Filled 2024-06-09: qty 90, 30d supply, fill #0

## 2024-06-09 MED ORDER — GABAPENTIN 600 MG PO TABS
600.0000 mg | ORAL_TABLET | Freq: Three times a day (TID) | ORAL | 0 refills | Status: DC
  Filled 2024-06-09: qty 90, 30d supply, fill #0

## 2024-06-09 MED ORDER — MORPHINE SULFATE ER 30 MG PO TBCR: 30.0000 mg | EXTENDED_RELEASE_TABLET | Freq: Every day | ORAL | 0 refills | Status: DC

## 2024-06-09 MED ORDER — NALOXONE HCL 4 MG/0.1ML NA LIQD
1.0000 | NASAL | 0 refills | Status: DC | PRN
  Filled 2024-06-09: qty 2, 30d supply, fill #0

## 2024-06-12 ENCOUNTER — Other Ambulatory Visit: Payer: Self-pay

## 2024-06-15 ENCOUNTER — Other Ambulatory Visit: Payer: Self-pay

## 2024-06-16 ENCOUNTER — Other Ambulatory Visit: Payer: Self-pay

## 2024-06-16 NOTE — Progress Notes (Signed)
 Specialty Pharmacy Refill Coordination Note  Jamie [name] is a 53 y.o. female contacted today regarding refills of specialty medication(s) Dupilumab  (Dupixent )   Patient requested Delivery   Delivery date: 06/24/24   Verified address: [address] [place], [place] [address]   Medication will be filled on: 06/23/24

## 2024-06-17 ENCOUNTER — Other Ambulatory Visit: Payer: Self-pay

## 2024-06-17 ENCOUNTER — Ambulatory Visit
Admission: RE | Admit: 2024-06-17 | Discharge: 2024-06-17 | Disposition: A | Discharge status: Home / Self Care | Source: Ambulatory Visit | Attending: Orthopedic Surgery | Admitting: Orthopedic Surgery

## 2024-06-17 DIAGNOSIS — M5416 Radiculopathy, lumbar region: Secondary | ICD-10-CM | POA: Diagnosis not present

## 2024-06-17 DIAGNOSIS — Z981 Arthrodesis status: Secondary | ICD-10-CM | POA: Diagnosis not present

## 2024-06-17 DIAGNOSIS — M5126 Other intervertebral disc displacement, lumbar region: Secondary | ICD-10-CM | POA: Diagnosis not present

## 2024-06-17 DIAGNOSIS — M79605 Pain in left leg: Secondary | ICD-10-CM | POA: Diagnosis not present

## 2024-06-17 DIAGNOSIS — M79604 Pain in right leg: Secondary | ICD-10-CM | POA: Diagnosis not present

## 2024-06-17 DIAGNOSIS — M48061 Spinal stenosis, lumbar region without neurogenic claudication: Secondary | ICD-10-CM | POA: Diagnosis not present

## 2024-06-17 MED ORDER — METHOCARBAMOL 500 MG PO TABS: 500.0000 mg | ORAL_TABLET | Freq: Three times a day (TID) | ORAL | 0 refills | Status: DC

## 2024-06-17 MED ORDER — GABAPENTIN 600 MG PO TABS
600.0000 mg | ORAL_TABLET | Freq: Three times a day (TID) | ORAL | 0 refills | Status: DC
  Filled 2024-06-17: qty 90, 30d supply, fill #0

## 2024-06-18 ENCOUNTER — Telehealth: Payer: Self-pay | Admitting: Orthopedic Surgery

## 2024-06-18 NOTE — Telephone Encounter (Signed)
 She had lumbar MRI but did not get her xrays done.   Please schedule f/u appointment with me in clinic to review MRI and she can get her xrays done here prior to her visit. They are ordered.   Thanks.

## 2024-06-18 NOTE — Telephone Encounter (Signed)
 Voicemail full.

## 2024-06-23 ENCOUNTER — Other Ambulatory Visit: Payer: Self-pay

## 2024-06-23 ENCOUNTER — Other Ambulatory Visit (HOSPITAL_COMMUNITY): Payer: Self-pay

## 2024-06-23 NOTE — Progress Notes (Signed)
 Benefits Investigation Started  Reason: Patient is not covered. No insurance found via eligibility.  Routed to: [employer]

## 2024-06-24 ENCOUNTER — Other Ambulatory Visit (HOSPITAL_COMMUNITY): Payer: Self-pay

## 2024-06-25 ENCOUNTER — Other Ambulatory Visit (HOSPITAL_COMMUNITY): Payer: Self-pay

## 2024-06-25 ENCOUNTER — Other Ambulatory Visit: Payer: Self-pay

## 2024-06-25 NOTE — Progress Notes (Signed)
 Tiffany was unable to locate any active insurance coverage for the patient and has left a voicemail requesting updated information. I sent the patient a MyChart message to follow up

## 2024-06-25 NOTE — Progress Notes (Signed)
 Patient is aware and is working with [employer] to get her benefits working.

## 2024-06-26 ENCOUNTER — Other Ambulatory Visit: Payer: Self-pay

## 2024-06-29 ENCOUNTER — Other Ambulatory Visit: Payer: Self-pay

## 2024-07-06 ENCOUNTER — Ambulatory Visit
Admission: RE | Admit: 2024-07-06 | Discharge: 2024-07-06 | Disposition: A | Discharge status: Home / Self Care | Source: Ambulatory Visit | Attending: Family Medicine | Admitting: Family Medicine

## 2024-07-06 DIAGNOSIS — Z1231 Encounter for screening mammogram for malignant neoplasm of breast: Secondary | ICD-10-CM | POA: Insufficient documentation

## 2024-07-06 NOTE — Progress Notes (Addendum)
 Referring Physician:  Vicci Duwaine SQUIBB, DO [address] [place],  [place] [address]  Primary Physician:  Vicci Duwaine SQUIBB, DO  History of Present Illness: [name] has a history of eczema, GAD, panic attack, depression.   She is s/p PSF L4-L5 for pseudoarthrosis on 03/11/23. Last saw me on 05/19/24 with 2 months of constant LBP with left hip and leg pain.   Lumbar MRI and xrays were ordered. She is here to review them. She was to continue with pain medications from [facility].   She continues with constant LBP with left buttock pain that wraps around her hip and then is anterior to her left foot. She is having a bad day today and pain is more intense. No significant right leg pain. Pain feels like it did prior to surgery. No new injury. Pain is worse with prolonged standing/sitting. Pain is better with changing positions. She has numbness, tingling, and weakness in left > right leg.    She is taking robaxin , oxycodone  10mg , and MS Contin  from pain management ([facility]).   She lives with her parents and helps care for them. Her mom was recently diagnosed with dementia.   Tobacco use:  Does not smoke.   Conservative measures:  Physical therapy: nothing recent  Multimodal medical therapy including regular antiinflammatories: oxycodone , MS Contin , robaxin   Injections:  No epidural steroid injections  Past Surgery:  PSF L4-L5 for pseudoarthrosis on 03/11/23 by Dr. Clois  The symptoms are causing a significant impact on the patient's life.   Review of Systems:  A 10 point review of systems is negative, except for the pertinent positives and negatives detailed in the HPI.  Past Medical History: Past Medical History:  Diagnosis Date   Allergic rhinitis    Allergy 1980   Anxiety    takes Ativan  daily.  Panic Attack   Arthritis    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, unspecified (HCC)    At risk for long QT syndrome    Chronic back pain    herniated  disc/stenosis/scoliosis   DDD (degenerative disc disease), lumbar    Depression    takes Effexor  daily   Displacement of lumbar intervertebral disc without myelopathy    Eczema    uses a cream daily as needed   Family history of adverse reaction to anesthesia    pta dad is very hard to wake up;excessive nausea   GAD (generalized anxiety disorder)    Generalized body aches    History of bronchitis    > 2yr ago   Hx of dysplastic nevus 05/10/2020   neck - posterior, Severe Atypia. Shave removal 07/18/2020, margins free   Hypoglycemia    Insomnia    takes Lorazepam  nightly   Joint pain    Panic attacks    PONV (postoperative nausea and vomiting)    Primary osteoarthritis of right knee    Pseudoarthrosis of lumbar spine    Recurrent major depression-severe (HCC)    Seasonal allergies    uses Flonase  daily   Spinal stenosis of lumbar region    Spondylolisthesis    Torn rotator cuff    Vitamin D  deficiency    takes Vit D daily   Wears contact lenses     Past Surgical History: Past Surgical History:  Procedure Laterality Date   APPLICATION OF INTRAOPERATIVE CT SCAN N/A 03/11/2023   Procedure: APPLICATION OF INTRAOPERATIVE CT SCAN;  Surgeon: Clois Fret, MD;  Location: [facility];  Service: Neurosurgery;  Laterality: N/A;  BACK SURGERY  2008/2012, x2 in 2016   laminectomy 1st time and 2nd time laminectomy and fusion   COLONOSCOPY WITH PROPOFOL  N/A 05/27/2020   Procedure: COLONOSCOPY WITH PROPOFOL ;  Surgeon: Janalyn Keene NOVAK, MD;  Location: [facility];  Service: Gastroenterology;  Laterality: N/A;  COVID POSITIVE AUGUST 16   MAXIMUM ACCESS (MAS)POSTERIOR LUMBAR INTERBODY FUSION (PLIF) 2 LEVEL N/A 09/01/2014   Procedure: Lumbar four-five, Lumbar five-Sacral one Maximum Access Surgery posterior lumbar interbody fusion with interbody prosthesis posterior lateral arthrodesis posterior segmental instrumentation;  Surgeon: Arley SHAUNNA Helling, MD;  Location: [facility];  Service:  Neurosurgery;  Laterality: N/A;  Lumbar four-five, Lumbar five-Sacral one Maximum Access Surgery posterior lumbar interbody fusion with i   SHOULDER ARTHROSCOPY WITH SUBACROMIAL DECOMPRESSION AND OPEN ROTATOR C Right 08/18/2021   Procedure: Right shoulder arthroscopic rotator cuff repair, biceps tenodesis, subacromial decompression;  Surgeon: Tobie Priest, MD;  Location: [facility];  Service: Orthopedics;  Laterality: Right;   SPINE SURGERY  X 5   Last 03/11/23    Allergies: Allergies as of 07/08/2024 - Review Complete 07/08/2024  Allergen Reaction Noted   Amoxicillin  Diarrhea, Nausea And Vomiting, and Shortness Of Breath 08/23/2021   Shellfish allergy Hives 08/18/2014   Flexeril  [cyclobenzaprine ] Hives 08/18/2014   Wound dressing adhesive Hives and Itching 03/11/2023    Medications: Outpatient Encounter Medications as of 07/08/2024  Medication Sig   cholecalciferol  (VITAMIN D ) 1000 UNITS tablet Take 1,000 Units by mouth daily.   clobetasol  cream (TEMOVATE ) 0.05 % Apply 1 Application topically as directed once daily to affected areas of  hands and feet as needed for flares, avoid face, groin, axilla.   Doxepin  HCl 3 MG TABS Take 1-2 tablets (3-6 mg total) by mouth at bedtime as needed.   Dupilumab  (DUPIXENT ) 300 MG/2ML SOAJ Inject 300 mg into the skin every 14 (fourteen) days.   fluticasone  (FLONASE ) 50 MCG/ACT nasal spray Place 1 spray into both nostrils daily as needed for allergies.   gabapentin  (NEURONTIN ) 600 MG tablet Take 1 tablet (600 mg total) by mouth 3 (three) times daily.   Halobetasol  Prop-Tazarotene (DUOBRII ) 0.01-0.045 % LOTN Apply 1 application topically at bedtime. qhs to aa rash on hands and feet until clear, then prn flares, avoid face, groin, axilla (Patient taking differently: Apply 1 application  topically at bedtime as needed. qhs to aa rash on hands and feet until clear, then prn flares, avoid face, groin, axilla)   methocarbamol  (ROBAXIN ) 500 MG tablet Take  1 tablet (500 mg total) by mouth 3 (three) times daily.   montelukast  (SINGULAIR ) 10 MG tablet Take 1 tablet (10 mg total) by mouth at bedtime.   morphine  (MS CONTIN ) 30 MG 12 hr tablet Take 1 tablet (30 mg total) by mouth daily.   Multiple Vitamin (MULTIVITAMIN ADULT PO) Take by mouth daily.   naloxone  (NARCAN ) nasal spray 4 mg/0.1 mL 1 spray every 2 minutes as needed for opioid overdose; spray 1 dose into ONE nostril; alternate nostrils w each dose until help arrives   Oxycodone  HCl 10 MG TABS Take 1 tablet (10 mg total) by mouth every 6 (six) hours.   rizatriptan  (MAXALT ) 10 MG tablet Take 1 tablet (10 mg total) by mouth as needed for migraine. May repeat in 2 hours if needed   tacrolimus  (PROTOPIC ) 0.1 % ointment Apply topically daily at bedtime to hands and feet   Tapinarof  (VTAMA ) 1 % CREA Apply once daily to aa hands, feet   Tavaborole  5 % SOLN Apply qhs to  affected toenails   temazepam  (RESTORIL ) 15 MG capsule Take 1 capsule (15 mg total) by mouth at bedtime as needed for sleep.   Vilazodone  HCl (VIIBRYD ) 10 MG TABS Take 1 tablet (10 mg total) by mouth daily.   [DISCONTINUED] gabapentin  (NEURONTIN ) 600 MG tablet Take 1 tablet (600 mg total) by mouth 3 (three) times daily.   No facility-administered encounter medications on file as of 07/08/2024.    Social History: Social History   Tobacco Use   Smoking status: Former    Current packs/day: 0.00    Average packs/day: 0.5 packs/day for 20.0 years (10.0 ttl pk-yrs)    Types: Cigarettes    Start date: 10/18/1996    Quit date: 10/18/2016    Years since quitting: 7.7   Smokeless tobacco: Never   Tobacco comments:    Already quit  Vaping Use   Vaping status: Never Used  Substance Use Topics   Alcohol use: No   Drug use: No    Family Medical History: Family History  Problem Relation Age of Onset   Arthritis Mother    Hyperlipidemia Mother    Migraines Mother    Arthritis Father    Diabetes Father    Heart disease Father     Atrial fibrillation Father    Diabetes Brother    Breast cancer Maternal Grandmother    Mental illness Neg Hx     Physical Examination: Vitals:   07/08/24 0944  BP: 120/72       Awake, alert, oriented to person, place, and time.  Speech is clear and fluent. Fund of knowledge is appropriate.   Cranial Nerves: Pupils equal round and reactive to light.  Facial tone is symmetric.    Well healed lumbar incisions. No posterior lumbar tenderness.   No abnormal lesions on exposed skin.   Strength: Side Iliopsoas Quads Hamstring PF DF EHL  R 5 5 5 5 5 5   L 5 5 5 5 5 5    Reflexes are 2+ and symmetric at the patella and achilles.    Clonus is not present.   Bilateral lower extremity sensation is intact to light touch, but diminished in left lateral thigh (chronic per patient).   No pain with IR/ER right hip. Mild groin pain with IR/ER of left hip.   Gait is normal.    Medical Decision Making  Imaging: Lumbar MRI dated 06/17/24:  FINDINGS: Segmentation: Standard. Lowest well-formed disc space labeled the L5-S1 level.   Alignment: Mild scoliosis with straightening of the normal lumbar lordosis. No significant listhesis.   Vertebrae: Prior interbody fusion at L3-4. Prior PLIF at L4-5. Prior interbody fusion and posterior decompression at L5-S1. Vertebral body height maintained without acute or chronic fracture. Bone marrow signal intensity within normal limits. No worrisome osseous lesions. No abnormal marrow edema.   Conus medullaris and cauda equina: Conus extends to the L1 level. Conus and cauda equina appear normal.   Paraspinal and other soft tissues: Chronic postoperative scarring present within the posterior paraspinous soft tissues. No acute finding.   Disc levels:   T11-12: Only partially seen on sagittal sequences. Disc desiccation with diffuse disc bulge. No significant spinal stenosis. Foramina are completely visualized at this level.   T12-L1  unremarkable.   L1-2:  Normal interspace.  Mild facet hypertrophy.  No stenosis.   L2-3: Disc desiccation with diffuse disc bulge. Superimposed small central disc protrusion indents the ventral thecal sac, slightly asymmetric to the right (series 8, image 14). Mild bilateral facet hypertrophy. No  significant spinal stenosis. Foramina remain adequately patent.   L3-4: Prior interbody fusion. Mild circumferential endplate spurring. Mild right greater than left facet hypertrophy. No spinal stenosis. Foramina remain widely patent.   L4-5: Prior PLIF. No residual spinal stenosis. Mild left L4 foraminal narrowing related to endplate spurring (series 7, image 12). Right neural foramen appears adequately patent.   L5-S1: Prior interbody fusion with posterior decompression. Endplate osseous spurring. Bilateral facet degeneration. No spinal stenosis. Mild left L5 foraminal narrowing. Right neural foramen appears adequately patent.   IMPRESSION: 1. Prior fusion at L3-4 through L5-S1 without residual spinal stenosis. Mild left L4 and L5 foraminal narrowing related to endplate spurring. 2. Disc bulge with small central disc protrusion at L2-3 without significant stenosis or neural impingement.     Electronically Signed   By: Morene Hoard M.D.   On: 06/18/2024 06:16  I have personally reviewed the images and agree with the above interpretation.   Lumbar xrays dated 07/08/24:  Fusion L3-S1 with mild DDD L2-L3.   Report for above xrays not yet available.    Assessment and Plan: Jamie [name] is s/p PSF L4-L5 for pseudoarthrosis on 03/11/23.   She continues with constant LBP with left buttock pain that wraps around her hip and then radiates anterior to her left foot. No significant right leg pain. Pain feels like it did prior to surgery. No new injury.   She is fused L3-S1, she has mild DDD L2-L3 with disc bulging. No central or foraminal compression. Note made of mild left L4  and L5 foraminal stenosis.   She has pain with IR/ER of her left hip. This is likely hip medidated. This would not explain LBP and left leg pain.   Treatment options discussed with patient and following plan made:   - CT scan of lumbar spine to evaluate fusion healing.  - EMG/NCS of bilateral lower extremities to evaluate left leg pain. Orders to [facility].  - Consider referral to ortho for left hip pain. Sent her a MyChart message about seeing Dr. Gust with ortho.  - Follow up to discuss CT and EMG results once I have them back. Can do phone, in person, or MyChart visit.  - She will continue to follow with [facility].   I spent a total of 20 minutes in face-to-face and non-face-to-face activities related to this patient's care today including review of outside records, review of imaging, review of symptoms, physical exam, discussion of differential diagnosis, discussion of treatment options, and documentation.   Glade Boys PA-C Dept. of Neurosurgery

## 2024-07-07 ENCOUNTER — Other Ambulatory Visit: Payer: Self-pay

## 2024-07-07 ENCOUNTER — Ambulatory Visit: Admitting: Pediatrics

## 2024-07-07 VITALS — BP 110/75 | HR 86 | Wt 170.2 lb

## 2024-07-07 DIAGNOSIS — G4700 Insomnia, unspecified: Secondary | ICD-10-CM | POA: Diagnosis not present

## 2024-07-07 DIAGNOSIS — F411 Generalized anxiety disorder: Secondary | ICD-10-CM | POA: Diagnosis not present

## 2024-07-07 MED ORDER — TEMAZEPAM 15 MG PO CAPS
15.0000 mg | ORAL_CAPSULE | Freq: Every evening | ORAL | 0 refills | Status: DC | PRN
  Filled 2024-07-07 – 2024-07-08 (×2): qty 30, 30d supply, fill #0

## 2024-07-07 MED ORDER — VILAZODONE HCL 10 MG PO TABS
10.0000 mg | ORAL_TABLET | Freq: Every day | ORAL | 1 refills | Status: DC
  Filled 2024-07-07 – 2024-07-08 (×2): qty 30, 30d supply, fill #0

## 2024-07-07 MED ORDER — DOXEPIN HCL 3 MG PO TABS
3.0000 mg | ORAL_TABLET | Freq: Every evening | ORAL | 0 refills | Status: DC | PRN
  Filled 2024-07-07: qty 30, 15d supply, fill #0

## 2024-07-07 NOTE — Progress Notes (Signed)
 Office Visit  BP 110/75 (BP Location: Left Arm, Patient Position: Sitting, Cuff Size: Large)   Pulse 86   Wt 170 lb 3.2 oz (77.2 kg)   LMP 06/20/2016 (Approximate)   SpO2 98%   BMI 26.66 kg/m    Subjective:    Patient ID: Jamie [name], female    DOB: 02-Jan-1971, 54 y.o.   MRN: [id number]  HPI: Jamie [name] is a 53 y.o. female  Chief Complaint  Patient presents with   Insomnia    Has been a problem for years, has become better over the years but may be stress induced, taking in a lot from family issues to losing job    Discussed the use of AI scribe software for clinical note transcription with the patient, who gave verbal consent to proceed.  History of Present Illness   Jamie [name] is a 53 year old female who presents with sleep issues and anxiety following significant life changes.  She experiences sleep disturbances characterized by difficulty falling asleep and frequent awakenings with racing thoughts, which prevent her from returning to sleep. She has tried various sleep medications in the past, including trazodone , which she is allergic to, and Seroquel , which was ineffective. Currently, she is not using any sleep aids other than gabapentin , which does not aid her sleep.  She has a history of anxiety and has been on multiple medications, including Buspar , nortriptyline , Prozac, Cymbalta , Zoloft , Effexor , and Paxil . She discontinued these medications due to feeling emotionally numb. Recently, she reports increased anxiety, describing episodes of crying while driving and feeling overwhelmed. She has been seeing a behavioral health provider but stopped due to work constraints and subsequently discontinued her medications without tapering.  Significant life changes have contributed to her current condition, including losing her job at the end of October and her mother's recent diagnosis of dementia. She lives with her parents and is involved in their care, which has added  stress to her life.  She is currently under a pain management contract for back issues, which have been evaluated with an MRI. She is on gabapentin  for pain management.        Relevant past medical, surgical, family and social history reviewed and updated as indicated. Interim medical history since our last visit reviewed. Allergies and medications reviewed and updated.  ROS per HPI unless specifically indicated above     Objective:    BP 110/75 (BP Location: Left Arm, Patient Position: Sitting, Cuff Size: Large)   Pulse 86   Wt 170 lb 3.2 oz (77.2 kg)   LMP 06/20/2016 (Approximate)   SpO2 98%   BMI 26.66 kg/m   Wt Readings from Last 3 Encounters:  07/08/24 170 lb (77.1 kg)  07/07/24 170 lb 3.2 oz (77.2 kg)  05/19/24 174 lb (78.9 kg)     Physical Exam Constitutional:      Appearance: Normal appearance.  Pulmonary:     Effort: Pulmonary effort is normal.  Musculoskeletal:        General: Normal range of motion.  Skin:    Comments: Normal skin color  Neurological:     General: No focal deficit present.     Mental Status: She is alert. Mental status is at baseline.  Psychiatric:        Mood and Affect: Mood normal.        Behavior: Behavior normal.        Thought Content: Thought content normal.         04/15/2024  8:04 AM 02/19/2024    2:19 PM 04/15/2023   11:17 AM 01/18/2023    4:09 PM 02/23/2022    9:04 AM  Depression screen PHQ 2/9  Decreased Interest 2 2 2 2 2   Down, Depressed, Hopeless 2 2 2 3 3   PHQ - 2 Score 4 4 4 5 5   Altered sleeping 3 3 3 3 3   Tired, decreased energy 1 2 1 2 1   Change in appetite 1 2 2 3 3   Feeling bad or failure about yourself  2 2 2 3 3   Trouble concentrating 3 3 3 3 3   Moving slowly or fidgety/restless 3 3 3 3 3   Suicidal thoughts 0 0 0 0 0  PHQ-9 Score 17  19  18  22  21    Difficult doing work/chores Somewhat difficult Somewhat difficult Somewhat difficult Somewhat difficult Very difficult     Data saved with a previous  flowsheet row definition       04/15/2024    8:05 AM 02/19/2024    2:20 PM 04/15/2023   11:17 AM 01/18/2023    4:10 PM  GAD 7 : Generalized Anxiety Score  Nervous, Anxious, on Edge 3 3 3 3   Control/stop worrying 2 2 2 3   Worry too much - different things 3 3 3 3   Trouble relaxing 3 3 3 3   Restless 3 3 3 3   Easily annoyed or irritable 3 2 2 3   Afraid - awful might happen 2 2 2 2   Total GAD 7 Score 19 18 18 20   Anxiety Difficulty Somewhat difficult Somewhat difficult Somewhat difficult Somewhat difficult       Assessment & Plan:  Assessment & Plan   GAD (generalized anxiety disorder) Assessment & Plan: Significant mood changes and anxiety due to life stressors. Previous SSRIs and SNRIs ineffective or caused emotional blunting. Viibryd  considered for quicker onset of action. - Prescribed Viibryd  10 mg daily, option to increase to 20 mg after one week if beneficial. - Scheduled follow-up in two weeks to assess efficacy. - Referred to psychiatry for further evaluation and management. - Referred to psychology for therapy.   Orders: -     AMB Referral [facility] -     Doxepin  HCl; Take 1-2 tablets (3-6 mg total) by mouth at bedtime as needed.  Dispense: 30 tablet; Refill: 0 -     Vilazodone  HCl; Take 1 tablet (10 mg total) by mouth daily.  Dispense: 30 tablet; Refill: 1 -     Ambulatory referral to Psychiatry  Insomnia, unspecified type Assessment & Plan: Chronic insomnia with difficulty initiating and maintaining sleep. Previous sleep aids ineffective or poorly tolerated. Doxepin  considered for its safety and long-term use potential. - Prescribed doxepin  3 mg, option to increase to 6 mg as needed. - Advised taking doxepin  30 minutes before bedtime. - Scheduled follow-up in two weeks to assess efficacy.  Orders: -     Temazepam ; Take 1 capsule (15 mg total) by mouth at bedtime as needed for sleep.  Dispense: 30 capsule; Refill: 0     Follow up plan: Return in about 2  weeks (around 07/21/2024) for Mood.  Hadassah SHAUNNA Nett, MD

## 2024-07-08 ENCOUNTER — Encounter: Payer: Self-pay | Admitting: Orthopedic Surgery

## 2024-07-08 ENCOUNTER — Other Ambulatory Visit: Payer: Self-pay

## 2024-07-08 ENCOUNTER — Ambulatory Visit

## 2024-07-08 ENCOUNTER — Ambulatory Visit (INDEPENDENT_AMBULATORY_CARE_PROVIDER_SITE_OTHER): Admitting: Orthopedic Surgery

## 2024-07-08 VITALS — BP 120/72 | Ht 67.0 in | Wt 170.0 lb

## 2024-07-08 DIAGNOSIS — Z981 Arthrodesis status: Secondary | ICD-10-CM

## 2024-07-08 DIAGNOSIS — M5416 Radiculopathy, lumbar region: Secondary | ICD-10-CM

## 2024-07-08 DIAGNOSIS — M25552 Pain in left hip: Secondary | ICD-10-CM

## 2024-07-08 DIAGNOSIS — Z4789 Encounter for other orthopedic aftercare: Secondary | ICD-10-CM | POA: Diagnosis not present

## 2024-07-08 DIAGNOSIS — M51362 Other intervertebral disc degeneration, lumbar region with discogenic back pain and lower extremity pain: Secondary | ICD-10-CM

## 2024-07-08 MED ORDER — OXYCODONE HCL 10 MG PO TABS
10.0000 mg | ORAL_TABLET | Freq: Four times a day (QID) | ORAL | 0 refills | Status: DC
  Filled 2024-07-10: qty 120, 30d supply, fill #0

## 2024-07-08 MED ORDER — NALOXONE HCL 4 MG/0.1ML NA LIQD
NASAL | 0 refills | Status: DC
  Filled 2024-07-08: qty 2, 30d supply, fill #0

## 2024-07-08 MED ORDER — GABAPENTIN 600 MG PO TABS
600.0000 mg | ORAL_TABLET | Freq: Three times a day (TID) | ORAL | 0 refills | Status: DC
  Filled 2024-07-08: qty 90, 30d supply, fill #0

## 2024-07-08 MED ORDER — MORPHINE SULFATE ER 30 MG PO TBCR
30.0000 mg | EXTENDED_RELEASE_TABLET | Freq: Every day | ORAL | 0 refills | Status: DC
  Filled 2024-07-08: qty 30, 30d supply, fill #0

## 2024-07-08 MED ORDER — METHOCARBAMOL 500 MG PO TABS
500.0000 mg | ORAL_TABLET | Freq: Three times a day (TID) | ORAL | 0 refills | Status: DC
  Filled 2024-07-08: qty 90, 30d supply, fill #0

## 2024-07-09 ENCOUNTER — Other Ambulatory Visit: Payer: Self-pay

## 2024-07-09 ENCOUNTER — Encounter: Payer: Self-pay | Admitting: Orthopedic Surgery

## 2024-07-09 ENCOUNTER — Encounter: Payer: Self-pay | Admitting: Neurology

## 2024-07-10 ENCOUNTER — Other Ambulatory Visit: Payer: Self-pay

## 2024-07-13 ENCOUNTER — Ambulatory Visit
Admission: RE | Admit: 2024-07-13 | Discharge: 2024-07-13 | Disposition: A | Discharge status: Home / Self Care | Source: Ambulatory Visit | Attending: Orthopedic Surgery | Admitting: Orthopedic Surgery

## 2024-07-13 ENCOUNTER — Encounter: Payer: Self-pay | Admitting: Orthopedic Surgery

## 2024-07-13 DIAGNOSIS — M5126 Other intervertebral disc displacement, lumbar region: Secondary | ICD-10-CM | POA: Diagnosis not present

## 2024-07-13 DIAGNOSIS — Z981 Arthrodesis status: Secondary | ICD-10-CM

## 2024-07-13 DIAGNOSIS — M51362 Other intervertebral disc degeneration, lumbar region with discogenic back pain and lower extremity pain: Secondary | ICD-10-CM

## 2024-07-13 DIAGNOSIS — M5416 Radiculopathy, lumbar region: Secondary | ICD-10-CM

## 2024-07-13 DIAGNOSIS — M25552 Pain in left hip: Secondary | ICD-10-CM

## 2024-07-13 NOTE — Telephone Encounter (Signed)
 Lumbar xrays dated 07/08/24:  FINDINGS: There are five non-rib bearing lumbar-type vertebral bodies. Status post posterior fixation of L4-5 with intervertebral spacer placement of L3-4, L4-5 and L5-S1. Orthopedic hardware is intact and without periprosthetic fracture or lucency. Straightening of the lumbar lordosis. No significant spondylolisthesis or evidence of dynamic instability limited excursion on flexion and extension views. Mild intervertebral disc space height loss of L2-3. No acute compression fracture deformity. Visualized abdomen is unremarkable. Age advanced atherosclerotic calcifications.   IMPRESSION: Status post posterior fixation of L4-5 with intervertebral spacer placement of L3-4, L4-5 and L5-S1. No evidence of hardware complication.   Age advanced vascular calcifications. Recommend correlation for underlying coronary artery disease risk factors.     Electronically Signed   By: Corean Salter M.D.   On: 07/12/2024 15:02   I have personally reviewed the images and agree with the above interpretation.

## 2024-07-14 ENCOUNTER — Other Ambulatory Visit: Payer: Self-pay

## 2024-07-15 ENCOUNTER — Ambulatory Visit (INDEPENDENT_AMBULATORY_CARE_PROVIDER_SITE_OTHER)

## 2024-07-15 DIAGNOSIS — M160 Bilateral primary osteoarthritis of hip: Secondary | ICD-10-CM | POA: Diagnosis not present

## 2024-07-15 DIAGNOSIS — M25552 Pain in left hip: Secondary | ICD-10-CM

## 2024-07-15 DIAGNOSIS — M1612 Unilateral primary osteoarthritis, left hip: Secondary | ICD-10-CM | POA: Diagnosis not present

## 2024-07-15 DIAGNOSIS — M25551 Pain in right hip: Secondary | ICD-10-CM | POA: Diagnosis not present

## 2024-07-15 NOTE — Patient Instructions (Addendum)
 [facility] [address], [place] [address] [phone]   Thank you for visiting the office today. We appreciate your trust and allowing us  to help you with your orthopedic needs.  Please do not hesitate to call if you have further questions or concerns following your visit with us . If you experience life-threatening symptoms or it cannot wait until normal office hours, please go to the nearest Emergency Department for immediate evaluation.      [employer] Ad[employer] (CAP/DA) [email or web address]

## 2024-07-15 NOTE — Progress Notes (Signed)
 Orthopaedic Surgery New Patient Visit   History of Present Illness: The patient is a 53 y.o. female seen in clinic for 2 to 29-month history of left hip pain.  Located primarily in the groin.  Associated low back pain that radiates to the lateral aspect of the hip and to the anterior and lateral thigh.  Associated occasional buckling/giving out sensation.  Causing difficulty sleeping; cannot lay on her left side.  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  Patient reports she has had 5 lumbar surgeries, most recent patient underwent L4-L5 fusion on 03/11/2023 per Dr. Reeves Daisy.  Patient managed by [facility] at [facility]. Last seen on 07/08/2024, 1 week ago.  Due to groin pain and physical exam findings suggesting some hip mediated pain, neurosurgery referred to orthopedic surgery.  Patient also regularly followed by [facility] medical for pain management.  Currently on Robaxin , oxycodone , and MS contin . Patient is a former cigarette smoker; quit in 2018.  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  Patient is recently unemployed; since 06/15/2024.  Living with her parents to help care for them.  Her mom was recently diagnosed with dementia.   Past Medical, Social and Family History: Past Medical History:  Diagnosis Date   Allergic rhinitis    Allergy 1980   Anxiety    takes Ativan  daily.  Panic Attack   Arthritis    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, unspecified (HCC)    At risk for long QT syndrome    Chronic back pain    herniated disc/stenosis/scoliosis   DDD (degenerative disc disease), lumbar    Depression    takes Effexor  daily   Displacement of lumbar intervertebral disc without myelopathy    Eczema    uses a cream daily as needed   Family history of adverse reaction to anesthesia    pta dad is very hard to wake up;excessive nausea   GAD (generalized anxiety disorder)    Generalized body  aches    History of bronchitis    > 71yr ago   Hx of dysplastic nevus 05/10/2020   neck - posterior, Severe Atypia. Shave removal 07/18/2020, margins free   Hypoglycemia    Insomnia    takes Lorazepam  nightly   Joint pain    Panic attacks    PONV (postoperative nausea and vomiting)    Primary osteoarthritis of right knee    Pseudoarthrosis of lumbar spine    Recurrent major depression-severe (HCC)    Seasonal allergies    uses Flonase  daily   Spinal stenosis of lumbar region    Spondylolisthesis    Torn rotator cuff    Vitamin D  deficiency    takes Vit D daily   Wears contact lenses    Past Surgical History:  Procedure Laterality Date   APPLICATION OF INTRAOPERATIVE CT SCAN N/A 03/11/2023   Procedure: APPLICATION OF INTRAOPERATIVE CT SCAN;  Surgeon: Daisy Reeves, MD;  Location: [facility] ORS;  Service: Neurosurgery;  Laterality: N/A;   BACK SURGERY  2008/2012, x2 in 2016   laminectomy 1st time and 2nd time laminectomy and fusion   COLONOSCOPY WITH PROPOFOL  N/A 05/27/2020   Procedure: COLONOSCOPY WITH PROPOFOL ;  Surgeon: Janalyn Keene NOVAK, MD;  Location: [facility] END[facility];  Service: Gastroenterology;  Laterality: N/A;  COVID POSITIVE AUGUST 16   MAXIMUM ACCESS (MAS)POSTERIOR LUMBAR INTERBODY FUSION (PLIF) 2 LEVEL N/A 09/01/2014   Procedure: Lumbar four-five, Lumbar five-Sacral one Maximum Access Surgery posterior lumbar  interbody fusion with interbody prosthesis posterior lateral arthrodesis posterior segmental instrumentation;  Surgeon: Arley SHAUNNA Helling, MD;  Location: [facility];  Service: Neurosurgery;  Laterality: N/A;  Lumbar four-five, Lumbar five-Sacral one Maximum Access Surgery posterior lumbar interbody fusion with i   SHOULDER ARTHROSCOPY WITH SUBACROMIAL DECOMPRESSION AND OPEN ROTATOR C Right 08/18/2021   Procedure: Right shoulder arthroscopic rotator cuff repair, biceps tenodesis, subacromial decompression;  Surgeon: Tobie Priest, MD;  Location: [facility];   Service: Orthopedics;  Laterality: Right;   SPINE SURGERY  X 5   Last 03/11/23   Allergies  Allergen Reactions   Amoxicillin  Diarrhea, Nausea And Vomiting and Shortness Of Breath   Shellfish Allergy Hives   Flexeril  [Cyclobenzaprine ] Hives    RAPID HEARTBEAT   Wound Dressing Adhesive Hives and Itching   Current Outpatient Medications on File Prior to Visit  Medication Sig Dispense Refill   cholecalciferol  (VITAMIN D ) 1000 UNITS tablet Take 1,000 Units by mouth daily.     clobetasol  cream (TEMOVATE ) 0.05 % Apply 1 Application topically as directed once daily to affected areas of  hands and feet as needed for flares, avoid face, groin, axilla. 45 g 3   Doxepin  HCl 3 MG TABS Take 1-2 tablets (3-6 mg total) by mouth at bedtime as needed. 30 tablet 0   Dupilumab  (DUPIXENT ) 300 MG/2ML SOAJ Inject 300 mg into the skin every 14 (fourteen) days. 4 mL 5   fluticasone  (FLONASE ) 50 MCG/ACT nasal spray Place 1 spray into both nostrils daily as needed for allergies. 16 g 12   gabapentin  (NEURONTIN ) 600 MG tablet Take 1 tablet (600 mg total) by mouth 3 (three) times daily. 90 tablet 0   gabapentin  (NEURONTIN ) 600 MG tablet Take 1 tablet (600 mg total) by mouth 3 (three) times daily. 90 tablet 0   Halobetasol  Prop-Tazarotene (DUOBRII ) 0.01-0.045 % LOTN Apply 1 application topically at bedtime. qhs to aa rash on hands and feet until clear, then prn flares, avoid face, groin, axilla (Patient taking differently: Apply 1 application  topically at bedtime as needed. qhs to aa rash on hands and feet until clear, then prn flares, avoid face, groin, axilla) 100 g 1   methocarbamol  (ROBAXIN ) 500 MG tablet Take 1 tablet (500 mg total) by mouth 3 (three) times daily. 90 tablet 0   methocarbamol  (ROBAXIN ) 500 MG tablet Take 1 tablet (500 mg total) by mouth 3 (three) times daily. 90 tablet 0   montelukast  (SINGULAIR ) 10 MG tablet Take 1 tablet (10 mg total) by mouth at bedtime. 90 tablet 3   morphine  (MS CONTIN ) 30 MG 12  hr tablet Take 1 tablet (30 mg total) by mouth daily. 30 tablet 0   morphine  (MS CONTIN ) 30 MG 12 hr tablet Take 1 tablet (30 mg total) by mouth daily. 30 tablet 0   Multiple Vitamin (MULTIVITAMIN ADULT PO) Take by mouth daily.     naloxone  (NARCAN ) nasal spray 4 mg/0.1 mL 1 spray every 2 minutes as needed for opioid overdose; spray 1 dose into ONE nostril; alternate nostrils w each dose until help arrives 2 each 0   naloxone  (NARCAN ) nasal spray 4 mg/0.1 mL Use 1 spray every 2 minutes as needed for opioid overdose; spray 1 dose into ONE nostril; alternate nostrils w each dose until help arrives 2 each 0   Oxycodone  HCl 10 MG TABS Take 1 tablet (10 mg total) by mouth every 6 (six) hours. 120 tablet 0   rizatriptan  (MAXALT ) 10 MG tablet Take 1 tablet (  10 mg total) by mouth as needed for migraine. May repeat in 2 hours if needed 10 tablet 4   tacrolimus  (PROTOPIC ) 0.1 % ointment Apply topically daily at bedtime to hands and feet 100 g 3   Tapinarof  (VTAMA ) 1 % CREA Apply once daily to aa hands, feet 60 g 2   Tavaborole  5 % SOLN Apply qhs to affected toenails 10 mL 11   temazepam  (RESTORIL ) 15 MG capsule Take 1 capsule (15 mg total) by mouth at bedtime as needed for sleep. 30 capsule 0   Vilazodone  HCl (VIIBRYD ) 10 MG TABS Take 1 tablet (10 mg total) by mouth daily. 30 tablet 1   No current facility-administered medications on file prior to visit.   Social History   Tobacco Use   Smoking status: Former    Current packs/day: 0.00    Average packs/day: 0.5 packs/day for 20.0 years (10.0 ttl pk-yrs)    Types: Cigarettes    Start date: 10/18/1996    Quit date: 10/18/2016    Years since quitting: 7.7   Smokeless tobacco: Never   Tobacco comments:    Already quit  Vaping Use   Vaping status: Never Used  Substance Use Topics   Alcohol use: No   Drug use: No      I have reviewed past medical, surgical, social and family history, medications and allergies as documented in the EMR.  Review of  Systems - A ROS was performed including pertinent positives and negatives as documented in the HPI.     Physical Exam:  General/Constitutional: NAD Vascular: No edema, swelling or tenderness, except as noted in detailed exam Integumentary: No impressive skin lesions present, except as noted in detailed exam Neuro/Psych: Normal mood and affect, oriented to person, place and time Musculoskeletal: Normal, except as noted in detailed exam and in HPI   Focused Orthopaedic Examination:  Hip Examination (focused):   LEFT  ROM (degrees): Flexion-Extension 5-130    Abduction 45   Adduction 20    IR 25    ER 30   Palpation (pain): Anterior negative   Iliopsoas negative   Greater troch negative   ASIS negative   AIIS negative   Posterior positive   Adductors negative  Special Tests: FADIR positive   FABER positive   Log Roll positive   Stinchfields positive   Ober's negative  Other: Hip flexion strength 4+/5   Hip adductor strength  5/5   Hip abductor strength 5/5  Knee flexion: 4+/5 Knee extension: 4+/5  No gait abnormality.  Vascular/Lymphatic: 2+ dorsalis pedis/posterior tibialis pulses, foot warm and well perfused Neurologic: Sensation intact to light touch to Superficial peroneal/Deep peroneal/Tibial/Sural/Saphenous nerves     XR Left Hip Imaging: X-rays of the Left Hip including 3-views (pelvis AP, lateral, hip AP) obtained today 07/15/2024 at [facility] at [facility] were reviewed personally by me.  Per my independent interpretation these images show lumbar instrumentation.  No acute fracture.  No dislocation.  Moderate degenerative changes noted about the bilateral hips.  More severe on left side.  Significant joint space narrowing on superior aspect of left hip with acetabular rim osteophyte.  Patient also with osteophyte on inferior aspect of femoral head.  No soft tissue abnormality.    Radiology Read: Lumbar Spine Xray on  07/08/2024 IMPRESSION: Status post posterior fixation of L4-5 with intervertebral spacer placement of L3-4, L4-5 and L5-S1. No evidence of hardware complication.   Age advanced vascular calcifications. Recommend correlation for underlying coronary artery disease  risk factors.  Lumbar Spine MRI on 06/17/2024 IMPRESSION: 1. Prior fusion at L3-4 through L5-S1 without residual spinal stenosis. Mild left L4 and L5 foraminal narrowing related to endplate spurring. 2. Disc bulge with small central disc protrusion at L2-3 without significant stenosis or neural impingement.  Assessment:  Bilateral hip osteoarthritis, left worse than right  Plan:  Patient was seen and examined in office today. We reviewed patient's history, examination, and imaging in detail. Based on information available for this encounter, patient with 2 to 30-month history of left hip pain.  Located in the groin.  Patient with concomitant low back pain and lumbar radiculopathy.  Physical exam findings positive for intra-articular origin of hip pain, positive Stinchfield, positive FABER/FADIR.  Left hip imaging/x-ray performed in office today revealed moderate acetabulofemoral osteoarthritis.  Discussed findings with patient.  Discussed conservative management of rest, ice/heat, over-the-counter prescription anti-inflammatories, intra-articular hip injection, and formal physical therapy.  Patient interested in starting physical therapy.  Patient with previously poor results secondary to cortisone injections in the back and knee.  Patient at this time would like to think about the left hip intra-articular steroid injection, prior to referral.  Patient scheduled to return to clinic in 8 weeks for reevaluation, sooner if any new/worsening symptoms or concerns.   Patient education material was provided.  All questions, concerns and comments were addressed to the best of my ability.  Follow-up: 8 weeks for re-evaluation; sooner if  any new/worsening symptoms or concerns   Arlyss GEANNIE Schneider, DO Orthopedic Surgery & Sports Medicine [place]   This document was dictated using Dragon voice recognition software. A reasonable attempt at proof reading has been made to minimize errors.

## 2024-07-20 ENCOUNTER — Encounter: Payer: Self-pay | Admitting: Pediatrics

## 2024-07-20 ENCOUNTER — Other Ambulatory Visit: Payer: Self-pay | Admitting: Pharmacy Technician

## 2024-07-20 ENCOUNTER — Other Ambulatory Visit: Payer: Self-pay

## 2024-07-20 NOTE — Progress Notes (Signed)
 Specialty Pharmacy Refill Coordination Note  Tylyn Stankovich is a 53 y.o. female contacted today regarding refills of specialty medication(s) Dupixent   Patient requested (Patient-Rptd) Delivery   Delivery date: 07/28/2024 Verified address: (Patient-Rptd) [address] [place], [place] [address]   Medication will be filled on: 07/27/2024

## 2024-07-20 NOTE — Assessment & Plan Note (Signed)
 Chronic insomnia with difficulty initiating and maintaining sleep. Previous sleep aids ineffective or poorly tolerated. Doxepin  considered for its safety and long-term use potential. - Prescribed doxepin  3 mg, option to increase to 6 mg as needed. - Advised taking doxepin  30 minutes before bedtime. - Scheduled follow-up in two weeks to assess efficacy.

## 2024-07-20 NOTE — Assessment & Plan Note (Signed)
 Significant mood changes and anxiety due to life stressors. Previous SSRIs and SNRIs ineffective or caused emotional blunting. Viibryd  considered for quicker onset of action. - Prescribed Viibryd  10 mg daily, option to increase to 20 mg after one week if beneficial. - Scheduled follow-up in two weeks to assess efficacy. - Referred to psychiatry for further evaluation and management. - Referred to psychology for therapy.

## 2024-07-21 ENCOUNTER — Ambulatory Visit: Admitting: Family Medicine

## 2024-07-22 NOTE — Progress Notes (Unsigned)
 Telephone Visit- Progress Note: Referring Physician:  Vicci Duwaine SQUIBB, DO [address] [place],  [place] [address]  Primary Physician:  Vicci Duwaine SQUIBB, DO  This visit was performed via telephone.  Patient location: home Provider location: office  I spent a total of 10 minutes non-face-to-face activities for this visit on the date of this encounter including review of current clinical condition and response to treatment.    Patient has given verbal consent to this telephone visits and we reviewed the limitations of a telephone visit. Patient wishes to proceed.    Chief Complaint:  review imaging  History of Present Illness: Jamie [name] is a 53 y.o. female has a history of  eczema, GAD, panic attack, depression.    She is s/p PSF L4-L5 for pseudoarthrosis on 03/11/23. Last saw me on 07/09/23 for constant LBP with left hip and leg pain.   She is fused L3-S1, she has mild DDD L2-L3 with disc bulging. No central or foraminal compression. Note made of mild left L4 and L5 foraminal stenosis.   CT of lumbar spine ordered along with EMG of her lower extremities. She was sent to ortho as well- she has moderate left hip OA and was sent to PT. Discussed hip injection and she declined.   Phone visit scheduled to review her CT results.  She is about the same. She has constant LBP with left buttock and left hip pain along with left anterior leg pain to her foot. No right leg pain. She thinks her hip pain is getting worse. Pain is worse with prolonged standing/sitting. Pain is better with changing positions. She has numbness, tingling, and weakness in left > right leg.     She is taking robaxin , oxycodone  10mg , and MS Contin  from pain management ([facility]).    She lives with her parents and helps care for them. Her mom was recently diagnosed with dementia.    Tobacco use:  Does not smoke.    Conservative measures:  Physical therapy: not scheduled of left hip PT yet Multimodal  medical therapy including regular antiinflammatories: oxycodone , MS Contin , robaxin   Injections:  No epidural steroid injections   Past Surgery:  PSF L4-L5 for pseudoarthrosis on 03/11/23 by Dr. Clois   The symptoms are causing a significant impact on the patient's life.   Exam: No exam done as this was a telephone encounter.     Imaging: CT lumbar spine dated 07/13/24:  FINDINGS: Normal lumbar segmentation, the same numbering system used on the recent MRI.   BONES AND ALIGNMENT: Stable straightening of lumbar lordosis. Minimal s shaped scoliosis. Mild chronic retrolisthesis of L2 on L3 is stable. Maintained vertebral height. Intact visible sacrum and SI joints. No acute osseous abnormality. Calcified aortoiliac bifurcation atherosclerosis. Incidental small chronic duodenal diverticulum. Negative other visible non-contrast abdominal viscera. In the pelvis there is diverticulosis of the distal large bowel.   SOFT TISSUES: Postoperative changes to the lumbar posterior paraspinal soft tissues with no adverse features. Adjacent segment degeneration at L2-L3 with mild retrolisthesis and stable mild mass effect on the ventral thecal sac, concordant with prior MRI (series 2 image 52). No high-grade stenosis.   DEGENERATIVE CHANGES:   T11-T12: Chronic disc space loss, disc bulging and endplate spurring. No stenosis by CT.   T12-L1 AND L1-L2: Negative.   L2-L3: Mild disc space loss. Mild retrolisthesis. Circumferential disc bulge asymmetric to the right. Mild posterior element hypertrophy. Mild mass effect on the ventral thecal sac appears stable from the  recent MRI (series 2 image 52, see additional details on that exam).   L3-L4: Chronic decompression and fusion. Interbody implant with solid arthrodesis. No stenosis.   L4-L5: Prior decompression and fusion. Bilateral pedicle screws and interbody implant. No evidence of hardware loosening and increased interbody  calcification from the previous CT, but evidence that some interbody gas phenomena persists (series 5 image 33) and no convincing solid arthrodesis. No stenosis.   L5-S1: Chronic decompression and fusion. Interbody implant. Evidence of solid posterior element arthrodesis (for example sagittal image 25 on the right). No stenosis.   IMPRESSION: 1. Previous lumbar decompression and fusion L3 through S1. Solid arthrodesis at L3, L4, and L5-S1. No hardware loosening. 2. L4-L5 increased interbody calcification since 2023 but trace ongoing vacuum phenomenon and no convincing arthrodesis. 3.   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   Electronically signed by: Helayne Hurst MD 07/19/2024 10:43 AM EST RP Workstation: [id number]      I have personally reviewed the images and agree with the above interpretation.   Assessment and Plan: Ms. Cid is s/p PSF L4-L5 for pseudoarthrosis on 03/11/23.   She has constant LBP with left buttock and left hip pain along with left anterior leg pain to her foot. No right leg pain. She thinks her hip pain is getting worse. She has numbness, tingling, and weakness in left > right leg.     She is fused L3-S1, she has mild DDD L2-L3 with disc bulging. No central or foraminal compression. Note made of mild left L4 and L5 foraminal stenosis. CT shows she is not yet fully healed at L4-L5.   She has moderate OA of left hip. This is likely cause of left hip pain.    Treatment options discussed with patient and following plan made:   - Would give her more time to see if she fuses at L4-L5.  - She is not interested in further spine injections. - She will get PT scheduled for left hip per ortho.  - She has EMG of lower extremities scheduled on 09/18/24.  - She will discuss any possible medication changes with pain management.  - Will follow up with me after her EMG to review the results.  - May consider SCS at some point. Had trial years  ago with no relief.   CT scan reviewed again with Dr. Clois after her visit. He recommends giving this time to fuse. He wants to get repeat CT scan at 2 years postop (around 03/10/25).   Glade Boys PA-C Neurosurgery

## 2024-07-23 ENCOUNTER — Other Ambulatory Visit: Payer: Self-pay

## 2024-07-23 DIAGNOSIS — R202 Paresthesia of skin: Secondary | ICD-10-CM

## 2024-07-27 ENCOUNTER — Other Ambulatory Visit: Payer: Self-pay

## 2024-07-27 ENCOUNTER — Ambulatory Visit: Admitting: Orthopedic Surgery

## 2024-07-27 ENCOUNTER — Encounter: Payer: Self-pay | Admitting: Orthopedic Surgery

## 2024-07-27 DIAGNOSIS — M5416 Radiculopathy, lumbar region: Secondary | ICD-10-CM

## 2024-07-27 DIAGNOSIS — Z981 Arthrodesis status: Secondary | ICD-10-CM | POA: Diagnosis not present

## 2024-07-27 DIAGNOSIS — M25552 Pain in left hip: Secondary | ICD-10-CM

## 2024-07-27 DIAGNOSIS — M1612 Unilateral primary osteoarthritis, left hip: Secondary | ICD-10-CM

## 2024-07-27 DIAGNOSIS — M51362 Other intervertebral disc degeneration, lumbar region with discogenic back pain and lower extremity pain: Secondary | ICD-10-CM

## 2024-07-27 DIAGNOSIS — M5116 Intervertebral disc disorders with radiculopathy, lumbar region: Secondary | ICD-10-CM

## 2024-07-30 ENCOUNTER — Other Ambulatory Visit (HOSPITAL_COMMUNITY): Payer: Self-pay

## 2024-07-30 DIAGNOSIS — Z79899 Other long term (current) drug therapy: Secondary | ICD-10-CM | POA: Diagnosis not present

## 2024-07-30 MED ORDER — METHOCARBAMOL 750 MG PO TABS
750.0000 mg | ORAL_TABLET | Freq: Three times a day (TID) | ORAL | 0 refills | Status: DC
  Filled 2024-07-30 (×2): qty 90, 30d supply, fill #0

## 2024-07-30 MED ORDER — MORPHINE SULFATE ER 30 MG PO TBCR
30.0000 mg | EXTENDED_RELEASE_TABLET | Freq: Every day | ORAL | 0 refills | Status: DC
  Filled 2024-08-17: qty 30, 30d supply, fill #0

## 2024-07-30 MED ORDER — OXYCODONE HCL 15 MG PO TABS
15.0000 mg | ORAL_TABLET | Freq: Four times a day (QID) | ORAL | 0 refills | Status: DC
  Filled 2024-07-30: qty 120, 30d supply, fill #0

## 2024-07-30 MED ORDER — GABAPENTIN 600 MG PO TABS
600.0000 mg | ORAL_TABLET | Freq: Three times a day (TID) | ORAL | 0 refills | Status: DC
  Filled 2024-07-30 (×2): qty 90, 30d supply, fill #0

## 2024-07-30 MED ORDER — NALOXONE HCL 4 MG/0.1ML NA LIQD
NASAL | 0 refills | Status: DC
  Filled 2024-07-30: qty 2, 1d supply, fill #0

## 2024-07-31 ENCOUNTER — Other Ambulatory Visit (HOSPITAL_COMMUNITY): Payer: Self-pay

## 2024-07-31 ENCOUNTER — Other Ambulatory Visit: Payer: Self-pay

## 2024-08-04 ENCOUNTER — Other Ambulatory Visit: Payer: Self-pay

## 2024-08-04 ENCOUNTER — Ambulatory Visit: Payer: 59 | Admitting: Dermatology

## 2024-08-04 DIAGNOSIS — D485 Neoplasm of uncertain behavior of skin: Secondary | ICD-10-CM

## 2024-08-04 DIAGNOSIS — L309 Dermatitis, unspecified: Secondary | ICD-10-CM

## 2024-08-04 DIAGNOSIS — L2089 Other atopic dermatitis: Secondary | ICD-10-CM

## 2024-08-04 DIAGNOSIS — L814 Other melanin hyperpigmentation: Secondary | ICD-10-CM

## 2024-08-04 DIAGNOSIS — L578 Other skin changes due to chronic exposure to nonionizing radiation: Secondary | ICD-10-CM

## 2024-08-04 DIAGNOSIS — R238 Other skin changes: Secondary | ICD-10-CM

## 2024-08-04 DIAGNOSIS — Z1283 Encounter for screening for malignant neoplasm of skin: Secondary | ICD-10-CM

## 2024-08-04 DIAGNOSIS — Z86018 Personal history of other benign neoplasm: Secondary | ICD-10-CM | POA: Diagnosis not present

## 2024-08-04 DIAGNOSIS — L821 Other seborrheic keratosis: Secondary | ICD-10-CM

## 2024-08-04 DIAGNOSIS — W908XXA Exposure to other nonionizing radiation, initial encounter: Secondary | ICD-10-CM | POA: Diagnosis not present

## 2024-08-04 DIAGNOSIS — D229 Melanocytic nevi, unspecified: Secondary | ICD-10-CM

## 2024-08-04 DIAGNOSIS — D1801 Hemangioma of skin and subcutaneous tissue: Secondary | ICD-10-CM

## 2024-08-04 DIAGNOSIS — D239 Other benign neoplasm of skin, unspecified: Secondary | ICD-10-CM

## 2024-08-04 DIAGNOSIS — Z79899 Other long term (current) drug therapy: Secondary | ICD-10-CM

## 2024-08-04 DIAGNOSIS — D225 Melanocytic nevi of trunk: Secondary | ICD-10-CM

## 2024-08-04 MED ORDER — DUPIXENT 300 MG/2ML ~~LOC~~ SOAJ
300.0000 mg | SUBCUTANEOUS | 5 refills | Status: DC
  Filled 2024-08-04: qty 4, 28d supply, fill #0

## 2024-08-04 NOTE — Patient Instructions (Signed)

## 2024-08-04 NOTE — Progress Notes (Signed)
 Follow-Up Visit   Subjective  Jamie [name] is a 53 y.o. female who presents for the following: Skin Cancer Screening and Full Body Skin Exam  The patient presents for Total-Body Skin Exam (TBSE) for skin cancer screening and mole check. The patient has spots, moles and lesions to be evaluated, some may be new or changing. History of severe dysplastic nevus of the posterior neck. Atopic Dermatitis is controlled with Dupixent  injections and Vtama  Cream.  Irritated spot on chest, catches on jewelry.  The following portions of the chart were reviewed this encounter and updated as appropriate: medications, allergies, medical history  Review of Systems:  No other skin or systemic complaints except as noted in HPI or Assessment and Plan.  Objective  Well appearing patient in no apparent distress; mood and affect are within normal limits.  A full examination was performed including scalp, head, eyes, ears, nose, lips, neck, chest, axillae, abdomen, back, buttocks, bilateral upper extremities, bilateral lower extremities, hands, feet, fingers, toes, fingernails, and toenails. All findings within normal limits unless otherwise noted below.   Relevant physical exam findings are noted in the Assessment and Plan.  L upper sternum 3 mm flesh papule   Assessment & Plan   SKIN CANCER SCREENING PERFORMED TODAY.  HISTORY OF DYSPLASTIC NEVUS. Neck - posterior, Severe Atypia. Shave removal 07/18/2020, margins free   No evidence of recurrence today Recommend regular full body skin exams Recommend daily broad spectrum sunscreen SPF 30+ to sun-exposed areas, reapply every 2 hours as needed.  Call if any new or changing lesions are noted between office visits   ACTINIC DAMAGE - Chronic condition, secondary to cumulative UV/sun exposure - diffuse scaly erythematous macules with underlying dyspigmentation - Recommend daily broad spectrum sunscreen SPF 30+ to sun-exposed areas, reapply every 2 hours as  needed.  - Staying in the shade or wearing long sleeves, sun glasses (UVA+UVB protection) and wide brim hats (4-inch brim around the entire circumference of the hat) are also recommended for sun protection.  - Call for new or changing lesions.  LENTIGINES, SEBORRHEIC KERATOSES, HEMANGIOMAS - Benign normal skin lesions - Benign-appearing - Call for any changes  MELANOCYTIC NEVI - Tan-brown and/or pink-flesh-colored symmetric macules and papules - Benign appearing on exam today - Observation - Call clinic for new or changing moles - Recommend daily use of broad spectrum spf 30+ sunscreen to sun-exposed areas.   ATOPIC DERMATITIS Exam: Erythema and mild scale at the palms,  L lat sole, R plantar foot at ball 3% BSA  Chronic and persistent condition with duration or expected duration over one year. Condition is improving with treatment but not currently at goal.   Atopic dermatitis - Severe, on Dupixent  (biologic medication).  Atopic dermatitis (eczema) is a chronic, relapsing, pruritic condition that can significantly affect quality of life. It is often associated with allergic rhinitis and/or asthma and can require treatment with topical medications, phototherapy, or in severe cases biologic medications, which require long term medication management.  Treatment Plan: Continue Dupixent  300 mg/2mL SQ QOW 5Rf. Patient denies side effects. Continue Vtama  once daily to aa hands, feet.  Potential side effects include allergic reaction, herpes infections, injection site reactions and conjunctivitis (inflammation of the eyes).  The use of Dupixent  requires long term medication management, including periodic office visits.     Long term medication management.  Patient is using long term (months to years) prescription medication  to control their dermatologic condition.  These medications require periodic monitoring to evaluate for efficacy  and side effects and may require periodic laboratory  monitoring.   Recommend gentle skin care.  DERMATOFIBROMA VS SCAR Exam: Firm pink/brown papulenodule with dimple sign at left antecubitum  Treatment Plan: A dermatofibroma is a benign growth possibly related to trauma, such as an insect bite, cut from shaving, or inflamed acne-type bump.  Treatment options to remove include shave or excision with resulting scar and risk of recurrence.  Since benign-appearing and not bothersome, will observe for now.     NEOPLASM OF UNCERTAIN BEHAVIOR OF SKIN L upper sternum - Epidermal / dermal shaving  Lesion diameter (cm):  0.3 Informed consent: discussed and consent obtained   Patient was prepped and draped in usual sterile fashion: Area prepped with alcohol. Anesthesia: the lesion was anesthetized in a standard fashion   Anesthetic:  1% lidocaine  w/ epinephrine  1-100,000 buffered w/ 8.4% NaHCO3 Instrument used: flexible razor blade   Hemostasis achieved with: pressure, aluminum chloride and electrodesiccation   Outcome: patient tolerated procedure well   Post-procedure details: wound care instructions given   Post-procedure details comment:  Ointment and small bandage applied  Specimen 1 - Surgical pathology Differential Diagnosis: Irritated Nevus vs other Check Margins: No HAND DERMATITIS   OTHER ATOPIC DERMATITIS   This Visit - Dupilumab  (DUPIXENT ) 300 MG/2ML SOAJ - Inject 300 mg into the skin every 14 (fourteen) days. Return in about 6 months (around 02/02/2025) for Atopic Dermatitis, on Dupixent .  I, Andrea Kerns, CMA, am acting as scribe for Rexene Rattler, MD .   Documentation: I have reviewed the above documentation for accuracy and completeness, and I agree with the above.  Rexene Rattler, MD

## 2024-08-05 ENCOUNTER — Other Ambulatory Visit: Payer: Self-pay

## 2024-08-07 LAB — SURGICAL PATHOLOGY

## 2024-08-11 ENCOUNTER — Ambulatory Visit: Payer: Self-pay | Admitting: Dermatology

## 2024-08-11 NOTE — Telephone Encounter (Signed)
 Patient advised of BX results. aw

## 2024-08-11 NOTE — Telephone Encounter (Signed)
-----   Message from Rexene Rattler, MD sent at 08/11/2024 10:59 AM EST ----- 1. Skin, L upper sternum :       IRRITATED, INTRADERMAL MELANOCYTIC NEVUS WITH CONGENITAL PATTERN   Benign irritated nevus - please call patient

## 2024-08-12 ENCOUNTER — Other Ambulatory Visit: Payer: Self-pay

## 2024-08-17 ENCOUNTER — Other Ambulatory Visit: Payer: Self-pay | Admitting: Pharmacist

## 2024-08-17 ENCOUNTER — Other Ambulatory Visit: Payer: Self-pay | Admitting: Pediatrics

## 2024-08-17 ENCOUNTER — Encounter: Payer: Self-pay | Admitting: Pediatrics

## 2024-08-17 ENCOUNTER — Other Ambulatory Visit: Payer: Self-pay

## 2024-08-17 ENCOUNTER — Other Ambulatory Visit (HOSPITAL_COMMUNITY): Payer: Self-pay

## 2024-08-17 DIAGNOSIS — G4700 Insomnia, unspecified: Secondary | ICD-10-CM

## 2024-08-17 DIAGNOSIS — L2089 Other atopic dermatitis: Secondary | ICD-10-CM

## 2024-08-17 MED ORDER — DUPIXENT 300 MG/2ML ~~LOC~~ SOAJ
300.0000 mg | SUBCUTANEOUS | 5 refills | Status: AC
  Filled 2024-08-17 – 2024-08-19 (×2): qty 4, 28d supply, fill #0
  Filled 2024-09-11: qty 4, 28d supply, fill #1

## 2024-08-17 NOTE — Telephone Encounter (Signed)
 Copied from CRM #[id number]. Topic: Clinical - Medication Refill >> Aug 17, 2024 10:37 AM Brittany M wrote: Medication: temazepam  (RESTORIL ) 15 MG capsule  Has the patient contacted their pharmacy? Yes (Agent: If no, request that the patient contact the pharmacy for the refill. If patient does not wish to contact the pharmacy document the reason why and proceed with request.) (Agent: If yes, when and what did the pharmacy advise?)  This is the patient's preferred pharmacy:  [facility] [address] [place] [address] Phone: [phone] Fax: [phone]    Is this the correct pharmacy for this prescription? Yes If no, delete pharmacy and type the correct one.   Has the prescription been filled recently? Yes  Is the patient out of the medication? Yes  Has the patient been seen for an appointment in the last year OR does the patient have an upcoming appointment? Yes  Can we respond through MyChart? Yes  Agent: Please be advised that Rx refills may take up to 3 business days. We ask that you follow-up with your pharmacy.

## 2024-08-17 NOTE — Telephone Encounter (Signed)
 This encounter was created in error - please disregard.

## 2024-08-18 ENCOUNTER — Other Ambulatory Visit: Payer: Self-pay

## 2024-08-19 ENCOUNTER — Other Ambulatory Visit: Payer: Self-pay

## 2024-08-19 ENCOUNTER — Other Ambulatory Visit (HOSPITAL_COMMUNITY): Payer: Self-pay

## 2024-08-19 DIAGNOSIS — G4700 Insomnia, unspecified: Secondary | ICD-10-CM

## 2024-08-19 MED ORDER — GABAPENTIN 600 MG PO TABS: 600.0000 mg | ORAL_TABLET | Freq: Three times a day (TID) | ORAL | 0 refills | Status: DC

## 2024-08-19 MED ORDER — OXYCODONE HCL 15 MG PO TABS
15.0000 mg | ORAL_TABLET | Freq: Four times a day (QID) | ORAL | 0 refills | Status: DC
  Filled 2024-08-27 (×2): qty 120, 30d supply, fill #0

## 2024-08-19 MED ORDER — METHOCARBAMOL 750 MG PO TABS
750.0000 mg | ORAL_TABLET | Freq: Three times a day (TID) | ORAL | 0 refills | Status: AC
  Filled 2024-08-22: qty 90, 30d supply, fill #0

## 2024-08-19 MED ORDER — MORPHINE SULFATE ER 30 MG PO TBCR: 30.0000 mg | EXTENDED_RELEASE_TABLET | Freq: Every day | ORAL | 0 refills | Status: AC

## 2024-08-19 MED ORDER — NALOXONE HCL 4 MG/0.1ML NA LIQD: NASAL | 0 refills | Status: AC

## 2024-08-19 NOTE — Progress Notes (Signed)
 Specialty Pharmacy Refill Coordination Note  Jamie [name] is a 53 y.o. female contacted today regarding refills of specialty medication(s) Dupilumab  (Dupixent )   Patient requested Delivery   Delivery date: 08/25/24   Verified address: [address] [place], [place] [address]   Medication will be filled on: 08/24/24

## 2024-08-19 NOTE — Telephone Encounter (Signed)
 Patient called again to find out why her script for temazepam  was not called into the pharmacy. Patient said she needs this medication to sleep and the request was put in on Monday. Please f/u with patient

## 2024-08-21 ENCOUNTER — Encounter: Payer: Self-pay | Admitting: Family Medicine

## 2024-08-21 ENCOUNTER — Other Ambulatory Visit: Payer: Self-pay

## 2024-08-21 ENCOUNTER — Ambulatory Visit: Admitting: Family Medicine

## 2024-08-21 VITALS — BP 113/69 | HR 64 | Temp 98.0°F | Ht 67.0 in | Wt 167.4 lb

## 2024-08-21 DIAGNOSIS — R9389 Abnormal findings on diagnostic imaging of other specified body structures: Secondary | ICD-10-CM | POA: Diagnosis not present

## 2024-08-21 DIAGNOSIS — E782 Mixed hyperlipidemia: Secondary | ICD-10-CM | POA: Diagnosis not present

## 2024-08-21 DIAGNOSIS — F411 Generalized anxiety disorder: Secondary | ICD-10-CM

## 2024-08-21 DIAGNOSIS — F332 Major depressive disorder, recurrent severe without psychotic features: Secondary | ICD-10-CM

## 2024-08-21 DIAGNOSIS — G4700 Insomnia, unspecified: Secondary | ICD-10-CM | POA: Diagnosis not present

## 2024-08-21 MED ORDER — CAPLYTA 21 MG PO CAPS
21.0000 mg | ORAL_CAPSULE | Freq: Every day | ORAL | 1 refills | Status: AC
  Filled 2024-08-21: qty 30, 30d supply, fill #0

## 2024-08-21 MED ORDER — TEMAZEPAM 15 MG PO CAPS
15.0000 mg | ORAL_CAPSULE | Freq: Every evening | ORAL | 0 refills | Status: AC | PRN
  Filled 2024-08-21: qty 30, 30d supply, fill #0

## 2024-08-21 MED ORDER — DOXEPIN HCL 3 MG PO TABS
3.0000 mg | ORAL_TABLET | Freq: Every evening | ORAL | 3 refills | Status: AC | PRN
  Filled 2024-08-21 – 2024-08-22 (×2): qty 60, 30d supply, fill #0

## 2024-08-21 NOTE — Telephone Encounter (Signed)
 Overdue for appointment. I did not write this medication and she was supposed to follow up in early December.

## 2024-08-21 NOTE — Telephone Encounter (Signed)
Pt has appt today with pcp

## 2024-08-21 NOTE — Progress Notes (Addendum)
 "  BP 113/69   Pulse 64   Temp 98 F (36.7 C) (Oral)   Ht 5' 7 (1.702 m)   Wt 167 lb 6.4 oz (75.9 kg)   LMP 06/20/2016   SpO2 98%   BMI 26.22 kg/m    Subjective:    Patient ID: Jamie [name], female    DOB: 04-Apr-1971, 54 y.o.   MRN: [id number]  HPI: Blue Ruggerio is a 54 y.o. female  Chief Complaint  Patient presents with   Insomnia   Stress   Depression   ANXIETY/DEPRESSION- has a lot going on. Has lost her job recently and is caring for her parents who are failing and have dementia. She has not heard from psychiatry and has not been able to get in with counseling.  Duration: chronic Status:exacerbated Anxious mood: yes  Excessive worrying: yes Irritability: yes  Sweating: no Nausea: no Palpitations:no Hyperventilation: no Panic attacks: yes Agoraphobia: yes  Obscessions/compulsions: yes Depressed mood: yes    08/21/2024    1:39 PM 04/15/2024    8:04 AM 02/19/2024    2:19 PM 04/15/2023   11:17 AM 01/18/2023    4:09 PM  Depression screen PHQ 2/9  Decreased Interest 3 2 2 2 2   Down, Depressed, Hopeless 3 2 2 2 3   PHQ - 2 Score 6 4 4 4 5   Altered sleeping 3 3 3 3 3   Tired, decreased energy 3 1 2 1 2   Change in appetite 3 1 2 2 3   Feeling bad or failure about yourself  3 2 2 2 3   Trouble concentrating 3 3 3 3 3   Moving slowly or fidgety/restless 3 3 3 3 3   Suicidal thoughts 0 0 0 0 0  PHQ-9 Score 24 17  19  18  22    Difficult doing work/chores Extremely dIfficult Somewhat difficult Somewhat difficult Somewhat difficult Somewhat difficult     Data saved with a previous flowsheet row definition      08/21/2024    1:40 PM 04/15/2024    8:05 AM 02/19/2024    2:20 PM 04/15/2023   11:17 AM  GAD 7 : Generalized Anxiety Score  Nervous, Anxious, on Edge 3 3 3 3   Control/stop worrying 3 2 2 2   Worry too much - different things 3 3 3 3   Trouble relaxing 3 3 3 3   Restless 3 3 3 3   Easily annoyed or irritable 3 3 2 2   Afraid - awful might happen 2 2 2 2   Total GAD 7  Score 20 19 18 18   Anxiety Difficulty Extremely difficult Somewhat difficult Somewhat difficult Somewhat difficult   Anhedonia: yes Weight changes: no Insomnia: yes hard to fall asleep  Hypersomnia: yes Fatigue/loss of energy: yes Feelings of worthlessness: yes Feelings of guilt: yes Impaired concentration/indecisiveness: yes Suicidal ideations: no  Crying spells: yes Recent Stressors/Life Changes: yes   Relationship problems: no   Family stress: yes     Financial stress: no    Job stress: yes    Recent death/loss: no  INSOMNIA Duration: chronic Satisfied with sleep quality: no Difficulty falling asleep: yes Difficulty staying asleep: yes Waking a few hours after sleep onset: yes Early morning awakenings: yes Daytime hypersomnolence: yes Wakes feeling refreshed: no Good sleep hygiene: no Apnea: no Snoring: no Depressed/anxious mood: yes Recent stress: yes Restless legs/nocturnal leg cramps: no Chronic pain/arthritis: yes History of sleep study: yes Treatments attempted: temazepam , doxepin , melatonin, uinsom, and benadryl     Relevant past medical, surgical, family and social  history reviewed and updated as indicated. Interim medical history since our last visit reviewed. Allergies and medications reviewed and updated.  Review of Systems  Constitutional: Negative.   Respiratory: Negative.    Cardiovascular: Negative.   Musculoskeletal:  Positive for arthralgias, back pain and myalgias. Negative for gait problem, joint swelling, neck pain and neck stiffness.  Skin: Negative.   Neurological: Negative.   Psychiatric/Behavioral:  Positive for decreased concentration, dysphoric mood and sleep disturbance. Negative for agitation, behavioral problems, confusion, hallucinations, self-injury and suicidal ideas. The patient is nervous/anxious. The patient is not hyperactive.     Per HPI unless specifically indicated above     Objective:    BP 113/69   Pulse 64   Temp  98 F (36.7 C) (Oral)   Ht 5' 7 (1.702 m)   Wt 167 lb 6.4 oz (75.9 kg)   LMP 06/20/2016   SpO2 98%   BMI 26.22 kg/m   Wt Readings from Last 3 Encounters:  08/21/24 167 lb 6.4 oz (75.9 kg)  07/08/24 170 lb (77.1 kg)  07/07/24 170 lb 3.2 oz (77.2 kg)    Physical Exam Vitals and nursing note reviewed.  Constitutional:      General: She is not in acute distress.    Appearance: Normal appearance. She is not ill-appearing, toxic-appearing or diaphoretic.  HENT:     Head: Normocephalic and atraumatic.     Right Ear: External ear normal.     Left Ear: External ear normal.     Nose: Nose normal.     Mouth/Throat:     Mouth: Mucous membranes are moist.     Pharynx: Oropharynx is clear.  Eyes:     General: No scleral icterus.       Right eye: No discharge.        Left eye: No discharge.     Extraocular Movements: Extraocular movements intact.     Conjunctiva/sclera: Conjunctivae normal.     Pupils: Pupils are equal, round, and reactive to light.  Cardiovascular:     Rate and Rhythm: Normal rate and regular rhythm.     Pulses: Normal pulses.     Heart sounds: Normal heart sounds. No murmur heard.    No friction rub. No gallop.  Pulmonary:     Effort: Pulmonary effort is normal. No respiratory distress.     Breath sounds: Normal breath sounds. No stridor. No wheezing, rhonchi or rales.  Chest:     Chest wall: No tenderness.  Musculoskeletal:        General: Normal range of motion.     Cervical back: Normal range of motion and neck supple.  Skin:    General: Skin is warm and dry.     Capillary Refill: Capillary refill takes less than 2 seconds.     Coloration: Skin is not jaundiced or pale.     Findings: No bruising, erythema, lesion or rash.  Neurological:     General: No focal deficit present.     Mental Status: She is alert and oriented to person, place, and time. Mental status is at baseline.  Psychiatric:        Mood and Affect: Mood is depressed. Affect is tearful.         Behavior: Behavior normal.        Thought Content: Thought content normal.        Judgment: Judgment normal.     Results for orders placed or performed in visit on 08/04/24  Surgical pathology  Collection Time: 08/04/24 12:00 AM  Result Value Ref Range   SURGICAL PATHOLOGY      SURGICAL PATHOLOGY [facility] [address], [address] [place], [place] [address] Telephone [phone] or [phone] Fax [phone]  REPORT OF DERMATOPATHOLOGY   Accession #: [id number] Patient Name: JIMI, GIZA Visit # : [id number]  MRN: [id number] Physician: Jackquline Sawyer DOB/Age [birth year] (Age: 76) Gender: F Collected Date: 08/04/2024 Received Date: 08/05/2024  FINAL DIAGNOSIS       1. Skin, L upper sternum :       IRRITATED, INTRADERMAL MELANOCYTIC NEVUS WITH CONGENITAL PATTERN       DATE SIGNED OUT: 08/07/2024 ELECTRONIC SIGNATURE : DIONA M.D., SUEZANNE, Dermatopathologist  MICROSCOPIC DESCRIPTION 1. There is a proliferation of banal melanocytes predominantly in the dermis. There is no atypia.  CASE COMMENTS STAINS USED IN DIAGNOSIS: H&E    CLINICAL HISTORY  SPECIMEN(S) OBTAINED 1. Skin, L Upper Sternum  SPECIMEN COMMENTS: 1. 3 mm flesh papule SPECIMEN CLINICAL INFORMATION: 1. Neoplasm of uncertain beh avior of skin, irritated nevus vs other    Gross Description 1. Formalin fixed specimen received:  6 X 3 X 1 MM, TOTO (2 P) (1 B) ( QB )        Report signed out from the following location(s) [place] [address] [place], [place] [address] CLIA #: [id number]  [facility] [address] [place], [place] [address] CLIA #: [id number]       Assessment & Plan:   Problem List Items Addressed This Visit       Other   Insomnia   Sleeping better on her temazepam . I am not comfortable keeping her on this long-term due to her opiate use. Will continue for now and recheck in 3-4 weeks.  Will try to get her into psychiatry ASAP.       Relevant Medications   temazepam  (RESTORIL ) 15 MG capsule   GAD (generalized anxiety disorder)   Relevant Medications   Doxepin  HCl 3 MG TABS   Recurrent major depression-severe (HCC) - Primary   Did not tolerate her viibryd . Has failed several SSRIs and SNRIs. Has not heard from psychiatry. Has not been able to get in with counseling. Will reach out to [facility] to see if she can be seen there to bridge until she gets in with another counselor. List of counselors given today. Reached out to psychiatry but their office was closed. We will reach out on Monday. May need to change provider offices depending on availability. Will start her on caplyta. Recheck 3-4 weeks.       Mixed hyperlipidemia   Has previously declined medication- age advanced vascular calcifications found on lumbar x-ray, CAD evaluation recommended. Will recheck cholesterol next visit- discussed starting medicine as first line treatment. BP good even with stress. Will get her into cardiology for ?CT calcium score. Await their input.       Relevant Orders   Ambulatory referral to Cardiology   Other Visit Diagnoses       Abnormal finding on imaging       See discussion under hyperlipidemia.   Relevant Orders   Ambulatory referral to Cardiology        Follow up plan: Return 3-4 weeks.      "

## 2024-08-21 NOTE — Assessment & Plan Note (Signed)
 Has previously declined medication- age advanced vascular calcifications found on lumbar x-ray, CAD evaluation recommended. Will recheck cholesterol next visit- discussed starting medicine as first line treatment. BP good even with stress. Will get her into cardiology for ?CT calcium score. Await their input.

## 2024-08-21 NOTE — Telephone Encounter (Signed)
 Can this be sent in ASAP for the patient? Last sent in 07/07/24.

## 2024-08-21 NOTE — Addendum Note (Signed)
 Addended by: VICCI DUWAINE SQUIBB on: 08/21/2024 02:13 PM   Modules accepted: Orders

## 2024-08-21 NOTE — Assessment & Plan Note (Signed)
 Did not tolerate her viibryd . Has failed several SSRIs and SNRIs. Has not heard from psychiatry. Has not been able to get in with counseling. Will reach out to [facility] to see if she can be seen there to bridge until she gets in with another counselor. List of counselors given today. Reached out to psychiatry but their office was closed. We will reach out on Monday. May need to change provider offices depending on availability. Will start her on caplyta. Recheck 3-4 weeks.

## 2024-08-21 NOTE — Assessment & Plan Note (Signed)
 Sleeping better on her temazepam . I am not comfortable keeping her on this long-term due to her opiate use. Will continue for now and recheck in 3-4 weeks. Will try to get her into psychiatry ASAP.

## 2024-08-21 NOTE — Telephone Encounter (Signed)
 Patient states that she had to reschedule her appointment due to a funeral.  Patient states that she was told that an appointment was not available until February for a [facility] visit with Dr. Vicci. Patient states that the medication helps her sleep and is requesting a refill to last until her [facility] appointment.

## 2024-08-22 ENCOUNTER — Other Ambulatory Visit (HOSPITAL_COMMUNITY): Payer: Self-pay

## 2024-08-24 ENCOUNTER — Other Ambulatory Visit: Payer: Self-pay

## 2024-08-24 ENCOUNTER — Telehealth: Payer: Self-pay

## 2024-08-24 VITALS — BP 106/56 | HR 78 | Ht 67.0 in | Wt 168.4 lb

## 2024-08-24 DIAGNOSIS — M1612 Unilateral primary osteoarthritis, left hip: Secondary | ICD-10-CM

## 2024-08-24 NOTE — Progress Notes (Signed)
 "  Office Visit Note   Patient: Jamie [name]           Date of Birth: 11/14/1970           MRN: [id number] Visit Date: 08/24/2024              Requested by: Vicci Duwaine SQUIBB, DO [address] [place],  [place] [address] PCP: Vicci Duwaine SQUIBB, DO   Assessment & Plan: Visit Diagnoses:  1. Arthritis of left hip     Plan: Natural history and expected course discussed. Questions answered. Discussed at length the patients condition and treatment options. Patient has severe left hip arthritis and has failed conservative management. Discussed hip replacement surgery with patient; after discussing the details of surgery, the risks(which include complications from anesthesia, bleeding, infection, blood clots, leg length discrepancy, nerve damage, blood vessel and ligament damage, risk of loosening or wearing out of implants, dislocation of the prosthesis, and heart attack, stroke, or death), and the benefits, the patient has opted to proceed with hip replacement surgery. Will perform through direct anterior approach. Advised the patient that they will need clearance from their primary care physician and cardiologist prior to surgery. Will see for preoperative visit 1-2 weeks before surgery.  Orders:  No orders of the defined types were placed in this encounter.    Subjective: Chief Complaint: Left hip pain  HPI Patient is a 54 y.o. year old female who complains of left hip pain. Onset of the symptoms was several years ago. Inciting event: none. Current symptoms include groin pain. Aggravating symptoms: any weight bearing. Patient's course of pain: gradually worsening. Patient has had prior history of hip problems. Has had buttock pain and previously had lumbar fusion. Treatment to date: none.  Objective: Vital Signs: BP (!) 106/56 (Cuff Size: Normal)   Pulse 78   Ht 5' 7 (1.702 m)   Wt 168 lb 6.4 oz (76.4 kg)   LMP 06/20/2016   BMI 26.38 kg/m   Physical Exam Gen: Alert, No Acute Distress left  hip: Skin intact, no erythema or induration noted. groin tenderness to palpation. Limited range of motion. positive log roll  Imaging: Radiographs personally reviewed by me; reveal severe osteoarthritis of the left hip   PMFS History: Patient Active Problem List   Diagnosis Date Noted   Mixed hyperlipidemia 08/21/2024   Generalized headaches 02/19/2024   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   S/P lumbar fusion 03/11/2023   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, 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   Recurrent major depression-severe (HCC) 10/19/2020   GAD (generalized anxiety disorder) 04/26/2020   Panic attack 04/26/2020   At risk for long QT syndrome 04/26/2020   DDD (degenerative disc disease), lumbar 05/12/2019   Allergic rhinitis 05/11/2019   Primary osteoarthritis of right knee 03/20/2018   Insomnia 08/24/2017   Chronic back pain 10/16/2016   Pseudoarthrosis of lumbar spine 08/11/2015   Eczema 05/09/2015   Spinal stenosis of lumbar region 09/01/2014   Spondylolisthesis 08/03/2014   Displacement of lumbar intervertebral disc without myelopathy 08/03/2014   Past Medical History:  Diagnosis Date   Allergic rhinitis    Allergy 1980   Anxiety    takes Ativan  daily.  Panic Attack   Arthritis    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, unspecified (HCC)    At risk for long QT syndrome    Chronic back pain    herniated disc/stenosis/scoliosis   DDD (degenerative disc disease), lumbar    Depression    takes Effexor  daily   Displacement of lumbar intervertebral  disc without myelopathy    Eczema    uses a cream daily as needed   Family history of adverse reaction to anesthesia    pta dad is very hard to wake up;excessive nausea   GAD (generalized anxiety disorder)    Generalized body aches    History of bronchitis    > 18yr ago   Hx of dysplastic nevus 05/10/2020   neck - posterior, Severe Atypia. Shave removal 07/18/2020, margins free   Hypoglycemia    Insomnia    takes Lorazepam  nightly    Joint pain    Panic attacks    PONV (postoperative nausea and vomiting)    Primary osteoarthritis of right knee    Pseudoarthrosis of lumbar spine    Recurrent major depression-severe (HCC)    Seasonal allergies    uses Flonase  daily   Spinal stenosis of lumbar region    Spondylolisthesis    Torn rotator cuff    Vitamin D  deficiency    takes Vit D daily   Wears contact lenses     Family History  Problem Relation Age of Onset   Arthritis Mother    Hyperlipidemia Mother    Migraines Mother    Arthritis Father    Diabetes Father    Heart disease Father    Atrial fibrillation Father    Diabetes Brother    Breast cancer Maternal Grandmother    Mental illness Neg Hx     Past Surgical History:  Procedure Laterality Date   APPLICATION OF INTRAOPERATIVE CT SCAN N/A 03/11/2023   Procedure: APPLICATION OF INTRAOPERATIVE CT SCAN;  Surgeon: Clois Fret, MD;  Location: [facility];  Service: Neurosurgery;  Laterality: N/A;   BACK SURGERY  2008/2012, x2 in 2016   laminectomy 1st time and 2nd time laminectomy and fusion   COLONOSCOPY WITH PROPOFOL  N/A 05/27/2020   Procedure: COLONOSCOPY WITH PROPOFOL ;  Surgeon: Janalyn Keene NOVAK, MD;  Location: [facility];  Service: Gastroenterology;  Laterality: N/A;  COVID POSITIVE AUGUST 16   MAXIMUM ACCESS (MAS)POSTERIOR LUMBAR INTERBODY FUSION (PLIF) 2 LEVEL N/A 09/01/2014   Procedure: Lumbar four-five, Lumbar five-Sacral one Maximum Access Surgery posterior lumbar interbody fusion with interbody prosthesis posterior lateral arthrodesis posterior segmental instrumentation;  Surgeon: Arley SHAUNNA Helling, MD;  Location: [facility];  Service: Neurosurgery;  Laterality: N/A;  Lumbar four-five, Lumbar five-Sacral one Maximum Access Surgery posterior lumbar interbody fusion with i   SHOULDER ARTHROSCOPY WITH SUBACROMIAL DECOMPRESSION AND OPEN ROTATOR C Right 08/18/2021   Procedure: Right shoulder arthroscopic rotator cuff repair, biceps tenodesis,  subacromial decompression;  Surgeon: Tobie Priest, MD;  Location: [facility];  Service: Orthopedics;  Laterality: Right;   SPINE SURGERY  X 5   Last 03/11/23   Social History   Occupational History   Not on file  Tobacco Use   Smoking status: Former    Current packs/day: 0.00    Average packs/day: 0.5 packs/day for 20.0 years (10.0 ttl pk-yrs)    Types: Cigarettes    Start date: 10/18/1996    Quit date: 10/18/2016    Years since quitting: 7.8   Smokeless tobacco: Never   Tobacco comments:    Already quit  Vaping Use   Vaping status: Never Used  Substance and Sexual Activity   Alcohol use: No   Drug use: No   Sexual activity: Not Currently    Birth control/protection: None   Current Outpatient Medications  Medication Instructions   Caplyta  21 mg, Oral, Daily   cholecalciferol  (VITAMIN D )  1,000 Units, Daily   clobetasol  cream (TEMOVATE ) 0.05 % Apply 1 Application topically as directed once daily to affected areas of  hands and feet as needed for flares, avoid face, groin, axilla.   Doxepin  HCl 3-6 mg, Oral, At bedtime PRN   Dupixent  300 mg, Subcutaneous, Every 14 days   fluticasone  (FLONASE ) 50 MCG/ACT nasal spray 1 spray, Each Nare, Daily PRN   gabapentin  (NEURONTIN ) 600 mg, Oral, 3 times daily   gabapentin  (NEURONTIN ) 600 mg, Oral, 3 times daily   gabapentin  (NEURONTIN ) 600 mg, Oral, 3 times daily   gabapentin  (NEURONTIN ) 600 mg, Oral, 3 times daily   Halobetasol  Prop-Tazarotene (DUOBRII ) 0.01-0.045 % LOTN 1 application , Apply externally, Daily at bedtime, qhs to aa rash on hands and feet until clear, then prn flares, avoid face, groin, axilla   methocarbamol  (ROBAXIN ) 500 mg, Oral, 3 times daily   methocarbamol  (ROBAXIN ) 500 mg, Oral, 3 times daily   methocarbamol  (ROBAXIN ) 750 mg, Oral, 3 times daily   montelukast  (SINGULAIR ) 10 mg, Oral, Daily at bedtime   morphine  (MS CONTIN ) 30 mg, Oral, Daily   morphine  (MS CONTIN ) 30 mg, Oral, Daily   morphine  (MS CONTIN ) 30  mg, Oral, Daily   morphine  (MS CONTIN ) 30 mg, Oral, Daily   Multiple Vitamin (MULTIVITAMIN ADULT PO) Daily   naloxone  (NARCAN ) nasal spray 4 mg/0.1 mL Place 1 spray into 1 nostril every 2 minutes (alternating nostrils with each dose) as needed for opioid overdose until help arrives.   oxyCODONE  (ROXICODONE ) 15 mg, Oral, Every 6 hours   rizatriptan  (MAXALT ) 10 mg, Oral, As needed, May repeat in 2 hours if needed   tacrolimus  (PROTOPIC ) 0.1 % ointment Apply topically daily at bedtime to hands and feet   Tapinarof  (VTAMA ) 1 % CREA Apply once daily to aa hands, feet   Tavaborole  5 % SOLN Apply qhs to affected toenails   temazepam  (RESTORIL ) 15 mg, Oral, At bedtime PRN   Allergies as of 08/24/2024 - Review Complete 08/21/2024  Allergen Reaction Noted   Amoxicillin  Diarrhea, Nausea And Vomiting, and Shortness Of Breath 08/23/2021   Shellfish allergy Hives 08/18/2014   Flexeril  [cyclobenzaprine ] Hives 08/18/2014   Wound dressing adhesive Hives and Itching 03/11/2023   "

## 2024-08-24 NOTE — Telephone Encounter (Signed)
-----   Message from Duwaine Louder, [place] sent at 08/21/2024  1:56 PM EST ----- Can we please check on referral to psych- has not heard anything. Can we also check in to see roughly how far out they're booking because if it's months out I'll refer her elsewhere

## 2024-08-24 NOTE — Telephone Encounter (Signed)
 Contacted [facility] and [facility] asking for them to please return my call. Left patients name and DOB in the message and asked for a return call with the status of the referral and how far their appointment were booking out.   OK for E2C2 to speak to them and find out the above information if they call back.

## 2024-08-25 NOTE — Progress Notes (Unsigned)
" °  Cardiology Office Note:  .   Date:  08/26/2024  ID:  Jamie [name], DOB 10-Jun-1971, MRN [id number] PCP: Vicci Duwaine SQUIBB, DO  [facility] Providers Cardiologist:  None     History of Present Illness: .   Discussed the use of AI scribe software for clinical note transcription with the patient, who gave verbal consent to proceed.  Jamie [name] is a 54 y.o. female history of hypertension, depression, and anxiety, who has been referred for evaluation of hyperlipidemia and vascular calcifications noted on the lumbar radiograph.  She also requests preoperative cardiovascular risk assessment in anticipation of back and hip surgery. She experiences significant back and hip pain, which affects her mobility. Despite this, she is [name] to perform housework and climb stairs without experiencing chest pain or shortness of breath. She underwent back surgery on March 12, 2024, but has not yet achieved fusion.  She denies a history of heart troubles, chest pain, shortness of breath, leg swelling, dizziness, or syncope. She has not been on cholesterol medication before.     ROS: See HPI  Studies Reviewed: SABRA   EKG Interpretation Date/Time:  Wednesday August 26 2024 10:51:01 EST Ventricular Rate:  82 PR Interval:  124 QRS Duration:  74 QT Interval:  364 QTC Calculation: 425 R Axis:   26  Text Interpretation: Normal sinus rhythm Normal ECG No previous ECGs available Confirmed by Felesia Stahlecker, Lonni [id number]) on 08/26/2024 11:04:44 AM    Risk Assessment/Calculations:             Physical Exam:   VS:  BP 110/72 (BP Location: Left Arm, Patient Position: Sitting, Cuff Size: Normal)   Pulse 82   Ht 5' 7 (1.702 m)   Wt 167 lb 9.6 oz (76 kg)   LMP 06/20/2016   SpO2 98%   BMI 26.25 kg/m    Wt Readings from Last 3 Encounters:  08/26/24 167 lb 9.6 oz (76 kg)  08/24/24 168 lb 6.4 oz (76.4 kg)  08/21/24 167 lb 6.4 oz (75.9 kg)    General:  NAD. Neck: No JVD or HJR. Lungs: Clear to auscultation  bilaterally without wheezes or crackles. Heart: Regular rate and rhythm without murmurs, rubs, or gallops. Abdomen: Soft, nontender, nondistended. Extremities: No lower extremity edema.  ASSESSMENT AND PLAN: .    Aortic atherosclerosis: I have personally reviewed lumbar spine radiographs and CT scans, demonstrating atherosclerotic calcification of the aortic bifurcation and bilateral common iliac arteries.  We discussed the role for coronary calcium  scoring for further risk stratification.  However, given her moderately elevated LDL and evidence of ASCVD, we have agreed to forego additional testing at this time and instead initiate rosuvastatin  10 mg daily.  I will recheck an ALT today, as it was mildly elevated on the last check in August.  We will plan to repeat a lipid panel and ALT in 3 months to assess her response to therapy.  Hypertension: Blood pressure well-controlled today off pharmacotherapy.  Preoperative cardiovascular risk assessment: Jamie [name] is planning to undergo lumbar spine and hip surgeries in the near future.  Despite her chronic pain, she is [name] to perform at least 4 METS of activity without chest pain, dyspnea, or other worrisome symptoms.  I think it is reasonable for her to proceed with these low-intermediate risk surgical procedures without further cardiac testing or intervention.    Dispo: Return to clinic in 3 months (after lipid panel/ALT).  Signed, Lonni Hanson, MD  "

## 2024-08-26 ENCOUNTER — Other Ambulatory Visit (HOSPITAL_COMMUNITY): Payer: Self-pay

## 2024-08-26 ENCOUNTER — Ambulatory Visit: Attending: Internal Medicine | Admitting: Internal Medicine

## 2024-08-26 ENCOUNTER — Other Ambulatory Visit: Payer: Self-pay

## 2024-08-26 ENCOUNTER — Encounter: Payer: Self-pay | Admitting: Internal Medicine

## 2024-08-26 VITALS — BP 110/72 | HR 82 | Ht 67.0 in | Wt 167.6 lb

## 2024-08-26 DIAGNOSIS — Z79899 Other long term (current) drug therapy: Secondary | ICD-10-CM

## 2024-08-26 DIAGNOSIS — E782 Mixed hyperlipidemia: Secondary | ICD-10-CM | POA: Diagnosis not present

## 2024-08-26 MED ORDER — ROSUVASTATIN CALCIUM 10 MG PO TABS
10.0000 mg | ORAL_TABLET | Freq: Every day | ORAL | 3 refills | Status: AC
  Filled 2024-08-26 (×2): qty 90, 90d supply, fill #0

## 2024-08-26 NOTE — Patient Instructions (Signed)
 Medication Instructions:  Your physician recommends the following medication changes.  START TAKING: Rosuvastatin  10 mg by mouth daily   *If you need a refill on your cardiac medications before your next appointment, please call your pharmacy*  Lab Work: Your provider would like for you to have following labs drawn today ALT.    Your provider would like for you to return in April, 2026 to have the following labs drawn: Lipid, ALT.   Please go to [facility] [address] ([facility] [address], [place] [address] You do not need an appointment.  They are open from 8 am- 4:30 pm.  Lunch from 1:00 pm- 2:00 pm You will need to be fasting.    Testing/Procedures: No test ordered today   Follow-Up: At [facility], you and your health needs are our priority.  As part of our continuing mission to provide you with exceptional heart care, our providers are all part of one team.  This team includes your primary Cardiologist (physician) and Advanced Practice Providers or APPs (Physician Assistants and Nurse Practitioners) who all work together to provide you with the care you need, when you need it.  Your next appointment:   3 month(s)  Provider:   You may see Lonni Hanson, MD or one of the following Advanced Practice Providers on your designated Care Team:   Lonni Meager, NP Lesley Maffucci, PA-C Bernardino Bring, PA-C Cadence Miamiville, PA-C Tylene Lunch, NP Barnie Hila, NP

## 2024-08-27 ENCOUNTER — Ambulatory Visit: Payer: Self-pay | Admitting: Internal Medicine

## 2024-08-27 ENCOUNTER — Telehealth: Payer: Self-pay | Admitting: Internal Medicine

## 2024-08-27 ENCOUNTER — Other Ambulatory Visit (HOSPITAL_COMMUNITY): Payer: Self-pay

## 2024-08-27 LAB — ALT: ALT: 12 IU/L (ref 0–32)

## 2024-08-27 NOTE — Telephone Encounter (Signed)
"  ° °  Pre-operative Risk Assessment    Patient Name: Jamie [name]  DOB: 04-16-1971 MRN: [id number]   Date of last office visit: 08/26/2024 Date of next office visit: 11/24/2024   Request for Surgical Clearance    Procedure:  Left total hip arthroplasty  Date of Surgery:  Clearance TBD                                Surgeon:  Dr. Thamas Ike Surgeon's Group or Practice Name:  [facility] Phone number:  [phone] Fax number:  [phone]   Type of Clearance Requested:   - Medical    Type of Anesthesia:  not indicated    Additional requests/questions:    SignedTinnie NOVAK Schools   08/27/2024, 4:41 PM   "

## 2024-08-27 NOTE — Telephone Encounter (Signed)
 Tried [employer] again. Left another VM asking for a returned call with the status of the patient's referral and how far their new patient appointments were going out.   OK for E2C2 to speak to them and find out the above information if they call back.

## 2024-08-27 NOTE — Telephone Encounter (Signed)
 Pt is scheduled for 2/16 per Ellouise at the office.  She said if patients need an appt and have a referral just give them the office telephone number and have them call her.

## 2024-08-28 NOTE — Telephone Encounter (Signed)
"  ° °  Patient Name: Jamie [name]  DOB: 1970-10-12 MRN: [id number]  Primary Cardiologist: None  Chart reviewed as part of pre-operative protocol coverage. Given past medical history and time since last visit, based on ACC/AHA guidelines, Jamie [name] is at acceptable risk for the planned procedure without further cardiovascular testing.   Patient was seen by Dr. Lonni End in cardiology office on 08/26/2024, according to office note Preoperative cardiovascular risk assessment: Jamie [name] is planning to undergo lumbar spine and hip surgeries in the near future.  Despite her chronic pain, she is able to perform at least 4 METS of activity without chest pain, dyspnea, or other worrisome symptoms.  I think it is reasonable for her to proceed with these low-intermediate risk surgical procedures without further cardiac testing or intervention.  I will route this recommendation to the requesting party via Epic fax function and remove from pre-op pool.  Please call with questions.  Jamie [name], [place] 08/28/2024, 8:33 AM  "

## 2024-08-31 ENCOUNTER — Other Ambulatory Visit: Payer: Self-pay

## 2024-09-09 ENCOUNTER — Other Ambulatory Visit: Payer: Self-pay

## 2024-09-09 ENCOUNTER — Encounter
Admission: RE | Admit: 2024-09-09 | Discharge: 2024-09-09 | Disposition: A | Discharge status: Home / Self Care | Source: Ambulatory Visit

## 2024-09-09 ENCOUNTER — Ambulatory Visit

## 2024-09-09 VITALS — BP 113/77 | HR 80 | Resp 14 | Ht 67.0 in | Wt 166.0 lb

## 2024-09-09 DIAGNOSIS — E782 Mixed hyperlipidemia: Secondary | ICD-10-CM | POA: Diagnosis not present

## 2024-09-09 DIAGNOSIS — M1612 Unilateral primary osteoarthritis, left hip: Secondary | ICD-10-CM | POA: Diagnosis not present

## 2024-09-09 DIAGNOSIS — Z01812 Encounter for preprocedural laboratory examination: Secondary | ICD-10-CM | POA: Diagnosis present

## 2024-09-09 HISTORY — DX: Atherosclerosis of aorta: I70.0

## 2024-09-09 HISTORY — DX: Arthrodesis status: Z98.1

## 2024-09-09 HISTORY — DX: Unilateral primary osteoarthritis, left hip: M16.12

## 2024-09-09 LAB — SURGICAL PCR SCREEN
MRSA, PCR: NEGATIVE
Staphylococcus aureus: NEGATIVE

## 2024-09-09 LAB — TYPE AND SCREEN
ABO/RH(D): A POS
Antibody Screen: NEGATIVE

## 2024-09-09 LAB — URINALYSIS, COMPLETE (UACMP) WITH MICROSCOPIC
Bilirubin Urine: NEGATIVE
Glucose, UA: NEGATIVE mg/dL
Ketones, ur: NEGATIVE mg/dL
Leukocytes,Ua: NEGATIVE
Nitrite: NEGATIVE
Protein, ur: NEGATIVE mg/dL
Specific Gravity, Urine: 1.017 (ref 1.005–1.030)
pH: 5 (ref 5.0–8.0)

## 2024-09-09 LAB — CBC
HCT: 39.6 % (ref 36.0–46.0)
Hemoglobin: 13.4 g/dL (ref 12.0–15.0)
MCH: 31.6 pg (ref 26.0–34.0)
MCHC: 33.8 g/dL (ref 30.0–36.0)
MCV: 93.4 fL (ref 80.0–100.0)
Platelets: 227 K/uL (ref 150–400)
RBC: 4.24 MIL/uL (ref 3.87–5.11)
RDW: 12.7 % (ref 11.5–15.5)
WBC: 8.1 K/uL (ref 4.0–10.5)
nRBC: 0 % (ref 0.0–0.2)

## 2024-09-09 LAB — BASIC METABOLIC PANEL WITH GFR
Anion gap: 11 (ref 5–15)
BUN: 11 mg/dL (ref 6–20)
CO2: 24 mmol/L (ref 22–32)
Calcium: 9.4 mg/dL (ref 8.9–10.3)
Chloride: 105 mmol/L (ref 98–111)
Creatinine, Ser: 0.63 mg/dL (ref 0.44–1.00)
GFR, Estimated: >60 mL/min
Glucose, Bld: 84 mg/dL (ref 70–99)
Potassium: 3.9 mmol/L (ref 3.5–5.1)
Sodium: 139 mmol/L (ref 135–145)

## 2024-09-09 NOTE — Progress Notes (Signed)
 No orders-secure chat with Bigby. He looked at secure chat but no orders were placed. Unable to get consent signed. Anesthesia orders placed

## 2024-09-09 NOTE — Patient Instructions (Addendum)
 Your procedure is scheduled on:09-21-24 Monday Report to the Registration Desk on the 1st floor of the [facility].Then proceed to the 2nd floor Surgery Desk To find out your arrival time, please call [phone] between 1PM - 3PM on:09-18-24 Friday If your arrival time is 6:00 am, do not arrive before that time as the [facility] entrance doors do not open until 6:00 am.  REMEMBER: Instructions that are not followed completely may result in serious medical risk, up to and including death; or upon the discretion of your surgeon and anesthesiologist your surgery may need to be rescheduled.  Do not eat food after midnight the night before surgery.  No gum chewing or hard candies.  You may however, drink CLEAR liquids up to 2 hours before you are scheduled to arrive for your surgery. Do not drink anything within 2 hours of your scheduled arrival time.  Clear liquids include: - water  - apple juice without pulp - gatorade (not RED colors) - black coffee or tea (Do NOT add milk or creamers to the coffee or tea) Do NOT drink anything that is not on this list.  One week prior to surgery:Last dose will be on 09-13-24 (Sunday) Stop Anti-inflammatories (NSAIDS) such as Advil, Aleve, Ibuprofen, Motrin, Naproxen, Naprosyn and Aspirin  based products such as Excedrin, Goody's Powder, BC Powder. Stop ANY OVER THE COUNTER supplements until after surgery.  You may however, continue to take Tylenol  if needed for pain up until the day of surgery.  Continue taking all of your other prescription medications up until the day of surgery.  ON THE DAY OF SURGERY ONLY TAKE THESE MEDICATIONS WITH SIPS OF WATER: -morphine  (MS CONTIN )  -oxyCODONE  (ROXICODONE )   No Alcohol for 24 hours before or after surgery.  No Smoking including e-cigarettes for 24 hours before surgery.  No chewable tobacco products for at least 6 hours before surgery.  No nicotine  patches on the day of surgery.  Do not use any  recreational drugs for at least a week (preferably 2 weeks) before your surgery.  Please be advised that the combination of cocaine and anesthesia may have negative outcomes, up to and including death. If you test positive for cocaine, your surgery will be cancelled.  On the morning of surgery brush your teeth with toothpaste and water, you may rinse your mouth with mouthwash if you wish. Do not swallow any toothpaste or mouthwash.  Use CHG Soap as directed on instruction sheet.  Do not wear jewelry, make-up, hairpins, clips or nail polish.  For welded (permanent) jewelry: bracelets, anklets, waist bands, etc.  Please have this removed prior to surgery.  If it is not removed, there is a chance that hospital personnel will need to cut it off on the day of surgery.  Do not wear lotions, powders, or perfumes.   Do not shave body hair from the neck down 48 hours before surgery.  Contact lenses, hearing aids and dentures may not be worn into surgery.  Do not bring valuables to the hospital. [facility] is not responsible for any missing/lost belongings or valuables.   Notify your doctor if there is any change in your medical condition (cold, fever, infection).  Wear comfortable clothing (specific to your surgery type) to the hospital.  After surgery, you can help prevent lung complications by doing breathing exercises.  Take deep breaths and cough every 1-2 hours. Your doctor may order a device called an Incentive Spirometer to help you take deep breaths. When coughing or  sneezing, hold a pillow firmly against your incision with both hands. This is called splinting. Doing this helps protect your incision. It also decreases belly discomfort.  If you are being admitted to the hospital overnight, leave your suitcase in the car. After surgery it may be brought to your room.  In case of increased patient census, it may be necessary for you, the patient, to continue your postoperative care in  the Same Day Surgery department.  If you are being discharged the day of surgery, you will not be allowed to drive home. You will need a responsible individual to drive you home and stay with you for 24 hours after surgery.   If you are taking public transportation, you will need to have a responsible individual with you.  Please call the Pre-admissions Testing Dept. at [phone] if you have any questions about these instructions.  Surgery Visitation Policy:  Patients having surgery or a procedure may have two visitors.  Children under the age of 66 must have an adult with them who is not the patient.  Inpatient Visitation:    Visiting hours are 7 a.m. to 8 p.m. Up to four visitors are allowed at one time in a patient room. The visitors may rotate out with other people during the day.  One visitor age 68 or older may stay with the patient overnight and must be in the room by 8 p.m.    Pre-operative 4 CHG Bath Instructions   You can play a key role in reducing the risk of infection after surgery. Your skin needs to be as free of germs as possible. You can reduce the number of germs on your skin by washing with CHG (chlorhexidine  gluconate) soap before surgery. CHG is an antiseptic soap that kills germs and continues to kill germs even after washing.   DO NOT use if you have an allergy to chlorhexidine /CHG or antibacterial soaps. If your skin becomes reddened or irritated, stop using the CHG and notify one of our RNs at [phone].   Please shower with the CHG soap starting 4 days before surgery using the following schedule:     Please keep in mind the following:  DO NOT shave, including legs and underarms, starting the day of your first shower.   You may shave your face at any point before/day of surgery.  Place clean sheets on your bed the day you start using CHG soap. Use a clean washcloth (not used since being washed) for each shower. DO NOT sleep with pets once you  start using the CHG.   CHG Shower Instructions:  If you choose to wash your hair and private area, wash first with your normal shampoo/soap.  After you use shampoo/soap, rinse your hair and body thoroughly to remove shampoo/soap residue.  Turn the water OFF and apply about 3 tablespoons (45 ml) of CHG soap to a CLEAN washcloth.  Apply CHG soap ONLY FROM YOUR NECK DOWN TO YOUR TOES (washing for 3-5 minutes)  DO NOT use CHG soap on face, private areas, open wounds, or sores.  Pay special attention to the area where your surgery is being performed.  If you are having back surgery, having someone wash your back for you may be helpful. Wait 2 minutes after CHG soap is applied, then you may rinse off the CHG soap.  Pat dry with a clean towel  Put on clean clothes/pajamas   If you choose to wear lotion, please use ONLY the CHG-compatible lotions on  the back of this paper.     Additional instructions for the day of surgery: DO NOT APPLY any lotions, deodorants, cologne, or perfumes.   Put on clean/comfortable clothes.  Brush your teeth.  Ask your nurse before applying any prescription medications to the skin.      CHG Compatible Lotions   Aveeno Moisturizing lotion  Cetaphil Moisturizing Cream  Cetaphil Moisturizing Lotion  Clairol Herbal Essence Moisturizing Lotion, Dry Skin  Clairol Herbal Essence Moisturizing Lotion, Extra Dry Skin  Clairol Herbal Essence Moisturizing Lotion, Normal Skin  Curel Age Defying Therapeutic Moisturizing Lotion with Alpha Hydroxy  Curel Extreme Care Body Lotion  Curel Soothing Hands Moisturizing Hand Lotion  Curel Therapeutic Moisturizing Cream, Fragrance-Free  Curel Therapeutic Moisturizing Lotion, Fragrance-Free  Curel Therapeutic Moisturizing Lotion, Original Formula  Eucerin Daily Replenishing Lotion  Eucerin Dry Skin Therapy Plus Alpha Hydroxy Crme  Eucerin Dry Skin Therapy Plus Alpha Hydroxy Lotion  Eucerin Original Crme  Eucerin Original  Lotion  Eucerin Plus Crme Eucerin Plus Lotion  Eucerin TriLipid Replenishing Lotion  Keri Anti-Bacterial Hand Lotion  Keri Deep Conditioning Original Lotion Dry Skin Formula Softly Scented  Keri Deep Conditioning Original Lotion, Fragrance Free Sensitive Skin Formula  Keri Lotion Fast Absorbing Fragrance Free Sensitive Skin Formula  Keri Lotion Fast Absorbing Softly Scented Dry Skin Formula  Keri Original Lotion  Keri Skin Renewal Lotion Keri Silky Smooth Lotion  Keri Silky Smooth Sensitive Skin Lotion  Nivea Body Creamy Conditioning Oil  Nivea Body Extra Enriched Lotion  Nivea Body Original Lotion  Nivea Body Sheer Moisturizing Lotion Nivea Crme  Nivea Skin Firming Lotion  NutraDerm 30 Skin Lotion  NutraDerm Skin Lotion  NutraDerm Therapeutic Skin Cream  NutraDerm Therapeutic Skin Lotion  ProShield Protective Hand Cream  Provon moisturizing lotion   How to Use an Incentive Spirometer An incentive spirometer is a tool that measures how well you are filling your lungs with each breath. Learning to take long, deep breaths using this tool can help you keep your lungs clear and active. This may help to reverse or lessen your chance of developing breathing (pulmonary) problems, especially infection. You may be asked to use a spirometer: After a surgery. If you have a lung problem or a history of smoking. After a long period of time when you have been unable to move or be active. If the spirometer includes an indicator to show the highest number that you have reached, your health care provider or respiratory therapist will help you set a goal. Keep a log of your progress as told by your health care provider. What are the risks? Breathing too quickly may cause dizziness or cause you to pass out. Take your time so you do not get dizzy or light-headed. If you are in pain, you may need to take pain medicine before doing incentive spirometry. It is harder to take a deep breath if you are  having pain. How to use your incentive spirometer  Sit up on the edge of your bed or on a chair. Hold the incentive spirometer so that it is in an upright position. Before you use the spirometer, breathe out normally. Place the mouthpiece in your mouth. Make sure your lips are closed tightly around it. Breathe in slowly and as deeply as you can through your mouth, causing the piston or the ball to rise toward the top of the chamber. Hold your breath for 3-5 seconds, or for as long as possible. If the spirometer includes a coach indicator, use  this to guide you in breathing. Slow down your breathing if the indicator goes above the marked areas. Remove the mouthpiece from your mouth and breathe out normally. The piston or ball will return to the bottom of the chamber. Rest for a few seconds, then repeat the steps 10 or more times. Take your time and take a few normal breaths between deep breaths so that you do not get dizzy or light-headed. Do this every 1-2 hours when you are awake. If the spirometer includes a goal marker to show the highest number you have reached (best effort), use this as a goal to work toward during each repetition. After each set of 10 deep breaths, cough a few times. This will help to make sure that your lungs are clear. If you have an incision on your chest or abdomen from surgery, place a pillow or a rolled-up towel firmly against the incision when you cough. This can help to reduce pain while taking deep breaths and coughing. General tips When you are able to get out of bed: Walk around often. Continue to take deep breaths and cough in order to clear your lungs. Keep using the incentive spirometer until your health care provider says it is okay to stop using it. If you have been in the hospital, you may be told to keep using the spirometer at home. Contact a health care provider if: You are having difficulty using the spirometer. You have trouble using the spirometer  as often as instructed. Your pain medicine is not giving enough relief for you to use the spirometer as told. You have a fever. Get help right away if: You develop shortness of breath. You develop a cough with bloody mucus from the lungs. You have fluid or blood coming from an incision site after you cough. Summary An incentive spirometer is a tool that can help you learn to take long, deep breaths to keep your lungs clear and active. You may be asked to use a spirometer after a surgery, if you have a lung problem or a history of smoking, or if you have been inactive for a long period of time. Use your incentive spirometer as instructed every 1-2 hours while you are awake. If you have an incision on your chest or abdomen, place a pillow or a rolled-up towel firmly against your incision when you cough. This will help to reduce pain. Get help right away if you have shortness of breath, you cough up bloody mucus, or blood comes from your incision when you cough. This information is not intended to replace advice given to you by your health care provider. Make sure you discuss any questions you have with your health care provider. Document Revised: 06/14/2023 Document Reviewed: 06/14/2023 Elsevier Patient Education  2024 [employer].  [employer] Directory to address health-related social needs:  [email or web address]

## 2024-09-10 ENCOUNTER — Telehealth: Payer: Self-pay

## 2024-09-10 NOTE — Telephone Encounter (Signed)
 Called pt to advise of appt r/s for Monday 2/16 @ 9:30 with Sam. She said she needed to call me back

## 2024-09-11 ENCOUNTER — Other Ambulatory Visit: Payer: Self-pay

## 2024-09-14 ENCOUNTER — Other Ambulatory Visit: Payer: Self-pay

## 2024-09-14 ENCOUNTER — Inpatient Hospital Stay: Admission: RE | Admit: 2024-09-14 | Source: Ambulatory Visit

## 2024-09-15 ENCOUNTER — Other Ambulatory Visit: Payer: Self-pay

## 2024-09-15 ENCOUNTER — Other Ambulatory Visit: Payer: Self-pay | Admitting: Pharmacy Technician

## 2024-09-15 NOTE — Progress Notes (Signed)
 Specialty Pharmacy Refill Coordination Note  Krystale Rinkenberger is a 54 y.o. female contacted today regarding refills of specialty medication(s) Dupilumab  (Dupixent )   Patient requested Delivery   Delivery date: 09/24/24   Verified address: [address]  [place]   Medication will be filled on: 09/23/24

## 2024-09-16 ENCOUNTER — Other Ambulatory Visit: Payer: Self-pay

## 2024-09-16 MED ORDER — METHOCARBAMOL 750 MG PO TABS
750.0000 mg | ORAL_TABLET | Freq: Three times a day (TID) | ORAL | 0 refills | Status: AC
  Filled 2024-09-16: qty 90, 30d supply, fill #0

## 2024-09-16 MED ORDER — OXYCODONE HCL 15 MG PO TABS
15.0000 mg | ORAL_TABLET | Freq: Four times a day (QID) | ORAL | 0 refills | Status: AC
  Filled 2024-09-24: qty 120, 30d supply, fill #0

## 2024-09-16 MED ORDER — GABAPENTIN 600 MG PO TABS
600.0000 mg | ORAL_TABLET | Freq: Three times a day (TID) | ORAL | 0 refills | Status: AC
  Filled 2024-09-16: qty 90, 30d supply, fill #0

## 2024-09-16 MED ORDER — MORPHINE SULFATE ER 30 MG PO TBCR
30.0000 mg | EXTENDED_RELEASE_TABLET | Freq: Every day | ORAL | 0 refills | Status: AC
  Filled 2024-09-16: qty 30, 30d supply, fill #0

## 2024-09-16 MED ORDER — NALOXONE HCL 4 MG/0.1ML NA LIQD
1.0000 | NASAL | 0 refills | Status: AC | PRN
  Filled 2024-09-16: qty 2, 30d supply, fill #0

## 2024-09-17 ENCOUNTER — Ambulatory Visit (INDEPENDENT_AMBULATORY_CARE_PROVIDER_SITE_OTHER)

## 2024-09-17 VITALS — BP 108/64 | HR 101 | Temp 98.2°F | Ht 67.0 in | Wt 164.8 lb

## 2024-09-17 DIAGNOSIS — M1612 Unilateral primary osteoarthritis, left hip: Secondary | ICD-10-CM

## 2024-09-17 NOTE — Progress Notes (Signed)
 "  Office Visit Note   Patient: Jamie [name]           Date of Birth: March 13, 1971           MRN: [id number] Visit Date: 09/17/2024              PCP: Vicci Duwaine SQUIBB, DO   Assessment & Plan: Visit Diagnoses:  1. Arthritis of left hip     Plan: Natural history and expected course discussed. Questions answered. Will proceed with left total hip arthroplasty as planned.  Orders:  Orders Placed This Encounter  Procedures   Ambulatory referral to Physical Therapy     Subjective: Chief Complaint: Left hip pain  HPI Patient is a 54 y.o. year old female who presents for a follow up appointment for left hip pain. Patient's symptoms are unchanged since last visit. Current symptoms include groin pain and trouble weight bearing. Patient presents for her preoperative appointment.  Objective: Vital Signs: BP 108/64   Pulse (!) 101   Temp 98.2 F (36.8 C)   Ht 5' 7 (1.702 m)   Wt 74.8 kg   LMP 06/20/2016   SpO2 99%   BMI 25.81 kg/m   Physical Exam Gen: Alert, No Acute Distress left hip: Skin intact, no erythema or induration noted. groin tenderness to palpation. Limited range of motion. positive log roll. 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   Recurrent major depression-severe (HCC) 10/19/2020   GAD (generalized anxiety disorder) 04/26/2020   Panic attack 04/26/2020   At risk for long QT syndrome 04/26/2020   DDD (degenerative disc disease), lumbar 05/12/2019   Allergic rhinitis 05/11/2019   Primary osteoarthritis of right knee 03/20/2018   Insomnia 08/24/2017   Chronic back pain 10/16/2016   Pseudoarthrosis of lumbar spine 08/11/2015   Eczema 05/09/2015   Spinal stenosis of lumbar region  09/01/2014   Spondylolisthesis 08/03/2014   Displacement of lumbar intervertebral disc without myelopathy 08/03/2014   Past Medical History:  Diagnosis Date   Allergic rhinitis    Allergy 1980   Anxiety    takes Ativan  daily.  Panic Attack   Aortic atherosclerosis    Arthritis    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, unspecified (HCC)    At risk for long QT syndrome    Chronic back pain    herniated disc/stenosis/scoliosis   DDD (degenerative disc disease), lumbar    Depression    Displacement of lumbar intervertebral disc without myelopathy    Eczema    uses a cream daily as needed   Family history of adverse reaction to anesthesia    pta dad is very hard to wake up;excessive nausea   GAD (generalized anxiety disorder)    History of bronchitis    > 67yr ago   Hx of dysplastic nevus 05/10/2020   neck - posterior, Severe Atypia. Shave removal 07/18/2020, margins free   Hypoglycemia    Insomnia    Joint pain    Osteoarthritis of left hip    Panic attacks    PONV (postoperative nausea and vomiting)    Primary osteoarthritis of right knee    Pseudoarthrosis of lumbar spine    Recurrent major depression-severe (HCC)    S/P lumbar fusion    Seasonal allergies    uses  Flonase  daily   Spinal stenosis of lumbar region    Spondylolisthesis    Torn rotator cuff    Vitamin D  deficiency    takes Vit D daily   Wears contact lenses     Family History  Problem Relation Age of Onset   Arthritis Mother    Hyperlipidemia Mother    Migraines Mother    Arthritis Father    Diabetes Father    Heart disease Father    Atrial fibrillation Father    Diabetes Brother    Breast cancer Maternal Grandmother    Mental illness Neg Hx     Past Surgical History:  Procedure Laterality Date   APPLICATION OF INTRAOPERATIVE CT SCAN N/A 03/11/2023   Procedure: APPLICATION OF INTRAOPERATIVE CT SCAN;  Surgeon: Clois Fret, MD;  Location: [facility];  Service: Neurosurgery;  Laterality: N/A;   BACK  SURGERY  2008/2012, x2 in 2016   laminectomy 1st time and 2nd time laminectomy and fusion   COLONOSCOPY WITH PROPOFOL  N/A 05/27/2020   Procedure: COLONOSCOPY WITH PROPOFOL ;  Surgeon: Janalyn Keene NOVAK, MD;  Location: [facility];  Service: Gastroenterology;  Laterality: N/A;  COVID POSITIVE AUGUST 16   MAXIMUM ACCESS (MAS)POSTERIOR LUMBAR INTERBODY FUSION (PLIF) 2 LEVEL N/A 09/01/2014   Procedure: Lumbar four-five, Lumbar five-Sacral one Maximum Access Surgery posterior lumbar interbody fusion with interbody prosthesis posterior lateral arthrodesis posterior segmental instrumentation;  Surgeon: Arley SHAUNNA Helling, MD;  Location: [facility];  Service: Neurosurgery;  Laterality: N/A;  Lumbar four-five, Lumbar five-Sacral one Maximum Access Surgery posterior lumbar interbody fusion with i   SHOULDER ARTHROSCOPY WITH SUBACROMIAL DECOMPRESSION AND OPEN ROTATOR C Right 08/18/2021   Procedure: Right shoulder arthroscopic rotator cuff repair, biceps tenodesis, subacromial decompression;  Surgeon: Tobie Priest, MD;  Location: [facility];  Service: Orthopedics;  Laterality: Right;   SPINE SURGERY  X 5   Last 03/11/23   Social History   Occupational History   Not on file  Tobacco Use   Smoking status: Some Days    Current packs/day: 0.00    Average packs/day: 0.5 packs/day for 20.0 years (10.0 ttl pk-yrs)    Types: Cigarettes    Start date: 10/18/1996    Last attempt to quit: 10/18/2016    Years since quitting: 7.9   Smokeless tobacco: Never   Tobacco comments:    Already quit  Vaping Use   Vaping status: Never Used  Substance and Sexual Activity   Alcohol use: No   Drug use: No   Sexual activity: Not Currently    Birth control/protection: None   Current Outpatient Medications  Medication Instructions   Caplyta  21 mg, Oral, Daily   clobetasol  cream (TEMOVATE ) 0.05 % Apply 1 Application topically as directed once daily to affected areas of  hands and feet as needed for flares, avoid  face, groin, axilla.   Doxepin  HCl 3-6 mg, Oral, At bedtime PRN   Dupixent  300 mg, Subcutaneous, Every 14 days   fluticasone  (FLONASE ) 50 MCG/ACT nasal spray 1 spray, Each Nare, Daily PRN   gabapentin  (NEURONTIN ) 600 mg, Oral, 3 times daily   Halobetasol  Prop-Tazarotene (DUOBRII ) 0.01-0.045 % LOTN 1 application , Apply externally, Daily at bedtime, qhs to aa rash on hands and feet until clear, then prn flares, avoid face, groin, axilla   methocarbamol  (ROBAXIN ) 750 mg, Oral, 3 times daily   methocarbamol  (ROBAXIN ) 750 mg, Oral, 3 times daily   montelukast  (SINGULAIR ) 10 mg, Oral, Daily at bedtime   morphine  (MS CONTIN ) 30  mg, Oral, Daily   morphine  (MS CONTIN ) 30 mg, Oral, Daily   naloxone  (NARCAN ) nasal spray 4 mg/0.1 mL Place 1 spray into 1 nostril every 2 minutes (alternating nostrils with each dose) as needed for opioid overdose until help arrives.   naloxone  (NARCAN ) nasal spray 4 mg/0.1 mL Use 1 spray into one nostril every 2 minutes as needed for opioid overdose; alternate nostrils with each dose until help arrives.   oxyCODONE  (ROXICODONE ) 15 mg, Oral, Every 6 hours   rizatriptan  (MAXALT ) 10 mg, Oral, As needed, May repeat in 2 hours if needed   rosuvastatin  (CRESTOR ) 10 mg, Oral, Daily   tacrolimus  (PROTOPIC ) 0.1 % ointment Apply topically daily at bedtime to hands and feet   Tapinarof  (VTAMA ) 1 % CREA Apply once daily to aa hands, feet   Tavaborole  5 % SOLN Apply qhs to affected toenails   temazepam  (RESTORIL ) 15 mg, Oral, At bedtime PRN   Allergies as of 09/17/2024 - Review Complete 09/09/2024  Allergen Reaction Noted   Amoxicillin  Diarrhea, Nausea And Vomiting, and Shortness Of Breath 08/23/2021   Shellfish allergy Hives 08/18/2014   Flexeril  [cyclobenzaprine ] Hives 08/18/2014   Wound dressing adhesive Hives and Itching 03/11/2023   "

## 2024-09-18 ENCOUNTER — Ambulatory Visit: Admitting: Family Medicine

## 2024-09-18 ENCOUNTER — Encounter: Admitting: Neurology

## 2024-09-18 ENCOUNTER — Encounter: Payer: Self-pay | Admitting: Family Medicine

## 2024-09-18 VITALS — BP 114/77 | HR 75 | Temp 98.0°F | Resp 17 | Ht 67.01 in | Wt 164.4 lb

## 2024-09-18 DIAGNOSIS — R112 Nausea with vomiting, unspecified: Secondary | ICD-10-CM | POA: Diagnosis not present

## 2024-09-18 DIAGNOSIS — Z9889 Other specified postprocedural states: Secondary | ICD-10-CM

## 2024-09-18 DIAGNOSIS — Z01818 Encounter for other preprocedural examination: Secondary | ICD-10-CM

## 2024-09-18 LAB — COAGUCHEK XS/INR WAIVED
INR: 1 (ref 0.9–1.1)
Prothrombin Time: 11.9 s

## 2024-09-18 NOTE — Progress Notes (Signed)
 "  BP 114/77 (BP Location: Left Arm, Patient Position: Sitting, Cuff Size: Normal)   Pulse 75   Temp 98 F (36.7 C) (Oral)   Resp 17   Ht 5' 7.01 (1.702 m)   Wt 164 lb 6.4 oz (74.6 kg)   LMP 06/20/2016   SpO2 97%   BMI 25.74 kg/m    Subjective:    Patient ID: Jamie [name], female    DOB: [birth year], 54 y.o.   MRN: [id number]  HPI: Jamie [name] is a 54 y.o. female  Chief Complaint  Patient presents with   Pre-op Exam    Here for surgery clearance    Jamie [name] presents today for medical surgical clearance for a total L hip arthroplasty scheduled for 09/21/24. She notes that besides her hip hurting, she has generally been feeling well. She has had many surgeries in the past- her most recent was her back surgery in 2024. She has never had any problems with anesthesia. She has always woken up easily, has never had any problem with extubation and has always gone home when she was supposed to. She does have a little post-op nausea- but has never vomited. She does note that her father has issues with being woken up with anesthesia. No other family history of problems with anesthesia. She has generally been feeling well. She is [name] to walk 3 blocks or up 3 flights of stairs without CP or SOB. No dizziness, no loose teeth. She has no other concerns or complaints at this time.     Active Ambulatory Problems    Diagnosis Date Noted   Spinal stenosis of lumbar region 09/01/2014   Eczema 05/09/2015   Pseudoarthrosis of lumbar spine 08/11/2015   Chronic back pain 10/16/2016   Insomnia 08/24/2017   Allergic rhinitis 05/11/2019   Primary osteoarthritis of right knee 03/20/2018   GAD (generalized anxiety disorder) 04/26/2020   Panic attack 04/26/2020   At risk for long QT syndrome 04/26/2020   Recurrent major depression-severe (HCC) 10/19/2020   Spondylolisthesis 08/03/2014   Displacement of lumbar intervertebral disc without myelopathy 08/03/2014   DDD (degenerative disc disease), lumbar  05/12/2019   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, 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   S/P lumbar fusion 03/11/2023   Generalized headaches 02/19/2024   Mixed hyperlipidemia 08/21/2024   Resolved Ambulatory Problems    Diagnosis Date Noted   Depression, recurrent 03/08/2015   Anxiety 05/09/2015   COVID-19 04/14/2020   MDD (major depressive disorder), recurrent episode, moderate (HCC) 04/26/2020   High risk medication use 04/26/2020   Encounter for screening colonoscopy    Left wrist pain 10/25/2020   Elevated blood-pressure reading, without diagnosis of hypertension 10/26/2019   Chronic low back pain 01/05/2015   Cough 08/23/2021   Generalized body aches 06/22/2022   Past Medical History:  Diagnosis Date   Allergy 1980   Aortic atherosclerosis    Arthritis    Depression    Family history of adverse reaction to anesthesia    History of bronchitis    Hx of dysplastic nevus 05/10/2020   Hypoglycemia    Joint pain    Osteoarthritis of left hip    Panic attacks    PONV (postoperative nausea and vomiting)    Seasonal allergies    Torn rotator cuff    Vitamin D  deficiency    Wears contact lenses    Past Surgical History:  Procedure Laterality Date   APPLICATION OF INTRAOPERATIVE CT SCAN N/A 03/11/2023   Procedure: APPLICATION  OF INTRAOPERATIVE CT SCAN;  Surgeon: Clois Fret, MD;  Location: [facility];  Service: Neurosurgery;  Laterality: N/A;   BACK SURGERY  2008/2012, x2 in 2016   laminectomy 1st time and 2nd time laminectomy and fusion   COLONOSCOPY WITH PROPOFOL  N/A 05/27/2020   Procedure: COLONOSCOPY WITH PROPOFOL ;  Surgeon: Janalyn Keene NOVAK, MD;  Location: [facility];  Service: Gastroenterology;  Laterality: N/A;  COVID POSITIVE AUGUST 16   MAXIMUM ACCESS (MAS)POSTERIOR LUMBAR INTERBODY FUSION (PLIF) 2 LEVEL N/A 09/01/2014   Procedure: Lumbar four-five, Lumbar five-Sacral one Maximum Access Surgery posterior lumbar interbody  fusion with interbody prosthesis posterior lateral arthrodesis posterior segmental instrumentation;  Surgeon: Arley SHAUNNA Helling, MD;  Location: [facility];  Service: Neurosurgery;  Laterality: N/A;  Lumbar four-five, Lumbar five-Sacral one Maximum Access Surgery posterior lumbar interbody fusion with i   SHOULDER ARTHROSCOPY WITH SUBACROMIAL DECOMPRESSION AND OPEN ROTATOR C Right 08/18/2021   Procedure: Right shoulder arthroscopic rotator cuff repair, biceps tenodesis, subacromial decompression;  Surgeon: Tobie Priest, MD;  Location: [facility];  Service: Orthopedics;  Laterality: Right;   SPINE SURGERY  X 5   Last 03/11/23   Outpatient Encounter Medications as of 09/18/2024  Medication Sig Note   clobetasol  cream (TEMOVATE ) 0.05 % Apply 1 Application topically as directed once daily to affected areas of  hands and feet as needed for flares, avoid face, groin, axilla.    Doxepin  HCl 3 MG TABS Take 1-2 tablets (3-6 mg total) by mouth at bedtime as needed.    Dupilumab  (DUPIXENT ) 300 MG/2ML SOAJ Inject 300 mg into the skin every 14 (fourteen) days.    fluticasone  (FLONASE ) 50 MCG/ACT nasal spray Place 1 spray into both nostrils daily as needed for allergies.    gabapentin  (NEURONTIN ) 600 MG tablet Take 1 tablet (600 mg total) by mouth 3 (three) times daily.    Halobetasol  Prop-Tazarotene (DUOBRII ) 0.01-0.045 % LOTN Apply 1 application topically at bedtime. qhs to aa rash on hands and feet until clear, then prn flares, avoid face, groin, axilla    Lumateperone  Tosylate (CAPLYTA ) 21 MG CAPS Take 21 mg by mouth daily. 09/07/2024: Have not started    methocarbamol  (ROBAXIN ) 750 MG tablet Take 1 tablet (750 mg total) by mouth 3 (three) times daily.    methocarbamol  (ROBAXIN ) 750 MG tablet Take 1 tablet (750 mg total) by mouth 3 (three) times daily.    montelukast  (SINGULAIR ) 10 MG tablet Take 1 tablet (10 mg total) by mouth at bedtime.    morphine  (MS CONTIN ) 30 MG 12 hr tablet Take 1 tablet (30 mg  total) by mouth daily. (Patient taking differently: Take 30 mg by mouth every morning.)    morphine  (MS CONTIN ) 30 MG 12 hr tablet Take 1 tablet (30 mg total) by mouth daily.    naloxone  (NARCAN ) nasal spray 4 mg/0.1 mL Place 1 spray into 1 nostril every 2 minutes (alternating nostrils with each dose) as needed for opioid overdose until help arrives.    naloxone  (NARCAN ) nasal spray 4 mg/0.1 mL Use 1 spray into one nostril every 2 minutes as needed for opioid overdose; alternate nostrils with each dose until help arrives.    oxyCODONE  (ROXICODONE ) 15 MG immediate release tablet Take 1 tablet (15 mg total) by mouth every 6 (six) hours.    rizatriptan  (MAXALT ) 10 MG tablet Take 1 tablet (10 mg total) by mouth as needed for migraine. May repeat in 2 hours if needed    rosuvastatin  (CRESTOR ) 10 MG tablet Take 1  tablet (10 mg total) by mouth daily.    tacrolimus  (PROTOPIC ) 0.1 % ointment Apply topically daily at bedtime to hands and feet    Tapinarof  (VTAMA ) 1 % CREA Apply once daily to aa hands, feet    Tavaborole  5 % SOLN Apply qhs to affected toenails    temazepam  (RESTORIL ) 15 MG capsule Take 1 capsule (15 mg total) by mouth at bedtime as needed for sleep.    No facility-administered encounter medications on file as of 09/18/2024.   Allergies[1] Social History   Socioeconomic History   Marital status: Divorced    Spouse name: Not on file   Number of children: Not on file   Years of education: Not on file   Highest education level: Bachelor's degree (e.g., BA, AB, BS)  Occupational History   Not on file  Tobacco Use   Smoking status: Some Days    Current packs/day: 0.00    Average packs/day: 0.5 packs/day for 20.0 years (10.0 ttl pk-yrs)    Types: Cigarettes    Start date: 10/18/1996    Last attempt to quit: 10/18/2016    Years since quitting: 7.9   Smokeless tobacco: Never   Tobacco comments:    Already quit  Vaping Use   Vaping status: Never Used  Substance and Sexual Activity    Alcohol use: No   Drug use: No   Sexual activity: Not Currently    Birth control/protection: None  Other Topics Concern   Not on file  Social History Narrative   Not on file   Social Drivers of Health   Tobacco Use: High Risk (09/18/2024)   Patient History    Smoking Tobacco Use: Some Days    Smokeless Tobacco Use: Never    Passive Exposure: Not on file  Financial Resource Strain: Patient Declined (07/07/2024)   Overall Financial Resource Strain (CARDIA)    Difficulty of Paying Living Expenses: Patient declined  Food Insecurity: No Food Insecurity (07/07/2024)   Epic    Worried About Programme Researcher, Broadcasting/film/video in the Last Year: Never true    Ran Out of Food in the Last Year: Never true  Transportation Needs: No Transportation Needs (07/07/2024)   Epic    Lack of Transportation (Medical): No    Lack of Transportation (Non-Medical): No  Physical Activity: Insufficiently Active (07/07/2024)   Exercise Vital Sign    Days of Exercise per Week: 3 days    Minutes of Exercise per Session: 20 min  Stress: Stress Concern Present (07/07/2024)   [employer] of Occupational Health - Occupational Stress Questionnaire    Feeling of Stress: Very much  Social Connections: Moderately Isolated (07/07/2024)   Social Connection and Isolation Panel    Frequency of Communication with Friends and Family: Once a week    Frequency of Social Gatherings with Friends and Family: Once a week    Attends Religious Services: More than 4 times per year    Active Member of Clubs or Organizations: Yes    Attends Banker Meetings: More than 4 times per year    Marital Status: Divorced  Depression (PHQ2-9): High Risk (08/21/2024)   Depression (PHQ2-9)    PHQ-2 Score: 24  Alcohol Screen: Low Risk (04/15/2024)   Alcohol Screen    Last Alcohol Screening Score (AUDIT): 0  Housing: Low Risk (07/07/2024)   Epic    Unable to Pay for Housing in the Last Year: No    Number of Times Moved in the Last  Year: 0  Homeless in the Last Year: No  Utilities: Not At Risk (11/11/2023)   Received from [facility]    Threatened with loss of utilities: No  Health Literacy: Adequate Health Literacy (04/15/2024)   B1300 Health Literacy    Frequency of need for help with medical instructions: Never   Family History  Problem Relation Age of Onset   Arthritis Mother    Hyperlipidemia Mother    Migraines Mother    Arthritis Father    Diabetes Father    Heart disease Father    Atrial fibrillation Father    Diabetes Brother    Breast cancer Maternal Grandmother    Mental illness Neg Hx      Review of Systems  Constitutional: Negative.   HENT: Negative.    Respiratory: Negative.    Cardiovascular: Negative.   Musculoskeletal:  Positive for arthralgias. Negative for back pain, gait problem, joint swelling, myalgias, neck pain and neck stiffness.  Skin: Negative.   Neurological: Negative.   Psychiatric/Behavioral: Negative.      Per HPI unless specifically indicated above     Objective:    BP 114/77 (BP Location: Left Arm, Patient Position: Sitting, Cuff Size: Normal)   Pulse 75   Temp 98 F (36.7 C) (Oral)   Resp 17   Ht 5' 7.01 (1.702 m)   Wt 164 lb 6.4 oz (74.6 kg)   LMP 06/20/2016   SpO2 97%   BMI 25.74 kg/m   Wt Readings from Last 3 Encounters:  09/18/24 164 lb 6.4 oz (74.6 kg)  09/17/24 164 lb 12.8 oz (74.8 kg)  09/09/24 166 lb (75.3 kg)    Physical Exam Vitals and nursing note reviewed.  Constitutional:      General: She is not in acute distress.    Appearance: Normal appearance. She is not ill-appearing, toxic-appearing or diaphoretic.  HENT:     Head: Normocephalic and atraumatic.     Right Ear: External ear normal.     Left Ear: External ear normal.     Nose: Nose normal.     Mouth/Throat:     Mouth: Mucous membranes are moist.     Pharynx: Oropharynx is clear.     Comments: +1 tonsils, slightly erythematous Eyes:      General: No scleral icterus.       Right eye: No discharge.        Left eye: No discharge.     Extraocular Movements: Extraocular movements intact.     Conjunctiva/sclera: Conjunctivae normal.     Pupils: Pupils are equal, round, and reactive to light.  Cardiovascular:     Rate and Rhythm: Normal rate and regular rhythm.     Pulses: Normal pulses.     Heart sounds: Normal heart sounds. No murmur heard.    No friction rub. No gallop.  Pulmonary:     Effort: Pulmonary effort is normal. No respiratory distress.     Breath sounds: Normal breath sounds. No stridor. No wheezing, rhonchi or rales.  Chest:     Chest wall: No tenderness.  Musculoskeletal:        General: Normal range of motion.     Cervical back: Normal range of motion and neck supple.  Skin:    General: Skin is warm and dry.     Capillary Refill: Capillary refill takes less than 2 seconds.     Coloration: Skin is not jaundiced or pale.     Findings: No bruising, erythema, lesion  or rash.  Neurological:     General: No focal deficit present.     Mental Status: She is alert and oriented to person, place, and time. Mental status is at baseline.  Psychiatric:        Mood and Affect: Mood normal.        Behavior: Behavior normal.        Thought Content: Thought content normal.        Judgment: Judgment normal.     Results for orders placed or performed during the hospital encounter of 09/09/24  CBC   Collection Time: 09/09/24  2:10 PM  Result Value Ref Range   WBC 8.1 4.0 - 10.5 K/uL   RBC 4.24 3.87 - 5.11 MIL/uL   Hemoglobin 13.4 12.0 - 15.0 g/dL   HCT 60.3 63.9 - 53.9 %   MCV 93.4 80.0 - 100.0 fL   MCH 31.6 26.0 - 34.0 pg   MCHC 33.8 30.0 - 36.0 g/dL   RDW 87.2 88.4 - 84.4 %   Platelets 227 150 - 400 K/uL   nRBC 0.0 0.0 - 0.2 %  Basic metabolic panel with GFR   Collection Time: 09/09/24  2:10 PM  Result Value Ref Range   Sodium 139 135 - 145 mmol/L   Potassium 3.9 3.5 - 5.1 mmol/L   Chloride 105 98 - 111  mmol/L   CO2 24 22 - 32 mmol/L   Glucose, Bld 84 70 - 99 mg/dL   BUN 11 6 - 20 mg/dL   Creatinine, Ser 9.36 0.44 - 1.00 mg/dL   Calcium  9.4 8.9 - 10.3 mg/dL   GFR, Estimated >39 >39 mL/min   Anion gap 11 5 - 15  Urinalysis, Complete w Microscopic -Urine, Clean Catch   Collection Time: 09/09/24  2:10 PM  Result Value Ref Range   Color, Urine YELLOW (A) YELLOW   APPearance CLEAR (A) CLEAR   Specific Gravity, Urine 1.017 1.005 - 1.030   pH 5.0 5.0 - 8.0   Glucose, UA NEGATIVE NEGATIVE mg/dL   Hgb urine dipstick SMALL (A) NEGATIVE   Bilirubin Urine NEGATIVE NEGATIVE   Ketones, ur NEGATIVE NEGATIVE mg/dL   Protein, ur NEGATIVE NEGATIVE mg/dL   Nitrite NEGATIVE NEGATIVE   Leukocytes,Ua NEGATIVE NEGATIVE   RBC / HPF 0-5 0 - 5 RBC/hpf   WBC, UA 0-5 0 - 5 WBC/hpf   Bacteria, UA RARE (A) NONE SEEN   Squamous Epithelial / HPF 0-5 0 - 5 /HPF   Mucus PRESENT   Type and screen [facility]   Collection Time: 09/09/24  2:10 PM  Result Value Ref Range   ABO/RH(D) A POS    Antibody Screen NEG    Sample Expiration 09/23/2024,2359    Extend sample reason      NO TRANSFUSIONS OR PREGNANCY IN THE PAST 3 MONTHS Performed at [facility], [address] [place], [place] [address]   Surgical pcr screen   Collection Time: 09/09/24  2:18 PM   Specimen: Nasal Mucosa; Nasal Swab  Result Value Ref Range   MRSA, PCR NEGATIVE NEGATIVE   Staphylococcus aureus NEGATIVE NEGATIVE      Assessment & Plan:   Problem List Items Addressed This Visit   None Visit Diagnoses       Preop exam for internal medicine    -  Primary   Has gotten cardiac clearance. Labs normal for pre-op. INR normal today. She is cleared for surgery. Will send paperwork to her orthopedist.  Relevant Orders   CoaguChek XS/INR Waived     Postoperative nausea and vomiting       Will make sure to tell her anesthesiologist.        Follow up plan: Return if symptoms worsen or fail to  improve.         [1]  Allergies Allergen Reactions   Amoxicillin  Diarrhea, Nausea And Vomiting and Shortness Of Breath   Shellfish Allergy Hives   Flexeril  [Cyclobenzaprine ] Hives    RAPID HEARTBEAT   Wound Dressing Adhesive Hives and Itching   Lyrica [Pregabalin] Palpitations    Also extreme drowsiness    "

## 2024-09-18 NOTE — H&P (View-Only) (Signed)
 "  BP 114/77 (BP Location: Left Arm, Patient Position: Sitting, Cuff Size: Normal)   Pulse 75   Temp 98 F (36.7 C) (Oral)   Resp 17   Ht 5' 7.01 (1.702 m)   Wt 164 lb 6.4 oz (74.6 kg)   LMP 06/20/2016   SpO2 97%   BMI 25.74 kg/m    Subjective:    Patient ID: Jamie [name], female    DOB: [birth year], 54 y.o.   MRN: [id number]  HPI: Jamie [name] is a 54 y.o. female  Chief Complaint  Patient presents with   Pre-op Exam    Here for surgery clearance    Jamie [name] presents today for medical surgical clearance for a total L hip arthroplasty scheduled for 09/21/24. She notes that besides her hip hurting, she has generally been feeling well. She has had many surgeries in the past- her most recent was her back surgery in 2024. She has never had any problems with anesthesia. She has always woken up easily, has never had any problem with extubation and has always gone home when she was supposed to. She does have a little post-op nausea- but has never vomited. She does note that her father has issues with being woken up with anesthesia. No other family history of problems with anesthesia. She has generally been feeling well. She is [name] to walk 3 blocks or up 3 flights of stairs without CP or SOB. No dizziness, no loose teeth. She has no other concerns or complaints at this time.     Active Ambulatory Problems    Diagnosis Date Noted   Spinal stenosis of lumbar region 09/01/2014   Eczema 05/09/2015   Pseudoarthrosis of lumbar spine 08/11/2015   Chronic back pain 10/16/2016   Insomnia 08/24/2017   Allergic rhinitis 05/11/2019   Primary osteoarthritis of right knee 03/20/2018   GAD (generalized anxiety disorder) 04/26/2020   Panic attack 04/26/2020   At risk for long QT syndrome 04/26/2020   Recurrent major depression-severe (HCC) 10/19/2020   Spondylolisthesis 08/03/2014   Displacement of lumbar intervertebral disc without myelopathy 08/03/2014   DDD (degenerative disc disease), lumbar  05/12/2019   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, 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   S/P lumbar fusion 03/11/2023   Generalized headaches 02/19/2024   Mixed hyperlipidemia 08/21/2024   Resolved Ambulatory Problems    Diagnosis Date Noted   Depression, recurrent 03/08/2015   Anxiety 05/09/2015   COVID-19 04/14/2020   MDD (major depressive disorder), recurrent episode, moderate (HCC) 04/26/2020   High risk medication use 04/26/2020   Encounter for screening colonoscopy    Left wrist pain 10/25/2020   Elevated blood-pressure reading, without diagnosis of hypertension 10/26/2019   Chronic low back pain 01/05/2015   Cough 08/23/2021   Generalized body aches 06/22/2022   Past Medical History:  Diagnosis Date   Allergy 1980   Aortic atherosclerosis    Arthritis    Depression    Family history of adverse reaction to anesthesia    History of bronchitis    Hx of dysplastic nevus 05/10/2020   Hypoglycemia    Joint pain    Osteoarthritis of left hip    Panic attacks    PONV (postoperative nausea and vomiting)    Seasonal allergies    Torn rotator cuff    Vitamin D  deficiency    Wears contact lenses    Past Surgical History:  Procedure Laterality Date   APPLICATION OF INTRAOPERATIVE CT SCAN N/A 03/11/2023   Procedure: APPLICATION  OF INTRAOPERATIVE CT SCAN;  Surgeon: Clois Fret, MD;  Location: [facility];  Service: Neurosurgery;  Laterality: N/A;   BACK SURGERY  2008/2012, x2 in 2016   laminectomy 1st time and 2nd time laminectomy and fusion   COLONOSCOPY WITH PROPOFOL  N/A 05/27/2020   Procedure: COLONOSCOPY WITH PROPOFOL ;  Surgeon: Janalyn Keene NOVAK, MD;  Location: [facility];  Service: Gastroenterology;  Laterality: N/A;  COVID POSITIVE AUGUST 16   MAXIMUM ACCESS (MAS)POSTERIOR LUMBAR INTERBODY FUSION (PLIF) 2 LEVEL N/A 09/01/2014   Procedure: Lumbar four-five, Lumbar five-Sacral one Maximum Access Surgery posterior lumbar interbody  fusion with interbody prosthesis posterior lateral arthrodesis posterior segmental instrumentation;  Surgeon: Arley SHAUNNA Helling, MD;  Location: [facility];  Service: Neurosurgery;  Laterality: N/A;  Lumbar four-five, Lumbar five-Sacral one Maximum Access Surgery posterior lumbar interbody fusion with i   SHOULDER ARTHROSCOPY WITH SUBACROMIAL DECOMPRESSION AND OPEN ROTATOR C Right 08/18/2021   Procedure: Right shoulder arthroscopic rotator cuff repair, biceps tenodesis, subacromial decompression;  Surgeon: Tobie Priest, MD;  Location: [facility];  Service: Orthopedics;  Laterality: Right;   SPINE SURGERY  X 5   Last 03/11/23   Outpatient Encounter Medications as of 09/18/2024  Medication Sig Note   clobetasol  cream (TEMOVATE ) 0.05 % Apply 1 Application topically as directed once daily to affected areas of  hands and feet as needed for flares, avoid face, groin, axilla.    Doxepin  HCl 3 MG TABS Take 1-2 tablets (3-6 mg total) by mouth at bedtime as needed.    Dupilumab  (DUPIXENT ) 300 MG/2ML SOAJ Inject 300 mg into the skin every 14 (fourteen) days.    fluticasone  (FLONASE ) 50 MCG/ACT nasal spray Place 1 spray into both nostrils daily as needed for allergies.    gabapentin  (NEURONTIN ) 600 MG tablet Take 1 tablet (600 mg total) by mouth 3 (three) times daily.    Halobetasol  Prop-Tazarotene (DUOBRII ) 0.01-0.045 % LOTN Apply 1 application topically at bedtime. qhs to aa rash on hands and feet until clear, then prn flares, avoid face, groin, axilla    Lumateperone  Tosylate (CAPLYTA ) 21 MG CAPS Take 21 mg by mouth daily. 09/07/2024: Have not started    methocarbamol  (ROBAXIN ) 750 MG tablet Take 1 tablet (750 mg total) by mouth 3 (three) times daily.    methocarbamol  (ROBAXIN ) 750 MG tablet Take 1 tablet (750 mg total) by mouth 3 (three) times daily.    montelukast  (SINGULAIR ) 10 MG tablet Take 1 tablet (10 mg total) by mouth at bedtime.    morphine  (MS CONTIN ) 30 MG 12 hr tablet Take 1 tablet (30 mg  total) by mouth daily. (Patient taking differently: Take 30 mg by mouth every morning.)    morphine  (MS CONTIN ) 30 MG 12 hr tablet Take 1 tablet (30 mg total) by mouth daily.    naloxone  (NARCAN ) nasal spray 4 mg/0.1 mL Place 1 spray into 1 nostril every 2 minutes (alternating nostrils with each dose) as needed for opioid overdose until help arrives.    naloxone  (NARCAN ) nasal spray 4 mg/0.1 mL Use 1 spray into one nostril every 2 minutes as needed for opioid overdose; alternate nostrils with each dose until help arrives.    oxyCODONE  (ROXICODONE ) 15 MG immediate release tablet Take 1 tablet (15 mg total) by mouth every 6 (six) hours.    rizatriptan  (MAXALT ) 10 MG tablet Take 1 tablet (10 mg total) by mouth as needed for migraine. May repeat in 2 hours if needed    rosuvastatin  (CRESTOR ) 10 MG tablet Take 1  tablet (10 mg total) by mouth daily.    tacrolimus  (PROTOPIC ) 0.1 % ointment Apply topically daily at bedtime to hands and feet    Tapinarof  (VTAMA ) 1 % CREA Apply once daily to aa hands, feet    Tavaborole  5 % SOLN Apply qhs to affected toenails    temazepam  (RESTORIL ) 15 MG capsule Take 1 capsule (15 mg total) by mouth at bedtime as needed for sleep.    No facility-administered encounter medications on file as of 09/18/2024.   Allergies[1] Social History   Socioeconomic History   Marital status: Divorced    Spouse name: Not on file   Number of children: Not on file   Years of education: Not on file   Highest education level: Bachelor's degree (e.g., BA, AB, BS)  Occupational History   Not on file  Tobacco Use   Smoking status: Some Days    Current packs/day: 0.00    Average packs/day: 0.5 packs/day for 20.0 years (10.0 ttl pk-yrs)    Types: Cigarettes    Start date: 10/18/1996    Last attempt to quit: 10/18/2016    Years since quitting: 7.9   Smokeless tobacco: Never   Tobacco comments:    Already quit  Vaping Use   Vaping status: Never Used  Substance and Sexual Activity    Alcohol use: No   Drug use: No   Sexual activity: Not Currently    Birth control/protection: None  Other Topics Concern   Not on file  Social History Narrative   Not on file   Social Drivers of Health   Tobacco Use: High Risk (09/18/2024)   Patient History    Smoking Tobacco Use: Some Days    Smokeless Tobacco Use: Never    Passive Exposure: Not on file  Financial Resource Strain: Patient Declined (07/07/2024)   Overall Financial Resource Strain (CARDIA)    Difficulty of Paying Living Expenses: Patient declined  Food Insecurity: No Food Insecurity (07/07/2024)   Epic    Worried About Programme Researcher, Broadcasting/film/video in the Last Year: Never true    Ran Out of Food in the Last Year: Never true  Transportation Needs: No Transportation Needs (07/07/2024)   Epic    Lack of Transportation (Medical): No    Lack of Transportation (Non-Medical): No  Physical Activity: Insufficiently Active (07/07/2024)   Exercise Vital Sign    Days of Exercise per Week: 3 days    Minutes of Exercise per Session: 20 min  Stress: Stress Concern Present (07/07/2024)   [employer] of Occupational Health - Occupational Stress Questionnaire    Feeling of Stress: Very much  Social Connections: Moderately Isolated (07/07/2024)   Social Connection and Isolation Panel    Frequency of Communication with Friends and Family: Once a week    Frequency of Social Gatherings with Friends and Family: Once a week    Attends Religious Services: More than 4 times per year    Active Member of Clubs or Organizations: Yes    Attends Banker Meetings: More than 4 times per year    Marital Status: Divorced  Depression (PHQ2-9): High Risk (08/21/2024)   Depression (PHQ2-9)    PHQ-2 Score: 24  Alcohol Screen: Low Risk (04/15/2024)   Alcohol Screen    Last Alcohol Screening Score (AUDIT): 0  Housing: Low Risk (07/07/2024)   Epic    Unable to Pay for Housing in the Last Year: No    Number of Times Moved in the Last  Year: 0  Homeless in the Last Year: No  Utilities: Not At Risk (11/11/2023)   Received from [facility]    Threatened with loss of utilities: No  Health Literacy: Adequate Health Literacy (04/15/2024)   B1300 Health Literacy    Frequency of need for help with medical instructions: Never   Family History  Problem Relation Age of Onset   Arthritis Mother    Hyperlipidemia Mother    Migraines Mother    Arthritis Father    Diabetes Father    Heart disease Father    Atrial fibrillation Father    Diabetes Brother    Breast cancer Maternal Grandmother    Mental illness Neg Hx      Review of Systems  Constitutional: Negative.   HENT: Negative.    Respiratory: Negative.    Cardiovascular: Negative.   Musculoskeletal:  Positive for arthralgias. Negative for back pain, gait problem, joint swelling, myalgias, neck pain and neck stiffness.  Skin: Negative.   Neurological: Negative.   Psychiatric/Behavioral: Negative.      Per HPI unless specifically indicated above     Objective:    BP 114/77 (BP Location: Left Arm, Patient Position: Sitting, Cuff Size: Normal)   Pulse 75   Temp 98 F (36.7 C) (Oral)   Resp 17   Ht 5' 7.01 (1.702 m)   Wt 164 lb 6.4 oz (74.6 kg)   LMP 06/20/2016   SpO2 97%   BMI 25.74 kg/m   Wt Readings from Last 3 Encounters:  09/18/24 164 lb 6.4 oz (74.6 kg)  09/17/24 164 lb 12.8 oz (74.8 kg)  09/09/24 166 lb (75.3 kg)    Physical Exam Vitals and nursing note reviewed.  Constitutional:      General: She is not in acute distress.    Appearance: Normal appearance. She is not ill-appearing, toxic-appearing or diaphoretic.  HENT:     Head: Normocephalic and atraumatic.     Right Ear: External ear normal.     Left Ear: External ear normal.     Nose: Nose normal.     Mouth/Throat:     Mouth: Mucous membranes are moist.     Pharynx: Oropharynx is clear.     Comments: +1 tonsils, slightly erythematous Eyes:      General: No scleral icterus.       Right eye: No discharge.        Left eye: No discharge.     Extraocular Movements: Extraocular movements intact.     Conjunctiva/sclera: Conjunctivae normal.     Pupils: Pupils are equal, round, and reactive to light.  Cardiovascular:     Rate and Rhythm: Normal rate and regular rhythm.     Pulses: Normal pulses.     Heart sounds: Normal heart sounds. No murmur heard.    No friction rub. No gallop.  Pulmonary:     Effort: Pulmonary effort is normal. No respiratory distress.     Breath sounds: Normal breath sounds. No stridor. No wheezing, rhonchi or rales.  Chest:     Chest wall: No tenderness.  Musculoskeletal:        General: Normal range of motion.     Cervical back: Normal range of motion and neck supple.  Skin:    General: Skin is warm and dry.     Capillary Refill: Capillary refill takes less than 2 seconds.     Coloration: Skin is not jaundiced or pale.     Findings: No bruising, erythema, lesion  or rash.  Neurological:     General: No focal deficit present.     Mental Status: She is alert and oriented to person, place, and time. Mental status is at baseline.  Psychiatric:        Mood and Affect: Mood normal.        Behavior: Behavior normal.        Thought Content: Thought content normal.        Judgment: Judgment normal.     Results for orders placed or performed during the hospital encounter of 09/09/24  CBC   Collection Time: 09/09/24  2:10 PM  Result Value Ref Range   WBC 8.1 4.0 - 10.5 K/uL   RBC 4.24 3.87 - 5.11 MIL/uL   Hemoglobin 13.4 12.0 - 15.0 g/dL   HCT 60.3 63.9 - 53.9 %   MCV 93.4 80.0 - 100.0 fL   MCH 31.6 26.0 - 34.0 pg   MCHC 33.8 30.0 - 36.0 g/dL   RDW 87.2 88.4 - 84.4 %   Platelets 227 150 - 400 K/uL   nRBC 0.0 0.0 - 0.2 %  Basic metabolic panel with GFR   Collection Time: 09/09/24  2:10 PM  Result Value Ref Range   Sodium 139 135 - 145 mmol/L   Potassium 3.9 3.5 - 5.1 mmol/L   Chloride 105 98 - 111  mmol/L   CO2 24 22 - 32 mmol/L   Glucose, Bld 84 70 - 99 mg/dL   BUN 11 6 - 20 mg/dL   Creatinine, Ser 9.36 0.44 - 1.00 mg/dL   Calcium  9.4 8.9 - 10.3 mg/dL   GFR, Estimated >39 >39 mL/min   Anion gap 11 5 - 15  Urinalysis, Complete w Microscopic -Urine, Clean Catch   Collection Time: 09/09/24  2:10 PM  Result Value Ref Range   Color, Urine YELLOW (A) YELLOW   APPearance CLEAR (A) CLEAR   Specific Gravity, Urine 1.017 1.005 - 1.030   pH 5.0 5.0 - 8.0   Glucose, UA NEGATIVE NEGATIVE mg/dL   Hgb urine dipstick SMALL (A) NEGATIVE   Bilirubin Urine NEGATIVE NEGATIVE   Ketones, ur NEGATIVE NEGATIVE mg/dL   Protein, ur NEGATIVE NEGATIVE mg/dL   Nitrite NEGATIVE NEGATIVE   Leukocytes,Ua NEGATIVE NEGATIVE   RBC / HPF 0-5 0 - 5 RBC/hpf   WBC, UA 0-5 0 - 5 WBC/hpf   Bacteria, UA RARE (A) NONE SEEN   Squamous Epithelial / HPF 0-5 0 - 5 /HPF   Mucus PRESENT   Type and screen [facility]   Collection Time: 09/09/24  2:10 PM  Result Value Ref Range   ABO/RH(D) A POS    Antibody Screen NEG    Sample Expiration 09/23/2024,2359    Extend sample reason      NO TRANSFUSIONS OR PREGNANCY IN THE PAST 3 MONTHS Performed at [facility], [address] [place], [place] [address]   Surgical pcr screen   Collection Time: 09/09/24  2:18 PM   Specimen: Nasal Mucosa; Nasal Swab  Result Value Ref Range   MRSA, PCR NEGATIVE NEGATIVE   Staphylococcus aureus NEGATIVE NEGATIVE      Assessment & Plan:   Problem List Items Addressed This Visit   None Visit Diagnoses       Preop exam for internal medicine    -  Primary   Has gotten cardiac clearance. Labs normal for pre-op. INR normal today. She is cleared for surgery. Will send paperwork to her orthopedist.  Relevant Orders   CoaguChek XS/INR Waived     Postoperative nausea and vomiting       Will make sure to tell her anesthesiologist.        Follow up plan: Return if symptoms worsen or fail to  improve.         [1]  Allergies Allergen Reactions   Amoxicillin  Diarrhea, Nausea And Vomiting and Shortness Of Breath   Shellfish Allergy Hives   Flexeril  [Cyclobenzaprine ] Hives    RAPID HEARTBEAT   Wound Dressing Adhesive Hives and Itching   Lyrica [Pregabalin] Palpitations    Also extreme drowsiness    "

## 2024-09-20 MED ORDER — POVIDONE-IODINE 10 % EX SWAB: 2.0000 | Freq: Once | CUTANEOUS | Status: DC

## 2024-09-20 MED ORDER — LACTATED RINGERS IV SOLN: INTRAVENOUS | Status: DC

## 2024-09-20 MED ORDER — CHLORHEXIDINE GLUCONATE 0.12 % MT SOLN
15.0000 mL | Freq: Once | OROMUCOSAL | Status: AC
  Administered 2024-09-21: 15 mL via OROMUCOSAL

## 2024-09-20 MED ORDER — DEXAMETHASONE SOD PHOSPHATE PF 10 MG/ML IJ SOLN
8.0000 mg | Freq: Once | INTRAMUSCULAR | Status: AC
  Administered 2024-09-21: 8 mg via INTRAVENOUS

## 2024-09-20 MED ORDER — ORAL CARE MOUTH RINSE: 15.0000 mL | Freq: Once | OROMUCOSAL | Status: AC

## 2024-09-20 MED ORDER — TRANEXAMIC ACID-NACL 1000-0.7 MG/100ML-% IV SOLN
1000.0000 mg | INTRAVENOUS | Status: AC
  Administered 2024-09-21: 1000 mg via INTRAVENOUS

## 2024-09-20 MED ORDER — CELECOXIB 200 MG PO CAPS
400.0000 mg | ORAL_CAPSULE | Freq: Once | ORAL | Status: AC
  Administered 2024-09-21: 400 mg via ORAL

## 2024-09-20 MED ORDER — CEFAZOLIN SODIUM-DEXTROSE 2-4 GM/100ML-% IV SOLN
2.0000 g | INTRAVENOUS | Status: AC
  Administered 2024-09-21: 2 g via INTRAVENOUS

## 2024-09-20 MED ORDER — ACETAMINOPHEN 500 MG PO TABS
1000.0000 mg | ORAL_TABLET | Freq: Once | ORAL | Status: AC
  Administered 2024-09-21: 1000 mg via ORAL

## 2024-09-20 MED ORDER — GABAPENTIN 300 MG PO CAPS
300.0000 mg | ORAL_CAPSULE | Freq: Once | ORAL | Status: AC
  Administered 2024-09-21: 300 mg via ORAL

## 2024-09-21 ENCOUNTER — Ambulatory Visit: Payer: Self-pay | Admitting: Urgent Care

## 2024-09-21 ENCOUNTER — Ambulatory Visit

## 2024-09-21 ENCOUNTER — Other Ambulatory Visit: Payer: Self-pay

## 2024-09-21 ENCOUNTER — Encounter: Admission: RE | Disposition: A | Discharge status: Home / Self Care | Payer: Self-pay | Source: Home / Self Care

## 2024-09-21 ENCOUNTER — Observation Stay
Admission: RE | Admit: 2024-09-21 | Discharge: 2024-09-21 | Disposition: A | Discharge status: Home / Self Care | Source: Home / Self Care

## 2024-09-21 DIAGNOSIS — Z96642 Presence of left artificial hip joint: Principal | ICD-10-CM

## 2024-09-21 DIAGNOSIS — M1612 Unilateral primary osteoarthritis, left hip: Secondary | ICD-10-CM

## 2024-09-21 MED ORDER — PROPOFOL 10 MG/ML IV BOLUS
INTRAVENOUS | Status: AC
  Filled 2024-09-21: qty 20

## 2024-09-21 MED ORDER — CEFAZOLIN SODIUM-DEXTROSE 2-4 GM/100ML-% IV SOLN
INTRAVENOUS | Status: AC
  Filled 2024-09-21: qty 100

## 2024-09-21 MED ORDER — BUPIVACAINE-EPINEPHRINE (PF) 0.5% -1:200000 IJ SOLN
INTRAMUSCULAR | Status: AC
  Filled 2024-09-21: qty 30

## 2024-09-21 MED ORDER — DEXAMETHASONE SOD PHOSPHATE PF 10 MG/ML IJ SOLN: 10.0000 mg | Freq: Once | INTRAMUSCULAR | Status: DC

## 2024-09-21 MED ORDER — PROPOFOL 1000 MG/100ML IV EMUL
INTRAVENOUS | Status: AC
  Filled 2024-09-21: qty 100

## 2024-09-21 MED ORDER — GLYCOPYRROLATE 0.2 MG/ML IJ SOLN
INTRAMUSCULAR | Status: DC | PRN
  Administered 2024-09-21: .2 mg via INTRAVENOUS

## 2024-09-21 MED ORDER — CELECOXIB 200 MG PO CAPS
200.0000 mg | ORAL_CAPSULE | Freq: Two times a day (BID) | ORAL | 0 refills | Status: AC
  Filled 2024-09-21: qty 60, 30d supply, fill #0

## 2024-09-21 MED ORDER — GLYCOPYRROLATE 0.2 MG/ML IJ SOLN
INTRAMUSCULAR | Status: AC
  Filled 2024-09-21: qty 1

## 2024-09-21 MED ORDER — PHENYLEPHRINE HCL-NACL 20-0.9 MG/250ML-% IV SOLN
INTRAVENOUS | Status: DC | PRN
  Administered 2024-09-21: 40 ug/min via INTRAVENOUS

## 2024-09-21 MED ORDER — TRANEXAMIC ACID-NACL 1000-0.7 MG/100ML-% IV SOLN
1000.0000 mg | Freq: Once | INTRAVENOUS | Status: AC
  Administered 2024-09-21: 1000 mg via INTRAVENOUS

## 2024-09-21 MED ORDER — CELECOXIB 200 MG PO CAPS
ORAL_CAPSULE | ORAL | Status: AC
  Filled 2024-09-21: qty 2

## 2024-09-21 MED ORDER — KETAMINE HCL 50 MG/5ML IJ SOSY
PREFILLED_SYRINGE | INTRAMUSCULAR | Status: AC
  Filled 2024-09-21: qty 5

## 2024-09-21 MED ORDER — HYDROMORPHONE HCL 1 MG/ML IJ SOLN
INTRAMUSCULAR | Status: AC
  Filled 2024-09-21: qty 1

## 2024-09-21 MED ORDER — SENNA 8.6 MG PO TABS: 1.0000 | ORAL_TABLET | Freq: Every day | ORAL | Status: DC

## 2024-09-21 MED ORDER — LACTATED RINGERS IV BOLUS
500.0000 mL | Freq: Once | INTRAVENOUS | Status: AC
  Administered 2024-09-21: 500 mL via INTRAVENOUS

## 2024-09-21 MED ORDER — ACETAMINOPHEN 500 MG PO TABS
ORAL_TABLET | ORAL | Status: AC
  Filled 2024-09-21: qty 2

## 2024-09-21 MED ORDER — SCOPOLAMINE 1 MG/3DAYS TD PT72
1.0000 | MEDICATED_PATCH | TRANSDERMAL | Status: DC
  Administered 2024-09-21: 1 mg via TRANSDERMAL

## 2024-09-21 MED ORDER — MIDAZOLAM HCL 2 MG/2ML IJ SOLN
INTRAMUSCULAR | Status: AC
  Filled 2024-09-21: qty 2

## 2024-09-21 MED ORDER — PHENYLEPHRINE HCL-NACL 20-0.9 MG/250ML-% IV SOLN
INTRAVENOUS | Status: AC
  Filled 2024-09-21: qty 250

## 2024-09-21 MED ORDER — CHLORHEXIDINE GLUCONATE 0.12 % MT SOLN
OROMUCOSAL | Status: AC
  Filled 2024-09-21: qty 15

## 2024-09-21 MED ORDER — MENTHOL 3 MG MT LOZG: 1.0000 | LOZENGE | OROMUCOSAL | Status: DC | PRN

## 2024-09-21 MED ORDER — DEXAMETHASONE SOD PHOSPHATE PF 10 MG/ML IJ SOLN
INTRAMUSCULAR | Status: AC
  Filled 2024-09-21: qty 1

## 2024-09-21 MED ORDER — MIDAZOLAM HCL (PF) 2 MG/2ML IJ SOLN
INTRAMUSCULAR | Status: DC | PRN
  Administered 2024-09-21: 2 mg via INTRAVENOUS

## 2024-09-21 MED ORDER — SENNOSIDES-DOCUSATE SODIUM 8.6-50 MG PO TABS: 1.0000 | ORAL_TABLET | Freq: Every evening | ORAL | Status: DC | PRN

## 2024-09-21 MED ORDER — OXYCODONE HCL 5 MG PO TABS
ORAL_TABLET | ORAL | Status: AC
  Filled 2024-09-21: qty 2

## 2024-09-21 MED ORDER — PHENYLEPHRINE 80 MCG/ML (10ML) SYRINGE FOR IV PUSH (FOR BLOOD PRESSURE SUPPORT)
PREFILLED_SYRINGE | INTRAVENOUS | Status: AC
  Filled 2024-09-21: qty 10

## 2024-09-21 MED ORDER — CEFAZOLIN SODIUM-DEXTROSE 2-4 GM/100ML-% IV SOLN
2.0000 g | Freq: Four times a day (QID) | INTRAVENOUS | Status: DC
  Administered 2024-09-21: 2 g via INTRAVENOUS

## 2024-09-21 MED ORDER — MIDAZOLAM HCL (PF) 2 MG/2ML IJ SOLN: 0.5000 mg | Freq: Once | INTRAMUSCULAR | Status: DC

## 2024-09-21 MED ORDER — ONDANSETRON HCL 4 MG/2ML IJ SOLN
INTRAMUSCULAR | Status: DC | PRN
  Administered 2024-09-21: 4 mg via INTRAVENOUS

## 2024-09-21 MED ORDER — LACTATED RINGERS IV BOLUS
250.0000 mL | Freq: Once | INTRAVENOUS | Status: AC
  Administered 2024-09-21: 250 mL via INTRAVENOUS

## 2024-09-21 MED ORDER — PROPOFOL 10 MG/ML IV BOLUS
INTRAVENOUS | Status: DC | PRN
  Administered 2024-09-21: 120 mg via INTRAVENOUS

## 2024-09-21 MED ORDER — SODIUM CHLORIDE 0.9 % IR SOLN
Status: DC | PRN
  Administered 2024-09-21: 3000 mL

## 2024-09-21 MED ORDER — SODIUM CHLORIDE (PF) 0.9 % IJ SOLN
INTRAMUSCULAR | Status: DC | PRN
  Administered 2024-09-21: 40 mL via INTRAMUSCULAR

## 2024-09-21 MED ORDER — TRANEXAMIC ACID-NACL 1000-0.7 MG/100ML-% IV SOLN
INTRAVENOUS | Status: AC
  Filled 2024-09-21: qty 100

## 2024-09-21 MED ORDER — GABAPENTIN 300 MG PO CAPS
ORAL_CAPSULE | ORAL | Status: AC
  Filled 2024-09-21: qty 1

## 2024-09-21 MED ORDER — SODIUM CHLORIDE (PF) 0.9 % IJ SOLN
INTRAMUSCULAR | Status: AC
  Filled 2024-09-21: qty 30

## 2024-09-21 MED ORDER — LACTATED RINGERS IV SOLN: INTRAVENOUS | Status: DC | PRN

## 2024-09-21 MED ORDER — HYDROMORPHONE HCL 1 MG/ML IJ SOLN
0.2500 mg | INTRAMUSCULAR | Status: DC | PRN
  Administered 2024-09-21 (×4): 0.5 mg via INTRAVENOUS

## 2024-09-21 MED ORDER — ACETAMINOPHEN 500 MG PO TABS
1000.0000 mg | ORAL_TABLET | Freq: Four times a day (QID) | ORAL | Status: DC
  Administered 2024-09-21: 1000 mg via ORAL

## 2024-09-21 MED ORDER — CELECOXIB 200 MG PO CAPS
200.0000 mg | ORAL_CAPSULE | Freq: Two times a day (BID) | ORAL | Status: DC
  Filled 2024-09-21 (×3): qty 1

## 2024-09-21 MED ORDER — METOCLOPRAMIDE HCL 5 MG PO TABS: 5.0000 mg | ORAL_TABLET | Freq: Three times a day (TID) | ORAL | Status: DC | PRN

## 2024-09-21 MED ORDER — METOCLOPRAMIDE HCL 5 MG/ML IJ SOLN: 5.0000 mg | Freq: Three times a day (TID) | INTRAMUSCULAR | Status: DC | PRN

## 2024-09-21 MED ORDER — ASPIRIN 81 MG PO CHEW: 81.0000 mg | CHEWABLE_TABLET | Freq: Two times a day (BID) | ORAL | Status: DC

## 2024-09-21 MED ORDER — PHENOL 1.4 % MT LIQD: 1.0000 | OROMUCOSAL | Status: DC | PRN

## 2024-09-21 MED ORDER — ACETAMINOPHEN 500 MG PO TABS
1000.0000 mg | ORAL_TABLET | Freq: Three times a day (TID) | ORAL | 0 refills | Status: AC
  Filled 2024-09-21: qty 120, 20d supply, fill #0

## 2024-09-21 MED ORDER — LIDOCAINE HCL (PF) 2 % IJ SOLN
INTRAMUSCULAR | Status: AC
  Filled 2024-09-21: qty 5

## 2024-09-21 MED ORDER — FENTANYL CITRATE (PF) 100 MCG/2ML IJ SOLN
INTRAMUSCULAR | Status: DC | PRN
  Administered 2024-09-21 (×2): 50 ug via INTRAVENOUS

## 2024-09-21 MED ORDER — MEPERIDINE HCL 25 MG/ML IJ SOLN: 6.2500 mg | INTRAMUSCULAR | Status: DC | PRN

## 2024-09-21 MED ORDER — SCOPOLAMINE 1 MG/3DAYS TD PT72
MEDICATED_PATCH | TRANSDERMAL | Status: AC
  Filled 2024-09-21: qty 1

## 2024-09-21 MED ORDER — ONDANSETRON HCL 4 MG PO TABS: 4.0000 mg | ORAL_TABLET | Freq: Four times a day (QID) | ORAL | Status: DC | PRN

## 2024-09-21 MED ORDER — ROCURONIUM BROMIDE 10 MG/ML (PF) SYRINGE
PREFILLED_SYRINGE | INTRAVENOUS | Status: AC
  Filled 2024-09-21: qty 10

## 2024-09-21 MED ORDER — ASPIRIN 81 MG PO CHEW
81.0000 mg | CHEWABLE_TABLET | Freq: Two times a day (BID) | ORAL | 0 refills | Status: AC
  Filled 2024-09-21: qty 60, 30d supply, fill #0

## 2024-09-21 MED ORDER — DEXAMETHASONE SOD PHOSPHATE PF 10 MG/ML IJ SOLN
INTRAMUSCULAR | Status: DC | PRN
  Administered 2024-09-21: 10 mg via INTRAVENOUS

## 2024-09-21 MED ORDER — ACETAMINOPHEN 325 MG PO TABS: 325.0000 mg | ORAL_TABLET | Freq: Four times a day (QID) | ORAL | Status: DC | PRN

## 2024-09-21 MED ORDER — ONDANSETRON HCL 4 MG/2ML IJ SOLN
INTRAMUSCULAR | Status: AC
  Filled 2024-09-21: qty 2

## 2024-09-21 MED ORDER — PHENYLEPHRINE 80 MCG/ML (10ML) SYRINGE FOR IV PUSH (FOR BLOOD PRESSURE SUPPORT)
PREFILLED_SYRINGE | INTRAVENOUS | Status: DC | PRN
  Administered 2024-09-21: 160 ug via INTRAVENOUS
  Administered 2024-09-21 (×3): 80 ug via INTRAVENOUS

## 2024-09-21 MED ORDER — HYDROCODONE-ACETAMINOPHEN 7.5-325 MG PO TABS: 1.0000 | ORAL_TABLET | Freq: Once | ORAL | Status: DC | PRN

## 2024-09-21 MED ORDER — ROCURONIUM BROMIDE 100 MG/10ML IV SOLN
INTRAVENOUS | Status: DC | PRN
  Administered 2024-09-21: 70 mg via INTRAVENOUS
  Administered 2024-09-21: 10 mg via INTRAVENOUS

## 2024-09-21 MED ORDER — LIDOCAINE HCL (CARDIAC) PF 100 MG/5ML IV SOSY
PREFILLED_SYRINGE | INTRAVENOUS | Status: DC | PRN
  Administered 2024-09-21: 100 mg via INTRAVENOUS

## 2024-09-21 MED ORDER — SUGAMMADEX SODIUM 200 MG/2ML IV SOLN
INTRAVENOUS | Status: DC | PRN
  Administered 2024-09-21: 160 mg via INTRAVENOUS

## 2024-09-21 MED ORDER — FENTANYL CITRATE (PF) 100 MCG/2ML IJ SOLN
INTRAMUSCULAR | Status: AC
  Filled 2024-09-21: qty 2

## 2024-09-21 MED ORDER — OXYCODONE HCL 5 MG PO TABS
5.0000 mg | ORAL_TABLET | ORAL | Status: DC | PRN
  Administered 2024-09-21: 10 mg via ORAL

## 2024-09-21 MED ORDER — ONDANSETRON HCL 4 MG/2ML IJ SOLN: 4.0000 mg | Freq: Four times a day (QID) | INTRAMUSCULAR | Status: DC | PRN

## 2024-09-21 MED ORDER — KETAMINE HCL 50 MG/5ML IJ SOSY
PREFILLED_SYRINGE | INTRAMUSCULAR | Status: DC | PRN
  Administered 2024-09-21: 10 mg via INTRAVENOUS
  Administered 2024-09-21: 20 mg via INTRAVENOUS

## 2024-09-21 MED ORDER — 0.9 % SODIUM CHLORIDE (POUR BTL) OPTIME
TOPICAL | Status: DC | PRN
  Administered 2024-09-21: 1000 mL

## 2024-09-21 MED ORDER — HYDROMORPHONE HCL 1 MG/ML IJ SOLN
INTRAMUSCULAR | Status: DC | PRN
  Administered 2024-09-21 (×2): .5 mg via INTRAVENOUS

## 2024-09-21 NOTE — TOC CM/SW Note (Signed)
 Patient has a mobility impairment that requires a rolling walker for dialy activities, they are safe to use this.

## 2024-09-21 NOTE — Transfer of Care (Signed)
 Immediate Anesthesia Transfer of Care Note  Patient: Jamie [name]  Procedure(s) Performed: ARTHROPLASTY, HIP, TOTAL, ANTERIOR APPROACH (Left: Hip)  Patient Location: PACU  Anesthesia Type:General  Level of Consciousness: awake, alert , and drowsy  Airway & Oxygen Therapy: Patient Spontanous Breathing and Patient connected to face mask oxygen  Post-op Assessment: Report given to RN and Post -op Vital signs reviewed and stable  Post vital signs: Reviewed and stable  Last Vitals:  Vitals Value Taken Time  BP 106/67 1007  Temp 36.1 C 09/21/24 10:07  Pulse 79 09/21/24 10:12  Resp 13 09/21/24 10:12  SpO2 100 % 09/21/24 10:12  Vitals shown include unfiled device data.  Last Pain:  Vitals:   09/21/24 0630  TempSrc: Temporal  PainSc: 7          Complications: No notable events documented.

## 2024-09-21 NOTE — Evaluation (Signed)
 Physical Therapy Evaluation Patient Details Name: Jamie [name] MRN: [id number] DOB: August 22, 1970 Today's Date: 09/21/2024  History of Present Illness  Patient is a 54 year old female with left hip arthritis s/p left total hip arthroplasty. PMH prior back surgery  Clinical Impression  Patient is agreeable to PT evaluation and eager to go home today. She lives with and takes care of her parents. She is independent at baseline.  Mobility initiated today. No dizziness reported with activity. Gait training in hallway with rolling walker with reinforcement of correct technique. Stair training completed without difficulty. She does report pain in the left hip but no significant increase with walking reported. Mobility is adequate for discharge home with family support. PT will continue to follow while in the hospital if patient does not discharge as anticipated.     If plan is discharge home, recommend the following: Assist for transportation   Can travel by private vehicle        Equipment Recommendations None recommended by PT (patient reports she has DME in place already)  Recommendations for Other Services       Functional Status Assessment Patient has had a recent decline in their functional status and demonstrates the ability to make significant improvements in function in a reasonable and predictable amount of time.     Precautions / Restrictions Precautions Precautions: Anterior Hip;Fall Precaution Booklet Issued: Yes (comment) Recall of Precautions/Restrictions: Intact Restrictions Weight Bearing Restrictions Per Provider Order: Yes LLE Weight Bearing Per Provider Order: Weight bearing as tolerated      Mobility  Bed Mobility Overal bed mobility: Needs Assistance Bed Mobility: Supine to Sit, Sit to Supine     Supine to sit: Supervision Sit to supine: Min assist   General bed mobility comments: assistance for LLE support to get back to bed    Transfers Overall transfer  level: Needs assistance Equipment used: Rolling walker (2 wheels) Transfers: Sit to/from Stand Sit to Stand: Supervision           General transfer comment: for standing from bed and from toilet    Ambulation/Gait Ambulation/Gait assistance: Contact guard assist, Supervision Gait Distance (Feet): 125 Feet Assistive device: Rolling walker (2 wheels) Gait Pattern/deviations: Step-through pattern, Decreased stride length, Decreased stance time - left Gait velocity: decreased     General Gait Details: education for positioning of rolling walker, sequencing of BLE. no loss of balance using rolling walker for support. increasing gait speed with increased walking distance  Stairs Stairs: Yes Stairs assistance: Supervision Stair Management: Two rails, Step to pattern, Forwards Number of Stairs: 4 General stair comments: cues for sequencing of BLE. correct technique demonstrated without difficulty  Wheelchair Mobility     Tilt Bed    Modified Rankin (Stroke Patients Only)       Balance Overall balance assessment: Needs assistance Sitting-balance support: Feet supported Sitting balance-Leahy Scale: Good     Standing balance support: No upper extremity supported Standing balance-Leahy Scale: Fair Standing balance comment: standing to wash hands at sink without UE support with no loss of balance                             Pertinent Vitals/Pain Pain Assessment Pain Assessment: Faces Faces Pain Scale: Hurts little more Pain Location: R hip with movement Pain Descriptors / Indicators: Discomfort Pain Intervention(s): Monitored during session, Repositioned, Limited activity within patient's tolerance (encouraged ice pack)    Home Living Family/patient expects to be discharged to::  Private residence Living Arrangements: Parent Available Help at Discharge: Family;Available 24 hours/day Type of Home: House Home Access: Stairs to enter Entrance Stairs-Rails:  Lawyer of Steps: 3 Alternate Level Stairs-Number of Steps: flight Home Layout: Two level;Bed/bath upstairs Home Equipment: Agricultural Consultant (2 wheels);Rollator (4 wheels);Shower seat;Adaptive equipment      Prior Function Prior Level of Function : Independent/Modified Independent                     Extremity/Trunk Assessment   Upper Extremity Assessment Upper Extremity Assessment: Overall WFL for tasks assessed    Lower Extremity Assessment Lower Extremity Assessment: LLE deficits/detail LLE Deficits / Details: patient can activate hip, knee, ankle movement. weight bearing without knee buckling. can complete LAQ while sitting       Communication   Communication Communication: No apparent difficulties    Cognition Arousal: Alert Behavior During Therapy: WFL for tasks assessed/performed   PT - Cognitive impairments: No apparent impairments                         Following commands: Intact       Cueing Cueing Techniques: Verbal cues     General Comments General comments (skin integrity, edema, etc.): patient was able to urinate and perform all pericare without assistance    Exercises     Assessment/Plan    PT Assessment Patient needs continued PT services  PT Problem List Decreased range of motion;Decreased strength;Decreased activity tolerance;Decreased balance;Decreased mobility;Pain       PT Treatment Interventions DME instruction;Gait training;Stair training;Functional mobility training;Therapeutic activities;Therapeutic exercise;Patient/family education    PT Goals (Current goals can be found in the Care Plan section)  Acute Rehab PT Goals Patient Stated Goal: home today PT Goal Formulation: With patient Time For Goal Achievement: 10/05/24 Potential to Achieve Goals: Good    Frequency BID     Co-evaluation               AM-PAC PT 6 Clicks Mobility  Outcome Measure Help needed turning from your  back to your side while in a flat bed without using bedrails?: None Help needed moving from lying on your back to sitting on the side of a flat bed without using bedrails?: A Little Help needed moving to and from a bed to a chair (including a wheelchair)?: A Little Help needed standing up from a chair using your arms (e.g., wheelchair or bedside chair)?: A Little Help needed to walk in hospital room?: A Little Help needed climbing 3-5 steps with a railing? : A Little 6 Click Score: 19    End of Session Equipment Utilized During Treatment: Gait belt Activity Tolerance: Patient tolerated treatment well Patient left: in bed;with call bell/phone within reach;with family/visitor present;with nursing/sitter in room Nurse Communication: Mobility status PT Visit Diagnosis: Difficulty in walking, not elsewhere classified (R26.2);Other abnormalities of gait and mobility (R26.89)    Time: 1340-1417 PT Time Calculation (min) (ACUTE ONLY): 37 min   Charges:   PT Evaluation $PT Eval Low Complexity: 1 Low PT Treatments $Gait Training: 8-22 mins PT General Charges $$ ACUTE PT VISIT: 1 Visit         Randine Essex, PT, MPT   Randine LULLA Essex 09/21/2024, 2:32 PM

## 2024-09-21 NOTE — Anesthesia Preprocedure Evaluation (Addendum)
"                                    Anesthesia Evaluation  Patient identified by MRN, date of birth, ID band Patient awake    Reviewed: Allergy & Precautions, H&P , NPO status , Patient's Chart, lab work & pertinent test results, reviewed documented beta blocker date and time   History of Anesthesia Complications (+) PONV, Family history of anesthesia reaction and history of anesthetic complications  Airway Mallampati: III  TM Distance: >3 FB Neck ROM: Full    Dental no notable dental hx. (+) Teeth Intact   Pulmonary neg pulmonary ROS, Current Smoker and Patient abstained from smoking.   Pulmonary exam normal breath sounds clear to auscultation       Cardiovascular Exercise Tolerance: Good negative cardio ROS Normal cardiovascular exam Rhythm:Regular Rate:Normal     Neuro/Psych  Headaches PSYCHIATRIC DISORDERS Anxiety Depression    negative neurological ROS  negative psych ROS   GI/Hepatic negative GI ROS, Neg liver ROS,,,  Endo/Other  negative endocrine ROS    Renal/GU negative Renal ROS  negative genitourinary   Musculoskeletal  (+) Arthritis ,    Abdominal  (+)  Abdomen: soft.   Peds  Hematology negative hematology ROS (+)   Anesthesia Other Findings   Reproductive/Obstetrics negative OB ROS                              Anesthesia Physical Anesthesia Plan  ASA: 2  Anesthesia Plan: General   Post-op Pain Management: Ofirmev  IV (intra-op)* and Gabapentin  PO (pre-op)*   Induction: Intravenous  PONV Risk Score and Plan: 3 and Scopolamine  patch - Pre-op, Ondansetron  and Dexamethasone   Airway Management Planned: Oral ETT  Additional Equipment:   Intra-op Plan:   Post-operative Plan:   Informed Consent: I have reviewed the patients History and Physical, chart, labs and discussed the procedure including the risks, benefits and alternatives for the proposed anesthesia with the patient or authorized  representative who has indicated his/her understanding and acceptance.     Dental Advisory Given  Plan Discussed with: CRNA  Anesthesia Plan Comments:          Anesthesia Quick Evaluation  "

## 2024-09-21 NOTE — TOC Initial Note (Signed)
 Transition of Care [facility]) - Initial/Assessment Note    Patient Details  Name: Jamie [name] MRN: [id number] Date of Birth: [birth year]  Transition of [facility]) CM/SW Contact:    Nathanael CHRISTELLA Ring, RN Phone Number: 09/21/2024, 1:45 PM  Clinical Narrative:                 RW ordered from [employer] to be delivered to the bedside .  From Pre Op appointment with ortho is appears that patient has been referred for outpatient physical therapy post op.         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] Patient Active Problem List   Diagnosis Date Noted   Status post hip replacement, left 09/21/2024   Mixed hyperlipidemia 08/21/2024   Generalized headaches 02/19/2024   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   S/P lumbar fusion 03/11/2023   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, 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   Recurrent major depression-severe (HCC) 10/19/2020   GAD (generalized anxiety disorder) 04/26/2020   Panic attack 04/26/2020   At risk for long QT syndrome 04/26/2020   DDD (degenerative disc disease), lumbar 05/12/2019   Allergic rhinitis 05/11/2019   Primary osteoarthritis of right knee 03/20/2018   Insomnia 08/24/2017   Chronic back pain 10/16/2016   Pseudoarthrosis of lumbar spine 08/11/2015   Eczema 05/09/2015   Spinal stenosis of lumbar region 09/01/2014   Spondylolisthesis 08/03/2014   Displacement of lumbar intervertebral disc without myelopathy 08/03/2014   PCP:  Vicci Duwaine SQUIBB, DO Pharmacy:   [facility] [address] [place] [address] Phone: [phone] Fax: [phone]  [facility] - [place], [place] - [address] [place] [address] Phone: [phone] Fax: [phone]     Social Drivers of Health (SDOH) Social History: SDOH Screenings   Food Insecurity: No Food Insecurity (07/07/2024)  Housing: Low Risk (07/07/2024)  Transportation Needs: No Transportation Needs (07/07/2024)  Utilities: Not At Risk (11/11/2023)   Received from [facility]  Alcohol Screen: Low Risk (04/15/2024)  Depression (PHQ2-9): High Risk (08/21/2024)  Financial Resource Strain: Patient Declined (07/07/2024)  Physical Activity: Insufficiently Active (07/07/2024)  Social Connections: Moderately Isolated (07/07/2024)  Stress: Stress Concern Present (07/07/2024)  Tobacco Use: High Risk (09/21/2024)  Health Literacy: Adequate Health Literacy (04/15/2024)   SDOH Interventions:     Readmission Risk Interventions     No data to display

## 2024-09-21 NOTE — Interval H&P Note (Signed)
 History and Physical Interval Note:  09/21/2024 7:18 AM  Jamie [name]  has presented today for surgery, with the diagnosis of Osteoarthritis Left Hip.  The various methods of treatment have been discussed with the patient and family. After consideration of risks, benefits and other options for treatment, the patient has consented to  Procedures: ARTHROPLASTY, HIP, TOTAL, ANTERIOR APPROACH (Left) as a surgical intervention.  The patient's history has been reviewed, patient examined, no change in status, stable for surgery.  I have reviewed the patient's chart and labs.  Questions were answered to the patient's satisfaction.     Fernando Torry S Daegen Berrocal

## 2024-09-21 NOTE — Progress Notes (Signed)
 Patient met PT requirements. Antibiotics administered as ordered. Per Dr. Mariah patient okay for discharge.

## 2024-09-22 ENCOUNTER — Telehealth: Payer: Self-pay

## 2024-09-23 ENCOUNTER — Other Ambulatory Visit: Payer: Self-pay

## 2024-09-23 NOTE — Telephone Encounter (Signed)
 I called and checked on pt. She is still having painful locking cramps in her thigh. Please advise

## 2024-09-23 NOTE — Telephone Encounter (Signed)
 Called patient. Continued left leg muscle cramping; anterior/posterior thigh and buttocks. More prevalent with movement; such as moving from lying position to the recliner. Denies leg erythema or edema. No palpable warmth. Denies calf pain.  Patient using ice, heat, and muscle relaxer. On MS Contin , ASA 81mg  bid, Celebrex  200mg  bid, and using Tylenol  for pain management. Patient on long term pain management, states she is due for a refill of her roxicodone  tomorrow.  Advised patient to continue what she is doing. Massage the area. Starting PT next week. Gentle stretching. Keep the [place] office updated on her progress. Will check-in tomorrow to see if feeling better with a little more time and roxicodone .

## 2024-09-24 ENCOUNTER — Other Ambulatory Visit: Payer: Self-pay

## 2024-09-24 ENCOUNTER — Ambulatory Visit: Admitting: Orthopedic Surgery

## 2024-09-24 NOTE — Telephone Encounter (Signed)
 Called patient. Continuing to have cramps. Feels she's tried everything with no relief; water, gatorade, mustard, apple cider vinegar pills, heat, cold, massage, muscle relaxer,   Got oxycodone  IR filled today. Helping with pain. Not resolving cramping.  Patient continues to report no leg edema, erythema, tightness, warmth. Discussed possibility of DVT; limited risk given none of the aforementioned symptoms, but low threshold to order a LLE ultrasound.  First PT appointment next week; 09/30/2024.  Offered patient an appointment in the office tomorrow; check leg, incision, etc. Patient doesn't think is necessary at this time.  Will call patient again tomorrow for check-in prior to the weekend and see how she is feeling.

## 2024-09-25 NOTE — Telephone Encounter (Signed)
 Called patient. No answer. LVM. Stated hoped patient was feeling better. Advised if any questions/concerns, to call the office back.

## 2024-09-28 ENCOUNTER — Ambulatory Visit: Admitting: Family Medicine

## 2024-09-30 ENCOUNTER — Ambulatory Visit

## 2024-10-02 ENCOUNTER — Encounter

## 2024-10-05 ENCOUNTER — Ambulatory Visit: Admitting: Psychiatry

## 2024-10-05 ENCOUNTER — Encounter: Admitting: Physician Assistant

## 2024-10-05 ENCOUNTER — Ambulatory Visit

## 2024-10-07 ENCOUNTER — Ambulatory Visit

## 2024-10-12 ENCOUNTER — Ambulatory Visit

## 2024-10-14 ENCOUNTER — Ambulatory Visit

## 2024-10-19 ENCOUNTER — Ambulatory Visit

## 2024-10-21 ENCOUNTER — Ambulatory Visit

## 2024-10-26 ENCOUNTER — Ambulatory Visit

## 2024-10-28 ENCOUNTER — Ambulatory Visit

## 2024-11-02 ENCOUNTER — Ambulatory Visit

## 2024-11-04 ENCOUNTER — Ambulatory Visit

## 2024-11-09 ENCOUNTER — Ambulatory Visit

## 2024-11-11 ENCOUNTER — Ambulatory Visit

## 2024-11-16 ENCOUNTER — Ambulatory Visit

## 2024-11-18 ENCOUNTER — Ambulatory Visit

## 2024-11-24 ENCOUNTER — Ambulatory Visit: Admitting: Physician Assistant

## 2025-02-09 ENCOUNTER — Ambulatory Visit: Admitting: Dermatology

## 2025-04-16 ENCOUNTER — Encounter: Admitting: Family Medicine
